# Patient Record
Sex: Female | Born: 1940 | Race: White | Hispanic: No | Marital: Married | Smoking: Never smoker
Health system: Southern US, Community
[De-identification: ages and names within clinical notes are randomized; demographics above are authoritative.]

## PROBLEM LIST (undated history)

## (undated) DIAGNOSIS — K648 Other hemorrhoids: Secondary | ICD-10-CM

## (undated) DIAGNOSIS — K219 Gastro-esophageal reflux disease without esophagitis: Secondary | ICD-10-CM

## (undated) DIAGNOSIS — G43909 Migraine, unspecified, not intractable, without status migrainosus: Secondary | ICD-10-CM

## (undated) DIAGNOSIS — I4819 Other persistent atrial fibrillation: Secondary | ICD-10-CM

## (undated) DIAGNOSIS — G4700 Insomnia, unspecified: Secondary | ICD-10-CM

## (undated) DIAGNOSIS — M5090 Cervical disc disorder, unspecified, unspecified cervical region: Secondary | ICD-10-CM

## (undated) DIAGNOSIS — K5790 Diverticulosis of intestine, part unspecified, without perforation or abscess without bleeding: Secondary | ICD-10-CM

## (undated) DIAGNOSIS — I639 Cerebral infarction, unspecified: Secondary | ICD-10-CM

## (undated) DIAGNOSIS — F329 Major depressive disorder, single episode, unspecified: Secondary | ICD-10-CM

## (undated) DIAGNOSIS — G4730 Sleep apnea, unspecified: Secondary | ICD-10-CM

## (undated) DIAGNOSIS — M353 Polymyalgia rheumatica: Secondary | ICD-10-CM

## (undated) DIAGNOSIS — E785 Hyperlipidemia, unspecified: Secondary | ICD-10-CM

## (undated) DIAGNOSIS — E2740 Unspecified adrenocortical insufficiency: Secondary | ICD-10-CM

## (undated) DIAGNOSIS — F32A Depression, unspecified: Secondary | ICD-10-CM

## (undated) DIAGNOSIS — D126 Benign neoplasm of colon, unspecified: Secondary | ICD-10-CM

## (undated) DIAGNOSIS — I10 Essential (primary) hypertension: Secondary | ICD-10-CM

## (undated) DIAGNOSIS — M1990 Unspecified osteoarthritis, unspecified site: Secondary | ICD-10-CM

## (undated) HISTORY — DX: Essential (primary) hypertension: I10

## (undated) HISTORY — PX: GLAUCOMA SURGERY: SHX656

## (undated) HISTORY — DX: Other hemorrhoids: K64.8

## (undated) HISTORY — DX: Migraine, unspecified, not intractable, without status migrainosus: G43.909

## (undated) HISTORY — PX: TONSILLECTOMY: SUR1361

## (undated) HISTORY — DX: Other persistent atrial fibrillation: I48.19

## (undated) HISTORY — DX: Unspecified adrenocortical insufficiency: E27.40

## (undated) HISTORY — DX: Cervical disc disorder, unspecified, unspecified cervical region: M50.90

## (undated) HISTORY — DX: Polymyalgia rheumatica: M35.3

## (undated) HISTORY — DX: Sleep apnea, unspecified: G47.30

## (undated) HISTORY — DX: Insomnia, unspecified: G47.00

## (undated) HISTORY — DX: Depression, unspecified: F32.A

## (undated) HISTORY — PX: BREAST LUMPECTOMY: SHX2

## (undated) HISTORY — DX: Unspecified osteoarthritis, unspecified site: M19.90

## (undated) HISTORY — DX: Gastro-esophageal reflux disease without esophagitis: K21.9

## (undated) HISTORY — DX: Diverticulosis of intestine, part unspecified, without perforation or abscess without bleeding: K57.90

## (undated) HISTORY — DX: Major depressive disorder, single episode, unspecified: F32.9

## (undated) HISTORY — DX: Benign neoplasm of colon, unspecified: D12.6

---

## 1998-06-25 ENCOUNTER — Ambulatory Visit (HOSPITAL_COMMUNITY): Admission: RE | Admit: 1998-06-25 | Discharge: 1998-06-25 | Payer: Self-pay | Admitting: Obstetrics & Gynecology

## 1999-09-13 ENCOUNTER — Other Ambulatory Visit: Admission: RE | Admit: 1999-09-13 | Discharge: 1999-09-13 | Payer: Self-pay | Admitting: Cardiology

## 2000-12-05 ENCOUNTER — Encounter: Payer: Self-pay | Admitting: Internal Medicine

## 2000-12-05 ENCOUNTER — Encounter: Admission: RE | Admit: 2000-12-05 | Discharge: 2000-12-05 | Payer: Self-pay | Admitting: Internal Medicine

## 2001-01-02 ENCOUNTER — Other Ambulatory Visit: Admission: RE | Admit: 2001-01-02 | Discharge: 2001-01-02 | Payer: Self-pay | Admitting: Internal Medicine

## 2002-08-15 ENCOUNTER — Ambulatory Visit (HOSPITAL_COMMUNITY): Admission: RE | Admit: 2002-08-15 | Discharge: 2002-08-15 | Payer: Self-pay | Admitting: Vascular Surgery

## 2002-08-15 ENCOUNTER — Encounter: Payer: Self-pay | Admitting: Vascular Surgery

## 2002-08-15 ENCOUNTER — Encounter (INDEPENDENT_AMBULATORY_CARE_PROVIDER_SITE_OTHER): Payer: Self-pay | Admitting: *Deleted

## 2003-02-13 ENCOUNTER — Encounter: Admission: RE | Admit: 2003-02-13 | Discharge: 2003-02-13 | Payer: Self-pay | Admitting: Internal Medicine

## 2003-02-13 ENCOUNTER — Encounter: Payer: Self-pay | Admitting: Internal Medicine

## 2005-09-19 ENCOUNTER — Encounter: Admission: RE | Admit: 2005-09-19 | Discharge: 2005-09-19 | Payer: Self-pay | Admitting: Rheumatology

## 2005-10-03 ENCOUNTER — Encounter: Admission: RE | Admit: 2005-10-03 | Discharge: 2005-10-03 | Payer: Self-pay | Admitting: Rheumatology

## 2007-03-27 ENCOUNTER — Ambulatory Visit: Payer: Self-pay | Admitting: Gastroenterology

## 2007-04-01 DIAGNOSIS — D126 Benign neoplasm of colon, unspecified: Secondary | ICD-10-CM

## 2007-04-01 HISTORY — DX: Benign neoplasm of colon, unspecified: D12.6

## 2007-04-10 ENCOUNTER — Ambulatory Visit: Payer: Self-pay | Admitting: Gastroenterology

## 2007-04-10 ENCOUNTER — Encounter: Payer: Self-pay | Admitting: Gastroenterology

## 2009-04-02 ENCOUNTER — Encounter: Admission: RE | Admit: 2009-04-02 | Discharge: 2009-04-02 | Payer: Self-pay | Admitting: Internal Medicine

## 2010-09-01 ENCOUNTER — Encounter: Admission: RE | Admit: 2010-09-01 | Discharge: 2010-09-01 | Payer: Self-pay | Admitting: Internal Medicine

## 2011-01-06 ENCOUNTER — Encounter: Payer: Self-pay | Admitting: Internal Medicine

## 2011-02-18 ENCOUNTER — Encounter: Payer: Self-pay | Admitting: Cardiology

## 2011-02-18 DIAGNOSIS — I10 Essential (primary) hypertension: Secondary | ICD-10-CM

## 2011-02-18 DIAGNOSIS — M353 Polymyalgia rheumatica: Secondary | ICD-10-CM

## 2011-02-23 ENCOUNTER — Encounter: Payer: Self-pay | Admitting: Cardiology

## 2011-02-23 ENCOUNTER — Ambulatory Visit (INDEPENDENT_AMBULATORY_CARE_PROVIDER_SITE_OTHER): Payer: Medicare Other | Admitting: Cardiology

## 2011-02-23 VITALS — BP 140/82 | HR 76 | Ht 63.0 in | Wt 126.8 lb

## 2011-02-23 DIAGNOSIS — I10 Essential (primary) hypertension: Secondary | ICD-10-CM

## 2011-02-23 DIAGNOSIS — R002 Palpitations: Secondary | ICD-10-CM

## 2011-02-23 NOTE — Assessment & Plan Note (Signed)
Symptoms of intermittent tachycardia. Need to rule out SVT or atrial fibrillation. She does history of PACs noted on prior stress testing the symptoms sound different. I've encouraged her to avoid stimulants. We will have her wear an event monitor and followup again in one month.

## 2011-02-23 NOTE — Assessment & Plan Note (Signed)
Blood pressure is well controlled currently. We'll continue with her current therapy. She is already on atenolol which should help her palpitations.

## 2011-02-23 NOTE — Progress Notes (Signed)
Lorraine Little Date of Birth: 07/28/1941   History of Present Illness: Lorraine Little is seen at the request of Dr. Waynard Edwards for evaluation of palpitations. She states the first time she noticed the symptoms she had been having a bad headache and was taking a lot of Excedrin. She stopped taking the Excedrin but continued to have symptoms of palpitations which she describes as her heart racing. Her blood pressures also been higher at this time. Since December these episodes have been sporadic occurring only about 4 times but lasting as long as 3 hours. She was started on HCTZ for her blood pressure and for swelling in her leg. Lorraine Little was evaluated here previously in 2009. She had a normal stress echo at that time.  Current Outpatient Prescriptions on File Prior to Visit  Medication Sig Dispense Refill  . alendronate (FOSAMAX) 70 MG tablet Take 70 mg by mouth every 7 (seven) days. Take with a full glass of water on an empty stomach.       Marland Kitchen aspirin 81 MG tablet Take 81 mg by mouth daily.        Marland Kitchen atenolol (TENORMIN) 50 MG tablet Take 50 mg by mouth daily.        . Calcium Carbonate-Vitamin D (CALCIUM + D) 600-200 MG-UNIT TABS Take by mouth.        . fish oil-omega-3 fatty acids 1000 MG capsule Take 1,200 mg by mouth 2 (two) times daily.       Marland Kitchen gabapentin (NEURONTIN) 100 MG capsule Take 100 mg by mouth 2 (two) times daily.        . predniSONE (DELTASONE) 2.5 MG tablet Take 4 mg by mouth daily.       . Sennosides (SENNA LAX PO) Take by mouth daily.        . temazepam (RESTORIL) 15 MG capsule Take 15 mg by mouth daily.        Marland Kitchen triamterene-hydrochlorothiazide (DYAZIDE) 37.5-25 MG per capsule Take 1 capsule by mouth every morning. Takes 1/2 tablet daily       . escitalopram (LEXAPRO) 5 MG tablet Take 5 mg by mouth daily.        . pantoprazole (PROTONIX) 40 MG tablet Take 40 mg by mouth daily.        . risedronate (ACTONEL) 150 MG tablet Take 150 mg by mouth every 30 (thirty) days. with water on  empty stomach, nothing by mouth or lie down for next 30 minutes.         No Known Allergies  Past Medical History  Diagnosis Date  . Polymyalgia rheumatica   . Hypertension   . GERD (gastroesophageal reflux disease)   . Premature atrial contraction   . Migraine headache   . Cervical disc disease   . Osteoporosis   . Glaucoma   . Osteoarthritis     Past Surgical History  Procedure Date  . Lympectomy 1980's    right  x2  . Tonsillectomy age 19    History  Smoking status  . Never Smoker   Smokeless tobacco  . Not on file    History  Alcohol Use  . Yes    SOCIAL    Family History  Problem Relation Age of Onset  . Stroke Mother   . Cancer Father     Review of Systems: The review of systems is positive for palpitations.  She has had mild left lower extremity edema. She denies any dizziness, syncope, shortness of breath, or chest  pain.All other systems were reviewed and are negative.  Physical Exam: BP 140/82  Pulse 76  Ht 5\' 3"  (1.6 m)  Wt 126 lb 12.8 oz (57.516 kg)  BMI 22.46 kg/m2 The patient is alert and oriented x 3.  The mood and affect are normal.  The skin is warm and dry.  Color is normal.  The HEENT exam reveals that the sclera are nonicteric.  The mucous membranes are moist.  The carotids are 2+ without bruits.  There is no thyromegaly.  There is no JVD.  The lungs are clear.  The chest wall is non tender.  The heart exam reveals a regular rate with a normal S1 and S2.  There are no murmurs, gallops, or rubs.  The PMI is not displaced.   Abdominal exam reveals good bowel sounds.  There is no guarding or rebound.  There is no hepatosplenomegaly or tenderness.  There are no masses.  Exam of the legs reveal no clubbing, cyanosis, or edema.  The legs are without rashes.  The distal pulses are intact.  Cranial nerves II - XII are intact.  Motor and sensory functions are intact.  The gait is normal.  LABORATORY DATA: Data January 06, 2011 her magnesium level,  chemistries, TSH, sedimentation rate, and CBC were normal. ECG demonstrated normal sinus rhythm with nonspecific T-wave abnormality.  Assessment / Plan:

## 2011-02-23 NOTE — Patient Instructions (Signed)
Continue your current medications.  We will have you wear an event monitor to see if we can identify any significant arrhytmia.  We will see you back again in 1 month.

## 2011-03-18 NOTE — Op Note (Signed)
   NAME:  Lorraine Little, Lorraine Little                        ACCOUNT NO.:  [id number]   MEDICAL RECORD NO.:  [id number]                   PATIENT TYPE:  OIB   LOCATION:  2867                                 FACILITY:  [facility]   PHYSICIAN:  Di Kindle. Edilia Bo, M.D.        DATE OF BIRTH:  Sep 21, 1941   DATE OF PROCEDURE:  08/15/2002  DATE OF DISCHARGE:                                 OPERATIVE REPORT   PREOPERATIVE DIAGNOSIS:  Headaches, rule out temporal arteritis.   POSTOPERATIVE DIAGNOSIS:  Headaches, rule out temporal arteritis.   PROCEDURE:  Bilateral temporal artery biopsy.   SURGEON:  Di Kindle. Edilia Bo, M.D.   ANESTHESIA:  Local with sedation.   DESCRIPTION OF PROCEDURE:  The patient was taken to the operating room and  sedated by anesthesia.  The temporal arteries were marked with the Doppler.  The forehead was then prepped and draped in the usual sterile fashion.  Attention was first turned to the biopsy on the right.  The skin was  anesthetized with 1% lidocaine and a small 2 cm transverse incision was  made.  The temporal artery was dissected free at both ends and an  approximately 4 cm segment was excised and the specimen sent to pathology.  Hemostasis was obtained in the wound.  The wound was closed with interrupted  3-0 Vicryl and then the skin closed with a 4-0 subcuticular stitch.  Next  attention was turned to the left-sided biopsy.  The skin was anesthetized  with 1% lidocaine and a transverse incision was made.  The temporal artery  was dissected out and an approximately 4 cm segment was excised and then  ligated at both ends.  Next this specimen was sent to pathology.  Hemostasis  was obtained in the wound.  The wound was closed with interrupted 3-0 Vicryl  and the skin closed with 4-0 Vicryl.  Dermabond was applied.  The patient  tolerated the procedure well and was transferred to the recovery room in  satisfactory condition.  All needle and sponge counts were  correct.                                               Di Kindle. Edilia Bo, M.D.    CSD/MEDQ  D:  08/15/2002  T:  08/17/2002  Job:  [id number]

## 2011-03-24 DIAGNOSIS — R002 Palpitations: Secondary | ICD-10-CM

## 2011-03-30 ENCOUNTER — Telehealth: Payer: Self-pay | Admitting: *Deleted

## 2011-03-30 NOTE — Telephone Encounter (Signed)
Notified of event monitor results. Will fax copy to Dr. Waynard Edwards

## 2011-04-19 ENCOUNTER — Encounter: Payer: Self-pay | Admitting: Cardiology

## 2011-07-15 ENCOUNTER — Inpatient Hospital Stay (HOSPITAL_COMMUNITY)
Admission: EM | Admit: 2011-07-15 | Discharge: 2011-07-17 | DRG: 310 | Disposition: A | Discharge status: Home / Self Care | Payer: Medicare Other | Source: Other Acute Inpatient Hospital | Attending: Cardiology | Admitting: Cardiology

## 2011-07-15 ENCOUNTER — Emergency Department (HOSPITAL_BASED_OUTPATIENT_CLINIC_OR_DEPARTMENT_OTHER)
Admission: EM | Admit: 2011-07-15 | Discharge: 2011-07-15 | Disposition: A | Discharge status: Other Acute Inpatient Hospital | Payer: Medicare Other | Source: Home / Self Care | Attending: Emergency Medicine | Admitting: Emergency Medicine

## 2011-07-15 ENCOUNTER — Emergency Department (INDEPENDENT_AMBULATORY_CARE_PROVIDER_SITE_OTHER): Payer: Medicare Other

## 2011-07-15 ENCOUNTER — Encounter (HOSPITAL_BASED_OUTPATIENT_CLINIC_OR_DEPARTMENT_OTHER): Payer: Self-pay | Admitting: *Deleted

## 2011-07-15 ENCOUNTER — Other Ambulatory Visit: Payer: Self-pay

## 2011-07-15 DIAGNOSIS — M810 Age-related osteoporosis without current pathological fracture: Secondary | ICD-10-CM | POA: Diagnosis present

## 2011-07-15 DIAGNOSIS — R0602 Shortness of breath: Secondary | ICD-10-CM

## 2011-07-15 DIAGNOSIS — K219 Gastro-esophageal reflux disease without esophagitis: Secondary | ICD-10-CM | POA: Diagnosis present

## 2011-07-15 DIAGNOSIS — I10 Essential (primary) hypertension: Secondary | ICD-10-CM | POA: Diagnosis present

## 2011-07-15 DIAGNOSIS — I959 Hypotension, unspecified: Secondary | ICD-10-CM

## 2011-07-15 DIAGNOSIS — R002 Palpitations: Secondary | ICD-10-CM

## 2011-07-15 DIAGNOSIS — I4891 Unspecified atrial fibrillation: Principal | ICD-10-CM | POA: Diagnosis present

## 2011-07-15 DIAGNOSIS — M1990 Unspecified osteoarthritis, unspecified site: Secondary | ICD-10-CM | POA: Diagnosis present

## 2011-07-15 DIAGNOSIS — Z7982 Long term (current) use of aspirin: Secondary | ICD-10-CM

## 2011-07-15 DIAGNOSIS — H409 Unspecified glaucoma: Secondary | ICD-10-CM | POA: Diagnosis present

## 2011-07-15 DIAGNOSIS — M353 Polymyalgia rheumatica: Secondary | ICD-10-CM | POA: Diagnosis present

## 2011-07-15 LAB — DIFFERENTIAL
Eosinophils Absolute: 0.1 10*3/uL (ref 0.0–0.7)
Lymphocytes Relative: 25 % (ref 12–46)
Lymphs Abs: 2.2 10*3/uL (ref 0.7–4.0)
Monocytes Relative: 10 % (ref 3–12)
Neutrophils Relative %: 64 % (ref 43–77)

## 2011-07-15 LAB — CBC
Hemoglobin: 14.8 g/dL (ref 12.0–15.0)
MCH: 30.6 pg (ref 26.0–34.0)
RBC: 4.83 MIL/uL (ref 3.87–5.11)
WBC: 8.7 10*3/uL (ref 4.0–10.5)

## 2011-07-15 LAB — BASIC METABOLIC PANEL
BUN: 16 mg/dL (ref 6–23)
CO2: 28 mEq/L (ref 19–32)
Chloride: 98 mEq/L (ref 96–112)
GFR calc non Af Amer: 55 mL/min — ABNORMAL LOW (ref >60)
Glucose, Bld: 105 mg/dL — ABNORMAL HIGH (ref 70–99)
Potassium: 4.1 mEq/L (ref 3.5–5.1)

## 2011-07-15 LAB — PROTIME-INR
INR: 0.97 (ref 0.00–1.49)
Prothrombin Time: 13.1 seconds (ref 11.6–15.2)

## 2011-07-15 LAB — T4, FREE: Free T4: 1.38 ng/dL (ref 0.80–1.80)

## 2011-07-15 LAB — TROPONIN I: Troponin I: <0.3 ng/mL (ref <0.30)

## 2011-07-15 MED ORDER — SODIUM CHLORIDE 0.9 % IV BOLUS (SEPSIS): 1000.0000 mL | Freq: Once | INTRAVENOUS | Status: DC

## 2011-07-15 MED ORDER — DILTIAZEM HCL 100 MG IV SOLR
5.0000 mg/h | INTRAVENOUS | Status: DC
  Administered 2011-07-15: 5 mg/h via INTRAVENOUS

## 2011-07-15 MED ORDER — SODIUM CHLORIDE 0.9 % IV SOLN: Freq: Once | INTRAVENOUS | Status: DC

## 2011-07-15 MED ORDER — DILTIAZEM HCL 100 MG IV SOLR
INTRAVENOUS | Status: AC
  Filled 2011-07-15: qty 100

## 2011-07-15 NOTE — ED Notes (Signed)
Called placed to [facility] cardiology.  Waiting return call

## 2011-07-15 NOTE — ED Notes (Signed)
Family at bedside. 

## 2011-07-15 NOTE — ED Notes (Signed)
Patient denies pain and is resting comfortably.  

## 2011-07-15 NOTE — ED Notes (Signed)
Feels like her heart is beating fast since this am. Denies pain. Hx of same. Wore a holter monitor in May but never had an episode. The next week she had an episode.

## 2011-07-15 NOTE — ED Notes (Signed)
Informed Dr. Brooke Dare of Pt. HR 79 with A Fib noted.  Dr. Brooke Dare reports that the Cardizem going at 3mg /hr.  Pt. B/P is 100/63

## 2011-07-15 NOTE — ED Notes (Signed)
Cardiac monitor showing A Fib and A Flutter with noted pauses in some rhythm strips.

## 2011-07-15 NOTE — ED Notes (Signed)
Pt. Reports no chest pain and is alert and oriented with  No distress noted.  Pt. Has cardizem gtt at 5mg /hr per EDP order.  Pt. Made aware she will be admitted to hosp.

## 2011-07-15 NOTE — ED Provider Notes (Signed)
History     CSN: [id number] Arrival date & time: 07/15/2011  6:39 PM   Chief Complaint  Patient presents with  . Palpitations     (Include location/radiation/quality/duration/timing/severity/associated sxs/prior treatment) HPI Comments: Patient awoke today with palpitations. Has a history of the same the past. Was evaluated by cardiology in April and wore a Holter monitor for the month of May but had no episodes. She states typically these episodes resolve on their own however this one has been constant for the entire day without resolution. She has associated shortness of breath but no chest pain.  Patient is a 70 y.o. female presenting with palpitations. The history is provided by the patient. No language interpreter was used.  Palpitations  This is a recurrent problem. The current episode started 12 to 24 hours ago. The problem occurs constantly. The problem has not changed since onset.The problem is associated with an unknown factor. Episode Length: has been constant since awakening this am. Associated symptoms include irregular heartbeat and shortness of breath. Pertinent negatives include no diaphoresis, no fever, no numbness, no chest pain, no chest pressure, no near-syncope, no orthopnea, no PND, no syncope, no abdominal pain, no nausea, no vomiting, no headaches, no back pain, no lower extremity edema, no dizziness, no weakness, no cough and no hemoptysis. She has tried nothing for the symptoms.     Past Medical History  Diagnosis Date  . Polymyalgia rheumatica   . Hypertension   . GERD (gastroesophageal reflux disease)   . Premature atrial contraction   . Migraine headache   . Cervical disc disease   . Osteoporosis   . Glaucoma   . Osteoarthritis      Past Surgical History  Procedure Date  . Lympectomy 1980's    right  x2  . Tonsillectomy age 16    Family History  Problem Relation Age of Onset  . Stroke Mother   . Cancer Father     History  Substance Use  Topics  . Smoking status: Never Smoker   . Smokeless tobacco: Not on file  . Alcohol Use: Yes     SOCIAL    OB History    Grav Para Term Preterm Abortions TAB SAB Ect Mult Living                  Review of Systems  Constitutional: Negative for fever, diaphoresis, activity change and appetite change.  HENT: Negative for congestion, sore throat, rhinorrhea, neck pain and neck stiffness.   Respiratory: Positive for shortness of breath. Negative for cough, hemoptysis and chest tightness.   Cardiovascular: Positive for palpitations. Negative for chest pain, orthopnea, syncope, PND and near-syncope.  Gastrointestinal: Negative for nausea, vomiting and abdominal pain.  Genitourinary: Negative for dysuria, urgency, frequency and flank pain.  Musculoskeletal: Negative for back pain.  Neurological: Negative for dizziness, syncope, weakness, light-headedness, numbness and headaches.  All other systems reviewed and are negative.    Allergies  Review of patient's allergies indicates no known allergies.  Home Medications   Current Outpatient Rx  Name Route Sig Dispense Refill  . ALENDRONATE SODIUM 70 MG PO TABS Oral Take 70 mg by mouth every 7 (seven) days. Take with a full glass of water on an empty stomach. Take on Wednesday    . ASPIRIN 81 MG PO TABS Oral Take 81 mg by mouth daily.      . ATENOLOL 50 MG PO TABS Oral Take 50 mg by mouth daily.      Marland Kitchen CALCIUM  CARBONATE-VITAMIN D 600-200 MG-UNIT PO TABS Oral Take 1 tablet by mouth 2 (two) times daily.     . OMEGA-3 FATTY ACIDS 1000 MG PO CAPS Oral Take 1 g by mouth 2 (two) times daily.     Marland Kitchen GABAPENTIN 100 MG PO CAPS Oral Take 200 mg by mouth 2 (two) times daily as needed.     Marland Kitchen HYDROCHLOROTHIAZIDE 50 MG PO TABS Oral Take 25-50 mg by mouth daily. Take 1/2 tab daily. When heat races take a whole tab     . PREDNISONE 5 MG PO TABS Oral Take 5 mg by mouth daily.      . SENNA LAX PO Oral Take 1 tablet by mouth at bedtime.     Marland Kitchen TEMAZEPAM 15 MG  PO CAPS Oral Take 15 mg by mouth daily.      Marland Kitchen ESCITALOPRAM OXALATE 5 MG PO TABS Oral Take 5 mg by mouth daily.      Marland Kitchen PANTOPRAZOLE SODIUM 40 MG PO TBEC Oral Take 40 mg by mouth daily.      Marland Kitchen PREDNISONE 2.5 MG PO TABS Oral Take 4 mg by mouth daily.     Marland Kitchen RISEDRONATE SODIUM 150 MG PO TABS Oral Take 150 mg by mouth every 30 (thirty) days. with water on empty stomach, nothing by mouth or lie down for next 30 minutes.     . TRIAMTERENE-HCTZ 37.5-25 MG PO CAPS Oral Take 1 capsule by mouth every morning. Takes 1/2 tablet daily       Physical Exam    BP 100/63  Pulse 79  Temp(Src) 99.4 F (37.4 C) (Oral)  Resp 18  SpO2 100%  Physical Exam  Nursing note and vitals reviewed. Constitutional: She is oriented to person, place, and time. She appears well-developed and well-nourished. No distress.  HENT:  Head: Normocephalic and atraumatic.  Mouth/Throat: Oropharynx is clear and moist. No oropharyngeal exudate.  Eyes: Conjunctivae and EOM are normal. Pupils are equal, round, and reactive to light.  Neck: Normal range of motion. Neck supple.  Cardiovascular: S1 normal, S2 normal and intact distal pulses.  An irregularly irregular rhythm present. Tachycardia present.  Exam reveals no gallop.   No murmur heard. Pulmonary/Chest: Effort normal and breath sounds normal. No respiratory distress.  Abdominal: Soft. Bowel sounds are normal. There is no tenderness.  Musculoskeletal: Normal range of motion. She exhibits no tenderness.  Neurological: She is alert and oriented to person, place, and time. She has normal reflexes.  Skin: Skin is warm and dry. No rash noted.    ED Course  Procedures  Results for orders placed during the hospital encounter of 07/15/11  CBC      Component Value Range   WBC 8.7  4.0 - 10.5 (K/uL)   RBC 4.83  3.87 - 5.11 (MIL/uL)   Hemoglobin 14.8  12.0 - 15.0 (g/dL)   HCT 16.1  09.6 - 04.5 (%)   MCV 88.8  78.0 - 100.0 (fL)   MCH 30.6  26.0 - 34.0 (pg)   MCHC 34.5  30.0 -  36.0 (g/dL)   RDW 40.9  81.1 - 91.4 (%)   Platelets 264  150 - 400 (K/uL)  DIFFERENTIAL      Component Value Range   Neutrophils Relative 64  43 - 77 (%)   Neutro Abs 5.6  1.7 - 7.7 (K/uL)   Lymphocytes Relative 25  12 - 46 (%)   Lymphs Abs 2.2  0.7 - 4.0 (K/uL)   Monocytes Relative 10  3 -  12 (%)   Monocytes Absolute 0.9  0.1 - 1.0 (K/uL)   Eosinophils Relative 1  0 - 5 (%)   Eosinophils Absolute 0.1  0.0 - 0.7 (K/uL)   Basophils Relative 0  0 - 1 (%)   Basophils Absolute 0.0  0.0 - 0.1 (K/uL)  BASIC METABOLIC PANEL      Component Value Range   Sodium 137  135 - 145 (mEq/L)   Potassium 4.1  3.5 - 5.1 (mEq/L)   Chloride 98  96 - 112 (mEq/L)   CO2 28  19 - 32 (mEq/L)   Glucose, Bld 105 (*) 70 - 99 (mg/dL)   BUN 16  6 - 23 (mg/dL)   Creatinine, Ser 1.61  0.50 - 1.10 (mg/dL)   Calcium 09.6  8.4 - 10.5 (mg/dL)   GFR calc non Af Amer 55 (*) >60 (mL/min)   GFR calc Af Amer >60  >60 (mL/min)  TROPONIN I      Component Value Range   Troponin I <0.30  <0.30 (ng/mL)  PROTIME-INR      Component Value Range   Prothrombin Time 13.1  11.6 - 15.2 (seconds)   INR 0.97  0.00 - 1.49    No results found.   Date: 07/15/2011  Rate: 125  Rhythm: atrial flutter  QRS Axis: right  Intervals: normal  ST/T Wave abnormalities: nonspecific ST changes  Conduction Disutrbances:none  Narrative Interpretation: aflutter v afib  Old EKG Reviewed: changes noted  1. Atrial fibrillation      MDM New-onset atrial for ablation with rate controlled by diltiazem. Laboratory studies and chest x-ray performed in the emergency Department unremarkable. EKG consistent with atrial flutter versus atrial fibrillation. She remained on the monitor one emergency department. I discussed the patient with both of our cardiology and offered medical or involved in the patient's overall care. It was determined that the patient will be admitted to the cardiology service as this is primarily a cardiology problem. She'll be  transferred to the step down unit at [facility].       Dayton Bailiff, MD 07/15/11 2105

## 2011-07-15 NOTE — ED Notes (Signed)
Report given to [facility] via phone  Pt. Going to Room 2605

## 2011-07-15 NOTE — ED Notes (Signed)
Pt. Started on Cardizem at 5mg /hr on pump.  Pt. In no distress at this time.  Dr. Brooke Dare aware of Pt. Rate going from 102-92.  Cont. cardizem and inform EDP of rate and B/P

## 2011-07-15 NOTE — ED Notes (Signed)
Report called to Gwynneth Albright RN

## 2011-07-16 DIAGNOSIS — I4891 Unspecified atrial fibrillation: Secondary | ICD-10-CM

## 2011-07-16 DIAGNOSIS — I059 Rheumatic mitral valve disease, unspecified: Secondary | ICD-10-CM

## 2011-07-16 LAB — BASIC METABOLIC PANEL
Calcium: 8.4 mg/dL (ref 8.4–10.5)
GFR calc Af Amer: >60 mL/min (ref >60)
GFR calc non Af Amer: >60 mL/min (ref >60)
Potassium: 3.4 mEq/L — ABNORMAL LOW (ref 3.5–5.1)
Sodium: 139 mEq/L (ref 135–145)

## 2011-07-16 LAB — PRO B NATRIURETIC PEPTIDE: Pro B Natriuretic peptide (BNP): 2392 pg/mL — ABNORMAL HIGH (ref 0–125)

## 2011-07-16 LAB — CBC
MCHC: 34.6 g/dL (ref 30.0–36.0)
Platelets: 218 10*3/uL (ref 150–400)
RDW: 13.3 % (ref 11.5–15.5)
WBC: 8.1 10*3/uL (ref 4.0–10.5)

## 2011-07-16 LAB — MRSA PCR SCREENING: MRSA by PCR: NEGATIVE

## 2011-07-17 LAB — BASIC METABOLIC PANEL
BUN: 11 mg/dL (ref 6–23)
Chloride: 107 mEq/L (ref 96–112)
GFR calc Af Amer: >60 mL/min (ref >60)
GFR calc non Af Amer: >60 mL/min (ref >60)
Potassium: 4.3 mEq/L (ref 3.5–5.1)

## 2011-07-17 LAB — PRO B NATRIURETIC PEPTIDE: Pro B Natriuretic peptide (BNP): 914.1 pg/mL — ABNORMAL HIGH (ref 0–125)

## 2011-07-17 NOTE — H&P (Signed)
NAMEJULICIA, Lorraine Little NO.:  [id number]  MEDICAL RECORD NO.:  [id number]  LOCATION:  2605                         FACILITY:  [facility]  PHYSICIAN:  Marca Ancona, MD      DATE OF BIRTH:  12/04/1940  DATE OF ADMISSION:  07/15/2011 DATE OF DISCHARGE:                             HISTORY & PHYSICAL   PRIMARY CARDIOLOGIST:  Peter M. Swaziland, MD  HISTORY OF PRESENT ILLNESS:  This is a 70 year old with history of hypertension and palpitations came to the emergency room tonight with atrial fibrillation.  The patient has had periodic episodes of tachy palpitations over the last few months.  For 3 hours in March, she felt her heart race.  She therefore was sent to see Dr. Swaziland, who did a 3- week event monitor on her that was unremarkable.  However, during the period she was monitored, she did not have any symptoms.  At the end of May after she had already finished the monitor, she had one full day where she felt her heart racing with a rate up to 130s.  She also had a day in August where she felt her heart racing with a rate up to 130s. She then woke up this morning with her heart racing.  When she went to bed Thursday night, she felt completely normal.  She had some shortness of breath with exertion during the day today.  She had no chest pain. She noted that her heart rate remained high during the day today. Therefore, she went for evaluation in the emergency room.  She was found to be in atrial fibrillation with a heart rate in the 130s.  She was started on diltiazem drip at 5 mg per hour and her heart rate decreased to the 70s.  She is no longer feeling palpitations.  MEDICATIONS: 1. Fosamax. 2. Aspirin 81 mg daily. 3. Atenolol 50 mg daily. 4. Gabapentin 100 mg b.i.d. 5. Prednisone 5 mg daily. 6. Triamterene/HCTZ 37.5/25 mg daily. 7. Protonix 40 mg daily.  PAST MEDICAL HISTORY: 1. Hypertension. 2. Palpitations.  The patient had a 3-week event monitor in May  that     showed no events. 3. Stress echo in 2009 was normal. 4. Polymyalgia rheumatica. 5. Gastroesophageal reflux disease. 6. Cervical disk disease. 7. Osteoporosis. 8. Glaucoma. 9. Osteoarthritis.  SOCIAL HISTORY:  The patient lives in [place] with her husband.  She occasionally drinks alcohol.  She has never smoked.  FAMILY HISTORY:  Mother had stroke.  Father had cancer.  REVIEW OF SYSTEMS:  All systems were reviewed and negative except as noted in history of present illness.  PHYSICAL EXAMINATION:  VITAL SIGNS:  The patient is afebrile, heart rate in the 70s and atrial fibrillation, blood pressure 122/66. GENERAL:  This is a well-developed female in no apparent distress. NEUROLOGIC:  Alert and oriented x3.  Normal affect. HEENT:  Normal exam. ABDOMEN:  Soft and nontender.  No hepatosplenomegaly.  Normal bowel sounds. EXTREMITIES:  No clubbing or cyanosis. NECK:  There is no thyromegaly or thyroid nodule.  There is no JVD. CARDIOVASCULAR:  Heart rate is irregular, S1 and S2.  No S3, no S4. There is no murmur.  There is  no carotid bruit.  There is no peripheral edema. EXTREMITIES:  No clubbing or cyanosis. LUNGS:  Clear to auscultation bilaterally with normal respiratory effort. MUSCULOSKELETAL:  Normal exam. SKIN:  Normal exam.  RADIOLOGY:  Chest x-ray shows bronchitic changes.  EKG shows atrial fibrillation at a rate of 125.  There are less than 1-mm inferior and anterolateral ST depression.  LABORATORY DATA:  White count 8.7, hematocrit 42.9, and platelets 246. Potassium 4.1 and creatinine 1.0.  TSH is normal.  Glucose 105. Troponin is less than 0.3.  IMPRESSION:  This is a 70 year old with history of hypertension who presents with an episode of atrial fibrillation with rapid ventricular response.  I do suspect the patient has had several episodes of atrial fibrillation dating back to March of this year based on her symptoms. Currently on diltiazem drip, her  heart rate is down into the 70s.  At this time, I am going to change her diltiazem.  I will take her off the diltiazem drip and give her dose of long-acting diltiazem at 120 mg daily.  She will continue on her home atenolol dose.  I will also give her magnesium sulfate 1 mg IV bolus to see if this can help convert her to sinus rhythm.  She will start Pradaxa 150 mg b.i.d.  First dose will be given now.  She does have a CHADS VASc score of 3 and we did talk about anticoagulation which I think she should probably continue long- term.  We will see how she does overnight.  If she remains in atrial fibrillation in the morning, I think that it would be reasonable try to get her out of atrial fibrillation as she will still be within the 48- hour window after onset of atrial fibrillation.  She went to bed Thursday asymptomatic and woke up Friday morning with palpitations.  She will get an echo hopefully early and if this shows that her heart is generally structurally normal, then I would give her a flecainide p.o. dose based on her weight 300 mg if her weight is greater than 70 kg, 200 mg if her weight is less than 70 kg.  This would be in the efforts to try to chemically cardiovert her.  If she converts with the flecainide bolus dose, I would continue her on flecainide at 50 mg b.i.d. and in that situation she would need an outpatient stress test to rule out ischemia, though she has no history of heart disease and does not have any history of ischemic symptoms.     Marca Ancona, MD     DM/MEDQ  D:  07/16/2011  T:  07/16/2011  Job:  [id number]  cc:   Peter M. Swaziland, M.D. Redge Gainer. Perini, M.D.  Electronically Signed by Marca Ancona MD on 07/17/2011 12:23:02 AM

## 2011-07-18 ENCOUNTER — Telehealth: Payer: Self-pay | Admitting: Cardiology

## 2011-07-18 DIAGNOSIS — I4891 Unspecified atrial fibrillation: Secondary | ICD-10-CM

## 2011-07-18 DIAGNOSIS — I119 Hypertensive heart disease without heart failure: Secondary | ICD-10-CM

## 2011-07-18 NOTE — Telephone Encounter (Signed)
Scheduled stress test and advised patient

## 2011-07-18 NOTE — Telephone Encounter (Signed)
Pt seen in hospital and was told by dr Patty Sermons to set up a stress test, need order

## 2011-07-20 ENCOUNTER — Encounter: Payer: Self-pay | Admitting: *Deleted

## 2011-07-22 NOTE — Discharge Summary (Signed)
Lorraine Little, Lorraine Little              ACCOUNT NO.:  [id number]  MEDICAL RECORD NO.:  [id number]  LOCATION:  2605                         FACILITY:  [facility]  PHYSICIAN:  Cassell Clement, M.D. DATE OF BIRTH:  Apr 12, 1941  DATE OF ADMISSION:  07/15/2011 DATE OF DISCHARGE:  07/17/2011                              DISCHARGE SUMMARY   PRIMARY CARDIOLOGIST:  Peter M. Swaziland, MD  PRIMARY CARE PROVIDER:  Redge Gainer. Perini, MD  DISCHARGE DIAGNOSIS:  Paroxysmal atrial fibrillation with rapid trigger response.  SECONDARY DIAGNOSES: 1. Hypertension. 2. Polymyalgia rheumatica. 3. Gastroesophageal reflux disease. 4. Cervical disk disease. 5. Osteoporosis. 6. Glaucoma. 7. Osteoarthritis.  ALLERGIES:  No known drug allergies.  PROCEDURES:  Two-D endocardiogram done on July 16, 2011.  EF 65- 70%.  Normal wall motion.  Mild MR.  HISTORY OF PRESENT ILLNESS:  This is a 70 year old female with prior history of hypertension, palpitations, previously evaluated by the event monitoring which in the past was unrevealing.  On the evening prior to admission, the patient noted tachy palpitations which persisted on the morning of admission associated with dyspnea on exertion.  She presented to [facility] ED where she was found to be in AFib with RVR with a rate of 130s.  She was placed on diltiazem infusion with reduction in heart rate and was subsequently admitted for further evaluation.  HOSPITAL COURSE:  On diltiazem infusion, the patient converted to sinus rhythm.  This was subsequently changed to oral diltiazem.  She was initially placed on heparin anticoagulation and subsequently transitioned to Pradaxa therapy.  Two-D echocardiogram was performed showing normal LV function.  We will plan to discharge the patient home today on continued beta-blocker and calcium channel blocker as well as Pradaxa.  We will arrange for an outpatient Myoview and subsequently follow up with Dr. Swaziland.  DISCHARGE  LABORATORY DATA:  Hemoglobin 13.3, hematocrit 38.4, WBC 8.1, and platelets 218.  Sodium 141, potassium 4.3, chloride 107, CO2 of 28, BUN 11, creatinine 0.83, glucose 90, and calcium 8.6.  BNP 914.1.  TSH 0.649, free T4 of 1.38.  MRSA screening is negative.  DISPOSITION:  The patient will be discharged home today in good condition.  FOLLOWUP PLANS AND APPOINTMENTS:  We will arrange for outpatient Myoview as well as follow up with Dr. Swaziland.  The patient will follow up with Dr. Waynard Edwards as previously scheduled.  DISCHARGE MEDICATIONS: 1. Dabigatran 150 mg q.12 h. 2. Diltiazem CD 120 mg daily. 3. Protonix 40 mg daily. 4. Potassium chloride 10 mEq daily. 5. Atenolol 50 mg daily. 6. Calcium plus D 1 tablet over the counter b.i.d. 7. Fish oil over the counter 1 capsule b.i.d. 8. Fosamax 70 mg q. week. 9. Gabapentin 100 mg 2 tablets at night and 1 tablet during the day if     needed for headache. 10.Prednisone 5 mg daily. 11.Restoril 15 mg q.p.m. 12.Senna 1 tablet q.p.m. 13.Triamterene and hydrochlorothiazide 37.5/25 half a tablet daily.  OUTSTANDING LABORATORY DATA AND STUDIES:  Followup Myoview in the outpatient setting.  DURATION OF DISCHARGE ENCOUNTER:  40 minutes including physician time.     Lorraine Little, ANP   ______________________________ Cassell Clement, M.D.    CB/MEDQ  D:  07/17/2011  T:  07/17/2011  Job:  [id number]  cc:   Loraine Leriche A. Perini, M.D.  Electronically Signed by Lorraine Little ANP on 07/21/2011 03:51:40 PM Electronically Signed by Cassell Clement M.D. on 07/22/2011 12:53:42 PM

## 2011-07-25 ENCOUNTER — Ambulatory Visit (HOSPITAL_COMMUNITY): Payer: Medicare Other | Attending: Cardiology | Admitting: Radiology

## 2011-07-25 ENCOUNTER — Telehealth: Payer: Self-pay | Admitting: Cardiology

## 2011-07-25 DIAGNOSIS — R0989 Other specified symptoms and signs involving the circulatory and respiratory systems: Secondary | ICD-10-CM

## 2011-07-25 DIAGNOSIS — I4949 Other premature depolarization: Secondary | ICD-10-CM

## 2011-07-25 DIAGNOSIS — I491 Atrial premature depolarization: Secondary | ICD-10-CM

## 2011-07-25 DIAGNOSIS — I119 Hypertensive heart disease without heart failure: Secondary | ICD-10-CM

## 2011-07-25 DIAGNOSIS — I4891 Unspecified atrial fibrillation: Secondary | ICD-10-CM

## 2011-07-25 MED ORDER — TECHNETIUM TC 99M TETROFOSMIN IV KIT
11.0000 | PACK | Freq: Once | INTRAVENOUS | Status: AC | PRN
  Administered 2011-07-25: 11 via INTRAVENOUS

## 2011-07-25 MED ORDER — TECHNETIUM TC 99M TETROFOSMIN IV KIT
33.0000 | PACK | Freq: Once | INTRAVENOUS | Status: AC | PRN
  Administered 2011-07-25: 33 via INTRAVENOUS

## 2011-07-25 NOTE — Progress Notes (Signed)
[facility] SITE 3 NUCLEAR MED [address] [place] [address] [phone]  Cardiology Nuclear Med Study  Lorraine Little is a 70 y.o. female [id number] 16-Jan-1941   Nuclear Med Background Indication for Stress Test:  Evaluation for Ischemia and 07/17/11 [facility] with afib, which converted with Diltiazem. History:  '06 GMW:NUUVOZ, EF=89%; '09 Stress Echo:Normal; 9/12 Echo:EF=65-70%, mild MR; H/O PAR Cardiac Risk Factors: Hypertension and Lipids  Symptoms:  DOE, Fatigue and Rapid HR   Nuclear Pre-Procedure Caffeine/Decaff Intake:  None NPO After: 8:30 am with small bowl of cereal   Lungs:  Clear.   IV 0.9% NS with Angio Cath:  22g  IV Site: R Hand  IV Started by:  Lorraine Parsons, RN  Chest Size (in):  34 Cup Size: C  Height: 5\' 3"  (1.6 m)  Weight:  126 lb (57.153 kg)  BMI:  Body mass index is 22.32 kg/(m^2). Tech Comments:  Atenolol held x 48 hours    Nuclear Med Study 1 or 2 day study: 1 day  Stress Test Type:  Stress  Reading MD: Lorraine Haws, MD  Order Authorizing Provider:  [name], MD; Lorraine Fredrickson, NP  Resting Radionuclide: Technetium 79m Tetrofosmin  Resting Radionuclide Dose: 11.0 mCi   Stress Radionuclide:  Technetium 59m Tetrofosmin  Stress Radionuclide Dose: 33.0 mCi           Stress Protocol Rest HR: 81 Stress HR: 169  Rest BP: 131/74 Stress BP: 189/78  Exercise Time (min): 6:30 METS: 7.7   Predicted Max HR: 148 bpm % Max HR: 114.19 bpm Rate Pressure Product: 36644   Dose of Adenosine (mg):  n/a Dose of Lexiscan: n/a mg  Dose of Atropine (mg): n/a Dose of Dobutamine: n/a mcg/kg/min (at max HR)  Stress Test Technologist: Lorraine Little, CMA-N  Nuclear Technologist:  Lorraine Little, CNMT     Rest Procedure:  Myocardial perfusion imaging was performed at rest 45 minutes following the intravenous administration of Technetium 29m Tetrofosmin.  Rest ECG: Nonspecific ST-T wave changes with occasional PAC's.  Stress  Procedure:  The patient exercised for 6:30 on the treadmill utilizing the Bruce protocol.  The patient stopped due to fatigue and dyspnea, with O2 SATs between 97 and 99% during exercise.  He denied any chest pain.  There were ST-T wave changes with exercise, occasional PAC's with a short burst of PAT and rare PVC.  Technetium 13m Tetrofosmin was injected at peak exercise and myocardial perfusion imaging was performed after a brief delay.  Stress ECG: Short burst SVT and 1mm lateral ST segment depression at peak stress and recovery  QPS Raw Data Images:  Normal; no motion artifact; normal heart/lung ratio. Stress Images:  Normal homogeneous uptake in all areas of the myocardium. Rest Images:  Normal homogeneous uptake in all areas of the myocardium. Subtraction (SDS):  Normal Transient Ischemic Dilatation (Normal <1.22):  0.70 Lung/Heart Ratio (Normal <0.45):  0.29  Quantitative Gated Spect Images QGS EDV:  42 ml QGS ESV:  9 ml QGS cine images:  NL LV Function; NL Wall Motion QGS EF: 79%  Impression Exercise Capacity:  Fair exercise capacity. BP Response:  Normal blood pressure response. Clinical Symptoms:  There is dyspnea. ECG Impression:  SVT and lateral ST segment depression in recovery Comparison with Prior Nuclear Study: No images to compare  Overall Impression:  Low risk study with normal EF and normal nuclear images.  Short burst of SVT during exercise and positive ECG response to exercise  Jenkins Rouge

## 2011-07-25 NOTE — Telephone Encounter (Signed)
Faxed 06' Nuc Stress w/pic today.

## 2011-07-25 NOTE — Telephone Encounter (Signed)
Correction:  Arline Asp called back to state it should be a Nuclear Stress Test around 06'.

## 2011-07-25 NOTE — Telephone Encounter (Signed)
Please fax over last treadmill stress test.

## 2011-07-27 ENCOUNTER — Telehealth: Payer: Self-pay | Admitting: *Deleted

## 2011-07-27 NOTE — Telephone Encounter (Signed)
Notified of stress test results. Wanted to know what to do about her Afib if reoccurs. She states someone told her to take an extra cardizem  If needed when has episode. Advised that would be OK but to call if reoccurs.

## 2011-07-27 NOTE — Telephone Encounter (Signed)
Message copied by Lorayne Bender on Wed Jul 27, 2011  9:36 AM ------      Message from: Sw[place], PETER M      Created: Tue Jul 26, 2011 11:52 AM       Normal nuclear stress test. EF is normal.      Peter Swaziland

## 2011-08-10 ENCOUNTER — Encounter: Payer: Self-pay | Admitting: Nurse Practitioner

## 2011-08-10 ENCOUNTER — Ambulatory Visit (INDEPENDENT_AMBULATORY_CARE_PROVIDER_SITE_OTHER): Payer: Medicare Other | Admitting: Nurse Practitioner

## 2011-08-10 VITALS — BP 110/68 | HR 67 | Ht 63.0 in | Wt 123.0 lb

## 2011-08-10 DIAGNOSIS — I480 Paroxysmal atrial fibrillation: Secondary | ICD-10-CM

## 2011-08-10 DIAGNOSIS — I4891 Unspecified atrial fibrillation: Secondary | ICD-10-CM

## 2011-08-10 DIAGNOSIS — I10 Essential (primary) hypertension: Secondary | ICD-10-CM

## 2011-08-10 MED ORDER — ASPIRIN EC 81 MG PO TBEC: 81.0000 mg | DELAYED_RELEASE_TABLET | Freq: Every day | ORAL | Status: DC

## 2011-08-10 NOTE — Assessment & Plan Note (Signed)
Blood pressure looks good today and she has good control at home. She will continue to monitor.

## 2011-08-10 NOTE — Assessment & Plan Note (Signed)
Her rhythm has been stable with her current regimen. She is tolerating her current regimen. I would like to see her back in 6 weeks to just check on her. Pradaxa is stopped today after discussion with Dr. Swaziland. She will finish her current supply and then restart a baby aspirin. If she has recurrence she will need long term anticoagulation. She does have awareness and symptoms with her arrhythmia. Patient is agreeable to this plan and will call if any problems develop in the interim.

## 2011-08-10 NOTE — Progress Notes (Signed)
Delfin Edis Date of Birth: 1940-11-15   History of Present Illness: Lorraine Little is seen today for a post hospital visit. She is seen for Dr. Swaziland. She had atrial fib with RVR about one month ago. She converted with Diltiazem. She's had a negative outpatient Myoview as well. Her echo in the hospital was basically normal. She is doing well and has had no recurrence of her atrial fib. She is tolerating her current regimen. She was symptomatic and has an awareness of when she goes out of rhythm. She is on Pradaxa. CHADs score is a 1 (for HTN). No history of stroke, diabetes and she is less than 65 years of age. She will have some transient lightheadedness but nothing that bothers her. She is mostly limited by her polymyalgia.    Current Outpatient Prescriptions on File Prior to Visit  Medication Sig Dispense Refill  . alendronate (FOSAMAX) 70 MG tablet Take 70 mg by mouth every 7 (seven) days. Take with a full glass of water on an empty stomach. Take on Wednesday      . atenolol (TENORMIN) 50 MG tablet Take 50 mg by mouth daily.        . Calcium Carbonate-Vitamin D (CALCIUM + D) 600-200 MG-UNIT TABS Take 1 tablet by mouth 2 (two) times daily.       Marland Kitchen diltiazem (CARDIZEM CD) 120 MG 24 hr capsule Take 120 mg by mouth daily.        . fish oil-omega-3 fatty acids 1000 MG capsule Take 1 g by mouth 2 (two) times daily.       Marland Kitchen gabapentin (NEURONTIN) 100 MG capsule Take 200 mg by mouth 2 (two) times daily as needed.       . pantoprazole (PROTONIX) 40 MG tablet Take 40 mg by mouth daily.        . predniSONE (DELTASONE) 5 MG tablet Take 5 mg by mouth daily.        . Sennosides (SENNA LAX PO) Take 1 tablet by mouth at bedtime.       . temazepam (RESTORIL) 15 MG capsule Take 15 mg by mouth daily.        Marland Kitchen triamterene-hydrochlorothiazide (DYAZIDE) 37.5-25 MG per capsule Take 1 capsule by mouth every morning. Takes 1/2 tablet daily       . DISCONTD: predniSONE (DELTASONE) 2.5 MG tablet Take 4 mg by  mouth daily.         No Known Allergies  Past Medical History  Diagnosis Date  . Polymyalgia rheumatica   . Hypertension   . GERD (gastroesophageal reflux disease)   . Premature atrial contraction   . Migraine headache   . Cervical disc disease   . Osteoporosis   . Glaucoma   . Osteoarthritis   . PAF (paroxysmal atrial fibrillation)     hospitalized with atrial fib with RVR in September of 2012  . Normal nuclear stress test Sept 2012  . Normal echocardiogram Sept 2012    mild MR with normal EF    Past Surgical History  Procedure Date  . Breast lumpectomy 1980's    right  x2  . Tonsillectomy age 1    History  Smoking status  . Never Smoker   Smokeless tobacco  . Not on file    History  Alcohol Use  . Yes    SOCIAL    Family History  Problem Relation Age of Onset  . Stroke Mother   . Cancer Father  Review of Systems: The review of systems is positive for generalized soreness. She does try to do water aerobics. No recurrent arrhythmia. No chest pain.  All other systems were reviewed and are negative.  Physical Exam: BP 110/68  Pulse 67  Ht 5\' 3"  (1.6 m)  Wt 123 lb (55.792 kg)  BMI 21.79 kg/m2 Patient is very pleasant and in no acute distress. Skin is warm and dry. Color is normal.  HEENT is unremarkable. Normocephalic/atraumatic. PERRL. Sclera are nonicteric. Neck is supple. No masses. No JVD. Lungs are clear. Cardiac exam shows a regular rate and rhythm. Abdomen is soft. Extremities are without edema. Gait and ROM are intact. No gross neurologic deficits noted.   LABORATORY DATA: EKG shows sinus rhythm today. Tracing was reviewed with Dr. Swaziland.    Assessment / Plan:

## 2011-08-10 NOTE — Patient Instructions (Addendum)
Stay on your current medicines except finish your Pradaxa and then restart your baby aspirin.   I will see you back in 6 weeks.   Call for any problems.

## 2011-08-23 ENCOUNTER — Telehealth: Payer: Self-pay | Admitting: Nurse Practitioner

## 2011-08-23 NOTE — Telephone Encounter (Signed)
Pt called she said she thinks she is in afib.  She wants to talk to you about what other med she can take.

## 2011-08-23 NOTE — Telephone Encounter (Signed)
Called stating she felt like she was in AFib this AM. Heart was pounding; felt light headed. States her HR was 98. She had just taken her medication. Wanted to know which one of her meds the Atenolol 50 mg or Diltiazem 120 mg she could take extra. States she feels like she is back in rhythm now- hr 78. Per Dr. Swaziland may take either medication as an extra dose. Advised that if not resolved she will need to call office. States she still feels a little dizzy now but HR is in 70's. Advised if this afternoon she still felt like HR irreg or fast could take an extra dose. Will call if has questions.

## 2011-08-24 ENCOUNTER — Telehealth: Payer: Self-pay | Admitting: Cardiology

## 2011-08-24 NOTE — Telephone Encounter (Signed)
Pt is a-fib and needs to know what to do

## 2011-08-24 NOTE — Telephone Encounter (Signed)
Called stating she has been in and out of Afib today. Took extra Diltiazem about 12:15 today when HR was 130. After "awhile" HR was down in 80's-90's. States heart just beats hard and fast. Per Dr. Swaziland can try taking Atenolol tomorrow if feels like in Afib. If doesn't resolve advised her to call office and the PA could see her. Dr. Swaziland states she may need an anti arrhyhtmic med. She will call if reoccurs.

## 2011-09-20 ENCOUNTER — Ambulatory Visit (INDEPENDENT_AMBULATORY_CARE_PROVIDER_SITE_OTHER): Payer: Medicare Other | Admitting: Nurse Practitioner

## 2011-09-20 ENCOUNTER — Encounter: Payer: Self-pay | Admitting: Nurse Practitioner

## 2011-09-20 VITALS — BP 116/70 | HR 71 | Ht 63.0 in | Wt 127.0 lb

## 2011-09-20 DIAGNOSIS — I4891 Unspecified atrial fibrillation: Secondary | ICD-10-CM

## 2011-09-20 DIAGNOSIS — I10 Essential (primary) hypertension: Secondary | ICD-10-CM

## 2011-09-20 DIAGNOSIS — I480 Paroxysmal atrial fibrillation: Secondary | ICD-10-CM

## 2011-09-20 MED ORDER — FLECAINIDE ACETATE 100 MG PO TABS: ORAL_TABLET | ORAL | Status: DC

## 2011-09-20 NOTE — Assessment & Plan Note (Signed)
Blood pressure looks good. No change in her medicines.

## 2011-09-20 NOTE — Patient Instructions (Addendum)
Stay on your current medicines.   I will talk to Dr. Swaziland regarding your medicines.  We will see you back in 6 months.   Call the [facility] office at [phone] if you have any questions, problems or concerns.

## 2011-09-20 NOTE — Assessment & Plan Note (Addendum)
She is in sinus today. No recurrent spells over the last 3 weeks. I will talk with Dr. Swaziland. Maybe she is a candidate for Flecainide. Further disposition to follow. We will tentatively see her back in 6 months. Patient is agreeable to this plan and will call if any problems develop in the interim.   I have discussed her case with Dr. Swaziland. We will give her 200mg  of Tambocor as a "pill in the pocket" to use only prn. She will call and report her spells to [place]. I have spoken to her by phone this afternoon.

## 2011-09-20 NOTE — Progress Notes (Signed)
Lorraine Little Date of Birth: 10/30/1941 Medical Record #[id number]  History of Present Illness: Lorraine Little is seen today for her 6 week follow up visit. She is seen for Dr. Swaziland. She has had PAF. She had negative outpatient myoview. Echo was normal. She was put back on aspirin therapy at her last visit and Pradaxa was stopped. CHADs score is 1 (for HTN).   She called in on the 24th of October with recurrent atrial fib. She took an extra Diltiazem. Dr. Swaziland expressed concern that she may need antiarrhythmic therapy. She reports that she was in and out of rhythm on the 23rd, 24th, 26th and 27th. No problems since then. She took extra Diltiazem for 3 of the days and Atenolol once. She got a better response with the Diltiazem. No chest pain. She feels good. She remains active.   Current Outpatient Prescriptions on File Prior to Visit  Medication Sig Dispense Refill  . alendronate (FOSAMAX) 70 MG tablet Take 70 mg by mouth every 7 (seven) days. Take with a full glass of water on an empty stomach. Take on Wednesday      . aspirin EC 81 MG tablet Take 1 tablet (81 mg total) by mouth daily.  150 tablet  2  . atenolol (TENORMIN) 50 MG tablet Take 50 mg by mouth daily.        . Calcium Carbonate-Vitamin D (CALCIUM + D) 600-200 MG-UNIT TABS Take 1 tablet by mouth 2 (two) times daily.       Marland Kitchen diltiazem (CARDIZEM CD) 120 MG 24 hr capsule Take 120 mg by mouth daily.        . fish oil-omega-3 fatty acids 1000 MG capsule Take 1 g by mouth 2 (two) times daily.       Marland Kitchen gabapentin (NEURONTIN) 100 MG capsule Take 200 mg by mouth 2 (two) times daily as needed.       . pantoprazole (PROTONIX) 40 MG tablet Take 40 mg by mouth daily.        . predniSONE (DELTASONE) 5 MG tablet Take 5 mg by mouth daily.        . Sennosides (SENNA LAX PO) Take 1 tablet by mouth at bedtime.       . temazepam (RESTORIL) 15 MG capsule Take 15 mg by mouth daily.        Marland Kitchen triamterene-hydrochlorothiazide (DYAZIDE) 37.5-25 MG per  capsule Take 1 capsule by mouth every morning. Takes 1/2 tablet daily         No Known Allergies  Past Medical History  Diagnosis Date  . Polymyalgia rheumatica   . Hypertension   . GERD (gastroesophageal reflux disease)   . Premature atrial contraction   . Migraine headache   . Cervical disc disease   . Osteoporosis   . Glaucoma   . Osteoarthritis   . PAF (paroxysmal atrial fibrillation)     hospitalized with atrial fib with RVR in September of 2012  . Normal nuclear stress test Sept 2012  . Normal echocardiogram Sept 2012    mild MR with normal EF    Past Surgical History  Procedure Date  . Breast lumpectomy 1980's    right  x2  . Tonsillectomy age 47    History  Smoking status  . Never Smoker   Smokeless tobacco  . Not on file    History  Alcohol Use  . Yes    SOCIAL    Family History  Problem Relation Age of Onset  .  Stroke Mother   . Cancer Father     Review of Systems: The review of systems is per the HPI.  All other systems were reviewed and are negative.  Physical Exam: Ht 5\' 3"  (1.6 m)  Wt 127 lb (57.607 kg)  BMI 22.50 kg/m2 Patient is very pleasant and in no acute distress. Skin is warm and dry. Color is normal.  HEENT is unremarkable. Normocephalic/atraumatic. PERRL. Sclera are nonicteric. Neck is supple. No masses. No JVD. Lungs are clear. Cardiac exam shows a regular rate and rhythm. Abdomen is soft. Extremities are without edema. Gait and ROM are intact. No gross neurologic deficits noted.   LABORATORY DATA: EKG today shows sinus rhythm.  Assessment / Plan:

## 2011-09-20 NOTE — Progress Notes (Signed)
Addended by: Rosalio Macadamia on: 09/20/2011 02:33 PM   Modules accepted: Orders

## 2011-11-10 ENCOUNTER — Emergency Department (HOSPITAL_COMMUNITY)
Admission: EM | Admit: 2011-11-10 | Discharge: 2011-11-10 | Disposition: A | Discharge status: Home / Self Care | Payer: Medicare Other | Attending: Emergency Medicine | Admitting: Emergency Medicine

## 2011-11-10 ENCOUNTER — Emergency Department (HOSPITAL_COMMUNITY): Payer: Medicare Other

## 2011-11-10 ENCOUNTER — Encounter (HOSPITAL_COMMUNITY): Payer: Self-pay | Admitting: *Deleted

## 2011-11-10 ENCOUNTER — Other Ambulatory Visit: Payer: Self-pay

## 2011-11-10 DIAGNOSIS — Z79899 Other long term (current) drug therapy: Secondary | ICD-10-CM | POA: Insufficient documentation

## 2011-11-10 DIAGNOSIS — R55 Syncope and collapse: Secondary | ICD-10-CM | POA: Insufficient documentation

## 2011-11-10 DIAGNOSIS — S43016A Anterior dislocation of unspecified humerus, initial encounter: Secondary | ICD-10-CM | POA: Insufficient documentation

## 2011-11-10 DIAGNOSIS — S43004A Unspecified dislocation of right shoulder joint, initial encounter: Secondary | ICD-10-CM

## 2011-11-10 DIAGNOSIS — H409 Unspecified glaucoma: Secondary | ICD-10-CM | POA: Insufficient documentation

## 2011-11-10 DIAGNOSIS — R296 Repeated falls: Secondary | ICD-10-CM | POA: Insufficient documentation

## 2011-11-10 DIAGNOSIS — M1990 Unspecified osteoarthritis, unspecified site: Secondary | ICD-10-CM | POA: Insufficient documentation

## 2011-11-10 DIAGNOSIS — I4891 Unspecified atrial fibrillation: Secondary | ICD-10-CM | POA: Insufficient documentation

## 2011-11-10 DIAGNOSIS — I10 Essential (primary) hypertension: Secondary | ICD-10-CM | POA: Insufficient documentation

## 2011-11-10 DIAGNOSIS — Z7982 Long term (current) use of aspirin: Secondary | ICD-10-CM | POA: Insufficient documentation

## 2011-11-10 DIAGNOSIS — M810 Age-related osteoporosis without current pathological fracture: Secondary | ICD-10-CM | POA: Insufficient documentation

## 2011-11-10 DIAGNOSIS — K219 Gastro-esophageal reflux disease without esophagitis: Secondary | ICD-10-CM | POA: Insufficient documentation

## 2011-11-10 MED ORDER — FENTANYL CITRATE 0.05 MG/ML IJ SOLN
100.0000 ug | Freq: Once | INTRAMUSCULAR | Status: AC
  Administered 2011-11-10: 25 ug via INTRAVENOUS
  Filled 2011-11-10: qty 2

## 2011-11-10 MED ORDER — ETOMIDATE 2 MG/ML IV SOLN
0.2000 mg/kg | Freq: Once | INTRAVENOUS | Status: DC
  Administered 2011-11-10: 8 mg via INTRAVENOUS

## 2011-11-10 MED ORDER — MORPHINE SULFATE 4 MG/ML IJ SOLN: 4.0000 mg | Freq: Once | INTRAMUSCULAR | Status: DC

## 2011-11-10 MED ORDER — FENTANYL CITRATE 0.05 MG/ML IJ SOLN
50.0000 ug | Freq: Once | INTRAMUSCULAR | Status: AC
  Administered 2011-11-10: 50 ug via INTRAVENOUS
  Filled 2011-11-10: qty 2

## 2011-11-10 MED ORDER — ETOMIDATE 2 MG/ML IV SOLN
INTRAVENOUS | Status: AC
  Administered 2011-11-10: 8 mg via INTRAVENOUS
  Filled 2011-11-10: qty 10

## 2011-11-10 NOTE — ED Notes (Signed)
Patient transported to X-ray 

## 2011-11-10 NOTE — ED Provider Notes (Signed)
History     CSN: [id number]  Arrival date & time 11/10/11  1349   First MD Initiated Contact with Patient 11/10/11 1419      Chief Complaint  Patient presents with  . Loss of Consciousness    (Consider location/radiation/quality/duration/timing/severity/associated sxs/prior treatment) Patient is a 71 y.o. female presenting with syncope. The history is provided by the patient. No language interpreter was used.  Loss of Consciousness This is a new problem. The current episode started today. Episode frequency: Once. The problem has been resolved. Pertinent negatives include no abdominal pain, chest pain, chills, coughing, diaphoresis, fever, nausea, numbness or vomiting. The symptoms are aggravated by nothing. She has tried nothing for the symptoms. The treatment provided no relief.    Past Medical History  Diagnosis Date  . Polymyalgia rheumatica   . Hypertension   . GERD (gastroesophageal reflux disease)   . Premature atrial contraction   . Migraine headache   . Cervical disc disease   . Osteoporosis   . Glaucoma   . Osteoarthritis   . PAF (paroxysmal atrial fibrillation)     hospitalized with atrial fib with RVR in September of 2012  . Normal nuclear stress test Sept 2012  . Normal echocardiogram Sept 2012    mild MR with normal EF    Past Surgical History  Procedure Date  . Breast lumpectomy 1980's    right  x2  . Tonsillectomy age 67    Family History  Problem Relation Age of Onset  . Stroke Mother   . Cancer Father     History  Substance Use Topics  . Smoking status: Never Smoker   . Smokeless tobacco: Not on file  . Alcohol Use: Yes     SOCIAL    OB History    Grav Para Term Preterm Abortions TAB SAB Ect Mult Living                  Review of Systems  Constitutional: Negative for fever, chills and diaphoresis.  Respiratory: Negative for cough, chest tightness, shortness of breath and wheezing.   Cardiovascular: Positive for syncope. Negative for  chest pain.  Gastrointestinal: Negative for nausea, vomiting and abdominal pain.  Neurological: Negative for numbness.  All other systems reviewed and are negative.    Allergies  Review of patient's allergies indicates no known allergies.  Home Medications   Current Outpatient Rx  Name Route Sig Dispense Refill  . ALENDRONATE SODIUM 70 MG PO TABS Oral Take 70 mg by mouth every 7 (seven) days. Take with a full glass of water on an empty stomach. Take on Wednesday    . ASPIRIN EC 81 MG PO TBEC Oral Take 81 mg by mouth daily.    . ATENOLOL 50 MG PO TABS Oral Take 50 mg by mouth daily.     Marland Kitchen CALCIUM CARBONATE-VITAMIN D 600-200 MG-UNIT PO TABS Oral Take 1 tablet by mouth 2 (two) times daily.     Marland Kitchen DILTIAZEM HCL ER COATED BEADS 120 MG PO CP24 Oral Take 120 mg by mouth daily.     . OMEGA-3 FATTY ACIDS 1000 MG PO CAPS Oral Take 1 g by mouth 2 (two) times daily.     Marland Kitchen FLECAINIDE ACETATE 100 MG PO TABS Oral Take 200 mg by mouth daily as needed. At onset of atrial fib    . GABAPENTIN 100 MG PO CAPS Oral Take 100-200 mg by mouth 2 (two) times daily. Take 100mg  at noon daily and take 200mg  every  night at bedtime    . PANTOPRAZOLE SODIUM 40 MG PO TBEC Oral Take 40 mg by mouth daily.     Marland Kitchen POTASSIUM CHLORIDE CRYS ER 10 MEQ PO TBCR Oral Take 10 mEq by mouth daily.    Marland Kitchen PREDNISONE 5 MG PO TABS Oral Take 5 mg by mouth daily.     . SENNA LAX PO Oral Take 1 tablet by mouth at bedtime.     Marland Kitchen TEMAZEPAM 15 MG PO CAPS Oral Take 15 mg by mouth at bedtime.     . TRIAMTERENE-HCTZ 37.5-25 MG PO CAPS  0.5 capsules every morning. Takes 1/2 tablet daily    . VITAMIN D (ERGOCALCIFEROL) 50000 UNITS PO CAPS Oral Take 50,000 Units by mouth every 14 (fourteen) days.      BP 101/40  Pulse 68  Temp(Src) 97.9 F (36.6 C) (Oral)  Resp 16  SpO2 100%  Physical Exam  Nursing note and vitals reviewed. Constitutional: She is oriented to person, place, and time. She appears well-developed and well-nourished. No distress.    HENT:  Head: Normocephalic and atraumatic.  Eyes: EOM are normal. Pupils are equal, round, and reactive to light.  Neck: Normal range of motion. Neck supple.  Cardiovascular: Normal rate and regular rhythm.  Exam reveals no friction rub.   No murmur heard. Pulmonary/Chest: Effort normal and breath sounds normal. No respiratory distress. She has no wheezes. She has no rales.  Abdominal: Soft. She exhibits no distension. There is no tenderness. There is no rebound.  Musculoskeletal: She exhibits no edema.       Right shoulder: She exhibits decreased range of motion (2/2 pain), tenderness and bony tenderness. She exhibits normal pulse and normal strength.  Neurological: She is alert and oriented to person, place, and time.  Skin: She is not diaphoretic.    ED Course  Procedural sedation Date/Time: 11/10/2011 4:43 PM Performed by: Elwin Mocha Authorized by: Preston Fleeting, DAVID Consent: Verbal consent obtained. Written consent obtained. Risks and benefits: risks, benefits and alternatives were discussed Consent given by: patient Patient understanding: patient states understanding of the procedure being performed Patient consent: the patient's understanding of the procedure matches consent given Patient identity confirmed: verbally with patient and arm band Time out: Immediately prior to procedure a "time out" was called to verify the correct patient, procedure, equipment, support staff and site/side marked as required. Local anesthesia used: no Patient sedated: yes Sedation type: moderate (conscious) sedation Sedatives: etomidate Analgesia: fentanyl Vitals: Vital signs were monitored during sedation. Patient tolerance: Patient tolerated the procedure well with no immediate complications. Comments: Sedation time - 15 minutes  ORTHOPEDIC INJURY TREATMENT Date/Time: 11/10/2011 4:44 PM Performed by: Elwin Mocha Authorized by: Preston Fleeting, DAVID Consent: Verbal consent obtained. Written consent  obtained. Risks and benefits: risks, benefits and alternatives were discussed Consent given by: patient Patient understanding: patient states understanding of the procedure being performed Patient consent: the patient's understanding of the procedure matches consent given Patient identity confirmed: verbally with patient and arm band Time out: Immediately prior to procedure a "time out" was called to verify the correct patient, procedure, equipment, support staff and site/side marked as required. Injury location: shoulder Location details: right shoulder Injury type: dislocation Dislocation type: inferior Hill-Sachs deformity: noChronicity: new Pre-procedure neurovascular assessment: neurovascularly intact Pre-procedure distal perfusion: normal Pre-procedure neurological function: normal Pre-procedure range of motion: reduced Local anesthesia used: no Patient sedated: yes Sedation type: moderate (conscious) sedation Sedatives: etomidate Analgesia: fentanyl Vitals: Vital signs were monitored during sedation. Manipulation performed: yes Reduction method:  external rotation and traction and counter traction Reduction successful: yes X-ray confirmed reduction: yes Immobilization: sling Splint type: Shoulder immobilzer. Post-procedure neurovascular assessment: post-procedure neurovascularly intact Post-procedure distal perfusion: normal Post-procedure neurological function: normal Post-procedure range of motion: improved Patient tolerance: Patient tolerated the procedure well with no immediate complications.   (including critical care time)  Labs Reviewed - No data to display No results found.   1. Dislocation of right shoulder joint   2. Syncope       MDM  Patient is a 71 year old female who presents with a syncopal episode and right shoulder pain. She has a history of A. fib attacks. This morning she went into A. fib and took her flecainide. She stood up to check her pulse  the bathroom. While she was standing after walking several feet she felt herself become dizzy like she was going to black out. She tried to go down to her knees however was unable to do so. She her right shoulder while falling. She states she feels like her right shoulder is dislocated. On arrival, patient is in normal sinus rhythm with a rate in the 60s. Patient's blood pressure is in the low 100s. Patient's right shoulder is abducted to 90 as a position of comfort. Her right arm is neurovascularly intact. On EKG, patient appears to be back in normal sinus rhythm. She has other signs of injury. No neck deformities or tenderness. No head injuries at all. Patient is neurovascularly intact in all Shoney's. Patient's chest pain or, dyspnea, dizziness now. She reported no chest pain dyspnea, fever, cough prior to her syncopal episode. Pain meds and an x-ray for her right shoulder. Patient's syncope appears to be orthostatic since it was related to her recent standing up. With a sinus rhythm EKG and no other complaints at this time, no further testing is warranted. Patient is neuro intact and does not need head CT.  R shoulder dislocation reduced under procedural sedation. X-rays show successful reduction. Discharged home in stable condition.   Elwin Mocha, MD 11/10/11 [phone]

## 2011-11-10 NOTE — ED Notes (Signed)
Patient states that she was sitting in her kitchen eating and felt herself go into A-Fib.  States that she took all of her AM medications as well as her medication for A-Fib. Patient states that she went into the bathroom and became dizzy. States that she went down on her knees and passed out.  She C/O pain in her right shoulder and states that it feels like it is dislocated or broken.  NSR at a rate of 68 noted on the monitor. Patient denies pain or injury anywhere else.

## 2011-11-10 NOTE — Progress Notes (Signed)
Orthopedic Tech Progress Note Patient Details:  Lorraine Little 1941/10/01 [id number]  Other Ortho Devices Type of Ortho Device: Other (comment) (sling immobilizer) Ortho Device Location: right arm Ortho Device Interventions: Application  VIEWED ORDER FROM DR. Tyson Alias ORDER LIST AS IT  DID NOT TRANSITION TO ORTHO LIST   [name] 11/10/2011, 5:07 PM

## 2011-11-10 NOTE — ED Notes (Signed)
Patient states that at 0900 she felt like she was in A-Fib so she took her meds.  States that she got up to go to  The bathroom. She became dizzy, went down on her knees and blacked out.

## 2011-11-10 NOTE — ED Notes (Signed)
Patient returned from Radiology. 

## 2011-11-10 NOTE — ED Provider Notes (Signed)
I was present for, and supervised, the procedural sedation and reduction of shoulder dislocation procedure.  Dione Booze, MD 11/10/11 [phone]

## 2011-11-10 NOTE — ED Provider Notes (Signed)
I saw and evaluated the patient, reviewed the resident's note and I agree with the findings and plan. 71 year old, female, with a history of atrial fibrillation was walking across the floor.  She became lightheaded, and slumped down to the ground, extending her arms to prevent a hard fall.  She complains of right shoulder pain and inability to move it.  She did not hit her head.  She denied nausea, vomiting, or chest pain, per prior to this incident.  She has not had recent illnesses.  Her last meal was at 8:30 this morning.  She is right-hand dominant.  She has a right shoulder dislocation, which we will reduce with procedural sedation.  The  Nicholes Stairs, MD 11/10/11 [phone]

## 2011-11-14 ENCOUNTER — Telehealth: Payer: Self-pay | Admitting: Cardiology

## 2011-11-14 NOTE — Telephone Encounter (Signed)
New Msg; pt calling wanting to discuss with nurse/MD about pt recent hospital admission. Please return pt call to discuss further.

## 2011-11-15 MED ORDER — FLECAINIDE ACETATE 100 MG PO TABS: ORAL_TABLET | ORAL | Status: DC

## 2011-11-15 NOTE — Telephone Encounter (Signed)
Patient called, stated she had a episode of AFib last thurs.11/10/11.States she took flecainide 100mg  2 tablets,atenolol 50 mg,cardizem 120 mg.States she blacked out,was taken to [place] [facility].States she hurt her rt shoulder and she is seeing Dr. Teressa Senter.Spoke with Dr. [place] he advised not to take 200 mg of flecainide next time,take 100mg .Also don't take all medication at same time.May take flecainide 100mg  1 tablet prn onset of AFib.

## 2011-11-15 NOTE — ED Provider Notes (Signed)
I saw and evaluated the patient, reviewed the resident's note and I agree with the findings and plan.  Nicholes Stairs, MD 11/15/11 [phone]

## 2011-12-19 ENCOUNTER — Telehealth: Payer: Self-pay | Admitting: Cardiology

## 2011-12-19 NOTE — Telephone Encounter (Signed)
Patient states has taken the Flacainide 100 mg at 1100 AM (she is to take at onset of A-Fib) and still is out of rhythm rate ranges from the 90's to low 100's beats/ minute. Pt would like to Know what other medication she needs to take. She has taken the Cardizem CD 120 mg and Atenolol 50 mg this AM.  Pt states her heart  has been out or rhythm for 7 hours. Patient is SOB  With activity only.

## 2011-12-19 NOTE — Telephone Encounter (Signed)
Patient states is in A -fib  Rate jumps from 60,64 to 100 beats/ minute, sometimes 120 beats she has a finger prove which it counts by seconds. Now HR is 75 beats per minute. Patient denies any other symptoms. She took atenolol and Cardizem about 0900 AM she would like to know what other medication she needs to take. Patient is aware to take only one 100 mg flecainide tablet on  the onset of A-Fibrillation. She is to wait about an hour from now to take the Flecainide  make sure she  only take one 100 mg tablet. Patient verbalized understanding. Pt will call back if needed.

## 2011-12-19 NOTE — Telephone Encounter (Signed)
Dr. Riley Kill DOD recommends for pt not to repeat the medication , she is to go to the ER to see if can be asses her Rate and rhythm to see if  She  can be cardioverted. Patient understand these recommendations, but she would like to think about this before going to the ER. She is aware to call it any questions.

## 2011-12-19 NOTE — Telephone Encounter (Signed)
Pt is in a-fib, has question re what med to take, pls call

## 2011-12-19 NOTE — Telephone Encounter (Signed)
F/U  Patient returning nurse call, she can be reached at hm# [phone]

## 2011-12-20 ENCOUNTER — Emergency Department (HOSPITAL_COMMUNITY): Payer: Medicare Other

## 2011-12-20 ENCOUNTER — Inpatient Hospital Stay (HOSPITAL_COMMUNITY)
Admission: EM | Admit: 2011-12-20 | Discharge: 2011-12-22 | DRG: 066 | Disposition: A | Discharge status: Home / Self Care | Payer: Medicare Other | Source: Ambulatory Visit | Attending: Internal Medicine | Admitting: Internal Medicine

## 2011-12-20 ENCOUNTER — Encounter (HOSPITAL_COMMUNITY): Payer: Self-pay | Admitting: Emergency Medicine

## 2011-12-20 DIAGNOSIS — I4891 Unspecified atrial fibrillation: Secondary | ICD-10-CM | POA: Diagnosis present

## 2011-12-20 DIAGNOSIS — IMO0002 Reserved for concepts with insufficient information to code with codable children: Secondary | ICD-10-CM

## 2011-12-20 DIAGNOSIS — I6350 Cerebral infarction due to unspecified occlusion or stenosis of unspecified cerebral artery: Principal | ICD-10-CM | POA: Diagnosis present

## 2011-12-20 DIAGNOSIS — G459 Transient cerebral ischemic attack, unspecified: Secondary | ICD-10-CM

## 2011-12-20 DIAGNOSIS — I10 Essential (primary) hypertension: Secondary | ICD-10-CM | POA: Diagnosis present

## 2011-12-20 DIAGNOSIS — Z823 Family history of stroke: Secondary | ICD-10-CM

## 2011-12-20 DIAGNOSIS — M353 Polymyalgia rheumatica: Secondary | ICD-10-CM | POA: Diagnosis present

## 2011-12-20 DIAGNOSIS — R29810 Facial weakness: Secondary | ICD-10-CM | POA: Diagnosis present

## 2011-12-20 DIAGNOSIS — I639 Cerebral infarction, unspecified: Secondary | ICD-10-CM | POA: Diagnosis present

## 2011-12-20 DIAGNOSIS — E785 Hyperlipidemia, unspecified: Secondary | ICD-10-CM | POA: Diagnosis present

## 2011-12-20 DIAGNOSIS — R209 Unspecified disturbances of skin sensation: Secondary | ICD-10-CM | POA: Diagnosis present

## 2011-12-20 DIAGNOSIS — I480 Paroxysmal atrial fibrillation: Secondary | ICD-10-CM

## 2011-12-20 HISTORY — DX: Hyperlipidemia, unspecified: E78.5

## 2011-12-20 LAB — COMPREHENSIVE METABOLIC PANEL
Albumin: 3.5 g/dL (ref 3.5–5.2)
BUN: 17 mg/dL (ref 6–23)
Calcium: 9.4 mg/dL (ref 8.4–10.5)
GFR calc Af Amer: 50 mL/min — ABNORMAL LOW (ref >90)
Glucose, Bld: 90 mg/dL (ref 70–99)
Sodium: 138 mEq/L (ref 135–145)
Total Protein: 6.5 g/dL (ref 6.0–8.3)

## 2011-12-20 LAB — POCT PREGNANCY, URINE: Preg Test, Ur: NEGATIVE

## 2011-12-20 LAB — POCT I-STAT, CHEM 8
BUN: 19 mg/dL (ref 6–23)
Creatinine, Ser: 1.2 mg/dL — ABNORMAL HIGH (ref 0.50–1.10)
Glucose, Bld: 92 mg/dL (ref 70–99)
Hemoglobin: 13.6 g/dL (ref 12.0–15.0)
Potassium: 4.1 mEq/L (ref 3.5–5.1)
TCO2: 30 mmol/L (ref 0–100)

## 2011-12-20 LAB — CBC
MCH: 29.7 pg (ref 26.0–34.0)
MCHC: 32.6 g/dL (ref 30.0–36.0)
MCV: 91.2 fL (ref 78.0–100.0)
Platelets: 214 10*3/uL (ref 150–400)
RBC: 4.21 MIL/uL (ref 3.87–5.11)

## 2011-12-20 LAB — DIFFERENTIAL
Basophils Relative: 0 % (ref 0–1)
Eosinophils Absolute: 0 10*3/uL (ref 0.0–0.7)
Eosinophils Relative: 0 % (ref 0–5)
Lymphs Abs: 1.7 10*3/uL (ref 0.7–4.0)
Monocytes Relative: 10 % (ref 3–12)
Neutrophils Relative %: 67 % (ref 43–77)

## 2011-12-20 LAB — CK TOTAL AND CKMB (NOT AT ARMC)
Relative Index: INVALID (ref 0.0–2.5)
Total CK: 45 U/L (ref 7–177)

## 2011-12-20 LAB — TROPONIN I: Troponin I: <0.3 ng/mL (ref <0.30)

## 2011-12-20 LAB — PROTIME-INR
INR: 0.91 (ref 0.00–1.49)
Prothrombin Time: 12.5 seconds (ref 11.6–15.2)

## 2011-12-20 MED ORDER — SENNOSIDES-DOCUSATE SODIUM 8.6-50 MG PO TABS: 1.0000 | ORAL_TABLET | Freq: Every evening | ORAL | Status: DC | PRN

## 2011-12-20 MED ORDER — ASPIRIN 325 MG PO TABS: 325.0000 mg | ORAL_TABLET | Freq: Every day | ORAL | Status: DC

## 2011-12-20 MED ORDER — ASPIRIN 300 MG RE SUPP: 300.0000 mg | Freq: Every day | RECTAL | Status: DC

## 2011-12-20 MED ORDER — ENOXAPARIN SODIUM 30 MG/0.3ML ~~LOC~~ SOLN
30.0000 mg | SUBCUTANEOUS | Status: DC
  Filled 2011-12-20: qty 0.3

## 2011-12-20 MED ORDER — SODIUM CHLORIDE 0.9 % IV SOLN: INTRAVENOUS | Status: DC

## 2011-12-20 NOTE — ED Provider Notes (Signed)
History     CSN: [id number]  Arrival date & time 12/20/11  1932   First MD Initiated Contact with Patient 12/20/11 2005      Chief Complaint  Patient presents with  . Code Stroke    (Consider location/radiation/quality/duration/timing/severity/associated sxs/prior treatment) HPI Lorraine Little is a 71 y.o. female who was sitting at home today when she began to feel a tight feeling in her left shoulder at 6:40 PM. Shortly after that. She felt a "drawing" sensation in the left side of her face. She also felt that she could not talk for a period of time. She was initially worried about a heart attack, then became concerned about a stroke so came to the emergency department. Her symptoms resolved by the time she got here. She has not had problems like this previously. She feels like she was in atrial fibrillation yesterday. She has not previously had a stroke. She is not on anticoagulants. She has had no nausea or vomiting, weakness, chills, urinary problems, or difficulty walking.    Past Medical History  Diagnosis Date  . Polymyalgia rheumatica     On chronic steroids  . Hypertension   . GERD (gastroesophageal reflux disease)   . Migraine headache   . Cervical disc disease   . Osteoporosis   . Glaucoma   . Osteoarthritis   . PAF (paroxysmal atrial fibrillation)     Diagnosed 07/2011. Normal nuclear myoview 07/2011 and normal EF at that time. Most recent echo 2/20 /13 - EF 55-60% wit mild-mod MR.  Marland Kitchen Hyperlipidemia     Diagnosed 12/2011    Past Surgical History  Procedure Date  . Breast lumpectomy 1980's    right  x2  . Tonsillectomy age 37    Family History  Problem Relation Age of Onset  . Stroke Mother   . Cancer Father     History  Substance Use Topics  . Smoking status: Never Smoker   . Smokeless tobacco: Not on file  . Alcohol Use: Yes     SOCIAL    OB History    Grav Para Term Preterm Abortions TAB SAB Ect Mult Living                  Review of Systems   All other systems reviewed and are negative.    Allergies  Review of patient's allergies indicates no known allergies.  Home Medications   No current outpatient prescriptions on file.  BP 125/75  Pulse 71  Temp(Src) 98.1 F (36.7 C) (Oral)  Resp 18  Ht 5\' 3"  (1.6 m)  Wt 127 lb 13.9 oz (58 kg)  BMI 22.65 kg/m2  SpO2 97%  Physical Exam  Nursing note and vitals reviewed. Constitutional: She is oriented to person, place, and time. She appears well-developed and well-nourished.  HENT:  Head: Normocephalic and atraumatic.  Eyes: Conjunctivae and EOM are normal. Pupils are equal, round, and reactive to light.  Neck: Normal range of motion and phonation normal. Neck supple.  Cardiovascular: Normal rate, regular rhythm and intact distal pulses.   Pulmonary/Chest: Effort normal and breath sounds normal. She exhibits no tenderness.  Abdominal: Soft. She exhibits no distension. There is no tenderness. There is no guarding.  Musculoskeletal: Normal range of motion.  Neurological: She is alert and oriented to person, place, and time. She has normal strength. She exhibits normal muscle tone.  Skin: Skin is warm and dry.  Psychiatric: She has a normal mood and affect. Her behavior is normal.  Judgment and thought content normal.    ED Course  Procedures (including critical care time) The patient presented as a code stroke. She was seen by neurology and cleared. The code stroke was canceled. The neurologist will see her as a Research scientist (medical).   Date: 12/20/2011  Rate: 65  Rhythm: normal sinus rhythm  QRS Axis: normal  Intervals: normal  ST/T Wave abnormalities: normal  Conduction Disutrbances:none  Narrative Interpretation:   Old EKG Reviewed: none available   Labs Reviewed  COMPREHENSIVE METABOLIC PANEL - Abnormal; Notable for the following:    Creatinine, Ser 1.22 (*)    GFR calc non Af Amer 43 (*)    GFR calc Af Amer 50 (*)    All other components within normal limits  POCT  I-STAT, CHEM 8 - Abnormal; Notable for the following:    Creatinine, Ser 1.20 (*)    All other components within normal limits  HEMOGLOBIN A1C - Abnormal; Notable for the following:    Hemoglobin A1C 5.8 (*)    Mean Plasma Glucose 120 (*)    All other components within normal limits  CREATININE, SERUM - Abnormal; Notable for the following:    Creatinine, Ser 1.17 (*)    GFR calc non Af Amer 45 (*)    GFR calc Af Amer 53 (*)    All other components within normal limits  COMPREHENSIVE METABOLIC PANEL - Abnormal; Notable for the following:    Albumin 3.1 (*)    Alkaline Phosphatase 38 (*)    GFR calc non Af Amer 56 (*)    GFR calc Af Amer 64 (*)    All other components within normal limits  LIPID PANEL - Abnormal; Notable for the following:    Cholesterol 232 (*)    LDL Cholesterol 133 (*)    All other components within normal limits  PROTIME-INR  APTT  CBC  DIFFERENTIAL  CK TOTAL AND CKMB  TROPONIN I  GLUCOSE, CAPILLARY  POCT PREGNANCY, URINE  CBC  CBC  TSH  URINE RAPID DRUG SCREEN (HOSP PERFORMED)  GLUCOSE, POCT (MANUAL RESULT ENTRY)  GLUCOSE, POCT (MANUAL RESULT ENTRY)  GLUCOSE, POCT (MANUAL RESULT ENTRY)  GLUCOSE, POCT (MANUAL RESULT ENTRY)   Dg Chest 2 View  12/21/2011  *RADIOLOGY REPORT*  Clinical Data: Left-sided weakness; history of atrial fibrillation and hypertension.  CHEST - 2 VIEW  Comparison: Chest radiograph performed 12/20/2011  Findings: The lungs are well-aerated.  Blunting of the left costophrenic angle may reflect pleural thickening or a small left pleural effusion.  There is no evidence of focal opacification or pneumothorax.  The heart is normal in size; the mediastinal contour is within normal limits.  No acute osseous abnormalities are seen.  There is mild focal lateral vertebral body displacement at the upper lumbar spine, with associated degenerative change.  IMPRESSION:  1.  Blunting of the left costophrenic angle may reflect pleural thickening or small  left pleural effusion; lungs otherwise clear. 2.  Mild degenerative change at the upper lumbar spine.  Original Report Authenticated By: Tonia Ghent, M.D.   Ct Head Wo Contrast  12/20/2011  *RADIOLOGY REPORT*  Clinical Data: Transient left facial droop today.  Persistent left- sided numbness.  CT HEAD WITHOUT CONTRAST  Technique:  Contiguous axial images were obtained from the base of the skull through the vertex without contrast.  Comparison: Brain MR dated 09/01/2010.  Findings: Stable mildly enlarged ventricles and subarachnoid spaces.  No significant change in patchy and small focal areas of low density in  the basal ganglia and white matter bilaterally. There is some subtle low density in the central pons and left upper midbrain region, laterally.  No intracranial hemorrhage, mass lesion or CT evidence of acute infarction.  Unremarkable bones and included paranasal sinuses.  IMPRESSION:  1. Subtle low density in the central pons and left upper midbrain region, laterally, possibly representing subtle areas of ischemic change.  Otherwise, no acute abnormality. 2.  Stable atrophy and chronic small vessel ischemic changes.  These results were called by telephone on 12/20/2011  at  1947 hours to  Dr. Effie Shy, who verbally acknowledged these results.  Original Report Authenticated By: Darrol Angel, M.D.   Mr Brain Wo Contrast  12/21/2011  *RADIOLOGY REPORT*  Clinical Data:  Acute left-sided weakness and numbness left face has resolved.  High blood pressure.  History migraines.  MRI HEAD WITHOUT CONTRAST MRA HEAD WITHOUT CONTRAST  Technique: Multiplanar, multiecho pulse sequences of the brain and surrounding structures were obtained according to standard protocol without intravenous contrast.  Angiographic images of the head were obtained using MRA technique without contrast.  Comparison: 12/20/2011 head CT.  09/01/2010 brain MR.  MRI HEAD  Findings:  Acute non hemorrhagic infarct posterior right frontal lobe  (motor strip).  Remote basal ganglia and right corona radiata infarcts.  Prominent small vessel disease type changes.  No intracranial hemorrhage.  Global atrophy without hydrocephalus.  No intracranial mass lesion detected on this unenhanced exam.  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  Major intracranial vascular structures are patent.  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  Please see above.  MRA HEAD  Findings: Irregularity of the mid to distal vertical cervical segment of the internal carotid arteries bilaterally may be related to artifact limiting evaluation for detection of stenosis or fibromuscular dysplasia.  Anterior circulation without large size vessel significant stenosis or occlusion.  Decreased number of visualized right middle cerebral artery branch vessels.  Middle cerebral artery branch vessel irregularity bilaterally.  Ectatic vertebral arteries and basilar artery.  Mild irregularity of the vertebral arteries and basilar artery without high-grade stenosis.  Nonvisualization AICAs.  Tiny bulge of the distal basilar artery directed laterally. Tiny aneurysm not excluded.  This may represent result of limitation of the present exam.  Superior cerebellar artery and posterior cerebral artery mild to moderate distal branch vessel irregularity.  IMPRESSION: Intracranial atherosclerotic type changes as detailed above.  Original Report Authenticated By: Fuller Canada, M.D.   Dg Chest Portable 1 View  12/20/2011  *RADIOLOGY REPORT*  Clinical Data: 71 year old female with stroke symptoms.  Right side weakness and slurred speech.  PORTABLE CHEST - 1 VIEW  Comparison: 07/15/2011.  Findings: Portable upright AP view at 2014 hours.  No pneumothorax, pulmonary edema, pleural effusion or consolidation.  Chronically large lung volumes.  Stable cardiac size and mediastinal contours.  IMPRESSION: No acute  cardiopulmonary abnormality.  Original Report Authenticated By: Harley Hallmark, M.D.   Mr Maxine Glenn Head/brain Wo Cm  12/21/2011  *RADIOLOGY REPORT*  Clinical Data:  Acute left-sided weakness and numbness left face has resolved.  High blood pressure.  History migraines.  MRI HEAD WITHOUT CONTRAST MRA HEAD WITHOUT CONTRAST  Technique: Multiplanar, multiecho pulse sequences of the brain and surrounding structures were obtained according to standard protocol without intravenous contrast.  Angiographic images of the head were obtained using MRA technique without contrast.  Comparison: 12/20/2011 head CT.  09/01/2010 brain MR.  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  Remote basal ganglia and right corona radiata infarcts.  Prominent small vessel disease type changes.  No intracranial hemorrhage.  Global atrophy without hydrocephalus.  No intracranial mass lesion detected on this unenhanced exam.  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  Major intracranial vascular structures are patent.  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  Please see above.  MRA HEAD  Findings: Irregularity of the mid to distal vertical cervical segment of the internal carotid arteries bilaterally may be related to artifact limiting evaluation for detection of stenosis or fibromuscular dysplasia.  Anterior circulation without large size vessel significant stenosis or occlusion.  Decreased number of visualized right middle cerebral artery branch vessels.  Middle cerebral artery branch vessel irregularity bilaterally.  Ectatic vertebral arteries and basilar artery.  Mild irregularity of the vertebral arteries and basilar artery without high-grade stenosis.  Nonvisualization AICAs.  Tiny bulge of the distal basilar artery directed laterally. Tiny aneurysm not excluded.  This may represent result of limitation  of the present exam.  Superior cerebellar artery and posterior cerebral artery mild to moderate distal branch vessel irregularity.  IMPRESSION: Intracranial atherosclerotic type changes as detailed above.  Original Report Authenticated By: Fuller Canada, M.D.     1. TIA (transient ischemic attack)   2. Hypertension   3. PAF (paroxysmal atrial fibrillation)       MDM  Symptoms are consistent with TIA. No evidence for stroke. Patient currently in normal sinus rhythm. The clinical picture is not consistent with embolic stroke. No metabolic abnormality or suspected infection.  Plan: Admit for further monitoring.      Flint Melter, MD 12/21/11 1538

## 2011-12-20 NOTE — ED Notes (Signed)
Pt to ED via EMS as Code Stroke.  Last seen normal at 1830.  Pt was watching TV with sudden onset of left arm, face, tongue heaviness with slurred speech.  At arrival to ED, pt without symptoms.

## 2011-12-20 NOTE — Consult Note (Addendum)
Referring Physician:     Chief Complaint: Stroke code/left-sided paresthesias  HPI: Lorraine Little is an 71 y.o. female white presenting in the ER with left-sided paresthesias. The patient tells me that around 6:30 this evening she was sitting with her husband in the living room when she suddenly  felt the top part of her arm pulling. That " pulling" continued down her arm to her fingers. Then the patient's left face started "pulling" also. She says she can't quite describe how it felt and that it was a sort of "wrinkling" and "tingling". The patient also felt her tongue become very heavy and had difficulty speaking for this reason. The patient denies having any headache. She denies having any weakness. The patient did not have any symptoms on her right side. She's not had any visual changes or problems with swallowing. The patient did not have nausea or vomiting. She did not have any dizziness/lightheadedness/vertigo. The patient has never had this happen before. Yesterday, she had atrial fibrillation all day. She was expected to go to the ER for possible cardioversion. The patient did not have any symptoms with the episodes. The patient is on daily baby aspirin with good adherence. She's not take any NSAIDs today or yesterday or this week at all. The patient currently feels back to her normal self. Her symptoms started getting better as she was arriving in the ER.  LSN: 6:30 PM 12/20/2011  tPA Given: No: Symptoms resolved rapidly. mRankin:  Past Medical History  Diagnosis Date  . Polymyalgia rheumatica   . Hypertension   . GERD (gastroesophageal reflux disease)   . Premature atrial contraction   . Migraine headache   . Cervical disc disease   . Osteoporosis   . Glaucoma   . Osteoarthritis   . PAF (paroxysmal atrial fibrillation)     hospitalized with atrial fib with RVR in September of 2012  . Normal nuclear stress test Sept 2012  . Normal echocardiogram Sept 2012    mild MR with  normal EF    Past Surgical History  Procedure Date  . Breast lumpectomy 1980's    right  x2  . Tonsillectomy age 89    Family History  Problem Relation Age of Onset  . Stroke Mother   . Cancer Father    Social History:  reports that she has never smoked. She does not have any smokeless tobacco history on file. She reports that she drinks alcohol. She reports that she does not use illicit drugs.  Allergies: No Known Allergies  Medications: I have reviewed the patient's current medications.  ROS: The patient denies chest pain, abdominal pain, shortness of breath, diaphoresis, palpitations. She denies cough, diarrhea, pain with urination. She denies any muscle aches fever or chills.  Physical Examination: Blood pressure 139/69, pulse 68, temperature 99 F (37.2 C), temperature source Oral, resp. rate 16, SpO2 100.00%. General: The patient is alert and oriented x3. She does not appear in any distress.  Cardiovascular: Heart tones S1 and S2 are heard no S3/S4/murmurs/carotid bruits appreciated. Lungs: Clear to auscultation bilaterally in the front fields Abdomen: Soft Neurologic Examination: Naming/repetition/comprehension intact and speech is fluent. Funduscopic exam is normal bilaterally with sharp optic disc edges and venous pulsations. No visual field deficits. Pupils equal round and reactive to light. Extraocular movements intact. V1 through V3 intact to soft touch and pinprick. No facial droop. Hearing grossly intact. Pharynx arch symmetric in stand and elevation. SCM/trapezius 5/5. Tongue midline with normal movements. Motor: 5/5, no pronator  drift, normal tone Sensory: Intact to soft touch and pinprick all 4 limbs Coordination: Finger-nose-finger and heel-knee-shin within normal limits. Reflexes: Upper extremities +3 bilaterally, lower extremities +1, no Babinski's  Results for orders placed during the hospital encounter of 12/20/11 (from the past 48 hour(s))  PROTIME-INR      Status: Normal   Collection Time   12/20/11  7:32 PM      Component Value Range Comment   Prothrombin Time 12.5  11.6 - 15.2 (seconds)    INR 0.91  0.00 - 1.49    APTT     Status: Normal   Collection Time   12/20/11  7:32 PM      Component Value Range Comment   aPTT 25  24 - 37 (seconds)   CBC     Status: Normal   Collection Time   12/20/11  7:32 PM      Component Value Range Comment   WBC 7.7  4.0 - 10.5 (K/uL)    RBC 4.21  3.87 - 5.11 (MIL/uL)    Hemoglobin 12.5  12.0 - 15.0 (g/dL)    HCT 86.5  78.4 - 69.6 (%)    MCV 91.2  78.0 - 100.0 (fL)    MCH 29.7  26.0 - 34.0 (pg)    MCHC 32.6  30.0 - 36.0 (g/dL)    RDW 29.5  28.4 - 13.2 (%)    Platelets 214  150 - 400 (K/uL)   DIFFERENTIAL     Status: Normal   Collection Time   12/20/11  7:32 PM      Component Value Range Comment   Neutrophils Relative 67  43 - 77 (%)    Neutro Abs 5.1  1.7 - 7.7 (K/uL)    Lymphocytes Relative 23  12 - 46 (%)    Lymphs Abs 1.7  0.7 - 4.0 (K/uL)    Monocytes Relative 10  3 - 12 (%)    Monocytes Absolute 0.7  0.1 - 1.0 (K/uL)    Eosinophils Relative 0  0 - 5 (%)    Eosinophils Absolute 0.0  0.0 - 0.7 (K/uL)    Basophils Relative 0  0 - 1 (%)    Basophils Absolute 0.0  0.0 - 0.1 (K/uL)   CK TOTAL AND CKMB     Status: Normal   Collection Time   12/20/11  7:33 PM      Component Value Range Comment   Total CK 45  7 - 177 (U/L)    CK, MB 1.7  0.3 - 4.0 (ng/mL)    Relative Index RELATIVE INDEX IS INVALID  0.0 - 2.5    TROPONIN I     Status: Normal   Collection Time   12/20/11  7:33 PM      Component Value Range Comment   Troponin I <0.30  <0.30 (ng/mL)   POCT I-STAT, CHEM 8     Status: Abnormal   Collection Time   12/20/11  7:47 PM      Component Value Range Comment   Sodium 139  135 - 145 (mEq/L)    Potassium 4.1  3.5 - 5.1 (mEq/L)    Chloride 103  96 - 112 (mEq/L)    BUN 19  6 - 23 (mg/dL)    Creatinine, Ser 4.40 (*) 0.50 - 1.10 (mg/dL)    Glucose, Bld 92  70 - 99 (mg/dL)    Calcium, Ion 1.02   1.12 - 1.32 (mmol/L)    TCO2 30  0 -  100 (mmol/L)    Hemoglobin 13.6  12.0 - 15.0 (g/dL)    HCT 81.1  91.4 - 78.2 (%)   GLUCOSE, CAPILLARY     Status: Normal   Collection Time   12/20/11  8:20 PM      Component Value Range Comment   Glucose-Capillary 81  70 - 99 (mg/dL)    Ct Head Wo Contrast  12/20/2011  *RADIOLOGY REPORT*  Clinical Data: Transient left facial droop today.  Persistent left- sided numbness.  CT HEAD WITHOUT CONTRAST  Technique:  Contiguous axial images were obtained from the base of the skull through the vertex without contrast.  Comparison: Brain MR dated 09/01/2010.  Findings: Stable mildly enlarged ventricles and subarachnoid spaces.  No significant change in patchy and small focal areas of low density in the basal ganglia and white matter bilaterally. There is some subtle low density in the central pons and left upper midbrain region, laterally.  No intracranial hemorrhage, mass lesion or CT evidence of acute infarction.  Unremarkable bones and included paranasal sinuses.  IMPRESSION:  1. Subtle low density in the central pons and left upper midbrain region, laterally, possibly representing subtle areas of ischemic change.  Otherwise, no acute abnormality. 2.  Stable atrophy and chronic small vessel ischemic changes.  These results were called by telephone on 12/20/2011  at  1947 hours to  Dr. Effie Shy, who verbally acknowledged these results.  Original Report Authenticated By: Darrol Angel, M.D.    Assessment: 71 y.o. female Y. with a past medical history of atrial fibrillation, hypertension, migraines and cervical disc disease presenting in the ER with transient left face and arm paresthesias after a day of nonsymptomatic atrial fibrillation. She is taking a baby aspirin daily with good adherence. Physical exam is nonlocalizing. Labs including cardiac enzymes are normal except for slightly elevated creatinine.  EKG shows a sinus rhythm with normal rate. 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   Stroke Risk Factors - atrial fibrillation and hypertension, FMH  Plan: 1. HgbA1c, fasting lipid panel 2. MRI, MRA  of the brain without contrast 3. PT consult, OT consult, Speech consult 4. Echocardiogram 5. Carotid dopplers 6. Prophylactic therapy-Antiplatelet med: Aspirin - dose 325mg  7. Risk factor modification 8. Telemetry monitoring 9. Cardiology consult  Arita Miss, MD [facility]  12/20/2011, 8:27 PM

## 2011-12-20 NOTE — ED Notes (Signed)
Called [facility] to cancel Code Stroke at 1953 per RN, Zina.

## 2011-12-20 NOTE — ED Notes (Signed)
Visitors in [employer]

## 2011-12-21 ENCOUNTER — Encounter (HOSPITAL_COMMUNITY): Payer: Self-pay | Admitting: Internal Medicine

## 2011-12-21 ENCOUNTER — Inpatient Hospital Stay (HOSPITAL_COMMUNITY): Payer: Medicare Other

## 2011-12-21 ENCOUNTER — Observation Stay (HOSPITAL_COMMUNITY): Payer: Medicare Other

## 2011-12-21 DIAGNOSIS — I059 Rheumatic mitral valve disease, unspecified: Secondary | ICD-10-CM

## 2011-12-21 DIAGNOSIS — I4891 Unspecified atrial fibrillation: Secondary | ICD-10-CM

## 2011-12-21 DIAGNOSIS — I639 Cerebral infarction, unspecified: Secondary | ICD-10-CM | POA: Diagnosis present

## 2011-12-21 DIAGNOSIS — I10 Essential (primary) hypertension: Secondary | ICD-10-CM

## 2011-12-21 DIAGNOSIS — G459 Transient cerebral ischemic attack, unspecified: Secondary | ICD-10-CM

## 2011-12-21 LAB — COMPREHENSIVE METABOLIC PANEL
Alkaline Phosphatase: 38 U/L — ABNORMAL LOW (ref 39–117)
BUN: 15 mg/dL (ref 6–23)
Calcium: 9.1 mg/dL (ref 8.4–10.5)
GFR calc Af Amer: 64 mL/min — ABNORMAL LOW (ref >90)
Glucose, Bld: 87 mg/dL (ref 70–99)
Total Protein: 6 g/dL (ref 6.0–8.3)

## 2011-12-21 LAB — RAPID URINE DRUG SCREEN, HOSP PERFORMED
Cocaine: NOT DETECTED
Opiates: NOT DETECTED
Tetrahydrocannabinol: NOT DETECTED

## 2011-12-21 LAB — HEMOGLOBIN A1C
Hgb A1c MFr Bld: 5.8 % — ABNORMAL HIGH (ref <5.7)
Mean Plasma Glucose: 120 mg/dL — ABNORMAL HIGH (ref <117)

## 2011-12-21 LAB — CBC
HCT: 38.5 % (ref 36.0–46.0)
Hemoglobin: 12.9 g/dL (ref 12.0–15.0)
Platelets: 228 10*3/uL (ref 150–400)
RDW: 13.5 % (ref 11.5–15.5)
RDW: 13.7 % (ref 11.5–15.5)
WBC: 7.2 10*3/uL (ref 4.0–10.5)
WBC: 7.5 10*3/uL (ref 4.0–10.5)

## 2011-12-21 LAB — GLUCOSE, CAPILLARY: Glucose-Capillary: 84 mg/dL (ref 70–99)

## 2011-12-21 LAB — CREATININE, SERUM
Creatinine, Ser: 1.17 mg/dL — ABNORMAL HIGH (ref 0.50–1.10)
GFR calc Af Amer: 53 mL/min — ABNORMAL LOW (ref >90)
GFR calc non Af Amer: 45 mL/min — ABNORMAL LOW (ref >90)

## 2011-12-21 LAB — TSH: TSH: 1.563 u[IU]/mL (ref 0.350–4.500)

## 2011-12-21 LAB — LIPID PANEL
HDL: 87 mg/dL (ref >39)
LDL Cholesterol: 133 mg/dL — ABNORMAL HIGH (ref 0–99)
VLDL: 12 mg/dL (ref 0–40)

## 2011-12-21 MED ORDER — POTASSIUM CHLORIDE CRYS ER 10 MEQ PO TBCR
10.0000 meq | EXTENDED_RELEASE_TABLET | Freq: Every day | ORAL | Status: DC
  Administered 2011-12-21 – 2011-12-22 (×2): 10 meq via ORAL
  Filled 2011-12-21 (×2): qty 1

## 2011-12-21 MED ORDER — SIMVASTATIN 20 MG PO TABS
20.0000 mg | ORAL_TABLET | Freq: Every day | ORAL | Status: DC
  Filled 2011-12-21: qty 1

## 2011-12-21 MED ORDER — DILTIAZEM HCL ER COATED BEADS 120 MG PO CP24
120.0000 mg | ORAL_CAPSULE | Freq: Every day | ORAL | Status: DC
  Administered 2011-12-21 – 2011-12-22 (×2): 120 mg via ORAL
  Filled 2011-12-21 (×2): qty 1

## 2011-12-21 MED ORDER — GABAPENTIN 100 MG PO CAPS: 100.0000 mg | ORAL_CAPSULE | Freq: Two times a day (BID) | ORAL | Status: DC

## 2011-12-21 MED ORDER — OMEGA-3-ACID ETHYL ESTERS 1 G PO CAPS
1.0000 g | ORAL_CAPSULE | Freq: Two times a day (BID) | ORAL | Status: DC
  Administered 2011-12-21 – 2011-12-22 (×3): 1 g via ORAL
  Filled 2011-12-21 (×5): qty 1

## 2011-12-21 MED ORDER — GABAPENTIN 100 MG PO CAPS
200.0000 mg | ORAL_CAPSULE | Freq: Every day | ORAL | Status: DC
  Administered 2011-12-21 (×2): 200 mg via ORAL
  Filled 2011-12-21 (×3): qty 2

## 2011-12-21 MED ORDER — PREDNISONE 5 MG PO TABS
5.0000 mg | ORAL_TABLET | Freq: Every day | ORAL | Status: DC
  Administered 2011-12-21 – 2011-12-22 (×2): 5 mg via ORAL
  Filled 2011-12-21 (×3): qty 1

## 2011-12-21 MED ORDER — GABAPENTIN 100 MG PO CAPS
100.0000 mg | ORAL_CAPSULE | Freq: Every day | ORAL | Status: DC
  Administered 2011-12-21 – 2011-12-22 (×2): 100 mg via ORAL
  Filled 2011-12-21 (×2): qty 1

## 2011-12-21 MED ORDER — DABIGATRAN ETEXILATE MESYLATE 150 MG PO CAPS
150.0000 mg | ORAL_CAPSULE | Freq: Two times a day (BID) | ORAL | Status: DC
  Administered 2011-12-21 – 2011-12-22 (×3): 150 mg via ORAL
  Filled 2011-12-21 (×4): qty 1

## 2011-12-21 MED ORDER — SIMVASTATIN 10 MG PO TABS
10.0000 mg | ORAL_TABLET | Freq: Every day | ORAL | Status: DC
  Administered 2011-12-21: 10 mg via ORAL
  Filled 2011-12-21 (×2): qty 1

## 2011-12-21 MED ORDER — ZOLPIDEM TARTRATE 5 MG PO TABS
5.0000 mg | ORAL_TABLET | Freq: Every day | ORAL | Status: DC
  Administered 2011-12-21: 5 mg via ORAL
  Filled 2011-12-21: qty 1

## 2011-12-21 MED ORDER — PANTOPRAZOLE SODIUM 40 MG PO TBEC
40.0000 mg | DELAYED_RELEASE_TABLET | Freq: Every day | ORAL | Status: DC
  Administered 2011-12-21 – 2011-12-22 (×2): 40 mg via ORAL
  Filled 2011-12-21 (×2): qty 1

## 2011-12-21 MED ORDER — ATENOLOL 50 MG PO TABS
50.0000 mg | ORAL_TABLET | Freq: Every day | ORAL | Status: DC
  Administered 2011-12-21 – 2011-12-22 (×2): 50 mg via ORAL
  Filled 2011-12-21 (×2): qty 1

## 2011-12-21 MED ORDER — ASPIRIN 325 MG PO TABS
325.0000 mg | ORAL_TABLET | Freq: Every day | ORAL | Status: AC
  Administered 2011-12-21: 325 mg via ORAL
  Filled 2011-12-21: qty 1

## 2011-12-21 MED ORDER — TEMAZEPAM 15 MG PO CAPS: 15.0000 mg | ORAL_CAPSULE | Freq: Every day | ORAL | Status: DC

## 2011-12-21 MED ORDER — ENOXAPARIN SODIUM 40 MG/0.4ML ~~LOC~~ SOLN
40.0000 mg | SUBCUTANEOUS | Status: DC
  Filled 2011-12-21: qty 0.4

## 2011-12-21 MED ORDER — WHITE PETROLATUM GEL
Status: AC
  Administered 2011-12-21: 1
  Filled 2011-12-21: qty 5

## 2011-12-21 MED ORDER — SODIUM CHLORIDE 0.9 % IV SOLN
INTRAVENOUS | Status: DC
  Administered 2011-12-21: 75 mL/h via INTRAVENOUS

## 2011-12-21 NOTE — Discharge Instructions (Signed)
STROKE/TIA DISCHARGE INSTRUCTIONS SMOKING Cigarette smoking nearly doubles your risk of having a stroke & is the single most alterable risk factor  If you smoke or have smoked in the last 12 months, you are advised to quit smoking for your health.  Most of the excess cardiovascular risk related to smoking disappears within a year of stopping.  Ask you doctor about anti-smoking medications  [facility] Quit Line: 1-800-QUIT NOW  Free Smoking Cessation Classes ([phone]  CHOLESTEROL Know your levels; limit fat & cholesterol in your diet  Lipid Panel  No results found for this basename: chol, trig, hdl, cholhdl, vldl, ldlcalc      Many patients benefit from treatment even if their cholesterol is at goal.  Goal: Total Cholesterol (CHOL) less than 160  Goal:  Triglycerides (TRIG) less than 150  Goal:  HDL greater than 40  Goal:  LDL (LDLCALC) less than 100   BLOOD PRESSURE American Stroke Association blood pressure target is less that 120/80 mm/Hg  Your discharge blood pressure is:  BP: 111/71 mmHg  Monitor your blood pressure  Limit your salt and alcohol intake  Many individuals will require more than one medication for high blood pressure  DIABETES (A1c is a blood sugar average for last 3 months) Goal HGBA1c is under 7% (HBGA1c is blood sugar average for last 3 months)  Diabetes: {STROKE DC DIABETES:22357}    No results found for this basename: HGBA1C     Your HGBA1c can be lowered with medications, healthy diet, and exercise.  Check your blood sugar as directed by your physician  Call your physician if you experience unexplained or low blood sugars.  PHYSICAL ACTIVITY/REHABILITATION Goal is 30 minutes at least 4 days per week    {STROKE DC ACTIVITY/REHAB:22359}  Activity decreases your risk of heart attack and stroke and makes your heart stronger.  It helps control your weight and blood pressure; helps you relax and can improve your mood.  Participate in a regular exercise  program.  Talk with your doctor about the best form of exercise for you (dancing, walking, swimming, cycling).  DIET/WEIGHT Goal is to maintain a healthy weight  Your discharge diet is: Cardiac *** liquids Your height is:  Height: 5\' 3"  (160 cm) Your current weight is: Weight: 58 kg (127 lb 13.9 oz) Your Body Mass Index (BMI) is:  BMI (Calculated): 22.7   Following the type of diet specifically designed for you will help prevent another stroke.  Your goal weight range is:  ***  Your goal Body Mass Index (BMI) is 19-24.  Healthy food habits can help reduce 3 risk factors for stroke:  High cholesterol, hypertension, and excess weight.  RESOURCES Stroke/Support Group:  Call [phone]  they meet the 3rd Sunday of the month on the Rehab Unit at [facility], [place] ( no meetings June, July & Aug).  STROKE EDUCATION PROVIDED/REVIEWED AND GIVEN TO PATIENT Stroke warning signs and symptoms How to activate emergency medical system (call 911). Medications prescribed at discharge. Need for follow-up after discharge. Personal risk factors for stroke. Pneumonia vaccine given:   {STROKE DC YES/NO/DATE:22363} Flu vaccine given:   {STROKE DC YES/NO/DATE:22363} My questions have been answered, the writing is legible, and I understand these instructions.  I will adhere to these goals & educational materials that have been provided to me after my discharge from the hospital.

## 2011-12-21 NOTE — Consult Note (Signed)
CARDIOLOGY CONSULT NOTE  Patient ID: Lorraine Little, MRN: [id number], DOB/AGE: 73-Mar-1942 73 y.o. Admit date: 12/20/2011 Date of Consult: 12/21/2011  Primary Physician: Ezequiel Kayser, MD, MD Primary Cardiologist: Dr. [place]  Chief Complaint: left arm sensation, weakness  HPI: Lorraine Little is a 71 y/o F with hx of PAF, negative OP myoview, normal EF who presented to [facility] with complaints of L sided tingling. She was sitting in her living room when she felt the top part of her arm "pulling." Her tongue became heavy and she had difficulty speaking. She denied any visual changes, nausea or vomiting. This had never happened before. She called into the office 12/19/11 complaining of atrial fibrillation all day with no relief after taking flecainide at home. It was recommended that she proceed to the ER for assessment but she ended up converting to NSR on her own so she decided not to go. She presented the following day with these parasthesias/weakness lasting 1/2 hour, ultimately felt by neurology team initially to represent a right brain TIA likely cardioembolic from atrial fibrillation. MRI of the brain has in fact revealed an acute non hemorrhagic infarct posterior right frontal lobe (motor strip). Her symptoms have almost fully resolved. She denies any CP, SOB. The neurology team wishes to start her back on her Pradaxa. She was previously on Pradaxa at discharge 07/2011, but this was changed to baby aspirin in follow-up October 2012 after her CHADS2 was assessed at 1 with stable rhythm. Her other vascular risk factors include hyperlipidemia. We have been asked to see her to weigh in regarding anticoagulation. She denies any history of melena, BRBPR, or ulcers. She does endorse a hx of intermittent hematuria that she states has been fully evaluated as benign. She had no problems with frank hematuria or any other problems previously while on Pradaxa or ASA.  Past Medical History  Diagnosis Date    . Polymyalgia rheumatica   . Hypertension   . GERD (gastroesophageal reflux disease)   . Migraine headache   . Cervical disc disease   . Osteoporosis   . Glaucoma   . Osteoarthritis   . PAF (paroxysmal atrial fibrillation)     Diagnosed 07/2011. Normal nuclear myoview 07/2011 and normal EF at that time. Most recent echo 2/20 /13 - EF 55-60% wit mild-mod MR.  Marland Kitchen Hyperlipidemia      Nuclear Stress Test 9/12 - Overall Impression: Low risk study with normal EF and normal nuclear images. Short burst of SVT during exercise and positive ECG response to exercise  2D Echo 12/21/11 Study Conclusions - Left ventricle: Systolic function was normal. The estimated ejection fraction was in the range of 55% to 60%. - Mitral valve: Mild to moderate regurgitation. - Atrial septum: No defect or patent foramen ovale was identified.  Surgical History:  Past Surgical History  Procedure Date  . Breast lumpectomy 1980's    right  x2  . Tonsillectomy age 64     Home Meds: Medication Sig  alendronate (FOSAMAX) 70 MG tablet Take 70 mg by mouth every 7 (seven) days. Take with a full glass of water on an empty stomach. Take on Wednesday  aspirin EC 81 MG tablet Take 81 mg by mouth daily.  atenolol (TENORMIN) 50 MG tablet Take 50 mg by mouth daily.   Calcium Carbonate-Vitamin D (CALCIUM + D) 600-200 MG-UNIT TABS Take 1 tablet by mouth 2 (two) times daily.   diltiazem (CARDIZEM CD) 120 MG 24 hr capsule Take 120 mg by mouth  daily.   fish oil-omega-3 fatty acids 1000 MG capsule Take 1 g by mouth 2 (two) times daily.   flecainide (TAMBOCOR) 100 MG tablet Take 100mg  1 tablet prn onset of atrial fib.  gabapentin (NEURONTIN) 100 MG capsule Take 100-200 mg by mouth 2 (two) times daily. Take 100mg  at noon daily and take 200mg  every night at bedtime  pantoprazole (PROTONIX) 40 MG tablet Take 40 mg by mouth daily.   potassium chloride (K-DUR,KLOR-CON) 10 MEQ tablet Take 10 mEq by mouth daily.  predniSONE (DELTASONE) 5 MG  tablet Take 5 mg by mouth daily.   Sennosides (SENNA LAX PO) Take 1 tablet by mouth at bedtime.   temazepam (RESTORIL) 15 MG capsule Take 15 mg by mouth at bedtime.   triamterene-hydrochlorothiazide (DYAZIDE) 37.5-25 MG per capsule 0.5 capsules every morning. Takes 1/2 tablet daily  Vitamin D, Ergocalciferol, (DRISDOL) 50000 UNITS CAPS Take 50,000 Units by mouth every 14 (fourteen) days.    Inpatient Medications:     . aspirin  325 mg Oral Daily  . atenolol  50 mg Oral Daily  . dabigatran  150 mg Oral Q12H  . diltiazem  120 mg Oral Daily  . gabapentin  100 mg Oral Q1200  . gabapentin  200 mg Oral QHS  . omega-3 acid ethyl esters  1 g Oral BID WC  . pantoprazole  40 mg Oral Q1200  . potassium chloride  10 mEq Oral Daily  . predniSONE  5 mg Oral Q breakfast  . simvastatin  20 mg Oral q1800  . white petrolatum      . zolpidem  5 mg Oral QHS  . DISCONTD: aspirin  300 mg Rectal Daily  . DISCONTD: aspirin  325 mg Oral Daily  . DISCONTD: enoxaparin  30 mg Subcutaneous Q24H  . DISCONTD: enoxaparin  40 mg Subcutaneous Q24H  . DISCONTD: gabapentin  100-200 mg Oral BID  . DISCONTD: temazepam  15 mg Oral QHS    Allergies: No Known Allergies  History   Social History  . Marital Status: Single    Spouse Name: N/A    Number of Children: 3  . Years of Education: N/A   Occupational History  . homemaker    Social History Main Topics  . Smoking status: Never Smoker   . Smokeless tobacco: Not on file  . Alcohol Use: Yes     SOCIAL  . Drug Use: No  . Sexually Active: Not on file   Other Topics Concern  . Not on file   Social History Narrative  . No narrative on file     Family History  Problem Relation Age of Onset  . Stroke Mother   . Cancer Father      Review of Systems: General: negative for chills, fever, night sweats or weight changes.  Cardiovascular: negative for chest pain, edema, orthopnea, palpitations, paroxysmal nocturnal dyspnea, shortness of breath or  dyspnea on exertion Dermatological: negative for rash Respiratory: negative for cough or wheezing Urologic: positive hx of hematuria Abdominal: negative for nausea, vomiting, diarrhea, bright red blood per rectum, melena, or hematemesis Neurologic: see above All other systems reviewed and are otherwise negative except as noted above.  Labs:  [facility] 12/20/11 1933  CKTOTAL 45  CKMB 1.7  TROPONINI <0.30   Lab Results  Component Value Date   WBC 7.2 12/21/2011   HGB 12.9 12/21/2011   HCT 38.5 12/21/2011   MCV 90.2 12/21/2011   PLT 226 12/21/2011     Lab 12/21/11 0550  NA 143  K 3.5  CL 107  CO2 27  BUN 15  CREATININE 0.99  CALCIUM 9.1  PROT 6.0  BILITOT 0.4  ALKPHOS 38*  ALT 10  AST 17  GLUCOSE 87   Lab Results  Component Value Date   CHOL 232* 12/21/2011   HDL 87 12/21/2011   LDLCALC 133* 12/21/2011   TRIG 60 12/21/2011   Radiology/Studies:  1. Chest 2 View 12/21/2011  *RADIOLOGY REPORT*   IMPRESSION:  1.  Blunting of the left costophrenic angle may reflect pleural thickening or small left pleural effusion; lungs otherwise clear. 2.  Mild degenerative change at the upper lumbar spine.  Original Report Authenticated By: Tonia Ghent, M.D.   2. Ct Head Wo Contrast 12/20/2011  *RADIOLOGY REPORT*  .  IMPRESSION:  1. Subtle low density in the central pons and left upper midbrain region, laterally, possibly representing subtle areas of ischemic change.  Otherwise, no acute abnormality. 2.  Stable atrophy and chronic small vessel ischemic changes.  These results were called by telephone on 12/20/2011  at  1947 hours to  Dr. Effie Shy, who verbally acknowledged these results.  Original Report Authenticated By: Darrol Angel, M.D.   3. Mr Brain Wo Contrast 12/21/2011  *RADIOLOGY REPORT*  Clinical Data:  Acute left-sided weakness and numbness left face has resolved.  High blood pressure.  History migraines.  MRI HEAD WITHOUT CONTRAST MRA HEAD WITHOUT CONTRAST  Technique: Multiplanar,  multiecho pulse sequences of the brain and surrounding structures were obtained according to standard protocol without intravenous contrast.  Angiographic images of the head were obtained using MRA technique without contrast.  Comparison: 12/20/2011 head CT.  09/01/2010 brain MR.  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  Remote basal ganglia and right corona radiata infarcts.  Prominent small vessel disease type changes.  No intracranial hemorrhage.  Global atrophy without hydrocephalus.  No intracranial mass lesion detected on this unenhanced exam.  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  Major intracranial vascular structures are patent.  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  Please see above.  MRA HEAD  Findings: Irregularity of the mid to distal vertical cervical segment of the internal carotid arteries bilaterally may be related to artifact limiting evaluation for detection of stenosis or fibromuscular dysplasia.  Anterior circulation without large size vessel significant stenosis or occlusion.  Decreased number of visualized right middle cerebral artery branch vessels.  Middle cerebral artery branch vessel irregularity bilaterally.  Ectatic vertebral arteries and basilar artery.  Mild irregularity of the vertebral arteries and basilar artery without high-grade stenosis.  Nonvisualization AICAs.  Tiny bulge of the distal basilar artery directed laterally. Tiny aneurysm not excluded.  This may represent result of limitation of the present exam.  Superior cerebellar artery and posterior cerebral artery mild to moderate distal branch vessel irregularity.  IMPRESSION: Intracranial atherosclerotic type changes as detailed above.  Original Report Authenticated By: Fuller Canada  EKG: NSR rate 65bpm first degree AV block no acute changes  Physical Exam: Blood  pressure 125/75, pulse 71, temperature 98.1 F (36.7 C), temperature source Oral, resp. rate 18, height 5\' 3"  (1.6 m), weight 127 lb 13.9 oz (58 kg), SpO2 97.00%. General: Well developed, well nourished, in no acute distress. Head: Normocephalic, atraumatic, sclera non-icteric, no xanthomas, nares are without discharge.  Neck: Negative for carotid bruits. JVD not elevated. Lungs: Clear bilaterally to auscultation without  wheezes, rales, or rhonchi. Breathing is unlabored. Heart: RRR with S1 S2. No murmurs, rubs, or gallops appreciated. Abdomen: Soft, non-tender, non-distended with normoactive bowel sounds. No hepatomegaly. No rebound/guarding. No obvious abdominal masses. Msk:  Strength and tone appear normal for age. Extremities: No clubbing or cyanosis. No edema.  Distal pedal pulses are 2+ and equal bilaterally. Neuro: Alert and oriented X 3. Moves all extremities spontaneously. Psych:  Responds to questions appropriately with a normal affect.   Assessment and Plan:   1. 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 Paroxysmal atrial fibrillation 3. Controlled hypertension  Agree with need for anticoagulation now given increased CHADS2 score of 2. CrCl is 54 so agree with dose of Pradaxa 150mg  bid. Is currently maintaining NSR on present meds so would continue for now. Normal LV function by echo. Please note max dose of Zocor while on concurrent Diltiazem is 10mg  daily so will decrease Zocor. Consider change to Lipitor or Crestor at discharge. I am not sure she needs concurrent aspirin as well, so will discuss with MD.  Signed, Ronie Spies PA-C 12/21/2011, 2:15 PM  Patient seen and examined and history reviewed. Agree with above findings and plan. Patient had 8 hours of symptomatic afib on Monday. Admitted yesterday with a new CVA. Symptoms have largely resolved. Echo is normal. Clearly now she needs long term anticoagulation. She has tolerated Pradaxa well in the past.  Estimated GFR is 54 cc/hr so no dose adjustment needed. Start 150 mg bid. Would DC ASA. Would check renal function every 3 months for 1 year then biannually if stable.   Theron Arista [facility] 12/21/2011 3:05 PM

## 2011-12-21 NOTE — Progress Notes (Signed)
DAILY PROGRESS NOTE                              [facility]  SUBJECTIVE: Feels she is back to baseline.  OBJECTIVE: BP 125/75  Pulse 71  Temp(Src) 98.1 F (36.7 C) (Oral)  Resp 18  Ht 5\' 3"  (1.6 m)  Wt 58 kg (127 lb 13.9 oz)  BMI 22.65 kg/m2  SpO2 97%  Intake/Output Summary (Last 24 hours) at 12/21/11 1541 Last data filed at 12/21/11 0700  Gross per 24 hour  Intake    120 ml  Output      0 ml  Net    120 ml                      Weight change:  Physical Exam: General: Alert and awake oriented x3 not in any acute distress. HEENT: anicteric sclera, pupils equal reactive to light and accommodation CVS: S1-S2 heard, no murmur rubs or gallops Chest: clear to auscultation bilaterally, no wheezing rales or rhonchi Abdomen:  normal bowel sounds, soft, nontender, nondistended, no organomegaly Neuro: Cranial nerves II-XII intact, no focal neurological deficits Extremities: no cyanosis, no clubbing or edema noted bilaterally   Lab Results:  Basename 12/21/11 0550 12/20/11 2346 12/20/11 1947 12/20/11 1932  NA 143 -- 139 --  K 3.5 -- 4.1 --  CL 107 -- 103 --  CO2 27 -- -- 28  GLUCOSE 87 -- 92 --  BUN 15 -- 19 --  CREATININE 0.99 1.17* -- --  CALCIUM 9.1 -- -- 9.4  MG -- -- -- --  PHOS -- -- -- --    Basename 12/21/11 0550 12/20/11 1932  AST 17 19  ALT 10 12  ALKPHOS 38* 54  BILITOT 0.4 0.3  PROT 6.0 6.5  ALBUMIN 3.1* 3.5   No results found for this basename: LIPASE:2,AMYLASE:2 in the last 72 hours  Basename 12/21/11 0550 12/20/11 2346 12/20/11 1932  WBC 7.2 7.5 --  NEUTROABS -- -- 5.1  HGB 12.9 12.7 --  HCT 38.5 37.7 --  MCV 90.2 90.0 --  PLT 226 228 --    Basename 12/20/11 1933  CKTOTAL 45  CKMB 1.7  CKMBINDEX --  TROPONINI <0.30   No components found with this basename: POCBNP:3 No results found for this basename: DDIMER:2 in the last 72 hours  Basename 12/21/11 0550  HGBA1C 5.8*    Basename 12/21/11 0550  CHOL  232*  HDL 87  LDLCALC 133*  TRIG 60  CHOLHDL 2.7  LDLDIRECT --    Basename 12/21/11 0550  TSH 1.563  T4TOTAL --  T3FREE --  THYROIDAB --   No results found for this basename: VITAMINB12:2,FOLATE:2,FERRITIN:2,TIBC:2,IRON:2,RETICCTPCT:2 in the last 72 hours  Micro Results: No results found for this or any previous visit (from the past 240 hour(s)).  Studies/Results: Dg Chest 2 View  12/21/2011  *RADIOLOGY REPORT*  Clinical Data: Left-sided weakness; history of atrial fibrillation and hypertension.  CHEST - 2 VIEW  Comparison: Chest radiograph performed 12/20/2011  Findings: The lungs are well-aerated.  Blunting of the left costophrenic angle may reflect pleural thickening or a small left pleural effusion.  There is no evidence of focal opacification or pneumothorax.  The heart is normal in size; the mediastinal contour is within normal limits.  No acute osseous abnormalities are seen.  There is mild focal lateral vertebral body displacement at the upper lumbar spine, with associated  degenerative change.  IMPRESSION:  1.  Blunting of the left costophrenic angle may reflect pleural thickening or small left pleural effusion; lungs otherwise clear. 2.  Mild degenerative change at the upper lumbar spine.  Original Report Authenticated By: Tonia Ghent, M.D.   Ct Head Wo Contrast  12/20/2011  *RADIOLOGY REPORT*  Clinical Data: Transient left facial droop today.  Persistent left- sided numbness.  CT HEAD WITHOUT CONTRAST  Technique:  Contiguous axial images were obtained from the base of the skull through the vertex without contrast.  Comparison: Brain MR dated 09/01/2010.  Findings: Stable mildly enlarged ventricles and subarachnoid spaces.  No significant change in patchy and small focal areas of low density in the basal ganglia and white matter bilaterally. There is some subtle low density in the central pons and left upper midbrain region, laterally.  No intracranial hemorrhage, mass lesion or CT  evidence of acute infarction.  Unremarkable bones and included paranasal sinuses.  IMPRESSION:  1. Subtle low density in the central pons and left upper midbrain region, laterally, possibly representing subtle areas of ischemic change.  Otherwise, no acute abnormality. 2.  Stable atrophy and chronic small vessel ischemic changes.  These results were called by telephone on 12/20/2011  at  1947 hours to  Dr. Effie Shy, who verbally acknowledged these results.  Original Report Authenticated By: Darrol Angel, M.D.   Mr Brain Wo Contrast  12/21/2011  *RADIOLOGY REPORT*  Clinical Data:  Acute left-sided weakness and numbness left face has resolved.  High blood pressure.  History migraines.  MRI HEAD WITHOUT CONTRAST MRA HEAD WITHOUT CONTRAST  Technique: Multiplanar, multiecho pulse sequences of the brain and surrounding structures were obtained according to standard protocol without intravenous contrast.  Angiographic images of the head were obtained using MRA technique without contrast.  Comparison: 12/20/2011 head CT.  09/01/2010 brain MR.  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  Remote basal ganglia and right corona radiata infarcts.  Prominent small vessel disease type changes.  No intracranial hemorrhage.  Global atrophy without hydrocephalus.  No intracranial mass lesion detected on this unenhanced exam.  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  Major intracranial vascular structures are patent.  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  Please see above.  MRA HEAD  Findings: Irregularity of the mid to distal vertical cervical segment of the internal carotid arteries bilaterally may be related to artifact limiting evaluation for detection of stenosis or fibromuscular dysplasia.  Anterior circulation without large size vessel significant stenosis or occlusion.  Decreased  number of visualized right middle cerebral artery branch vessels.  Middle cerebral artery branch vessel irregularity bilaterally.  Ectatic vertebral arteries and basilar artery.  Mild irregularity of the vertebral arteries and basilar artery without high-grade stenosis.  Nonvisualization AICAs.  Tiny bulge of the distal basilar artery directed laterally. Tiny aneurysm not excluded.  This may represent result of limitation of the present exam.  Superior cerebellar artery and posterior cerebral artery mild to moderate distal branch vessel irregularity.  IMPRESSION: Intracranial atherosclerotic type changes as detailed above.  Original Report Authenticated By: Fuller Canada, M.D.   Dg Chest Portable 1 View  12/20/2011  *RADIOLOGY REPORT*  Clinical Data: 71 year old female with stroke symptoms.  Right side weakness and slurred speech.  PORTABLE CHEST - 1 VIEW  Comparison: 07/15/2011.  Findings: Portable upright AP view at 2014 hours.  No pneumothorax, pulmonary edema,  pleural effusion or consolidation.  Chronically large lung volumes.  Stable cardiac size and mediastinal contours.  IMPRESSION: No acute cardiopulmonary abnormality.  Original Report Authenticated By: Harley Hallmark, M.D.   Mr Maxine Glenn Head/brain Wo Cm  12/21/2011  *RADIOLOGY REPORT*  Clinical Data:  Acute left-sided weakness and numbness left face has resolved.  High blood pressure.  History migraines.  MRI HEAD WITHOUT CONTRAST MRA HEAD WITHOUT CONTRAST  Technique: Multiplanar, multiecho pulse sequences of the brain and surrounding structures were obtained according to standard protocol without intravenous contrast.  Angiographic images of the head were obtained using MRA technique without contrast.  Comparison: 12/20/2011 head CT.  09/01/2010 brain MR.  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  Remote basal ganglia and right corona radiata infarcts.  Prominent small vessel disease type changes.   No intracranial hemorrhage.  Global atrophy without hydrocephalus.  No intracranial mass lesion detected on this unenhanced exam.  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  Major intracranial vascular structures are patent.  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  Please see above.  MRA HEAD  Findings: Irregularity of the mid to distal vertical cervical segment of the internal carotid arteries bilaterally may be related to artifact limiting evaluation for detection of stenosis or fibromuscular dysplasia.  Anterior circulation without large size vessel significant stenosis or occlusion.  Decreased number of visualized right middle cerebral artery branch vessels.  Middle cerebral artery branch vessel irregularity bilaterally.  Ectatic vertebral arteries and basilar artery.  Mild irregularity of the vertebral arteries and basilar artery without high-grade stenosis.  Nonvisualization AICAs.  Tiny bulge of the distal basilar artery directed laterally. Tiny aneurysm not excluded.  This may represent result of limitation of the present exam.  Superior cerebellar artery and posterior cerebral artery mild to moderate distal branch vessel irregularity.  IMPRESSION: Intracranial atherosclerotic type changes as detailed above.  Original Report Authenticated By: Fuller Canada, M.D.   Medications: Scheduled Meds:   . aspirin  325 mg Oral Daily  . atenolol  50 mg Oral Daily  . dabigatran  150 mg Oral Q12H  . diltiazem  120 mg Oral Daily  . gabapentin  100 mg Oral Q1200  . gabapentin  200 mg Oral QHS  . omega-3 acid ethyl esters  1 g Oral BID WC  . pantoprazole  40 mg Oral Q1200  . potassium chloride  10 mEq Oral Daily  . predniSONE  5 mg Oral Q breakfast  . simvastatin  10 mg Oral q1800  . white petrolatum      . zolpidem  5 mg Oral QHS  . DISCONTD: aspirin  300 mg Rectal Daily  . DISCONTD: aspirin  325 mg Oral Daily    . DISCONTD: enoxaparin  30 mg Subcutaneous Q24H  . DISCONTD: enoxaparin  40 mg Subcutaneous Q24H  . DISCONTD: gabapentin  100-200 mg Oral BID  . DISCONTD: simvastatin  20 mg Oral q1800  . DISCONTD: temazepam  15 mg Oral QHS   Continuous Infusions:   . sodium chloride 75 mL/hr (12/21/11 0108)  . DISCONTD: sodium chloride     PRN Meds:.DISCONTD: senna-docusate  ASSESSMENT & PLAN: Principal Problem:  *TIA (transient ischemic attack) Active Problems:  Hypertension  Polymyalgia rheumatica  PAF (paroxysmal atrial fibrillation)  CVA Came in with facial numbness, MRI showed stroke in the right frontal lobe. Neurology is consulted, they recommended to start Pradaxa.  Modify her other risks, continue statins and BP control. Continue PT/OT/SLP  HTN Controlled, continue current home medications.  PAF Rate is controlled with Atenolol and Cardiazem, she started on Pradaxa.  PMR Continue steroids.   LOS: 1 day   Irby Fails A 12/21/2011, 3:41 PM

## 2011-12-21 NOTE — Progress Notes (Signed)
Speech Language Pathology Screen  Received evaluation and treatment order.  Patient passed the RN stroke swallow screen and all her symptoms have completely resolved per MD/RN notes. No evaluation completed.  Please re-order if status changes.  D/C SLP services.  Myra Rude, M.S.,CCC-SLP Pager [phone]

## 2011-12-21 NOTE — Evaluation (Signed)
Physical Therapy Evaluation Patient Details Name: Lorraine Little MRN: [id number] DOB: 01-05-1941 Today's Date: 12/21/2011  Problem List:  Patient Active Problem List  Diagnoses  . Polymyalgia  . Hypertension  . GERD (gastroesophageal reflux disease)  . Polymyalgia rheumatica  . Palpitations  . PAF (paroxysmal atrial fibrillation)  . TIA (transient ischemic attack)    Past Medical History:  Past Medical History  Diagnosis Date  . Polymyalgia rheumatica   . Hypertension   . GERD (gastroesophageal reflux disease)   . Premature atrial contraction   . Migraine headache   . Cervical disc disease   . Osteoporosis   . Glaucoma   . Osteoarthritis   . PAF (paroxysmal atrial fibrillation)     hospitalized with atrial fib with RVR in September of 2012  . Normal nuclear stress test Sept 2012  . Normal echocardiogram Sept 2012    mild MR with normal EF   Past Surgical History:  Past Surgical History  Procedure Date  . Breast lumpectomy 1980's    right  x2  . Tonsillectomy age 62    PT Assessment/Plan/Recommendation PT Assessment Clinical Impression Statement: Pt is a 71 y/o female admitted for left facial droop and left UE numbness.  Pt admitted for stroke workup with possible TIA.  Pt symptoms resolved and pt now completely independent.  Pt has no further PT needs or OT needs.  PT will sign off. PT Recommendation/Assessment: Patent does not need any further PT services No Skilled PT: All education completed;Patient at baseline level of functioning;Patient is independent with all acitivity/mobility PT Recommendation Follow Up Recommendations: No PT follow up Equipment Recommended: None recommended by PT PT Goals     PT Evaluation Precautions/Restrictions    Prior Functioning  Home Living Lives With: Spouse Receives Help From: Family Type of Home: House Home Layout: Two level;Laundry or work area in basement;Able to live on main level with bedroom/bathroom Alternate  Level Stairs-Rails: Right Alternate Level Stairs-Number of Steps: 12 Home Access: Stairs to enter Entrance Stairs-Rails: None Entrance Stairs-Number of Steps: 3 Bathroom Shower/Tub: Engineer, manufacturing systems: Standard Prior Function Level of Independence: Independent with basic ADLs;Independent with gait;Independent with transfers Able to Take Stairs?: Yes Driving: Yes Vocation: Retired Producer, television/film/video: Awake/alert Overall Cognitive Status: Appears within functional limits for tasks assessed Orientation Level: Oriented X4 Sensation/Coordination Sensation Light Touch: Appears Intact Extremity Assessment RUE Assessment RUE Assessment: Within Functional Limits LUE Assessment LUE Assessment: Within Functional Limits RLE Assessment RLE Assessment: Within Functional Limits LLE Assessment LLE Assessment: Within Functional Limits Mobility (including Balance) Bed Mobility Bed Mobility: Yes Supine to Sit: 7: Independent Transfers Transfers: Yes Sit to Stand: 7: Independent;From bed Stand to Sit: 7: Independent;To chair/3-in-1;To toilet Ambulation/Gait Ambulation/Gait: Yes Ambulation/Gait Assistance: 5: Supervision Ambulation/Gait Assistance Details (indicate cue type and reason): Pt needed initial supervision due to slow cadence and guarded gait.  Pt able to improve overall ambulation once ambulating  ~50 feet. Ambulation Distance (Feet): 300 Feet Assistive device: None Gait Pattern: Within Functional Limits Stairs: Yes Stairs Assistance: 7: Independent Stair Management Technique: Forwards Number of Stairs: 3   Balance Balance Assessed: Yes Static Sitting Balance Static Sitting - Balance Support: Feet supported Static Sitting - Level of Assistance: 7: Independent Static Sitting - Comment/# of Minutes: ~ 5 minutes prior to ambulation. Dynamic Gait Index Level Surface: Normal Change in Gait Speed: Normal Gait with Horizontal Head Turns:  Normal Gait with Vertical Head Turns: Normal Gait and Pivot Turn: Normal Step Over Obstacle: Normal Step Around  Obstacles: Normal Steps: Normal Total Score: 24  Exercise    End of Session PT - End of Session Equipment Utilized During Treatment: Gait belt Activity Tolerance: Patient tolerated treatment well Patient left: in chair;with call bell in reach Nurse Communication: Mobility status for transfers;Mobility status for ambulation General Behavior During Session: [facility] for tasks performed Cognition: [facility] for tasks performed  Antjuan Rothe 12/21/2011, 1:54 PM [phone]

## 2011-12-21 NOTE — Progress Notes (Signed)
Paged MRI results to Dr. Arthor Captain. Will continue to monitor patient. Rema Fendt, RN

## 2011-12-21 NOTE — Progress Notes (Signed)
Utilization review complete 

## 2011-12-21 NOTE — Progress Notes (Signed)
Subjective:  71 year old Caucasian lady admitted for evaluation for her D. The 45 minute episode yesterday of sudden onset of Left upper extremity paresthesias followed by left lower face numbness as well as slurred speech and facial droop. She has a history of paroxysmal atrial fibrillation and had been on Plavix for but was taken off it a month ago by her cardiologist. She was tolerating products are without significant side effects. She has no other history of strokes or TIAs..Vascular risk factors include hypertension, AFIB, age and sex  CT head shows ill-defined hypodensity which likely from changes of chronic microvascular ischemia.MRI is pending  Objective: Vital signs in last 24 hours: Temp:  [97.6 F (36.4 C)-99 F (37.2 C)] 98.7 F (37.1 C) (02/20 0800) Pulse Rate:  [60-76] 76  (02/20 0800) Resp:  [16-19] 18  (02/20 0800) BP: (107-139)/(56-74) 107/56 mmHg (02/20 0800) SpO2:  [97 %-100 %] 97 % (02/20 0800) Weight:  [58 kg (127 lb 13.9 oz)] 58 kg (127 lb 13.9 oz) (02/20 0000) Weight change:  Last BM Date:  (Pt not sure of LBM, this has been an issue for the patient)  Intake/Output from previous day: 02/19 0701 - 02/20 0700 In: 120 [P.O.:120] Out: -  Intake/Output this shift:    Physical exam reveals a pleasant elderly Caucasian lady currently not in distress. Afebrile. Head is nontraumatic. Neck is supple without bruit. Hearing appears normal. Cardiac exam sinus tachycardia no murmur or gallop. Lungs are clear to auscultation. Abdomen soft nontender. Neurological exam Awake  Alert oriented x 3. Normal speech and language.eye movements full without nystagmus. Face symmetric. Tongue midline. Normal strength, tone, reflexes and coordination. Normal sensation. Gait deferred.   Lab Results:  Basename 12/21/11 0550 12/20/11 2346  WBC 7.2 7.5  HGB 12.9 12.7  HCT 38.5 37.7  PLT 226 228   BMET  Basename 12/21/11 0550 12/20/11 2346 12/20/11 1947 12/20/11 1932  NA 143 -- 139 --   K 3.5 -- 4.1 --  CL 107 -- 103 --  CO2 27 -- -- 28  GLUCOSE 87 -- 92 --  BUN 15 -- 19 --  CREATININE 0.99 1.17* -- --  CALCIUM 9.1 -- -- 9.4    Studies/Results: Dg Chest 2 View  12/21/2011  *RADIOLOGY REPORT*  Clinical Data: Left-sided weakness; history of atrial fibrillation and hypertension.  CHEST - 2 VIEW  Comparison: Chest radiograph performed 12/20/2011  Findings: The lungs are well-aerated.  Blunting of the left costophrenic angle may reflect pleural thickening or a small left pleural effusion.  There is no evidence of focal opacification or pneumothorax.  The heart is normal in size; the mediastinal contour is within normal limits.  No acute osseous abnormalities are seen.  There is mild focal lateral vertebral body displacement at the upper lumbar spine, with associated degenerative change.  IMPRESSION:  1.  Blunting of the left costophrenic angle may reflect pleural thickening or small left pleural effusion; lungs otherwise clear. 2.  Mild degenerative change at the upper lumbar spine.  Original Report Authenticated By: Tonia Ghent, M.D.   Ct Head Wo Contrast  12/20/2011  *RADIOLOGY REPORT*  Clinical Data: Transient left facial droop today.  Persistent left- sided numbness.  CT HEAD WITHOUT CONTRAST  Technique:  Contiguous axial images were obtained from the base of the skull through the vertex without contrast.  Comparison: Brain MR dated 09/01/2010.  Findings: Stable mildly enlarged ventricles and subarachnoid spaces.  No significant change in patchy and small focal areas of low density in the  basal ganglia and white matter bilaterally. There is some subtle low density in the central pons and left upper midbrain region, laterally.  No intracranial hemorrhage, mass lesion or CT evidence of acute infarction.  Unremarkable bones and included paranasal sinuses.  IMPRESSION:  1. Subtle low density in the central pons and left upper midbrain region, laterally, possibly representing subtle  areas of ischemic change.  Otherwise, no acute abnormality. 2.  Stable atrophy and chronic small vessel ischemic changes.  These results were called by telephone on 12/20/2011  at  1947 hours to  Dr. Effie Shy, who verbally acknowledged these results.  Original Report Authenticated By: Darrol Angel, M.D.   Dg Chest Portable 1 View  12/20/2011  *RADIOLOGY REPORT*  Clinical Data: 72 year old female with stroke symptoms.  Right side weakness and slurred speech.  PORTABLE CHEST - 1 VIEW  Comparison: 07/15/2011.  Findings: Portable upright AP view at 2014 hours.  No pneumothorax, pulmonary edema, pleural effusion or consolidation.  Chronically large lung volumes.  Stable cardiac size and mediastinal contours.  IMPRESSION: No acute cardiopulmonary abnormality.  Original Report Authenticated By: Harley Hallmark, M.D.   CT-scan of the brain pontine low density likely chronic microvascular ischemia MRI examination of the brain pending     Medications: I have reviewed the patient's current medications.  Assessment/Plan: 22 lady with a right brain TIA likely cardioembolic from atrial fibrillation. Vascular risk factors of hypertension, atrial fibrillation,hyperlipidemia, age and sex.  Plan:  Recommend restarting Pradaxa 150 mg twice daily and add simvastatin for elevated lipids LOS: 1 day  Cardiology consult to agree with anticoagulation.I had a long discussion the patient and daughter regarding her presentation, plan ,anticoagulation treatment and discussed risk benefit of anticoagulation and answered questions Rasheen Bells,PRAMODKUMAR P 12/21/2011, 10:33 AM

## 2011-12-21 NOTE — Progress Notes (Signed)
  Echocardiogram 2D Echocardiogram has been performed.  Lorraine Little 12/21/2011, 11:59 AM

## 2011-12-21 NOTE — H&P (Signed)
Lorraine Little is an 71 y.o. female.   PCP - Dr.Perinni Cardiologist - Dr.Jordan. Chief Complaint: Left upper extremity numbness and left facial numbness. HPI: 71 year-old female with history of hypertension polymyalgia rheumatica, history of migraine headaches and paroxysmal atrial fibrillation was brought in the ER after patient had a sudden onset of left upper extremity numbness and a feeling of getting pulled the same thing happened to her left side of the face and neck. The incident happened around 6:30 PM last evening. In addition patient also has some slurring of speech and she felt her tongue was heavy. Patient's husband called EMS immediately and on the way to the hospital patient's symptoms improved and is completely resolved at this time. The whole incident lasted for half an hour. Neurologist on call was consulted. CT of the head is negative and patient has been admitted for further observation and management Patient did have some palpitations yesterday which got better after she took a dose of flecainide. Of recent patient has been experiencing some headache in the posterior auricular area and it lasts only few seconds and at this time patient has no headache.  Past Medical History  Diagnosis Date  . Polymyalgia rheumatica   . Hypertension   . GERD (gastroesophageal reflux disease)   . Premature atrial contraction   . Migraine headache   . Cervical disc disease   . Osteoporosis   . Glaucoma   . Osteoarthritis   . PAF (paroxysmal atrial fibrillation)     hospitalized with atrial fib with RVR in September of 2012  . Normal nuclear stress test Sept 2012  . Normal echocardiogram Sept 2012    mild MR with normal EF    Past Surgical History  Procedure Date  . Breast lumpectomy 1980's    right  x2  . Tonsillectomy age 63    Family History  Problem Relation Age of Onset  . Stroke Mother   . Cancer Father    Social History:  reports that she has never smoked. She does not  have any smokeless tobacco history on file. She reports that she drinks alcohol. She reports that she does not use illicit drugs.  Allergies: No Known Allergies  Medications Prior to Admission  Medication Dose Route Frequency Provider Last Rate Last Dose  . 0.9 %  sodium chloride infusion   Intravenous Continuous Eduard Clos, MD      . aspirin tablet 325 mg  325 mg Oral Daily Eduard Clos, MD      . atenolol (TENORMIN) tablet 50 mg  50 mg Oral Daily Eduard Clos, MD      . diltiazem (CARDIZEM CD) 24 hr capsule 120 mg  120 mg Oral Daily Eduard Clos, MD      . enoxaparin (LOVENOX) injection 40 mg  40 mg Subcutaneous Q24H Eduard Clos, MD      . fish oil-omega-3 fatty acids capsule 1 g  1 g Oral BID Eduard Clos, MD      . gabapentin (NEURONTIN) capsule 100-200 mg  100-200 mg Oral BID Eduard Clos, MD      . pantoprazole (PROTONIX) EC tablet 40 mg  40 mg Oral Daily Eduard Clos, MD      . potassium chloride (K-DUR,KLOR-CON) CR tablet 10 mEq  10 mEq Oral Daily Eduard Clos, MD      . predniSONE (DELTASONE) tablet 5 mg  5 mg Oral Daily Eduard Clos, MD      .  temazepam (RESTORIL) capsule 15 mg  15 mg Oral QHS Eduard Clos, MD      . DISCONTD: 0.9 %  sodium chloride infusion   Intravenous Continuous Rajani Caesar, MD      . DISCONTD: aspirin suppository 300 mg  300 mg Rectal Daily Rajani Caesar, MD      . DISCONTD: aspirin tablet 325 mg  325 mg Oral Daily Rajani Caesar, MD      . DISCONTD: enoxaparin (LOVENOX) injection 30 mg  30 mg Subcutaneous Q24H Rajani Caesar, MD      . DISCONTD: senna-docusate (Senokot-S) tablet 1 tablet  1 tablet Oral QHS PRN Carmelina Peal, MD       Medications Prior to Admission  Medication Sig Dispense Refill  . alendronate (FOSAMAX) 70 MG tablet Take 70 mg by mouth every 7 (seven) days. Take with a full glass of water on an empty stomach. Take on Wednesday      . aspirin EC 81 MG  tablet Take 81 mg by mouth daily.      Marland Kitchen atenolol (TENORMIN) 50 MG tablet Take 50 mg by mouth daily.       . Calcium Carbonate-Vitamin D (CALCIUM + D) 600-200 MG-UNIT TABS Take 1 tablet by mouth 2 (two) times daily.       Marland Kitchen diltiazem (CARDIZEM CD) 120 MG 24 hr capsule Take 120 mg by mouth daily.       . fish oil-omega-3 fatty acids 1000 MG capsule Take 1 g by mouth 2 (two) times daily.       . flecainide (TAMBOCOR) 100 MG tablet Take 100mg  1 tablet prn onset of atrial fib.  30 tablet  6  . gabapentin (NEURONTIN) 100 MG capsule Take 100-200 mg by mouth 2 (two) times daily. Take 100mg  at noon daily and take 200mg  every night at bedtime      . pantoprazole (PROTONIX) 40 MG tablet Take 40 mg by mouth daily.       . potassium chloride (K-DUR,KLOR-CON) 10 MEQ tablet Take 10 mEq by mouth daily.      . predniSONE (DELTASONE) 5 MG tablet Take 5 mg by mouth daily.       . Sennosides (SENNA LAX PO) Take 1 tablet by mouth at bedtime.       . temazepam (RESTORIL) 15 MG capsule Take 15 mg by mouth at bedtime.       . triamterene-hydrochlorothiazide (DYAZIDE) 37.5-25 MG per capsule 0.5 capsules every morning. Takes 1/2 tablet daily      . Vitamin D, Ergocalciferol, (DRISDOL) 50000 UNITS CAPS Take 50,000 Units by mouth every 14 (fourteen) days.        Results for orders placed during the hospital encounter of 12/20/11 (from the past 48 hour(s))  PROTIME-INR     Status: Normal   Collection Time   12/20/11  7:32 PM      Component Value Range Comment   Prothrombin Time 12.5  11.6 - 15.2 (seconds)    INR 0.91  0.00 - 1.49    APTT     Status: Normal   Collection Time   12/20/11  7:32 PM      Component Value Range Comment   aPTT 25  24 - 37 (seconds)   CBC     Status: Normal   Collection Time   12/20/11  7:32 PM      Component Value Range Comment   WBC 7.7  4.0 - 10.5 (K/uL)    RBC 4.21  3.87 -  5.11 (MIL/uL)    Hemoglobin 12.5  12.0 - 15.0 (g/dL)    HCT 16.1  09.6 - 04.5 (%)    MCV 91.2  78.0 - 100.0 (fL)     MCH 29.7  26.0 - 34.0 (pg)    MCHC 32.6  30.0 - 36.0 (g/dL)    RDW 40.9  81.1 - 91.4 (%)    Platelets 214  150 - 400 (K/uL)   DIFFERENTIAL     Status: Normal   Collection Time   12/20/11  7:32 PM      Component Value Range Comment   Neutrophils Relative 67  43 - 77 (%)    Neutro Abs 5.1  1.7 - 7.7 (K/uL)    Lymphocytes Relative 23  12 - 46 (%)    Lymphs Abs 1.7  0.7 - 4.0 (K/uL)    Monocytes Relative 10  3 - 12 (%)    Monocytes Absolute 0.7  0.1 - 1.0 (K/uL)    Eosinophils Relative 0  0 - 5 (%)    Eosinophils Absolute 0.0  0.0 - 0.7 (K/uL)    Basophils Relative 0  0 - 1 (%)    Basophils Absolute 0.0  0.0 - 0.1 (K/uL)   COMPREHENSIVE METABOLIC PANEL     Status: Abnormal   Collection Time   12/20/11  7:32 PM      Component Value Range Comment   Sodium 138  135 - 145 (mEq/L)    Potassium 4.1  3.5 - 5.1 (mEq/L)    Chloride 100  96 - 112 (mEq/L)    CO2 28  19 - 32 (mEq/L)    Glucose, Bld 90  70 - 99 (mg/dL)    BUN 17  6 - 23 (mg/dL)    Creatinine, Ser 7.82 (*) 0.50 - 1.10 (mg/dL)    Calcium 9.4  8.4 - 10.5 (mg/dL)    Total Protein 6.5  6.0 - 8.3 (g/dL)    Albumin 3.5  3.5 - 5.2 (g/dL)    AST 19  0 - 37 (U/L)    ALT 12  0 - 35 (U/L)    Alkaline Phosphatase 54  39 - 117 (U/L)    Total Bilirubin 0.3  0.3 - 1.2 (mg/dL)    GFR calc non Af Amer 43 (*) >90 (mL/min)    GFR calc Af Amer 50 (*) >90 (mL/min)   CK TOTAL AND CKMB     Status: Normal   Collection Time   12/20/11  7:33 PM      Component Value Range Comment   Total CK 45  7 - 177 (U/L)    CK, MB 1.7  0.3 - 4.0 (ng/mL)    Relative Index RELATIVE INDEX IS INVALID  0.0 - 2.5    TROPONIN I     Status: Normal   Collection Time   12/20/11  7:33 PM      Component Value Range Comment   Troponin I <0.30  <0.30 (ng/mL)   POCT I-STAT, CHEM 8     Status: Abnormal   Collection Time   12/20/11  7:47 PM      Component Value Range Comment   Sodium 139  135 - 145 (mEq/L)    Potassium 4.1  3.5 - 5.1 (mEq/L)    Chloride 103  96 - 112  (mEq/L)    BUN 19  6 - 23 (mg/dL)    Creatinine, Ser 9.56 (*) 0.50 - 1.10 (mg/dL)    Glucose, Bld 92  70 -  99 (mg/dL)    Calcium, Ion 1.47  1.12 - 1.32 (mmol/L)    TCO2 30  0 - 100 (mmol/L)    Hemoglobin 13.6  12.0 - 15.0 (g/dL)    HCT 82.9  56.2 - 13.0 (%)   GLUCOSE, CAPILLARY     Status: Normal   Collection Time   12/20/11  8:20 PM      Component Value Range Comment   Glucose-Capillary 81  70 - 99 (mg/dL)   POCT PREGNANCY, URINE     Status: Normal   Collection Time   12/20/11  9:20 PM      Component Value Range Comment   Preg Test, Ur NEGATIVE  NEGATIVE    CBC     Status: Normal   Collection Time   12/20/11 11:46 PM      Component Value Range Comment   WBC 7.5  4.0 - 10.5 (K/uL)    RBC 4.19  3.87 - 5.11 (MIL/uL)    Hemoglobin 12.7  12.0 - 15.0 (g/dL)    HCT 86.5  78.4 - 69.6 (%)    MCV 90.0  78.0 - 100.0 (fL)    MCH 30.3  26.0 - 34.0 (pg)    MCHC 33.7  30.0 - 36.0 (g/dL)    RDW 29.5  28.4 - 13.2 (%)    Platelets 228  150 - 400 (K/uL)    Ct Head Wo Contrast  12/20/2011  *RADIOLOGY REPORT*  Clinical Data: Transient left facial droop today.  Persistent left- sided numbness.  CT HEAD WITHOUT CONTRAST  Technique:  Contiguous axial images were obtained from the base of the skull through the vertex without contrast.  Comparison: Brain MR dated 09/01/2010.  Findings: Stable mildly enlarged ventricles and subarachnoid spaces.  No significant change in patchy and small focal areas of low density in the basal ganglia and white matter bilaterally. There is some subtle low density in the central pons and left upper midbrain region, laterally.  No intracranial hemorrhage, mass lesion or CT evidence of acute infarction.  Unremarkable bones and included paranasal sinuses.  IMPRESSION:  1. Subtle low density in the central pons and left upper midbrain region, laterally, possibly representing subtle areas of ischemic change.  Otherwise, no acute abnormality. 2.  Stable atrophy and chronic small vessel  ischemic changes.  These results were called by telephone on 12/20/2011  at  1947 hours to  Dr. Effie Shy, who verbally acknowledged these results.  Original Report Authenticated By: Darrol Angel, M.D.   Dg Chest Portable 1 View  12/20/2011  *RADIOLOGY REPORT*  Clinical Data: 71 year old female with stroke symptoms.  Right side weakness and slurred speech.  PORTABLE CHEST - 1 VIEW  Comparison: 07/15/2011.  Findings: Portable upright AP view at 2014 hours.  No pneumothorax, pulmonary edema, pleural effusion or consolidation.  Chronically large lung volumes.  Stable cardiac size and mediastinal contours.  IMPRESSION: No acute cardiopulmonary abnormality.  Original Report Authenticated By: Harley Hallmark, M.D.    Review of Systems  Constitutional: Negative.   HENT: Negative.   Eyes: Negative.   Respiratory: Negative.   Cardiovascular: Negative.   Gastrointestinal: Negative.   Genitourinary: Negative.   Musculoskeletal: Negative.   Skin: Negative.   Neurological: Positive for tingling.  Endo/Heme/Allergies: Negative.   Psychiatric/Behavioral: Negative.     Blood pressure 125/74, pulse 67, temperature 98.3 F (36.8 C), temperature source Oral, resp. rate 19, height 5\' 3"  (1.6 m), weight 58 kg (127 lb 13.9 oz), SpO2 100.00%. Physical Exam  Constitutional: She is oriented to person, place, and time. She appears well-developed and well-nourished. No distress.  HENT:  Head: Normocephalic and atraumatic.  Right Ear: External ear normal.  Left Ear: External ear normal.  Nose: Nose normal.  Mouth/Throat: Oropharynx is clear and moist. No oropharyngeal exudate.  Eyes: Conjunctivae are normal. Pupils are equal, round, and reactive to light. Right eye exhibits no discharge. Left eye exhibits no discharge. No scleral icterus.  Neck: Normal range of motion. Neck supple.  Cardiovascular: Normal rate and regular rhythm.   Respiratory: Effort normal and breath sounds normal. No respiratory distress. She  has no wheezes. She has no rales.  GI: Soft. Bowel sounds are normal. She exhibits no distension. There is no tenderness. There is no rebound.  Musculoskeletal: Normal range of motion. She exhibits no edema and no tenderness.  Neurological: She is alert and oriented to person, place, and time.       Moves upper and lower extremities 5/5.  Skin: Skin is warm and dry. No rash noted. She is not diaphoretic. No erythema.  Psychiatric: Her behavior is normal.     Assessment/Plan #1. TIA - neurologist has already evaluated the patient. The patient is not a candidate for TPA as her symptoms had completely resolved. Patient will place on neuro checks for evaluation MRI/MRA of the brain carotid Doppler and 2-D echo. Presently patient is on aspirin and patient may be a candidate for Coumadin/Xaralta/Pradaxa given her paroxysmal atrial fibrillation. Patient is in sinus rhythm now. #2. Paroxysmal atrial fibrillation presently in sinus rhythm and rate controlled - continue present medications and as suggested in #1 May consider Coumadin/Xaralta/Pradaxa after discussing the neurologist and her cardiologist. #3. History of hypertension - we'll continue the Cardizem and Toprol but hold of her diuretics for now. #4. History of polymyalgia rheumatica - continue present dose of steroids.  CODE STATUS - full code.  Kannan Proia N. 12/21/2011, 1:02 AM

## 2011-12-21 NOTE — Progress Notes (Signed)
*  PRELIMINARY RESULTS* Vascular Ultrasound Carotid Duplex (Doppler) has been completed.  Preliminary findings: Bilateral:  No evidence of hemodynamically significant internal carotid artery stenosis.   Vertebral artery flow is antegrade.      Farrel Demark RDMS 12/21/2011, 3:40 PM

## 2011-12-22 DIAGNOSIS — E785 Hyperlipidemia, unspecified: Secondary | ICD-10-CM | POA: Diagnosis present

## 2011-12-22 LAB — BASIC METABOLIC PANEL
CO2: 32 mEq/L (ref 19–32)
Calcium: 9.2 mg/dL (ref 8.4–10.5)
GFR calc Af Amer: 63 mL/min — ABNORMAL LOW (ref >90)
GFR calc non Af Amer: 54 mL/min — ABNORMAL LOW (ref >90)
Sodium: 141 mEq/L (ref 135–145)

## 2011-12-22 LAB — GLUCOSE, CAPILLARY: Glucose-Capillary: 86 mg/dL (ref 70–99)

## 2011-12-22 MED ORDER — DABIGATRAN ETEXILATE MESYLATE 150 MG PO CAPS: 150.0000 mg | ORAL_CAPSULE | Freq: Two times a day (BID) | ORAL | Status: DC

## 2011-12-22 NOTE — Discharge Summary (Signed)
HOSPITAL DISCHARGE SUMMARY  Lorraine Little  MRN: [id number]  DOB:[birth year]  Date of Admission: 12/20/2011 Date of Discharge: 12/22/2011         LOS: 2 days   Attending Physician:Rigel Filsinger A  Patient's HYQ:MVHQIO,NGEX A, MD, MD  Consults: Peter Swaziland with [facility] cardiology Dr. Lina Sayre with the stroke service  Discharge Diagnoses: Present on Admission:  .CVA (cerebral infarction) .Hypertension .PAF (paroxysmal atrial fibrillation) .Polymyalgia rheumatica .Dyslipidemia   Medication List  As of 12/22/2011 12:36 PM   STOP taking these medications         aspirin EC 81 MG tablet         TAKE these medications         alendronate 70 MG tablet   Commonly known as: FOSAMAX   Take 70 mg by mouth every 7 (seven) days. Take with a full glass of water on an empty stomach. Take on Wednesday      atenolol 50 MG tablet   Commonly known as: TENORMIN   Take 50 mg by mouth daily.      Calcium + D 600-200 MG-UNIT Tabs   Generic drug: Calcium Carbonate-Vitamin D   Take 1 tablet by mouth 2 (two) times daily.      dabigatran 150 MG Caps   Commonly known as: PRADAXA   Take 1 capsule (150 mg total) by mouth every 12 (twelve) hours.      diltiazem 120 MG 24 hr capsule   Commonly known as: CARDIZEM CD   Take 120 mg by mouth daily.      fish oil-omega-3 fatty acids 1000 MG capsule   Take 1 g by mouth 2 (two) times daily.      flecainide 100 MG tablet   Commonly known as: TAMBOCOR   Take 100mg  1 tablet prn onset of atrial fib.      gabapentin 100 MG capsule   Commonly known as: NEURONTIN   Take 100-200 mg by mouth 2 (two) times daily. Take 100mg  at noon daily and take 200mg  every night at bedtime      pantoprazole 40 MG tablet   Commonly known as: PROTONIX   Take 40 mg by mouth daily.      potassium chloride 10 MEQ tablet   Commonly known as: K-DUR,KLOR-CON   Take 10 mEq by mouth daily.      predniSONE 5 MG tablet   Commonly known as: DELTASONE   Take 5 mg by mouth  daily.      SENNA LAX PO   Take 1 tablet by mouth at bedtime.      temazepam 15 MG capsule   Commonly known as: RESTORIL   Take 15 mg by mouth at bedtime.      triamterene-hydrochlorothiazide 37.5-25 MG per capsule   Commonly known as: DYAZIDE   0.5 capsules every morning. Takes 1/2 tablet daily      Vitamin D (Ergocalciferol) 50000 UNITS Caps   Commonly known as: DRISDOL   Take 50,000 Units by mouth every 14 (fourteen) days.             Brief Admission History: 71 year-old female with history of hypertension polymyalgia rheumatica, history of migraine headaches and paroxysmal atrial fibrillation was brought in the ER after patient had a sudden onset of left upper extremity numbness and a feeling of getting pulled the same thing happened to her left side of the face and neck. The incident happened around 6:30 PM last evening. In addition patient also has some slurring  of speech and she felt her tongue was heavy. Patient's husband called EMS immediately and on the way to the hospital patient's symptoms improved and is completely resolved at this time. The whole incident lasted for half an hour. Neurologist on call was consulted. CT of the head is negative and patient has been admitted for further observation and management. Patient did have some palpitations yesterday which got better after she took a dose of flecainide. Of recent patient has been experiencing some headache in the posterior auricular area and it lasts only few seconds and at this time patient has no headache.  Hospital Course: Present on Admission:  .CVA (cerebral infarction) .Hypertension .PAF (paroxysmal atrial fibrillation) .Polymyalgia rheumatica .Dyslipidemia  1. CVA: None hemorrhagic infarct involving the posterior part of the right frontal lobe, as mentioned above the patient was presented to the hospital with a left upper extremity numbness and left facial numbness. These symptoms resolved completely on  patient evaluated in the emergency department it was thought initially it TIA. Stroke service was consulted. They recommended workup for stroke. The MRI showed no hemorrhagic infarct. Patient did have 2-D echocardiogram which showed no intra-ventricular thrombus, telemetry showed atrial fibrillation which back to NSR. Carotid duplex no evidence of significant carotid stenosis.  2. Paroxysmal atrial fibrillation: Patient was taking Pradaxa before she was switched to low-dose aspirin by her cardiologist, likely because of her low risk. After patient came in with CVA her CHADS2 score increased to 3 because of HTN and CVA. Patient started back on PRADAXA and aspirin was discontinued.  3. Dyslipidemia: Patient is taking fish oil at home, I spoke with patient to start statin medication, to keep the LDL less than 100. Patient reluctant to start anything can affect her muscle because of her history of polymyalgia rheumatica. I'll let her primary care physician to start a statin of his choice.  4. HTN: Controlled, no changes in medication was done.  5. PMR: Patient is on chronic steroids. At continued without any changes to her regimen.   Day of Discharge BP 101/55  Pulse 70  Temp(Src) 97.8 F (36.6 C) (Oral)  Resp 20  Ht 5\' 3"  (1.6 m)  Wt 58 kg (127 lb 13.9 oz)  BMI 22.65 kg/m2  SpO2 99% Physical Exam: GEN: No acute distress, cooperative with exam PSYCH: He is alert and oriented x4; does not appear anxious does not appear depressed; affect is normal  HEENT: Mucous membranes pink and anicteric;  Mouth: without oral thrush or lesions Eyes: PERRLA; EOM intact;  Neck: no cervical lymphadenopathy nor thyromegaly or carotid bruit; no JVD;  CHEST WALL: No tenderness, symmetrical to breathing bilaterally CHEST: Normal respiration, clear to auscultation bilaterally  HEART: Regular rate and rhythm; no murmurs, rubs or gallops, S1 and S2 heard  BACK: No kyphosis or scoliosis; no CVA tenderness  ABDOMEN:   soft non-tender; no masses, no organomegaly, normal abdominal bowel sounds; no pannus; no intertriginous candida.  EXTREMITIES: No bone or joint deformity; no edema; no ulcerations.  PULSES: 2+ and symmetric, neurovascularity is intact SKIN: Normal hydration no rash or ulceration, no flushing or suspicious lesions  CNS: Cranial nerves 2-12 grossly intact no focal neurologic deficit, coordination is intact gait not tested  Fasting lipid profile Total Cholesterol 232 (H) 0 - 200 mg/dL  Triglycerides 60  <161 mg/dL  HDL 87  >09 mg/dL  Total CHOL/HDL Ratio 2.7 RATIO VLDL 12  0 - 40 mg/dL LDL Cholesterol 604 (H)  0 - 99 mg/dL   Results  for orders placed during the hospital encounter of 12/20/11 (from the past 24 hour(s))  GLUCOSE, CAPILLARY     Status: Normal   Collection Time   12/21/11 10:32 PM      Component Value Range   Glucose-Capillary 84  70 - 99 (mg/dL)  BASIC METABOLIC PANEL     Status: Abnormal   Collection Time   12/22/11  6:09 AM      Component Value Range   Sodium 141  135 - 145 (mEq/L)   Potassium 3.7  3.5 - 5.1 (mEq/L)   Chloride 105  96 - 112 (mEq/L)   CO2 32  19 - 32 (mEq/L)   Glucose, Bld 90  70 - 99 (mg/dL)   BUN 15  6 - 23 (mg/dL)   Creatinine, Ser 4.09  0.50 - 1.10 (mg/dL)   Calcium 9.2  8.4 - 81.1 (mg/dL)   GFR calc non Af Amer 54 (*) >90 (mL/min)   GFR calc Af Amer 63 (*) >90 (mL/min)  GLUCOSE, CAPILLARY     Status: Normal   Collection Time   12/22/11  7:19 AM      Component Value Range   Glucose-Capillary 86  70 - 99 (mg/dL)  GLUCOSE, CAPILLARY     Status: Normal   Collection Time   12/22/11 11:31 AM      Component Value Range   Glucose-Capillary 82  70 - 99 (mg/dL)    Disposition: Home.   Follow-up Appts: Discharge Orders    Future Orders Please Complete By Expires   Diet - low sodium heart healthy      Increase activity slowly         Follow-up Information    Follow up with Peter Swaziland, MD. (Dr. Elvis Coil office will be calling you for a  follow-up appointment)    Contact information:   [address], [address] [place] [address] [phone]       Follow up with Rodrigo Ran A, MD. Schedule an appointment as soon as possible for a visit in 1 week.   Contact information:   [address] [facility] [place] [address] [phone]          I spent 40 minutes completing paperwork and coordinating discharge efforts.  SignedClydia Llano A 12/22/2011, 12:36 PM

## 2011-12-22 NOTE — Progress Notes (Signed)
Occupational Therapy Note  Pt admitted for stroke workup with possible TIA.  Per PT report, pt's symptoms have resolved, and pt is independent. No acute OT needed. Please re-order if needed.  Thank you!  12/22/2011 Cipriano Mile OTR/L Pager [phone] Office [phone]

## 2011-12-22 NOTE — Progress Notes (Signed)
History: 71 year old Caucasian lady admitted for evaluation for her D. The 45 minute episode yesterday of sudden onset of Left upper extremity paresthesias followed by left lower face numbness as well as slurred speech and facial droop. She has a history of paroxysmal atrial fibrillation and had been on Plavix for but was taken off it a month ago by her cardiologist. She was tolerating products are without significant side effects. She has no other history of strokes or TIAs..Vascular risk factors include hypertension, AFIB, age and sex  CT head shows ill-defined hypodensity which likely from changes of chronic microvascular ischemia.  Subjective: Doing well this am.  Speech improved, facial droop resolved.  Patient was seen by cardiologist,  Dr. [place], and he  agreed with need for anticoagulation now given increased CHADS2 score of 2. CrCl is 54 so agree with dose of Pradaxa 150mg  bid.   They also noted max dose of Zocor while on concurrent Diltiazem is 10mg  daily so will decrease Zocor. Consider change to Lipitor or Crestor at discharge.  Objective: Vital signs in last 24 hours: Temp:  [97.5 F (36.4 C)-99.7 F (37.6 C)] 97.8 F (36.6 C) (02/21 0944) Pulse Rate:  [67-71] 70  (02/21 0944) Resp:  [18-20] 20  (02/21 0944) BP: (101-125)/(55-75) 101/55 mmHg (02/21 0944) SpO2:  [97 %-99 %] 99 % (02/21 0944) Weight change:  Last BM Date: 12/21/11  Intake/Output from previous day: 02/20 0701 - 02/21 0700 In: 540 [P.O.:240; I.V.:300] Out: -  Intake/Output this shift: Total I/O In: 240 [P.O.:240] Out: -   Physical exam reveals a pleasant elderly Caucasian lady currently not in distress. Fluent speech pattern, no asphasia.   Afebrile.   Neck is supple without bruit.   Neurological exam Awake  Alert oriented x 3. Normal speech and language.eye movements full without nystagmus. Face symmetric. Tongue midline. Normal strength, tone, reflexes and coordination. Normal sensation. Gait deferred.    Lab Results:  Basename 12/21/11 0550 12/20/11 2346  WBC 7.2 7.5  HGB 12.9 12.7  HCT 38.5 37.7  PLT 226 228   BMET  Basename 12/22/11 0609 12/21/11 0550  NA 141 143  K 3.7 3.5  CL 105 107  CO2 32 27  GLUCOSE 90 87  BUN 15 15  CREATININE 1.01 0.99  CALCIUM 9.2 9.1    Studies/Results:  12/20/2011  CT HEAD WITHOUT CONTRAST   Findings: Stable mildly enlarged ventricles and subarachnoid spaces.  No significant change in patchy and small focal areas of low density in the basal ganglia and white matter bilaterally. There is some subtle low density in the central pons and left upper midbrain region, laterally.  No intracranial hemorrhage, mass lesion or CT evidence of acute infarction.  Unremarkable bones and included paranasal sinuses.  IMPRESSION:  1. Subtle low density in the central pons and left upper midbrain region, laterally, possibly representing subtle areas of ischemic change.  Otherwise, no acute abnormality. 2.  Stable atrophy and chronic small vessel ischemic changes.   Darrol Angel, M.D.   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  Remote basal ganglia and right corona radiata infarcts.  Prominent small vessel disease type changes.  No intracranial hemorrhage.  Global atrophy without hydrocephalus.  No intracranial mass lesion detected on this unenhanced exam.  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  Major intracranial vascular structures are patent.  IMPRESSION: Acute non hemorrhagic infarct posterior right  frontal lobe (motor strip).    Fuller Canada, M.D.  12/21/2011 MRA HEAD  Findings: Irregularity of the mid to distal vertical cervical segment of the internal carotid arteries bilaterally may be related to artifact limiting evaluation for detection of stenosis or fibromuscular dysplasia.  Anterior circulation without large size vessel significant  stenosis or occlusion.  Decreased number of visualized right middle cerebral artery branch vessels.  Middle cerebral artery branch vessel irregularity bilaterally.  Ectatic vertebral arteries and basilar artery.  Mild irregularity of the vertebral arteries and basilar artery without high-grade stenosis.  Nonvisualization AICAs.  Tiny bulge of the distal basilar artery directed laterally. Tiny aneurysm not excluded.  This may represent result of limitation of the present exam.  Superior cerebellar artery and posterior cerebral artery mild to moderate distal branch vessel irregularity.  IMPRESSION: Intracranial atherosclerotic type changes as detailed above.   Fuller Canada, M.D.   Assessment/Plan: 71 lady with: 1. 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  2. Paroxysmal Atrial Fibrillation- rate controlled. 3. HTN- controlled.  Vascular risk factors of hypertension, atrial Fibrillation, hyperlipidemia, age and sex.  Plan:  1. Pradaxa 150 mg BID 2. D/C ASA and Lovenox 3. Follow up as OP with Dr. Pearlean Brownie in 2 months.  No further neurologic intervention is recommended at this time.  If further questions arise, please call or page at that time.  Thank you for allowing neurology to participate in the care of this patient.  LOS: 2 days   JERNEJCIC,TARA C 12/22/2011, 9:50 AM [facility] [phone]   Patient has been seen and examined by Dr. Pearlean Brownie who agrees with the above recommendation.

## 2011-12-22 NOTE — Progress Notes (Signed)
 Cardiology  We will be available as needed for questions. Our office will contact Ms. Skillin for an appointment in the next 2 weeks for follow-up.  Dayna Dunn PA-C

## 2011-12-22 NOTE — Progress Notes (Signed)
Pt's assessment unchanged from AM. Pt given D/C instructions with stroke education. Pt D/C'd home via wheelchair @ 1445 per MD order. Rema Fendt, RN

## 2012-01-05 ENCOUNTER — Encounter: Payer: Self-pay | Admitting: Internal Medicine

## 2012-01-05 ENCOUNTER — Ambulatory Visit (INDEPENDENT_AMBULATORY_CARE_PROVIDER_SITE_OTHER): Payer: Medicare Other | Admitting: Internal Medicine

## 2012-01-05 VITALS — BP 116/52 | HR 60 | Wt 127.0 lb

## 2012-01-05 DIAGNOSIS — I6350 Cerebral infarction due to unspecified occlusion or stenosis of unspecified cerebral artery: Secondary | ICD-10-CM

## 2012-01-05 DIAGNOSIS — I639 Cerebral infarction, unspecified: Secondary | ICD-10-CM

## 2012-01-05 DIAGNOSIS — I4891 Unspecified atrial fibrillation: Secondary | ICD-10-CM

## 2012-01-05 DIAGNOSIS — E785 Hyperlipidemia, unspecified: Secondary | ICD-10-CM

## 2012-01-05 DIAGNOSIS — I480 Paroxysmal atrial fibrillation: Secondary | ICD-10-CM

## 2012-01-05 MED ORDER — PROPAFENONE HCL ER 225 MG PO CP12: 225.0000 mg | ORAL_CAPSULE | Freq: Two times a day (BID) | ORAL | Status: DC

## 2012-01-05 NOTE — Assessment & Plan Note (Signed)
Needs long term anticoagulation

## 2012-01-05 NOTE — Assessment & Plan Note (Addendum)
Documented afib with now CVA  Has signif assoc symptoms and so rhythm control was discussed incl risk of proarrhythmia  She is agreeable to that strategy;  We also discussed role of RFCA as following drug therapy  Will choose rhyhtmol as concerned a little about the syncopal episode while on flecanide  Have reviewed drug side effects.  Will need post initiation GXT

## 2012-01-05 NOTE — Assessment & Plan Note (Signed)
Started on statin now w some myalgias  prob not PMR  Have suggested she try and reduce frequency of statin to 3xweek

## 2012-01-05 NOTE — Progress Notes (Signed)
History and Physical  Patient ID: Lorraine Little MRN: [id number], SOB: [birth year] 72 y.o. Date of Encounter: 01/05/2012, 3:57 PM  Primary Physician: Ezequiel Kayser, MD, MD Primary Cardiologist: PJ Primary Electrophysiologist:  SK  Chief Complaint: Bonner Puna  History of Present Illness: Lorraine Little is a 71 y.o. female seen for afib  Originally symptomatic in 3/12 and documented 9/12 when presented with RVR  Rx with rate control and pradaxa which was then stopped a month or so later.  She had a syncopal episode a couple of months later having taken a single dose of flecanide(200 mg)  In Feb she had a CVA from whch she has recovered fully. Pradaxa was reiinitiated     Cardiac eval has incl myoview and echo normal with LA dim 33mm  CHADSVASC was 3 and now 5 (TIA-2, HTN-1,Gender-1, age-13)   Past Medical History  Diagnosis Date  . Polymyalgia rheumatica     On chronic steroids  . Hypertension   . GERD (gastroesophageal reflux disease)   . Migraine headache   . Cervical disc disease   . Osteoporosis   . Glaucoma   . Osteoarthritis   . PAF (paroxysmal atrial fibrillation)     Diagnosed 07/2011. Normal nuclear myoview 07/2011 and normal EF at that time. Most recent echo 2/20 /13 - EF 55-60% wit mild-mod MR.  Marland Kitchen Hyperlipidemia     Diagnosed 12/2011     Past Surgical History  Procedure Date  . Breast lumpectomy 1980's    right  x2  . Tonsillectomy age 33      Current Outpatient Prescriptions  Medication Sig Dispense Refill  . alendronate (FOSAMAX) 70 MG tablet Take 70 mg by mouth every 7 (seven) days. Take with a full glass of water on an empty stomach. Take on Wednesday      . atenolol (TENORMIN) 50 MG tablet Take 50 mg by mouth daily.       Marland Kitchen atorvastatin (LIPITOR) 20 MG tablet Take 20 mg by mouth daily.      . Calcium Carbonate-Vitamin D (CALCIUM + D) 600-200 MG-UNIT TABS Take 1 tablet by mouth daily.       . dabigatran (PRADAXA) 150 MG CAPS Take 1 capsule (150 mg total)  by mouth every 12 (twelve) hours.  60 capsule  0  . diltiazem (CARDIZEM CD) 120 MG 24 hr capsule Take 120 mg by mouth daily.       . fish oil-omega-3 fatty acids 1000 MG capsule Take 1 g by mouth 2 (two) times daily.       . flecainide (TAMBOCOR) 100 MG tablet Take 100mg  1 tablet prn onset of atrial fib.  30 tablet  6  . gabapentin (NEURONTIN) 100 MG capsule Take 100-200 mg by mouth 2 (two) times daily. Take 100mg  at noon daily and take 200mg  every night at bedtime      . pantoprazole (PROTONIX) 40 MG tablet Take 40 mg by mouth daily.       . potassium chloride (K-DUR,KLOR-CON) 10 MEQ tablet Take 10 mEq by mouth daily.      . predniSONE (DELTASONE) 5 MG tablet Take 5 mg by mouth daily.       . Sennosides (SENNA LAX PO) Take 1 tablet by mouth at bedtime.       . temazepam (RESTORIL) 15 MG capsule Take 15 mg by mouth at bedtime.       . triamterene-hydrochlorothiazide (DYAZIDE) 37.5-25 MG per capsule 0.5 capsules every morning. Takes 1/2 tablet daily      .  Vitamin D, Ergocalciferol, (DRISDOL) 50000 UNITS CAPS Take 50,000 Units by mouth every 14 (fourteen) days.         Allergies: No Known Allergies   History  Substance Use Topics  . Smoking status: Never Smoker   . Smokeless tobacco: Not on file  . Alcohol Use: Yes     SOCIAL      Family History  Problem Relation Age of Onset  . Stroke Mother   . Cancer Father       ROS:  Please see the history of present illness.    All other systems reviewed and negative.   Vital Signs: Blood pressure 116/52, pulse 60, weight 127 lb (57.607 kg).  PHYSICAL EXAM: General:  Well nourished, well developed female in no acute distress  HEENT: normal Lymph: no adenopathy Neck: no JVD Endocrine:  No thryomegaly Vascular: No carotid bruits; FA pulses 2+ bilaterally without bruits Cardiac:  normal S1, S2; RRR; no murmur Back: without kyphosis/scoliosis, no CVA tenderness Lungs:  clear to auscultation bilaterally, no wheezing, rhonchi or  rales Abd: soft, nontender, no hepatomegaly Ext: no edema Musculoskeletal:  No deformities, BUE and BLE strength normal and equal Skin: warm and dry Neuro:  CNs 2-12 intact, no focal abnormalities noted Psych:  Normal affect   EKG:  2/13 NSR with PR 204 o/w normal patterns  Labs:   Lab Results  Component Value Date   WBC 7.2 12/21/2011   HGB 12.9 12/21/2011   HCT 38.5 12/21/2011   MCV 90.2 12/21/2011   PLT 226 12/21/2011   No results found for this basename: NA,K,CL,CO2,BUN,CREATININE,CALCIUM,LABALBU,PROT,BILITOT,ALKPHOS,ALT,AST,GLUCOSE in the last 168 hours No results found for this basename: CKTOTAL:4,CKMB:4,TROPONINI:4 in the last 72 hours Lab Results  Component Value Date   CHOL 232* 12/21/2011   HDL 87 12/21/2011   LDLCALC 133* 12/21/2011   TRIG 60 12/21/2011   No results found for this basename: DDIMER   BNP Pro B Natriuretic peptide (BNP)  Date/Time Value Range Status  07/17/2011  6:29 AM 914.1* 0-125 (pg/mL) Final  07/16/2011  3:37 AM 2392.0* 0-125 (pg/mL) Final       ASSESSMENT AND PLAN:

## 2012-01-05 NOTE — Patient Instructions (Signed)
Your physician has recommended you make the following change in your medication:  1) Stop Flecainide 2) Start Rhythmol (propafenone) 225 mg twice daily.  Your physician has requested that you have an exercise tolerance test in 7-10 days. For further information please visit [email or web address] Please also follow instruction sheet, as given.  Your physician wants you to follow-up in: 4 months. You will receive a reminder letter in the mail two months in advance. If you don't receive a letter, please call our office to schedule the follow-up appointment.

## 2012-01-06 ENCOUNTER — Encounter: Payer: Medicare Other | Admitting: Nurse Practitioner

## 2012-01-10 ENCOUNTER — Telehealth: Payer: Self-pay | Admitting: Internal Medicine

## 2012-01-10 NOTE — Telephone Encounter (Signed)
I spoke with the patient about 4 pm. She was reporting HR's ranging from 45-124 bpm since I spoke with her this morning. She reports BP's from 88/57-94/58. She is not symptomatic with her rhythm. I reviewed the patient's symptoms with Dr. Graciela Husbands. He recommends she increase her rhythmol to 225 mg TID. She is to call back Thursday morning. If she is still out of rhythm, she will be cardioverted. If she is back in rhythm she will come in for her GXT with Tereso Newcomer. The patient has been given instructions to call me on Monday morning. I will forward a copy of this message to triage to set up a cardioversion for her for Thursday (with the reader), since it is after 5 pm. I will cancel this on Thursday should she be in NSR then. She has been instructed to be NPO after midnight in anticipation of possible DCCV for Thursday. I will leave her procedure checklist on top of the triage cart for Wednesday. I will ask the triage nurse to forward this message back to me and let me know when this has been scheduled for. I will notify the patient of the details on Thursday morning.

## 2012-01-10 NOTE — Telephone Encounter (Signed)
Pt has been out of rhythm since 5am and she needs to take her rhythmal and pradaxa at 9am and she needs to know if she should take it

## 2012-01-10 NOTE — Telephone Encounter (Signed)
Patient called,stating her heart has been out of rhythm since 5 am this morning.States she sees Dr.Klein and wants to know what to do.Message sent to Dr.Kleins's nurse.

## 2012-01-10 NOTE — Telephone Encounter (Signed)
FU Call: Pt calling back to speak with [facility] regarding pt still being in a-fib. Please return pt call to discuss further/advise.

## 2012-01-10 NOTE — Telephone Encounter (Signed)
Pt calling back again re her a-fib, denies chest pain, SOB, dizziness, lightheaedness, syncope, but says she needs a call before having a stroke [phone], pt was offered triage call earlier but wanted the call from dr [facility] nurse

## 2012-01-10 NOTE — Telephone Encounter (Signed)
I spoke with the patient. She states that at 5 am she went out of rhythm. She has not taken any of her medications this morning. She states that her HR are ranging mainly from the 60's- low 100's. I have instructed her to take her medications and check her BP and pulse in 1 hour. I will call her back in about 2 hours and see if she is still out of rhythm and what her HR's are. I will review with Dr. Graciela Husbands at that time. She is scheduled for a GXT on 3/14 with Tereso Newcomer. This may need to be cancelled if her a-fib persists.

## 2012-01-10 NOTE — Telephone Encounter (Signed)
Fu call Patient calling back again 

## 2012-01-11 ENCOUNTER — Encounter (HOSPITAL_COMMUNITY): Payer: Self-pay | Admitting: Pharmacy Technician

## 2012-01-11 NOTE — Telephone Encounter (Signed)
Pt's cardioversion is scheduled for 01/12/12 at 12pm with Dr. Jens Som.  The checklist is placed back on [employer].

## 2012-01-12 ENCOUNTER — Encounter (HOSPITAL_COMMUNITY): Payer: Self-pay

## 2012-01-12 ENCOUNTER — Encounter: Payer: Medicare Other | Admitting: Physician Assistant

## 2012-01-12 ENCOUNTER — Encounter (HOSPITAL_COMMUNITY)
Admission: RE | Disposition: A | Discharge status: Home / Self Care | Payer: Self-pay | Source: Ambulatory Visit | Attending: Cardiology

## 2012-01-12 ENCOUNTER — Other Ambulatory Visit: Payer: Self-pay | Admitting: Internal Medicine

## 2012-01-12 ENCOUNTER — Other Ambulatory Visit: Payer: Self-pay

## 2012-01-12 ENCOUNTER — Ambulatory Visit (HOSPITAL_COMMUNITY)
Admission: RE | Admit: 2012-01-12 | Discharge: 2012-01-12 | Disposition: A | Discharge status: Home / Self Care | Payer: Medicare Other | Source: Ambulatory Visit | Attending: Cardiology | Admitting: Cardiology

## 2012-01-12 ENCOUNTER — Ambulatory Visit (HOSPITAL_COMMUNITY): Payer: Medicare Other

## 2012-01-12 DIAGNOSIS — I4891 Unspecified atrial fibrillation: Secondary | ICD-10-CM

## 2012-01-12 HISTORY — PX: CARDIOVERSION: SHX1299

## 2012-01-12 LAB — DIFFERENTIAL
Lymphocytes Relative: 19 % (ref 12–46)
Lymphs Abs: 1.8 10*3/uL (ref 0.7–4.0)
Neutrophils Relative %: 69 % (ref 43–77)

## 2012-01-12 LAB — CBC
MCV: 90.1 fL (ref 78.0–100.0)
Platelets: 253 10*3/uL (ref 150–400)
RBC: 4.64 MIL/uL (ref 3.87–5.11)
WBC: 9.1 10*3/uL (ref 4.0–10.5)

## 2012-01-12 LAB — BASIC METABOLIC PANEL
CO2: 29 mEq/L (ref 19–32)
Calcium: 9.6 mg/dL (ref 8.4–10.5)
Sodium: 141 mEq/L (ref 135–145)

## 2012-01-12 SURGERY — CARDIOVERSION
Anesthesia: Monitor Anesthesia Care | Wound class: Clean

## 2012-01-12 MED ORDER — SODIUM CHLORIDE 0.9 % IV SOLN
INTRAVENOUS | Status: DC | PRN
  Administered 2012-01-12: 12:00:00 via INTRAVENOUS

## 2012-01-12 MED ORDER — SODIUM CHLORIDE 0.9 % IV SOLN: INTRAVENOUS | Status: DC

## 2012-01-12 MED ORDER — HYDROCORTISONE 1 % EX CREA: 1.0000 "application " | TOPICAL_CREAM | Freq: Three times a day (TID) | CUTANEOUS | Status: DC | PRN

## 2012-01-12 MED ORDER — PROPOFOL 10 MG/ML IV EMUL
INTRAVENOUS | Status: DC | PRN
  Administered 2012-01-12: 100 mg via INTRAVENOUS

## 2012-01-12 NOTE — Anesthesia Postprocedure Evaluation (Signed)
  Anesthesia Post-op Note  Patient: Lorraine Little  Procedure(s) Performed: Procedure(s) (LRB): CARDIOVERSION (N/A)  Patient Location: PACU  Anesthesia Type: MAC  Level of Consciousness: awake, alert  and oriented  Airway and Oxygen Therapy: Patient Spontanous Breathing and Patient connected to nasal cannula oxygen  Post-op Pain: none  Post-op Assessment: Post-op Vital signs reviewed  Post-op Vital Signs: Reviewed and stable  Complications: No apparent anesthesia complications

## 2012-01-12 NOTE — Procedures (Signed)
Electrical Cardioversion Procedure Note Lorraine Little [id number] Mar 28, 1941  Procedure: Electrical Cardioversion Indications:  Atrial Fibrillation  Procedure Details Consent: Risks of procedure as well as the alternatives and risks of each were explained to the (patient/caregiver).  Consent for procedure obtained. Time Out: Verified patient identification, verified procedure, site/side was marked, verified correct patient position, special equipment/implants available, medications/allergies/relevent history reviewed, required imaging and test results available.  Performed  Patient placed on cardiac monitor, pulse oximetry, supplemental oxygen as necessary.  Sedation given: Diprovan 100 mg IV administered by anesthesia. Pacer pads placed anterior and posterior chest.  Cardioverted 1 time(s).  Cardioverted at 120J.  Evaluation Findings: Post procedure EKG shows: NSR Complications: None Patient did tolerate procedure well.   Lorraine Little 01/12/2012, 12:01 PM

## 2012-01-12 NOTE — Telephone Encounter (Signed)
In reviewing the patient's chart, she was cardioverted. Will cancel her GXT for today with Tereso Newcomer, PA.

## 2012-01-12 NOTE — Discharge Instructions (Signed)
PATIENT INSTRUCTIONS  POST-ANESTHESIA    IMMEDIATELY FOLLOWING SURGERY:  Do not drive or operate machinery for the first twenty four hours after surgery.  Do not make any important decisions for twenty four hours after surgery or while taking narcotic pain medications or sedatives.  If you develop intractable nausea and vomiting or a severe headache please notify your doctor immediately.    FOLLOW-UP:  Please make an appointment with your surgeon as instructed. You do not need to follow up with anesthesia unless specifically instructed to do so    QUESTIONS?:  Please feel free to call your physician or the hospital operator if you have any questions, and they will be happy to assist you.       [facility] Anesthesia Department  [address]  [place]  [phone]

## 2012-01-12 NOTE — Transfer of Care (Signed)
Immediate Anesthesia Transfer of Care Note  Patient: Lorraine Little  Procedure(s) Performed: Procedure(s) (LRB): CARDIOVERSION (N/A)  Patient Location: PACU  Anesthesia Type: General  Level of Consciousness: awake, alert  and oriented  Airway & Oxygen Therapy: Patient Spontanous Breathing and Patient connected to nasal cannula oxygen  Post-op Assessment: Report given to PACU RN, Post -op Vital signs reviewed and stable and Patient moving all extremities X 4  Post vital signs: Reviewed and stable  Complications: No apparent anesthesia complications

## 2012-01-12 NOTE — Preoperative (Signed)
Beta Blockers   Reason not to administer Beta Blockers:Not Applicable 

## 2012-01-12 NOTE — Anesthesia Preprocedure Evaluation (Addendum)
Anesthesia Evaluation    Airway Mallampati: II TM Distance: <3 FB Neck ROM: full    Dental  (+) Dental Advidsory Given, Caps and Teeth Intact   Pulmonary          Cardiovascular hypertension, On Home Beta Blockers + dysrhythmias Atrial Fibrillation     Neuro/Psych  Headaches,  Neuromuscular disease    GI/Hepatic GERD-  Medicated,  Endo/Other    Renal/GU      Musculoskeletal   Abdominal   Peds  Hematology   Anesthesia Other Findings Polymyalgia rheumatica Adrenal insufficiency  Reproductive/Obstetrics                          Anesthesia Physical Anesthesia Plan  ASA: III  Anesthesia Plan: MAC   Post-op Pain Management:    Induction:   Airway Management Planned:   Additional Equipment:   Intra-op Plan:   Post-operative Plan:   Informed Consent:   Dental Advisory Given  Plan Discussed with: CRNA and Anesthesiologist  Anesthesia Plan Comments:        Anesthesia Quick Evaluation

## 2012-01-12 NOTE — Telephone Encounter (Signed)
Late entry: I spoke with the patient this morning about 8:15 am. She reports that her HR's are still between 31' to 120's. She has tried to palpate her pulse and she states she feels like it will beat and skip. I have instructed her to remain NPO. She will take her meds. She will report to Short Stay at 10:00 am for a cardioversion at 12:00 pm with Dr. Jens Som.

## 2012-01-17 ENCOUNTER — Encounter (HOSPITAL_COMMUNITY): Payer: Self-pay | Admitting: Cardiology

## 2012-01-17 ENCOUNTER — Other Ambulatory Visit: Payer: Self-pay | Admitting: *Deleted

## 2012-01-17 DIAGNOSIS — I4891 Unspecified atrial fibrillation: Secondary | ICD-10-CM

## 2012-01-19 ENCOUNTER — Other Ambulatory Visit: Payer: Self-pay | Admitting: Internal Medicine

## 2012-01-25 ENCOUNTER — Encounter: Payer: Medicare Other | Admitting: Physician Assistant

## 2012-01-25 ENCOUNTER — Ambulatory Visit (INDEPENDENT_AMBULATORY_CARE_PROVIDER_SITE_OTHER): Payer: Medicare Other | Admitting: Physician Assistant

## 2012-01-25 ENCOUNTER — Ambulatory Visit (HOSPITAL_COMMUNITY): Payer: Medicare Other | Attending: Cardiology

## 2012-01-25 DIAGNOSIS — I4891 Unspecified atrial fibrillation: Secondary | ICD-10-CM

## 2012-01-25 NOTE — Procedures (Signed)
Exercise Treadmill Test  Pre-Exercise Testing Evaluation Rhythm: normal sinus  Rate: 63   PR:  .23 QRS:  .08  QT:  .40 QTc: .41           Test  Exercise Tolerance Test Ordering MD: Sherryl Manges, MD  Interpreting MD:  Tereso Newcomer, [facility]  Unique Test No: 1  Treadmill:  1  Indication for ETT: Atrial Fibrillation with new RX (Rythmol)  Contraindication to ETT: No   Stress Modality: exercise - treadmill  Cardiac Imaging Performed: non   Protocol: standard Bruce - maximal  Max BP:  159/72  Max MPHR (bpm):  148 85% MPR (bpm):  126  MPHR obtained (bpm):  104 % MPHR obtained:  70%  Reached 85% MPHR (min:sec):  n/a Total Exercise Time (min-sec):  6:35  Workload in METS:  7.9 Borg Scale: 15  Reason ETT Terminated:  patient's desire to stop    ST Segment Analysis At Rest: normal ST segments - no evidence of significant ST depression With Exercise: non-specific ST changes  Other Information Arrhythmia:  No Angina during ETT:  absent (0) Quality of ETT:  non-diagnostic  ETT Interpretation:  borderline (indeterminate) with non-specific ST changes  Comments: Poor exercise tolerance. No chest pain. She did have significant dyspnea and needed to stop b/c of this. Normal BP response to exercise. 0.5-1 mm of ST depression in inf-lat leads at peak exercise and early recovery. Reviewed prior ETT-myoview in 9/12 and changes were similar. No exercise induced ventricular arrhythmias.   Recommendations: Follow up with Dr. Sherryl Manges as directed. Tereso Newcomer, PA-C  9:10 AM 01/25/2012

## 2012-01-26 ENCOUNTER — Telehealth: Payer: Self-pay | Admitting: *Deleted

## 2012-01-26 DIAGNOSIS — I4891 Unspecified atrial fibrillation: Secondary | ICD-10-CM

## 2012-01-26 MED ORDER — PROPAFENONE HCL ER 225 MG PO CP12: 225.0000 mg | ORAL_CAPSULE | Freq: Three times a day (TID) | ORAL | Status: DC

## 2012-01-26 NOTE — Telephone Encounter (Signed)
The patient called needing her rythmol prescription refilled for one tablet TID to [facility] on [address].

## 2012-02-04 ENCOUNTER — Other Ambulatory Visit: Payer: Self-pay | Admitting: Nurse Practitioner

## 2012-02-10 ENCOUNTER — Encounter: Payer: Self-pay | Admitting: Gastroenterology

## 2012-02-10 ENCOUNTER — Other Ambulatory Visit: Payer: Self-pay

## 2012-02-10 ENCOUNTER — Other Ambulatory Visit: Payer: Self-pay | Admitting: Nurse Practitioner

## 2012-02-10 MED ORDER — POTASSIUM CHLORIDE CRYS ER 10 MEQ PO TBCR: 10.0000 meq | EXTENDED_RELEASE_TABLET | Freq: Every day | ORAL | Status: DC

## 2012-02-17 ENCOUNTER — Other Ambulatory Visit: Payer: Self-pay | Admitting: Internal Medicine

## 2012-03-05 ENCOUNTER — Telehealth: Payer: Self-pay | Admitting: *Deleted

## 2012-03-05 NOTE — Telephone Encounter (Signed)
Received call from pt. She states she received note from Dr. Ardell Isaacs office indicating she is due for a colonoscopy and to call to schedule appt.  Pt wants to check with Dr.Klein to see if OK with him to have colonoscopy.  She is aware Dr. Graciela Husbands is out of office this week

## 2012-03-12 NOTE — Telephone Encounter (Signed)
If she requires a colonoscopy, then she can have it  Given her history of stroke we will have to be very careful about her anticoagulation at the time of the procedure.  We can help her coordinate that

## 2012-03-13 NOTE — Telephone Encounter (Signed)
The patient is aware of Dr. Odessa Fleming recommendations. She will discuss this with Dr. Russella Dar.

## 2012-03-20 ENCOUNTER — Other Ambulatory Visit: Payer: Self-pay | Admitting: Internal Medicine

## 2012-03-22 ENCOUNTER — Encounter: Payer: Self-pay | Admitting: Gastroenterology

## 2012-03-27 ENCOUNTER — Telehealth: Payer: Self-pay | Admitting: *Deleted

## 2012-03-27 NOTE — Telephone Encounter (Signed)
The patient had called and left a message on my voice mail that she was in a-fib yesterday and today. I called and spoke with her this afternoon. She states she was in a-fib yesterday when she woke up. She did notice that her heart was pounding. Her BP and pulse last night was 96/53 and HR- 78. She still feels like she is out of rhythm today. I have discussed this with Dr. Graciela Husbands. He would like the patient to come in tomorrow for an EKG. He would like her to be NPO. If she is still in a-fib she will need to be set up for a same day DCCV. If anesthesia is not available, this can potentially be arranged with Dr. Graciela Husbands in the EP lab. I will forward to triage.

## 2012-03-28 ENCOUNTER — Ambulatory Visit (INDEPENDENT_AMBULATORY_CARE_PROVIDER_SITE_OTHER): Payer: Medicare Other | Admitting: *Deleted

## 2012-03-28 ENCOUNTER — Encounter: Payer: Self-pay | Admitting: *Deleted

## 2012-03-28 ENCOUNTER — Encounter (HOSPITAL_COMMUNITY): Payer: Self-pay | Admitting: Nurse Practitioner

## 2012-03-28 ENCOUNTER — Encounter (HOSPITAL_COMMUNITY)
Admission: RE | Disposition: A | Discharge status: Home / Self Care | Payer: Self-pay | Source: Ambulatory Visit | Attending: Cardiology

## 2012-03-28 ENCOUNTER — Ambulatory Visit (HOSPITAL_COMMUNITY): Payer: Medicare Other | Admitting: Anesthesiology

## 2012-03-28 ENCOUNTER — Encounter (HOSPITAL_COMMUNITY): Payer: Self-pay | Admitting: Anesthesiology

## 2012-03-28 ENCOUNTER — Ambulatory Visit (HOSPITAL_COMMUNITY)
Admission: RE | Admit: 2012-03-28 | Discharge: 2012-03-28 | Disposition: A | Discharge status: Home / Self Care | Payer: Medicare Other | Source: Ambulatory Visit | Attending: Cardiology | Admitting: Cardiology

## 2012-03-28 VITALS — BP 124/62 | HR 87 | Resp 16 | Ht 63.0 in | Wt 124.0 lb

## 2012-03-28 DIAGNOSIS — M353 Polymyalgia rheumatica: Secondary | ICD-10-CM | POA: Insufficient documentation

## 2012-03-28 DIAGNOSIS — I4891 Unspecified atrial fibrillation: Secondary | ICD-10-CM

## 2012-03-28 DIAGNOSIS — K219 Gastro-esophageal reflux disease without esophagitis: Secondary | ICD-10-CM | POA: Insufficient documentation

## 2012-03-28 DIAGNOSIS — IMO0002 Reserved for concepts with insufficient information to code with codable children: Secondary | ICD-10-CM | POA: Insufficient documentation

## 2012-03-28 DIAGNOSIS — I10 Essential (primary) hypertension: Secondary | ICD-10-CM | POA: Insufficient documentation

## 2012-03-28 DIAGNOSIS — Z8673 Personal history of transient ischemic attack (TIA), and cerebral infarction without residual deficits: Secondary | ICD-10-CM | POA: Insufficient documentation

## 2012-03-28 DIAGNOSIS — Z7901 Long term (current) use of anticoagulants: Secondary | ICD-10-CM | POA: Insufficient documentation

## 2012-03-28 HISTORY — PX: CARDIOVERSION: SHX1299

## 2012-03-28 HISTORY — DX: Cerebral infarction, unspecified: I63.9

## 2012-03-28 LAB — CBC
HCT: 41.7 % (ref 36.0–46.0)
Hemoglobin: 14.1 g/dL (ref 12.0–15.0)
MCV: 90.5 fL (ref 78.0–100.0)
WBC: 8.1 10*3/uL (ref 4.0–10.5)

## 2012-03-28 LAB — BASIC METABOLIC PANEL
BUN: 12 mg/dL (ref 6–23)
Chloride: 99 mEq/L (ref 96–112)
Creatinine, Ser: 1.07 mg/dL (ref 0.50–1.10)
Glucose, Bld: 105 mg/dL — ABNORMAL HIGH (ref 70–99)
Potassium: 3.5 mEq/L (ref 3.5–5.1)

## 2012-03-28 SURGERY — CARDIOVERSION
Anesthesia: General | Wound class: Clean

## 2012-03-28 MED ORDER — LIDOCAINE HCL (CARDIAC) 20 MG/ML IV SOLN
INTRAVENOUS | Status: DC | PRN
  Administered 2012-03-28: 50 mg via INTRAVENOUS

## 2012-03-28 MED ORDER — PROPOFOL 10 MG/ML IV BOLUS
INTRAVENOUS | Status: DC | PRN
  Administered 2012-03-28: 70 mg via INTRAVENOUS

## 2012-03-28 MED ORDER — HYDROCORTISONE 1 % EX CREA
1.0000 "application " | TOPICAL_CREAM | Freq: Three times a day (TID) | CUTANEOUS | Status: DC | PRN
  Filled 2012-03-28: qty 28

## 2012-03-28 MED ORDER — SODIUM CHLORIDE 0.9 % IV SOLN: INTRAVENOUS | Status: DC

## 2012-03-28 NOTE — H&P (Signed)
Agree with PA note.  Has been on Pradaxa > 1 month.  Plan DCCV today.

## 2012-03-28 NOTE — Telephone Encounter (Signed)
Today's EKG confirmed A-fib.  Pt scheduled for DCCV today at [facility] with Dr. Shirlee Latch.  Pt notified.

## 2012-03-28 NOTE — Anesthesia Postprocedure Evaluation (Signed)
  Anesthesia Post-op Note  Patient: Lorraine Little  Procedure(s) Performed: Procedure(s) (LRB): CARDIOVERSION (N/A)  Patient Location: Short Stay  Anesthesia Type: General  Level of Consciousness: awake, alert  and oriented  Airway and Oxygen Therapy: Patient Spontanous Breathing  Post-op Pain: none  Post-op Assessment: Post-op Vital signs reviewed and Patient's Cardiovascular Status Stable  Post-op Vital Signs: Reviewed and stable  Complications: No apparent anesthesia complications

## 2012-03-28 NOTE — Progress Notes (Signed)
Pt is npo/ ekg done/ afib per Dr Allred/ pt to be set for cardioversion today. Pt on pradaxa. Cardioversion set for today with Dr Shirlee Latch, see letter. Pt agreed to plan.

## 2012-03-28 NOTE — Preoperative (Signed)
Beta Blockers   Reason not to administer Beta Blockers:Not Applicable 

## 2012-03-28 NOTE — Transfer of Care (Signed)
Immediate Anesthesia Transfer of Care Note  Patient: Lorraine Little  Procedure(s) Performed: Procedure(s) (LRB): CARDIOVERSION (N/A)  Patient Location: Short Stay  Anesthesia Type: General  Level of Consciousness: awake, alert  and oriented  Airway & Oxygen Therapy: Patient Spontanous Breathing and Patient connected to nasal cannula oxygen  Post-op Assessment: Report given to PACU RN and Post -op Vital signs reviewed and stable  Post vital signs: Reviewed and stable  Complications: No apparent anesthesia complications

## 2012-03-28 NOTE — Anesthesia Postprocedure Evaluation (Signed)
  Anesthesia Post-op Note  Patient: Lorraine Little  Procedure(s) Performed: Procedure(s) (LRB): CARDIOVERSION (N/A)  Patient Location: PACU and Short Stay  Anesthesia Type: General  Level of Consciousness: awake and alert   Airway and Oxygen Therapy: Patient Spontanous Breathing  Post-op Pain: none  Post-op Assessment: Post-op Vital signs reviewed  Post-op Vital Signs: Reviewed  Complications: No apparent anesthesia complications

## 2012-03-28 NOTE — H&P (Signed)
Patient ID: Lorraine Little MRN: [id number], DOB/AGE: 73-Aug-1942   Admit date: 03/28/2012   Primary Physician: Ezequiel Kayser, MD, MD Primary Cardiologist: Odessa Fleming, MD  Pt. Profile:  71 y/o female w/ h/o PAF who presents to [facility] for elective cardioversion.  Problem List  Past Medical History  Diagnosis Date  . Polymyalgia rheumatica     a. On chronic steroids  . Hypertension   . GERD (gastroesophageal reflux disease)   . Migraine headache   . Cervical disc disease   . Osteoporosis   . Glaucoma   . Osteoarthritis   . PAF (paroxysmal atrial fibrillation)     a.  Diagnosed 07/2011;  b. Normal nuclear myoview 07/2011 and normal EF at that time;  c. Most recent echo 2/20 /13 - EF 55-60% wit mild-mod MR.;  d. s/p DCCV 01/12/2012  . Hyperlipidemia     a. Diagnosed 12/2011  . CVA (cerebral infarction)     a. 11/2011    Past Surgical History  Procedure Date  . Breast lumpectomy 1980's    right  x2  . Tonsillectomy age 89  . Cardioversion 01/12/2012    Procedure: CARDIOVERSION;  Surgeon: Lewayne Bunting, MD;  Location: [facility] OR;  Service: Cardiovascular;  Laterality: N/A;     Allergies  No Known Allergies  HPI  71 y/o female with the above problem list.  She was Dx with afib in 07/2011.  Initially this was treated with Diltiazem and pt converted to sinus.  She was on pradaxa for a short period but this was d/c'd in the fall of 2012.  Unfortunately, she suffered a CVA in Feb of this year and was placed back on Pradaxa & also initiated on Flecainide.  In March of this year, she had recurrent afib and flecainide was d/c'd in favor of rhythmol and she underwent successful DCCV.  She had been doing well until this past Monday morning when she awoke with palpitations consistent with afib.  She called the office and presented yesterday for an ECG, which confirmed afib.  She presents today for DCCV.  Home Medications  Prior to Admission medications   Medication Sig Start Date End Date  Taking? Authorizing Provider  alendronate (FOSAMAX) 70 MG tablet Take 70 mg by mouth every 7 (seven) days. Take with a full glass of water on an empty stomach. Take on Wednesday   Yes Historical Provider, MD  atenolol (TENORMIN) 50 MG tablet Take 50 mg by mouth daily.    Yes Historical Provider, MD  atorvastatin (LIPITOR) 20 MG tablet Take 20 mg by mouth every Monday, Wednesday, and Friday.    Yes Historical Provider, MD  Calcium Carbonate-Vitamin D (CALCIUM + D) 600-200 MG-UNIT TABS Take 1 tablet by mouth daily.    Yes Historical Provider, MD  Coenzyme Q10 (CO Q 10 PO) Take 1 tablet by mouth daily.   Yes Historical Provider, MD  dabigatran (PRADAXA) 150 MG CAPS Take 150 mg by mouth every 12 (twelve) hours.   Yes Historical Provider, MD  diltiazem (DILACOR XR) 120 MG 24 hr capsule Take 120 mg by mouth daily.   Yes Historical Provider, MD  fish oil-omega-3 fatty acids 1000 MG capsule Take 1 g by mouth 2 (two) times daily.    Yes Historical Provider, MD  gabapentin (NEURONTIN) 100 MG capsule Take 100-200 mg by mouth 2 (two) times daily. Take 100mg  at noon daily and take 200mg  every night at bedtime   Yes Historical Provider, MD  pantoprazole (PROTONIX) 40  MG tablet Take 40 mg by mouth daily.    Yes Historical Provider, MD  potassium chloride (K-DUR,KLOR-CON) 10 MEQ tablet Take 1 tablet (10 mEq total) by mouth daily. 02/10/12  Yes Ok Anis, NP  predniSONE (DELTASONE) 5 MG tablet Take 5 mg by mouth daily.    Yes Historical Provider, MD  propafenone (RYTHMOL SR) 225 MG 12 hr capsule Take 1 capsule (225 mg total) by mouth 3 (three) times daily. 01/26/12 01/25/13 Yes Duke Salvia, MD  Sennosides (SENNA LAX PO) Take 1 tablet by mouth at bedtime as needed. For constipation   Yes Historical Provider, MD  temazepam (RESTORIL) 15 MG capsule Take 15 mg by mouth at bedtime.    Yes Historical Provider, MD  triamterene-hydrochlorothiazide (MAXZIDE-25) 37.5-25 MG per tablet Take 0.5 tablets by mouth daily.    Yes Historical Provider, MD  Vitamin D, Ergocalciferol, (DRISDOL) 50000 UNITS CAPS Take 50,000 Units by mouth every 14 (fourteen) days.   Yes Historical Provider, MD    Family History  Family History  Problem Relation Age of Onset  . Stroke Mother     Deceased @ 52  . Cancer Father     Deceased @ 73    Social History  History   Social History  . Marital Status: Single    Spouse Name: N/A    Number of Children: 3  . Years of Education: N/A   Occupational History  . homemaker    Social History Main Topics  . Smoking status: Never Smoker   . Smokeless tobacco: Not on file  . Alcohol Use: Yes     SOCIAL  . Drug Use: No  . Sexually Active: Not on file   Other Topics Concern  . Not on file   Social History Narrative  . No narrative on file     Review of Systems General:  No chills, fever, night sweats or weight changes.  Cardiovascular:  +++tachypalpitations.  No chest pain, dyspnea on exertion, edema, orthopnea, paroxysmal nocturnal dyspnea. Dermatological: No rash, lesions/masses Respiratory: No cough, dyspnea Urologic: No hematuria, dysuria Abdominal:   No nausea, vomiting, diarrhea, bright red blood per rectum, melena, or hematemesis Neurologic:  No visual changes, wkns, changes in mental status. All other systems reviewed and are otherwise negative except as noted above.  Physical Exam  Blood pressure 118/75, pulse 99, temperature 97.2 F (36.2 C), temperature source Oral, resp. rate 22, height 5\' 3"  (1.6 m), weight 124 lb (56.246 kg), SpO2 99.00%.  General: Pleasant, NAD Psych: Normal affect. Neuro: Alert and oriented X 3. Moves all extremities spontaneously. HEENT: Normal  Neck: Supple without bruits or JVD. Lungs:  Resp regular and unlabored, CTA. Heart: Ir, Ir no s3, s4, or murmurs. Abdomen: Soft, non-tender, non-distended, BS + x 4.  Extremities: No clubbing, cyanosis or edema. DP/PT/Radials 2+ and equal bilaterally.  Labs  Lab Results    Component Value Date   WBC 8.1 03/28/2012   HGB 14.1 03/28/2012   HCT 41.7 03/28/2012   MCV 90.5 03/28/2012   PLT 273 03/28/2012    Lab 03/28/12 1203  NA 140  K 3.5  CL 99  CO2 32  BUN 12  CREATININE 1.07  CALCIUM 9.8  PROT --  BILITOT --  ALKPHOS --  ALT --  AST --  GLUCOSE 105*    Radiology/Studies  No results found.  ECG  Af, 98, LAD, inflat st slurring/depression - similar to ecg when in afib 3/14.  ASSESSMENT AND PLAN  1. PAF:  Pt with recurrent afib over past 48 hrs.  She has not missed any doses of either rhythmol or pradaxa.  She presents today for elective DCCV.  Cont home meds.  Signed, Nicolasa Ducking, NP 03/28/2012, 1:46 PM

## 2012-03-28 NOTE — Anesthesia Preprocedure Evaluation (Addendum)
Anesthesia Evaluation  Patient identified by MRN, date of birth, ID band Patient awake    Reviewed: Allergy & Precautions, H&P , NPO status , Patient's Chart, lab work & pertinent test results, reviewed documented beta blocker date and time   History of Anesthesia Complications Negative for: history of anesthetic complications  Airway Mallampati: II TM Distance: >3 FB Neck ROM: Limited    Dental  (+) Teeth Intact and Dental Advisory Given   Pulmonary neg pulmonary ROS,  breath sounds clear to auscultation        Cardiovascular Rhythm:Irregular Rate:Normal     Neuro/Psych  Headaches,  Neuromuscular disease CVA, No Residual Symptoms negative psych ROS   GI/Hepatic Neg liver ROS, GERD-  Medicated and Controlled,  Endo/Other  negative endocrine ROS  Renal/GU negative Renal ROS  negative genitourinary   Musculoskeletal   Abdominal   Peds negative pediatric ROS (+)  Hematology negative hematology ROS (+)   Anesthesia Other Findings   Reproductive/Obstetrics negative OB ROS                         Anesthesia Physical Anesthesia Plan  ASA: III  Anesthesia Plan: General   Post-op Pain Management:    Induction: Intravenous  Airway Management Planned: Mask  Additional Equipment:   Intra-op Plan:   Post-operative Plan:   Informed Consent: I have reviewed the patients History and Physical, chart, labs and discussed the procedure including the risks, benefits and alternatives for the proposed anesthesia with the patient or authorized representative who has indicated his/her understanding and acceptance.   Dental advisory given  Plan Discussed with: CRNA, Surgeon and Anesthesiologist  Anesthesia Plan Comments:         Anesthesia Quick Evaluation

## 2012-03-28 NOTE — Procedures (Signed)
Electrical Cardioversion Procedure Note Lorraine Little [id number] [birth year]  Procedure: Electrical Cardioversion Indications:  Atrial Fibrillation.  She has been on Pradaxa without missing a dose for > 1 month.   Procedure Details Consent: Risks of procedure as well as the alternatives and risks of each were explained to the (patient/caregiver).  Consent for procedure obtained. Time Out: Verified patient identification, verified procedure, site/side was marked, verified correct patient position, special equipment/implants available, medications/allergies/relevent history reviewed, required imaging and test results available.  Performed  Patient placed on cardiac monitor, pulse oximetry, supplemental oxygen as necessary.  Sedation given: IV Propofol 70 mg.  Pacer pads placed anterior and posterior chest.  Cardioverted 1 time(s).  Cardioverted at 200J.  Evaluation Findings: Post procedure EKG shows: NSR Complications: None Patient did tolerate procedure well.   Marca Ancona 03/28/2012, 2:24 PM

## 2012-03-28 NOTE — Discharge Instructions (Signed)
General Anesthetic, Adult A doctor specialized in giving anesthesia (anesthesiologist) or a nurse specialized in giving anesthesia (nurse anesthetist) gives medicine that makes you sleep while a procedure is performed (general anesthetic). Once the general anesthetic has been administered, you will be in a sleeplike state in which you feel no pain. After having a general anestheticyou may feel:   Dizzy.   Weak.   Drowsy.   Confused.  These feelings are normal and can be expected to last for up to 24 hours after the procedure is completed.  LET YOUR CAREGIVER KNOW ABOUT:  Allergies you have.   Medications you are taking, including herbs, eye drops, over the counter medications, dietary supplements, and creams.   Previous problems you have had with anesthetics or numbing medicines.   Use of cigarettes, alcohol, or illicit drugs.   Possibility of pregnancy, if this applies.   History of bleeding or blood disorders, including blood clots and clotting disorders.   Previous surgeries you have had and types of anesthetics you have received.   Family medical history, especially anesthetic problems.   Other health problems.  BEFORE THE PROCEDURE  You may brush your teeth on the morning of surgery but you should have no solid food or non-clear liquids for a minimum of 8 hours prior to your procedure. Clear liquids (water, black coffee, and tea) are acceptable in small amounts until 2 hours prior to your procedure.   You may take your regular medications the morning of your procedure unless your caregiver indicates otherwise.  AFTER THE PROCEDURE  After surgery, you will be taken to the recovery area where a nurse will monitor your progress. You will be allowed to go home when you are awake, stable, taking fluids well, and without serious pain or complications.   For the first 24 hours following an anesthetic:   Have a responsible person with you.   Do not drive a car. If you are  alone, do not take public transportation.   Do not engage in strenuous activity. You may usually resume normal activities the next day, or as advised by your caregiver.   Do not drink alcohol.   Do not take medicine that has not been prescribed by your caregiver.   Do not sign important papers or make important decisions as your judgement may be impaired.   You may resume a normal diet as directed.   Change bandages (dressings) as directed.   Only take over-the-counter or prescription medicines for pain, discomfort, or fever as directed by your caregiver.  If you have questions or problems that seem related to the anesthetic, call the hospital and ask for the anesthetist, anesthesiologist, or anesthesia department. SEEK IMMEDIATE MEDICAL CARE IF:   You develop a rash.   You have difficulty breathing.   You have chest pain.   You have allergic problems.   You have uncontrolled nausea.   You have uncontrolled vomiting.   You develop any serious bleeding, especially from the incision site.  Document Released: 01/24/2008 Document Revised: 10/06/2011 Document Reviewed: 02/17/2011 [employer] Patient Information 2012 [employer]. 

## 2012-03-29 ENCOUNTER — Encounter (HOSPITAL_COMMUNITY): Payer: Self-pay | Admitting: Cardiology

## 2012-04-16 ENCOUNTER — Encounter: Payer: Self-pay | Admitting: Internal Medicine

## 2012-04-16 ENCOUNTER — Ambulatory Visit (INDEPENDENT_AMBULATORY_CARE_PROVIDER_SITE_OTHER): Payer: Medicare Other | Admitting: Internal Medicine

## 2012-04-16 VITALS — BP 110/60 | HR 72 | Ht 63.0 in | Wt 129.0 lb

## 2012-04-16 DIAGNOSIS — R609 Edema, unspecified: Secondary | ICD-10-CM | POA: Insufficient documentation

## 2012-04-16 DIAGNOSIS — I4891 Unspecified atrial fibrillation: Secondary | ICD-10-CM

## 2012-04-16 DIAGNOSIS — I480 Paroxysmal atrial fibrillation: Secondary | ICD-10-CM

## 2012-04-16 NOTE — Progress Notes (Signed)
HPI  Lorraine Little is a 71 y.o. female Seen in followup for atrial fibrillation. She underwent cardioversion may 2013 while on Rythmol  She has a history of CVA. She is on Pradaxa  Cardiac evaluation has included a normal Myoview echo with left atrial dimension of 33  She has had one intercurrent episode of atrial fibrillation;  it lasted just a few hours.  Also, she has noted quite embarrassing flatulence. Review suggested this may be related to her pradaxa  She notes unilateral swelling of her left leg new in the last few months.  Past Medical History  Diagnosis Date  . Polymyalgia rheumatica     a. On chronic steroids  . Hypertension   . GERD (gastroesophageal reflux disease)   . Migraine headache   . Cervical disc disease   . Osteoporosis   . Glaucoma   . Osteoarthritis   . PAF (paroxysmal atrial fibrillation)     a.  Diagnosed 07/2011;  b. Normal nuclear myoview 07/2011 and normal EF at that time;  c. Most recent echo 2/20 /13 - EF 55-60% wit mild-mod MR.;  d. s/p DCCV 01/12/2012  . Hyperlipidemia     a. Diagnosed 12/2011  . CVA (cerebral infarction)     a. 11/2011    Past Surgical History  Procedure Date  . Breast lumpectomy 1980's    right  x2  . Tonsillectomy age 30  . Cardioversion 01/12/2012    Procedure: CARDIOVERSION;  Surgeon: Lewayne Bunting, MD;  Location: [facility] OR;  Service: Cardiovascular;  Laterality: N/A;  . Cardioversion 03/28/2012    Procedure: CARDIOVERSION;  Surgeon: Laurey Morale, MD;  Location: [facility] OR;  Service: Cardiovascular;  Laterality: N/A;    Current Outpatient Prescriptions  Medication Sig Dispense Refill  . alendronate (FOSAMAX) 70 MG tablet Take 70 mg by mouth every 7 (seven) days. Take with a full glass of water on an empty stomach. Take on Wednesday      . atenolol (TENORMIN) 50 MG tablet Take 50 mg by mouth daily.       Marland Kitchen atorvastatin (LIPITOR) 20 MG tablet Take 20 mg by mouth every Monday, Wednesday, and Friday.       . Calcium  Carbonate-Vitamin D (CALCIUM + D) 600-200 MG-UNIT TABS Take 1 tablet by mouth daily.       . Coenzyme Q10 (CO Q 10 PO) Take 1 tablet by mouth daily.      . dabigatran (PRADAXA) 150 MG CAPS Take 150 mg by mouth every 12 (twelve) hours.      Marland Kitchen diltiazem (DILACOR XR) 120 MG 24 hr capsule Take 120 mg by mouth daily.      . fish oil-omega-3 fatty acids 1000 MG capsule Take 1 g by mouth 2 (two) times daily.       Marland Kitchen gabapentin (NEURONTIN) 100 MG capsule Take 100-200 mg by mouth 2 (two) times daily. Take 100mg  at noon daily and take 200mg  every night at bedtime      . pantoprazole (PROTONIX) 40 MG tablet Take 40 mg by mouth daily.       . potassium chloride (K-DUR,KLOR-CON) 10 MEQ tablet Take 1 tablet (10 mEq total) by mouth daily.  30 tablet  2  . predniSONE (DELTASONE) 5 MG tablet Take 5 mg by mouth daily.       . propafenone (RYTHMOL SR) 225 MG 12 hr capsule Take 1 capsule (225 mg total) by mouth 3 (three) times daily.  90 capsule  6  .  Sennosides (SENNA LAX PO) Take 1 tablet by mouth at bedtime as needed. For constipation      . temazepam (RESTORIL) 15 MG capsule Take 15 mg by mouth at bedtime.       . triamterene-hydrochlorothiazide (MAXZIDE-25) 37.5-25 MG per tablet Take 0.5 tablets by mouth daily.      . Vitamin D, Ergocalciferol, (DRISDOL) 50000 UNITS CAPS Take 50,000 Units by mouth every 14 (fourteen) days.      Marland Kitchen DISCONTD: flecainide (TAMBOCOR) 100 MG tablet Take 100mg  1 tablet prn onset of atrial fib.  30 tablet  6    No Known Allergies  Review of Systems negative except from HPI and PMH  Physical Exam BP 110/60  Pulse 72  Ht 5\' 3"  (1.6 m)  Wt 129 lb (58.514 kg)  BMI 22.85 kg/m2 Well developed and well nourished in no acute distress HENT normal E scleral and icterus clear Neck Supple JVP flat; carotids brisk and full Clear to ausculation *Regular rate and rhythm, no murmurs gallops or rub Soft with active bowel sounds No clubbing cyanosis mild left versus minimal right  Edema Alert and oriented, grossly normal motor and sensory function Skin Warm and Dry  Sinus rhythm at 72 Intervals 23/09/38 Axis LXXVII  Assessment and  Plan

## 2012-04-16 NOTE — Patient Instructions (Addendum)
Your physician has recommended you make the following change in your medication:  1) Stop pradaxa 2) Start xarelto 20 mg once daily x 2 weeks to see how you tolerate this.  Your physician has requested that you have a lower extremity venous duplex. This test is an ultrasound of the veins in the legs. It looks at venous blood flow that carries blood from the heart to the legs. Allow one hour for a Lower Venous exam.  There are no restrictions or special instructions.  Your physician wants you to follow-up in: 6 months with Dr. Graciela Husbands. You will receive a reminder letter in the mail two months in advance. If you don't receive a letter, please call our office to schedule the follow-up appointment.

## 2012-04-16 NOTE — Assessment & Plan Note (Signed)
Patient continues to albeit infrequent episodes of atrial fibrillation. We will continue her on her current medications.  As relates to the flatulence, 2% in patients taking diabigitarn  can have this complaint. Give her a 2 week trial of Rivaroxaban and see if the symptoms don't resolve.  In addition, she is to see Dr. Russella Dar in GI in the next couple of weeks concerning repeat colonoscopy. This can probably be done safely with the new oral anticoagulants which can be stopped very briefly for procedure.

## 2012-04-16 NOTE — Assessment & Plan Note (Signed)
Unilateral. Will obtain left sided venous Dopplers

## 2012-04-24 ENCOUNTER — Encounter: Payer: Self-pay | Admitting: Gastroenterology

## 2012-04-24 ENCOUNTER — Ambulatory Visit (INDEPENDENT_AMBULATORY_CARE_PROVIDER_SITE_OTHER): Payer: Medicare Other | Admitting: Gastroenterology

## 2012-04-24 VITALS — BP 112/62 | HR 66 | Ht 63.0 in | Wt 128.8 lb

## 2012-04-24 DIAGNOSIS — Z86010 Personal history of colonic polyps: Secondary | ICD-10-CM

## 2012-04-24 DIAGNOSIS — Z7901 Long term (current) use of anticoagulants: Secondary | ICD-10-CM

## 2012-04-24 NOTE — Patient Instructions (Addendum)
You will be due for a recall colonoscopy in 12/2012. We will send you a reminder in the mail when it gets closer to that time. cc: Rodrigo Ran, MD

## 2012-04-24 NOTE — Progress Notes (Signed)
History of Present Illness: This is a 71 year old female who has a history of one adenomatous polyp on colonoscopy in June 2008. She has no gastrointestinal complaints and returns for followup for routine surveillance colonoscopy. She developed atrial fibrillation last fall and unfortunately suffered a CVA in February. She has been seeing Dr. Graciela Husbands frequently to manage paroxysmal atrial fibrillation. She is currently maintained on Xarelto. Denies weight loss, abdominal pain, constipation, diarrhea, change in stool caliber, melena, hematochezia, nausea, vomiting, dysphagia, reflux symptoms, chest pain.  No Known Allergies Outpatient Prescriptions Prior to Visit  Medication Sig Dispense Refill  . alendronate (FOSAMAX) 70 MG tablet Take 70 mg by mouth every 7 (seven) days. Take with a full glass of water on an empty stomach. Take on Wednesday      . atenolol (TENORMIN) 50 MG tablet Take 50 mg by mouth daily.       Marland Kitchen atorvastatin (LIPITOR) 20 MG tablet Take 20 mg by mouth every Monday, Wednesday, and Friday.       . Calcium Carbonate-Vitamin D (CALCIUM + D) 600-200 MG-UNIT TABS Take 1 tablet by mouth daily.       . Coenzyme Q10 (CO Q 10 PO) Take 1 tablet by mouth daily.      Marland Kitchen diltiazem (DILACOR XR) 120 MG 24 hr capsule Take 120 mg by mouth daily.      . fish oil-omega-3 fatty acids 1000 MG capsule Take 1 g by mouth 2 (two) times daily.       Marland Kitchen gabapentin (NEURONTIN) 100 MG capsule Take 100-200 mg by mouth 2 (two) times daily. Take 100mg  at noon daily and take 200mg  every night at bedtime      . pantoprazole (PROTONIX) 40 MG tablet Take 40 mg by mouth daily.       . potassium chloride (K-DUR,KLOR-CON) 10 MEQ tablet Take 1 tablet (10 mEq total) by mouth daily.  30 tablet  2  . predniSONE (DELTASONE) 5 MG tablet Take 5 mg by mouth daily.       . propafenone (RYTHMOL SR) 225 MG 12 hr capsule Take 1 capsule (225 mg total) by mouth 3 (three) times daily.  90 capsule  6  . Rivaroxaban (XARELTO) 20 MG TABS  Take 20 mg by mouth at bedtime.      . Sennosides (SENNA LAX PO) Take 1 tablet by mouth at bedtime as needed. For constipation      . temazepam (RESTORIL) 15 MG capsule Take 15 mg by mouth at bedtime.       . triamterene-hydrochlorothiazide (MAXZIDE-25) 37.5-25 MG per tablet Take 0.5 tablets by mouth daily.      . Vitamin D, Ergocalciferol, (DRISDOL) 50000 UNITS CAPS Take 50,000 Units by mouth every 14 (fourteen) days.       Past Medical History  Diagnosis Date  . Polymyalgia rheumatica     a. On chronic steroids  . Hypertension   . GERD (gastroesophageal reflux disease)   . Migraine headache   . Cervical disc disease   . Osteoporosis   . Glaucoma   . Osteoarthritis   . PAF (paroxysmal atrial fibrillation)     a.  Diagnosed 07/2011;  b. Normal nuclear myoview 07/2011 and normal EF at that time;  c. Most recent echo 2/20 /13 - EF 55-60% wit mild-mod MR.;  d. s/p DCCV 01/12/2012  . Hyperlipidemia     a. Diagnosed 12/2011  . CVA (cerebral infarction)     a. 11/2011  . Diverticulosis   . Internal hemorrhoid   .  Tubular adenoma of colon 04/2007   Past Surgical History  Procedure Date  . Breast lumpectomy 1980's    right  x2  . Tonsillectomy age 89  . Cardioversion 01/12/2012    Procedure: CARDIOVERSION;  Surgeon: Lewayne Bunting, MD;  Location: [facility] OR;  Service: Cardiovascular;  Laterality: N/A;  . Cardioversion 03/28/2012    Procedure: CARDIOVERSION;  Surgeon: Laurey Morale, MD;  Location: [facility] OR;  Service: Cardiovascular;  Laterality: N/A;   History   Social History  . Marital Status: Single    Spouse Name: N/A    Number of Children: 3  . Years of Education: N/A   Occupational History  . homemaker    Social History Main Topics  . Smoking status: Never Smoker   . Smokeless tobacco: Never Used  . Alcohol Use: Yes     SOCIAL  . Drug Use: No  . Sexually Active: None   Other Topics Concern  . None   Social History Narrative  . None   Family History  Problem Relation Age  of Onset  . Stroke Mother     Deceased @ 63  . Liver cancer Father     Deceased @ 8  . Colon cancer Paternal Grandmother   . Heart failure Mother     died from   Review of Systems: Pertinent positive and negative review of systems were noted in the above HPI section. All other review of systems were otherwise negative.  Physical Exam: General: Well developed , well nourished, no acute distress Head: Normocephalic and atraumatic Eyes:  sclerae anicteric, EOMI Ears: Normal auditory acuity Mouth: No deformity or lesions Neck: Supple, no masses or thyromegaly Lungs: Clear throughout to auscultation Heart: Regular rate and rhythm; no murmurs, rubs or bruits Abdomen: Soft, non tender and non distended. No masses, hepatosplenomegaly or hernias noted. Normal Bowel sounds Musculoskeletal: Symmetrical with no gross deformities  Skin: No lesions on visible extremities Pulses:  Normal pulses noted Extremities: No clubbing, cyanosis, edema or deformities noted Neurological: Alert oriented x 4, grossly nonfocal Cervical Nodes:  No significant cervical adenopathy Inguinal Nodes: No significant inguinal adenopathy Psychological:  Alert and cooperative. Normal mood and affect  Assessment and Recommendations:  1. Personal history of a small adenomatous colon polyp. Although she is now due for a 5 your surveillance colonoscopy her atrial fibrillation has not been under complete control and she suffered a cerebrovascular accident 6 months ago. It would be safest to wait until one year following her CVA and for her atrial fibrillation to be further stabilized prior to proceeding with an elective surveillance colonoscopy,  2. Paroxysmal atrial fibrillation.  3. Prior cerebrovascular accident.

## 2012-04-25 ENCOUNTER — Encounter (INDEPENDENT_AMBULATORY_CARE_PROVIDER_SITE_OTHER): Payer: Medicare Other

## 2012-04-25 DIAGNOSIS — M79609 Pain in unspecified limb: Secondary | ICD-10-CM

## 2012-04-25 DIAGNOSIS — R609 Edema, unspecified: Secondary | ICD-10-CM

## 2012-04-25 DIAGNOSIS — M7989 Other specified soft tissue disorders: Secondary | ICD-10-CM

## 2012-05-01 ENCOUNTER — Other Ambulatory Visit: Payer: Self-pay | Admitting: *Deleted

## 2012-05-01 DIAGNOSIS — I480 Paroxysmal atrial fibrillation: Secondary | ICD-10-CM

## 2012-05-01 MED ORDER — RIVAROXABAN 20 MG PO TABS: 20.0000 mg | ORAL_TABLET | Freq: Every day | ORAL | Status: DC

## 2012-05-07 ENCOUNTER — Encounter: Payer: Self-pay | Admitting: *Deleted

## 2012-05-11 ENCOUNTER — Other Ambulatory Visit: Payer: Self-pay | Admitting: Nurse Practitioner

## 2012-05-18 ENCOUNTER — Telehealth: Payer: Self-pay | Admitting: *Deleted

## 2012-05-18 NOTE — Telephone Encounter (Signed)
Call received from the patient stating she woke up about 2 am with uncontrollable shaking and chills that lasted about 30-45 minutes. She wanted to know if this was heart or medication related. I advised her this is not a typical symptom of either of those things. I explained those symptoms are typically related to an infectious process. I have advised she monitor this and if it reoccurs, she should take her temperature and touch base with her PCP. She voices understanding.

## 2012-05-19 ENCOUNTER — Ambulatory Visit (INDEPENDENT_AMBULATORY_CARE_PROVIDER_SITE_OTHER): Payer: Medicare Other | Admitting: Emergency Medicine

## 2012-05-19 VITALS — BP 128/72 | HR 70 | Temp 99.2°F | Resp 17 | Ht 62.0 in | Wt 126.0 lb

## 2012-05-19 DIAGNOSIS — N12 Tubulo-interstitial nephritis, not specified as acute or chronic: Secondary | ICD-10-CM

## 2012-05-19 DIAGNOSIS — R6883 Chills (without fever): Secondary | ICD-10-CM

## 2012-05-19 DIAGNOSIS — N10 Acute tubulo-interstitial nephritis: Secondary | ICD-10-CM

## 2012-05-19 LAB — POCT UA - MICROSCOPIC ONLY
Casts, Ur, LPF, POC: NEGATIVE
Mucus, UA: NEGATIVE
Yeast, UA: NEGATIVE

## 2012-05-19 LAB — POCT URINALYSIS DIPSTICK
Ketones, UA: NEGATIVE
Nitrite, UA: POSITIVE
Protein, UA: 30
Urobilinogen, UA: 0.2
pH, UA: 7

## 2012-05-19 LAB — POCT CBC
Granulocyte percent: 84.6 %G — AB (ref 37–80)
MID (cbc): 0.9 (ref 0–0.9)
MPV: 7.9 fL (ref 0–99.8)
POC Granulocyte: 11 — AB (ref 2–6.9)
POC LYMPH PERCENT: 8.3 %L — AB (ref 10–50)
POC MID %: 7.1 %M (ref 0–12)
Platelet Count, POC: 265 10*3/uL (ref 142–424)
RDW, POC: 14 %

## 2012-05-19 LAB — COMPREHENSIVE METABOLIC PANEL
ALT: 11 U/L (ref 0–35)
AST: 16 U/L (ref 0–37)
Calcium: 9.9 mg/dL (ref 8.4–10.5)
Chloride: 99 mEq/L (ref 96–112)
Creat: 1.08 mg/dL (ref 0.50–1.10)
Total Bilirubin: 0.8 mg/dL (ref 0.3–1.2)

## 2012-05-19 LAB — POCT SEDIMENTATION RATE: POCT SED RATE: 48 mm/hr — AB (ref 0–22)

## 2012-05-19 MED ORDER — SULFAMETHOXAZOLE-TRIMETHOPRIM 800-160 MG PO TABS: 1.0000 | ORAL_TABLET | Freq: Two times a day (BID) | ORAL | Status: AC

## 2012-05-19 NOTE — Progress Notes (Signed)
Date:  05/19/2012   Name:  Lorraine Little   DOB:  [birth year]   MRN:  [id number]  PCP:  Ezequiel Kayser, MD    Chief Complaint: Shaking   History of Present Illness:  Lorraine Little is a 71 y.o. very pleasant female patient who presents with the following:  Awakened Tuesday night from sound sleep with a shaking chill.  Lasted approx 45 minutes.  Had recurrent episode today.  No fever.  Myalgias and fatigue however.  No Pulmonary, ENT, GI, GU, or GYN symptoms.  Patient Active Problem List  Diagnosis  . Hypertension  . GERD (gastroesophageal reflux disease)  . Polymyalgia rheumatica  . PAF (paroxysmal atrial fibrillation)  . CVA (cerebral infarction)  . Dyslipidemia  . Edema    Past Medical History  Diagnosis Date  . Polymyalgia rheumatica     a. On chronic steroids  . Hypertension   . GERD (gastroesophageal reflux disease)   . Migraine headache   . Cervical disc disease   . Osteoporosis   . Glaucoma   . Osteoarthritis   . PAF (paroxysmal atrial fibrillation)     a.  Diagnosed 07/2011;  b. Normal nuclear myoview 07/2011 and normal EF at that time;  c. Most recent echo 2/20 /13 - EF 55-60% wit mild-mod MR.;  d. s/p DCCV 01/12/2012  . Hyperlipidemia     a. Diagnosed 12/2011  . CVA (cerebral infarction)     a. 11/2011  . Diverticulosis   . Internal hemorrhoid   . Tubular adenoma of colon 04/2007    Past Surgical History  Procedure Date  . Breast lumpectomy 1980's    right  x2  . Tonsillectomy age 61  . Cardioversion 01/12/2012    Procedure: CARDIOVERSION;  Surgeon: Lewayne Bunting, MD;  Location: [facility] OR;  Service: Cardiovascular;  Laterality: N/A;  . Cardioversion 03/28/2012    Procedure: CARDIOVERSION;  Surgeon: Laurey Morale, MD;  Location: [facility] OR;  Service: Cardiovascular;  Laterality: N/A;    History  Substance Use Topics  . Smoking status: Never Smoker   . Smokeless tobacco: Never Used  . Alcohol Use: Yes     SOCIAL    Family History  Problem Relation  Age of Onset  . Stroke Mother     Deceased @ 1  . Liver cancer Father     Deceased @ 78  . Colon cancer Paternal Grandmother   . Heart failure Mother     died from    No Known Allergies  Medication list has been reviewed and updated.  Current Outpatient Prescriptions on File Prior to Visit  Medication Sig Dispense Refill  . alendronate (FOSAMAX) 70 MG tablet Take 70 mg by mouth every 7 (seven) days. Take with a full glass of water on an empty stomach. Take on Wednesday      . atenolol (TENORMIN) 50 MG tablet Take 50 mg by mouth daily.       Marland Kitchen atorvastatin (LIPITOR) 20 MG tablet Take 20 mg by mouth every Monday, Wednesday, and Friday.       . Calcium Carbonate-Vitamin D (CALCIUM + D) 600-200 MG-UNIT TABS Take 1 tablet by mouth daily.       . Coenzyme Q10 (CO Q 10 PO) Take 1 tablet by mouth daily.      Marland Kitchen diltiazem (DILACOR XR) 120 MG 24 hr capsule Take 120 mg by mouth daily.      . fish oil-omega-3 fatty acids 1000 MG capsule Take 1 g  by mouth 2 (two) times daily.       Marland Kitchen gabapentin (NEURONTIN) 100 MG capsule Take 100-200 mg by mouth 2 (two) times daily. Take 100mg  at noon daily and take 200mg  every night at bedtime      . KLOR-CON M10 10 MEQ tablet TAKE 1 TABLET (10 MEQ TOTAL) BY MOUTH DAILY.  30 tablet  3  . pantoprazole (PROTONIX) 40 MG tablet Take 40 mg by mouth daily.       . predniSONE (DELTASONE) 5 MG tablet Take 5 mg by mouth daily.       . propafenone (RYTHMOL SR) 225 MG 12 hr capsule Take 1 capsule (225 mg total) by mouth 3 (three) times daily.  90 capsule  6  . Rivaroxaban (XARELTO) 20 MG TABS Take 1 tablet (20 mg total) by mouth at bedtime.  30 tablet  6  . Sennosides (SENNA LAX PO) Take 1 tablet by mouth at bedtime as needed. For constipation      . temazepam (RESTORIL) 15 MG capsule Take 15 mg by mouth at bedtime.       . triamterene-hydrochlorothiazide (MAXZIDE-25) 37.5-25 MG per tablet Take 0.5 tablets by mouth daily.      . Vitamin D, Ergocalciferol, (DRISDOL) 50000  UNITS CAPS Take 50,000 Units by mouth every 14 (fourteen) days.      Marland Kitchen DISCONTD: flecainide (TAMBOCOR) 100 MG tablet Take 100mg  1 tablet prn onset of atrial fib.  30 tablet  6    Review of Systems:  As per HPI, otherwise negative.    Physical Examination: Filed Vitals:   05/19/12 1337  BP: 128/72  Pulse: 70  Temp: 99.2 F (37.3 C)  Resp: 17   Filed Vitals:   05/19/12 1337  Height: 5\' 2"  (1.575 m)  Weight: 126 lb (57.153 kg)   Body mass index is 23.05 kg/(m^2). Ideal Body Weight: Weight in (lb) to have BMI = 25: 136.4   GEN: WDWN, NAD, Non-toxic, A & O x 3 HEENT: Atraumatic, Normocephalic. Neck supple. No masses, No LAD. Ears and Nose: No external deformity. CV: RRR, No M/G/R. No JVD. No thrill. No extra heart sounds. PULM: CTA B, no wheezes, crackles, rhonchi. No retractions. No resp. distress. No accessory muscle use. ABD: S, NT, ND, +BS. No rebound. No HSM. EXTR: No c/c/e NEURO Normal gait.  PSYCH: Normally interactive. Conversant. Not depressed or anxious appearing.  Calm demeanor.    Assessment and Plan: Pyelonephritis Septra Follow up as needed  Carmelina Dane, MD   Results for orders placed in visit on 05/19/12  POCT CBC      Component Value Range   WBC 13.0 (*) 4.6 - 10.2 K/uL   Lymph, poc 1.1  0.6 - 3.4   POC LYMPH PERCENT 8.3 (*) 10 - 50 %L   MID (cbc) 0.9  0 - 0.9   POC MID % 7.1  0 - 12 %M   POC Granulocyte 11.0 (*) 2 - 6.9   Granulocyte percent 84.6 (*) 37 - 80 %G   RBC 4.56  4.04 - 5.48 M/uL   Hemoglobin 13.1  12.2 - 16.2 g/dL   HCT, POC 45.4  09.8 - 47.9 %   MCV 95.6  80 - 97 fL   MCH, POC 28.7  27 - 31.2 pg   MCHC 30.0 (*) 31.8 - 35.4 g/dL   RDW, POC 11.9     Platelet Count, POC 265  142 - 424 K/uL   MPV 7.9  0 - 99.8  fL  POCT URINALYSIS DIPSTICK      Component Value Range   Color, UA yellow     Clarity, UA cloudy     Glucose, UA neg     Bilirubin, UA neg     Ketones, UA neg     Spec Grav, UA 1.015     Blood, UA mod     pH,  UA 7.0     Protein, UA 30     Urobilinogen, UA 0.2     Nitrite, UA positive     Leukocytes, UA large (3+)    POCT UA - MICROSCOPIC ONLY      Component Value Range   WBC, Ur, HPF, POC tntc     RBC, urine, microscopic tntc     Bacteria, U Microscopic neg     Mucus, UA neg     Epithelial cells, urine per micros neg     Crystals, Ur, HPF, POC neg     Casts, Ur, LPF, POC neg     Yeast, UA neg

## 2012-05-19 NOTE — Progress Notes (Deleted)
  Subjective:    Patient ID: Lorraine Little, female    DOB: 04/02/1941, 71 y.o.   MRN: [id number]  HPI    Review of Systems     Objective:   Physical Exam        Assessment & Plan:

## 2012-05-19 NOTE — Patient Instructions (Addendum)
Pyelonephritis, Adult Pyelonephritis is a kidney infection. In general, there are 2 main types of pyelonephritis:  Infections that come on quickly without any warning (acute pyelonephritis).   Infections that persist for a long period of time (chronic pyelonephritis).  CAUSES  Two main causes of pyelonephritis are:  Bacteria traveling from the bladder to the kidney. This is a problem especially in pregnant women. The urine in the bladder can become filled with bacteria from multiple causes, including:   Inflammation of the prostate gland (prostatitis).   Sexual intercourse in females.   Bladder infection (cystitis).   Bacteria traveling from the bloodstream to the tissue part of the kidney.  Problems that may increase your risk of getting a kidney infection include:  Diabetes.   Kidney stones or bladder stones.   Cancer.   Catheters placed in the bladder.   Other abnormalities of the kidney or ureter.  SYMPTOMS   Abdominal pain.   Pain in the side or flank area.   Fever.   Chills.   Upset stomach.   Blood in the urine (dark urine).   Frequent urination.   Strong or persistent urge to urinate.   Burning or stinging when urinating.  DIAGNOSIS  Your caregiver may diagnose your kidney infection based on your symptoms. A urine sample may also be taken. TREATMENT  In general, treatment depends on how severe the infection is.   If the infection is mild and caught early, your caregiver may treat you with oral antibiotics and send you home.   If the infection is more severe, the bacteria may have gotten into the bloodstream. This will require intravenous (IV) antibiotics and a hospital stay. Symptoms may include:   High fever.   Severe flank pain.   Shaking chills.   Even after a hospital stay, your caregiver may require you to be on oral antibiotics for a period of time.   Other treatments may be required depending upon the cause of the infection.  HOME CARE  INSTRUCTIONS   Take your antibiotics as directed. Finish them even if you start to feel better.   Make an appointment to have your urine checked to make sure the infection is gone.   Drink enough fluids to keep your urine clear or pale yellow.   Take medicines for the bladder if you have urgency and frequency of urination as directed by your caregiver.  SEEK IMMEDIATE MEDICAL CARE IF:   You have a fever.   You are unable to take your antibiotics or fluids.   You develop shaking chills.   You experience extreme weakness or fainting.   There is no improvement after 2 days of treatment.  MAKE SURE YOU:  Understand these instructions.   Will watch your condition.   Will get help right away if you are not doing well or get worse.  Document Released: 10/17/2005 Document Revised: 10/06/2011 Document Reviewed: 03/23/2011 [employer] Patient Information 2012 [employer]. 

## 2012-05-23 ENCOUNTER — Encounter: Payer: Self-pay | Admitting: Emergency Medicine

## 2012-05-25 ENCOUNTER — Telehealth: Payer: Self-pay

## 2012-05-25 NOTE — Telephone Encounter (Signed)
PT STATES SHE HAD BLOOD WORK DONE AND DIDN'T KNOW IF WE SENT A COPY TO HER REGULAR DR OR MAILED HER A COPY IF NOT, SHE WOULD LIKE TO COME BY AND P/U PLEASE CALL [phone]

## 2012-05-28 NOTE — Telephone Encounter (Signed)
Called pt to advise have mailed her a copy.

## 2012-06-18 ENCOUNTER — Other Ambulatory Visit: Payer: Self-pay | Admitting: Dermatology

## 2012-06-25 ENCOUNTER — Telehealth: Payer: Self-pay | Admitting: Internal Medicine

## 2012-06-25 ENCOUNTER — Telehealth: Payer: Self-pay | Admitting: *Deleted

## 2012-06-25 ENCOUNTER — Ambulatory Visit (INDEPENDENT_AMBULATORY_CARE_PROVIDER_SITE_OTHER): Payer: Medicare Other | Admitting: *Deleted

## 2012-06-25 DIAGNOSIS — I4891 Unspecified atrial fibrillation: Secondary | ICD-10-CM

## 2012-06-25 NOTE — Telephone Encounter (Signed)
Patient states, has been out of rhythm since Saturday, no other symptoms. Pt has an appointment for an EKG this AM.

## 2012-06-25 NOTE — Telephone Encounter (Signed)
Patient is aware to come tomorrow to the office to see Dr. Graciela Husbands at 8:30 AM. Lorraine Little will help MD with clinic. Dr. Graciela Husbands would like to be call when pt arrives.

## 2012-06-25 NOTE — Patient Instructions (Addendum)
Patient refused to see the PA. She has an appointment with Dr. Graciela Husbands on Friday 06/29/12 at 11:15 AM she would like to be seen on Wednesday by Dr Graciela Husbands. Pt is to go to the ER if has syncope episode,SOB or dizziness.

## 2012-06-25 NOTE — Progress Notes (Signed)
Patient states her heart has been out of rhythm since this Past Saturday. Pt C/O of having cold sweats, and Dizziness on saturday. Today pt came in this AM for an EKG. Pt C/O of slight dizziness when walking. EKG done , read by Dr Ladona Ridgel DOD A-fib rate 93 beats/minute. Dr. Ladona Ridgel recommended for pt to see Dr. Graciela Husbands this week. Pt is to go to the ER if has syncope episode and SOB. Patient has an appointment with Dr. Graciela Husbands on Friday 06/29/12 at 11:15 AM. Patient would like to be seen on Wednesday by Dr. Graciela Husbands. Pt refused to see  Tereso Newcomer PA on Wednesday when Dr. Graciela Husbands is in the office. She will call Heather on Wednesday AM to see if she can overbook MD's schedule.

## 2012-06-25 NOTE — Telephone Encounter (Signed)
New Problem:    Patient has been out of rhythm since Saturday and would like to know how to proceed.  Please call back.

## 2012-06-26 ENCOUNTER — Encounter: Payer: Self-pay | Admitting: Internal Medicine

## 2012-06-26 ENCOUNTER — Ambulatory Visit (INDEPENDENT_AMBULATORY_CARE_PROVIDER_SITE_OTHER): Payer: Medicare Other | Admitting: Internal Medicine

## 2012-06-26 ENCOUNTER — Encounter: Payer: Self-pay | Admitting: *Deleted

## 2012-06-26 VITALS — BP 122/78 | HR 100 | Ht 62.0 in | Wt 124.0 lb

## 2012-06-26 DIAGNOSIS — I480 Paroxysmal atrial fibrillation: Secondary | ICD-10-CM

## 2012-06-26 DIAGNOSIS — I10 Essential (primary) hypertension: Secondary | ICD-10-CM

## 2012-06-26 DIAGNOSIS — I4891 Unspecified atrial fibrillation: Secondary | ICD-10-CM

## 2012-06-26 LAB — CBC WITH DIFFERENTIAL/PLATELET
Basophils Absolute: 0 10*3/uL (ref 0.0–0.1)
Eosinophils Absolute: 0.1 10*3/uL (ref 0.0–0.7)
Lymphocytes Relative: 22 % (ref 12.0–46.0)
MCHC: 32.6 g/dL (ref 30.0–36.0)
Neutrophils Relative %: 64.6 % (ref 43.0–77.0)
Platelets: 259 10*3/uL (ref 150.0–400.0)
RDW: 15.1 % — ABNORMAL HIGH (ref 11.5–14.6)

## 2012-06-26 LAB — BASIC METABOLIC PANEL
CO2: 31 mEq/L (ref 19–32)
Calcium: 9.2 mg/dL (ref 8.4–10.5)
Creatinine, Ser: 1.2 mg/dL (ref 0.4–1.2)
Glucose, Bld: 89 mg/dL (ref 70–99)

## 2012-06-26 MED ORDER — DILTIAZEM HCL ER 240 MG PO CP24: 240.0000 mg | ORAL_CAPSULE | Freq: Every day | ORAL | Status: DC

## 2012-06-26 MED ORDER — PROPAFENONE HCL 150 MG PO TABS: 300.0000 mg | ORAL_TABLET | Freq: Two times a day (BID) | ORAL | Status: DC

## 2012-06-26 NOTE — Assessment & Plan Note (Signed)
Quite symptomatic both 2/2 rate and rhyhtm  Had syncope on flecanide and tolerating propafenone  Will increase rate control dilt 120 daily?>>bid and increase propafenone 150 q8>>300 q12 and plan  DCCV later this week  Will discuss at next visit the role of RFCA  Currently she is reluctant

## 2012-06-26 NOTE — Assessment & Plan Note (Signed)
stable °

## 2012-06-26 NOTE — Progress Notes (Signed)
HPI  Lorraine Little is a 71 y.o. female Seen in followup for atrial fibrillation. She underwent cardioversion may 2013 while on Rythmol  She has a history of CVA. She is on  Rivaroxaban   Cardiac evaluation has included a normal Myoview echo with left atrial dimension of 33  She has had one intercurrent episode of atrial fibrillation;  it lasted just a few hours.  Also, she has noted quite embarrassing flatulence. Review suggested this may be related to her pradaxa  She notes unilateral swelling of her left leg new in the last few months.  Past Medical History  Diagnosis Date  . Polymyalgia rheumatica     a. On chronic steroids  . Hypertension   . GERD (gastroesophageal reflux disease)   . Migraine headache   . Cervical disc disease   . Osteoporosis   . Glaucoma   . Osteoarthritis   . PAF (paroxysmal atrial fibrillation)     a.  Diagnosed 07/2011;  b. Normal nuclear myoview 07/2011 and normal EF at that time;  c. Most recent echo 2/20 /13 - EF 55-60% wit mild-mod MR.;  d. s/p DCCV 01/12/2012  . Hyperlipidemia     a. Diagnosed 12/2011  . CVA (cerebral infarction)     a. 11/2011  . Diverticulosis   . Internal hemorrhoid   . Tubular adenoma of colon 04/2007    Past Surgical History  Procedure Date  . Breast lumpectomy 1980's    right  x2  . Tonsillectomy age 89  . Cardioversion 01/12/2012    Procedure: CARDIOVERSION;  Surgeon: Lewayne Bunting, MD;  Location: [facility] OR;  Service: Cardiovascular;  Laterality: N/A;  . Cardioversion 03/28/2012    Procedure: CARDIOVERSION;  Surgeon: Laurey Morale, MD;  Location: [facility] OR;  Service: Cardiovascular;  Laterality: N/A;    Current Outpatient Prescriptions  Medication Sig Dispense Refill  . alendronate (FOSAMAX) 70 MG tablet Take 70 mg by mouth every 7 (seven) days. Take with a full glass of water on an empty stomach. Take on Wednesday      . atenolol (TENORMIN) 50 MG tablet Take 50 mg by mouth daily.       . Calcium Carbonate-Vitamin D  (CALCIUM + D) 600-200 MG-UNIT TABS Take 1 tablet by mouth daily.       . Coenzyme Q10 (CO Q 10 PO) Take 1 tablet by mouth daily.      Marland Kitchen diltiazem (DILACOR XR) 120 MG 24 hr capsule Take 120 mg by mouth daily.      . fish oil-omega-3 fatty acids 1000 MG capsule Take 1 g by mouth 2 (two) times daily.       Marland Kitchen gabapentin (NEURONTIN) 100 MG capsule Take 100-200 mg by mouth 2 (two) times daily. Take 100mg  at noon daily and take 200mg  every night at bedtime      . KLOR-CON M10 10 MEQ tablet TAKE 1 TABLET (10 MEQ TOTAL) BY MOUTH DAILY.  30 tablet  3  . pantoprazole (PROTONIX) 40 MG tablet Take 40 mg by mouth daily.       . predniSONE (DELTASONE) 5 MG tablet Take 5 mg by mouth daily.       . propafenone (RYTHMOL SR) 225 MG 12 hr capsule Take 1 capsule (225 mg total) by mouth 3 (three) times daily.  90 capsule  6  . Rivaroxaban (XARELTO) 20 MG TABS Take 1 tablet (20 mg total) by mouth at bedtime.  30 tablet  6  . Sennosides (SENNA LAX  PO) Take 1 tablet by mouth at bedtime as needed. For constipation      . temazepam (RESTORIL) 15 MG capsule Take 15 mg by mouth at bedtime.       . triamterene-hydrochlorothiazide (MAXZIDE-25) 37.5-25 MG per tablet Take 0.5 tablets by mouth daily.      . Vitamin D, Ergocalciferol, (DRISDOL) 50000 UNITS CAPS Take 50,000 Units by mouth every 14 (fourteen) days.      Marland Kitchen DISCONTD: flecainide (TAMBOCOR) 100 MG tablet Take 100mg  1 tablet prn onset of atrial fib.  30 tablet  6    No Known Allergies  Review of Systems negative except from HPI and PMH  Physical Exam BP 122/78  Pulse 100  Ht 5\' 2"  (1.575 m)  Wt 124 lb (56.246 kg)  BMI 22.68 kg/m2  SpO2 98% Well developed and well nourished in no acute distress HENT normal E scleral and icterus clear Neck Supple JVP flat; carotids brisk and full Clear to ausculation Irregular rate and rhythm, no murmurs gallops or rub Soft with active bowel sounds No clubbing cyanosis mild left versus minimal right Edema Alert and  oriented, grossly normal motor and sensory function Skin Warm and Dry  Ecg af @ 100  Assessment and  Plan

## 2012-06-26 NOTE — Patient Instructions (Addendum)
Your physician recommends that you schedule a follow-up appointment in: 4-6 weeks with Dr. Graciela Husbands.  LABS TODAY:  CBC & BMET.  Increase Rythmol to 300mg  twice daily, 12 hours apart.  Increase Diltiazem to 240mg  daily.

## 2012-06-27 ENCOUNTER — Other Ambulatory Visit: Payer: Self-pay | Admitting: *Deleted

## 2012-06-27 DIAGNOSIS — E876 Hypokalemia: Secondary | ICD-10-CM

## 2012-06-29 ENCOUNTER — Encounter (HOSPITAL_COMMUNITY): Payer: Self-pay | Admitting: Certified Registered"

## 2012-06-29 ENCOUNTER — Ambulatory Visit (HOSPITAL_COMMUNITY)
Admission: RE | Admit: 2012-06-29 | Discharge: 2012-06-29 | Disposition: A | Discharge status: Home / Self Care | Payer: Medicare Other | Source: Ambulatory Visit | Attending: Internal Medicine | Admitting: Internal Medicine

## 2012-06-29 ENCOUNTER — Encounter (HOSPITAL_COMMUNITY): Payer: Self-pay | Admitting: Anesthesiology

## 2012-06-29 ENCOUNTER — Ambulatory Visit: Payer: Medicare Other | Admitting: Internal Medicine

## 2012-06-29 ENCOUNTER — Ambulatory Visit (HOSPITAL_COMMUNITY): Payer: Medicare Other | Admitting: Anesthesiology

## 2012-06-29 ENCOUNTER — Encounter (HOSPITAL_COMMUNITY)
Admission: RE | Disposition: A | Discharge status: Home / Self Care | Payer: Self-pay | Source: Ambulatory Visit | Attending: Internal Medicine

## 2012-06-29 DIAGNOSIS — I10 Essential (primary) hypertension: Secondary | ICD-10-CM | POA: Insufficient documentation

## 2012-06-29 DIAGNOSIS — Z8673 Personal history of transient ischemic attack (TIA), and cerebral infarction without residual deficits: Secondary | ICD-10-CM | POA: Insufficient documentation

## 2012-06-29 DIAGNOSIS — K219 Gastro-esophageal reflux disease without esophagitis: Secondary | ICD-10-CM | POA: Insufficient documentation

## 2012-06-29 DIAGNOSIS — E876 Hypokalemia: Secondary | ICD-10-CM

## 2012-06-29 DIAGNOSIS — I4891 Unspecified atrial fibrillation: Secondary | ICD-10-CM

## 2012-06-29 DIAGNOSIS — I480 Paroxysmal atrial fibrillation: Secondary | ICD-10-CM

## 2012-06-29 HISTORY — PX: CARDIOVERSION: SHX1299

## 2012-06-29 SURGERY — CARDIOVERSION
Anesthesia: Monitor Anesthesia Care | Wound class: Clean

## 2012-06-29 MED ORDER — CO Q 10 100 MG PO CAPS: 200.0000 mg | ORAL_CAPSULE | Freq: Every day | ORAL | Status: DC

## 2012-06-29 MED ORDER — SODIUM CHLORIDE 0.9 % IV SOLN
INTRAVENOUS | Status: DC
  Administered 2012-06-29: 500 mL via INTRAVENOUS

## 2012-06-29 MED ORDER — POTASSIUM CHLORIDE CRYS ER 20 MEQ PO TBCR: 20.0000 meq | EXTENDED_RELEASE_TABLET | Freq: Every day | ORAL | Status: DC

## 2012-06-29 MED ORDER — DEXAMETHASONE SODIUM PHOSPHATE 4 MG/ML IJ SOLN: INTRAMUSCULAR | Status: DC | PRN

## 2012-06-29 MED ORDER — BUPIVACAINE-EPINEPHRINE PF 0.5-1:200000 % IJ SOLN: INTRAMUSCULAR | Status: DC | PRN

## 2012-06-29 MED ORDER — TRIAMTERENE-HCTZ 37.5-25 MG PO TABS: 0.5000 | ORAL_TABLET | Freq: Every day | ORAL | Status: DC

## 2012-06-29 MED ORDER — PROPOFOL 10 MG/ML IV EMUL
INTRAVENOUS | Status: DC | PRN
  Administered 2012-06-29: 80 mg via INTRAVENOUS

## 2012-06-29 MED ORDER — PREDNISONE 5 MG PO TABS: 5.0000 mg | ORAL_TABLET | Freq: Every day | ORAL | Status: DC

## 2012-06-29 MED ORDER — LIDOCAINE HCL (CARDIAC) 20 MG/ML IV SOLN
INTRAVENOUS | Status: DC | PRN
  Administered 2012-06-29: 60 mg via INTRAVENOUS

## 2012-06-29 MED ORDER — PROPAFENONE HCL 150 MG PO TABS
300.0000 mg | ORAL_TABLET | Freq: Two times a day (BID) | ORAL | Status: DC
Start: 2012-06-29 — End: 2012-06-29

## 2012-06-29 MED ORDER — VITAMIN D (ERGOCALCIFEROL) 1.25 MG (50000 UNIT) PO CAPS: 50000.0000 [IU] | ORAL_CAPSULE | ORAL | Status: DC

## 2012-06-29 MED ORDER — DILTIAZEM HCL ER 240 MG PO CP24: 240.0000 mg | ORAL_CAPSULE | Freq: Every day | ORAL | Status: DC

## 2012-06-29 MED ORDER — SODIUM CHLORIDE 0.9 % IV SOLN
INTRAVENOUS | Status: DC | PRN
  Administered 2012-06-29: 10:00:00 via INTRAVENOUS

## 2012-06-29 MED ORDER — RIVAROXABAN 20 MG PO TABS: 20.0000 mg | ORAL_TABLET | Freq: Every day | ORAL | Status: DC

## 2012-06-29 NOTE — Transfer of Care (Signed)
Immediate Anesthesia Transfer of Care Note  Patient: Lorraine Little  Procedure(s) Performed: Procedure(s) (LRB): CARDIOVERSION (N/A)  Patient Location: PACU and Endoscopy Unit  Anesthesia Type: General  Level of Consciousness: awake, alert  and oriented  Airway & Oxygen Therapy: Patient Spontanous Breathing  Post-op Assessment: Post -op Vital signs reviewed and stable  Post vital signs: stable  Complications: No apparent anesthesia complications

## 2012-06-29 NOTE — Anesthesia Preprocedure Evaluation (Addendum)
Anesthesia Evaluation  Patient identified by MRN, date of birth, ID band Patient awake    Reviewed: Allergy & Precautions, H&P , NPO status , Patient's Chart, lab work & pertinent test results, reviewed documented beta blocker date and time   History of Anesthesia Complications Negative for: history of anesthetic complications  Airway Mallampati: I TM Distance: <3 FB Neck ROM: Limited    Dental  (+) Teeth Intact   Pulmonary neg pulmonary ROS,  breath sounds clear to auscultation        Cardiovascular hypertension, Pt. on medications + dysrhythmias Atrial Fibrillation Rhythm:Irregular Rate:Normal     Neuro/Psych  Headaches,  Neuromuscular disease CVA, No Residual Symptoms negative psych ROS   GI/Hepatic Neg liver ROS, GERD-  Medicated and Controlled,  Endo/Other  negative endocrine ROS  Renal/GU negative Renal ROS  negative genitourinary   Musculoskeletal   Abdominal (+)  Abdomen: soft. Bowel sounds: normal.  Peds negative pediatric ROS (+)  Hematology negative hematology ROS (+)   Anesthesia Other Findings   Reproductive/Obstetrics negative OB ROS                         Anesthesia Physical Anesthesia Plan  ASA: III  Anesthesia Plan: General   Post-op Pain Management:    Induction: Intravenous  Airway Management Planned: Mask  Additional Equipment:   Intra-op Plan:   Post-operative Plan: Extubation in OR  Informed Consent: I have reviewed the patients History and Physical, chart, labs and discussed the procedure including the risks, benefits and alternatives for the proposed anesthesia with the patient or authorized representative who has indicated his/her understanding and acceptance.     Plan Discussed with: CRNA and Surgeon  Anesthesia Plan Comments:         Anesthesia Quick Evaluation

## 2012-06-29 NOTE — Anesthesia Postprocedure Evaluation (Signed)
  Anesthesia Post-op Note  Patient: Lorraine Little  Procedure(s) Performed: Procedure(s) (LRB): CARDIOVERSION (N/A)  Patient Location: PACU and Endoscopy Unit  Anesthesia Type: General  Level of Consciousness: awake  Airway and Oxygen Therapy: Patient Spontanous Breathing  Post-op Pain: none  Post-op Assessment: Post-op Vital signs reviewed, Patient's Cardiovascular Status Stable, Respiratory Function Stable, Patent Airway, No signs of Nausea or vomiting and Pain level controlled  Post-op Vital Signs: stable  Complications: No apparent anesthesia complications

## 2012-06-29 NOTE — Anesthesia Postprocedure Evaluation (Signed)
  Anesthesia Post-op Note  Patient: Lorraine Little  Procedure(s) Performed: Procedure(s) (LRB): CARDIOVERSION (N/A)  Patient Location: PACU and Endoscopy Unit  Anesthesia Type: General  Level of Consciousness: awake, alert  and oriented  Airway and Oxygen Therapy: Patient Spontanous Breathing  Post-op Pain: none  Post-op Assessment: Post-op Vital signs reviewed  Post-op Vital Signs: stable  Complications: No apparent anesthesia complications

## 2012-06-29 NOTE — Anesthesia Procedure Notes (Signed)
Anesthesia Regional Block:  Femoral nerve block  Pre-Anesthetic Checklist: ,, timeout performed, Correct Patient, Correct Site, Correct Laterality, Correct Procedure, Correct Position, site marked, Risks and benefits discussed,  Surgical consent,  Pre-op evaluation,  At surgeon's request and post-op pain management  Laterality: Left  Prep: chloraprep       Needles:  Injection technique: Single-shot  Needle Type: Stimulator Needle - 80     Needle Length:cm 4 cm Needle Gauge: 22 and 22 G    Additional Needles:  Procedures: nerve stimulator Femoral nerve block  Nerve Stimulator or Paresthesia:  Response: 0.48 mA,   Additional Responses:   Narrative:  Start time: 06/29/2012 9:58 AM End time: 06/29/2012 10:14 AM Injection made incrementally with aspirations every 5 mL. Anesthesiologist: Dr Gypsy Balsam  Additional Notes: 1610-9604 L FNB POP CHG prep, sterile tech #[id number] stim needle w/stim down to .48ma Multiple neg asp Marc .5% w/epi 25cc+decadron 4mg  infiltrated No compl Dr Gypsy Balsam

## 2012-06-29 NOTE — CV Procedure (Signed)
Electrical Cardioversion Procedure Note Lorraine Little [id number] 1941-02-20  Procedure: Electrical Cardioversion Indications:  Atrial Fibrillation  Procedure Details Consent: Risks of procedure as well as the alternatives and risks of each were explained to the (patient/caregiver).  Consent for procedure obtained. Time Out: Verified patient identification, verified procedure, site/side was marked, verified correct patient position, special equipment/implants available, medications/allergies/relevent history reviewed, required imaging and test results available.  Performed  Patient placed on cardiac monitor, pulse oximetry, supplemental oxygen as necessary.  Sedation given: Etomidate Pacer pads placed anterior and posterior chest.  Cardioverted 1 time(s).  Cardioverted at 120J.  Evaluation Findings: Post procedure EKG shows: NSR Complications: None Patient did tolerate procedure well.   Keva Darty 06/29/2012, 10:46 AM

## 2012-06-29 NOTE — Preoperative (Signed)
Beta Blockers   Reason not to administer Beta Blockers:Not Applicable 

## 2012-07-03 ENCOUNTER — Encounter (HOSPITAL_COMMUNITY): Payer: Self-pay | Admitting: Cardiology

## 2012-07-11 ENCOUNTER — Other Ambulatory Visit (INDEPENDENT_AMBULATORY_CARE_PROVIDER_SITE_OTHER): Payer: Medicare Other

## 2012-07-11 DIAGNOSIS — E876 Hypokalemia: Secondary | ICD-10-CM

## 2012-07-12 LAB — BASIC METABOLIC PANEL
CO2: 29 mEq/L (ref 19–32)
Calcium: 9.4 mg/dL (ref 8.4–10.5)
Creatinine, Ser: 1.2 mg/dL (ref 0.4–1.2)
GFR: 48.64 mL/min — ABNORMAL LOW (ref >60.00)
Glucose, Bld: 69 mg/dL — ABNORMAL LOW (ref 70–99)
Sodium: 139 mEq/L (ref 135–145)

## 2012-07-13 ENCOUNTER — Other Ambulatory Visit: Payer: Self-pay | Admitting: *Deleted

## 2012-07-13 DIAGNOSIS — E876 Hypokalemia: Secondary | ICD-10-CM

## 2012-07-13 MED ORDER — POTASSIUM CHLORIDE CRYS ER 10 MEQ PO TBCR: EXTENDED_RELEASE_TABLET | ORAL | Status: DC

## 2012-07-13 NOTE — Telephone Encounter (Signed)
Refilled potassium with correct dose

## 2012-07-30 ENCOUNTER — Telehealth: Payer: Self-pay | Admitting: *Deleted

## 2012-07-30 ENCOUNTER — Telehealth: Payer: Self-pay | Admitting: Internal Medicine

## 2012-07-30 NOTE — Telephone Encounter (Signed)
plz return call to patient, who is out of rhythm [phone].  Just started feeling this way this morning.

## 2012-07-30 NOTE — Telephone Encounter (Signed)
Pt rtn your call

## 2012-07-30 NOTE — Telephone Encounter (Signed)
I spoke with the patient. She states she went out of rhythm this morning. She is not due to take her rhythmol dose until 11:00 am. She wanted to know if she is ok to go ahead and take this now. I advised she may go ahead and take her morning dose now to see if this will break her a-fib. She is agreeable. She will let me know if she does not go back in rhythm.

## 2012-07-30 NOTE — Telephone Encounter (Signed)
The patient called back this afternoon stating she is still in a-fib after taking her rhythmol dose this morning. I have sent a message to Dr. Graciela Husbands to see what his recommendations would be. He states the patient may need to consider amiodarone vs tikosyn. I have made the patient aware of this. Dr. Graciela Husbands has had some cancellations on his schedule tomorrow. The patient will come in tomorrow at 11:45 am to discuss.

## 2012-07-30 NOTE — Telephone Encounter (Signed)
I left a message for the patient to call. 

## 2012-07-31 ENCOUNTER — Encounter: Payer: Self-pay | Admitting: *Deleted

## 2012-07-31 ENCOUNTER — Ambulatory Visit (INDEPENDENT_AMBULATORY_CARE_PROVIDER_SITE_OTHER): Payer: Medicare Other | Admitting: Internal Medicine

## 2012-07-31 ENCOUNTER — Encounter: Payer: Self-pay | Admitting: Internal Medicine

## 2012-07-31 VITALS — BP 127/71 | HR 98 | Ht 63.0 in | Wt 127.4 lb

## 2012-07-31 DIAGNOSIS — H2513 Age-related nuclear cataract, bilateral: Secondary | ICD-10-CM | POA: Insufficient documentation

## 2012-07-31 DIAGNOSIS — I4891 Unspecified atrial fibrillation: Secondary | ICD-10-CM

## 2012-07-31 DIAGNOSIS — H402290 Chronic angle-closure glaucoma, unspecified eye, stage unspecified: Secondary | ICD-10-CM | POA: Insufficient documentation

## 2012-07-31 DIAGNOSIS — H40039 Anatomical narrow angle, unspecified eye: Secondary | ICD-10-CM | POA: Insufficient documentation

## 2012-07-31 DIAGNOSIS — I480 Paroxysmal atrial fibrillation: Secondary | ICD-10-CM

## 2012-07-31 DIAGNOSIS — H40009 Preglaucoma, unspecified, unspecified eye: Secondary | ICD-10-CM | POA: Insufficient documentation

## 2012-07-31 DIAGNOSIS — H2510 Age-related nuclear cataract, unspecified eye: Secondary | ICD-10-CM | POA: Insufficient documentation

## 2012-07-31 DIAGNOSIS — H40033 Anatomical narrow angle, bilateral: Secondary | ICD-10-CM | POA: Insufficient documentation

## 2012-07-31 LAB — BASIC METABOLIC PANEL
BUN: 20 mg/dL (ref 6–23)
Chloride: 99 mEq/L (ref 96–112)
Creatinine, Ser: 1.1 mg/dL (ref 0.4–1.2)
GFR: 52.81 mL/min — ABNORMAL LOW (ref >60.00)
Potassium: 4.4 mEq/L (ref 3.5–5.1)

## 2012-07-31 LAB — PROTIME-INR: Prothrombin Time: 13.9 s — ABNORMAL HIGH (ref 10.2–12.4)

## 2012-07-31 LAB — APTT: aPTT: 31.6 s — ABNORMAL HIGH (ref 21.7–28.8)

## 2012-07-31 MED ORDER — AMIODARONE HCL 200 MG PO TABS: 400.0000 mg | ORAL_TABLET | Freq: Two times a day (BID) | ORAL | Status: DC

## 2012-07-31 MED ORDER — AMIODARONE HCL 200 MG PO TABS: 400.0000 mg | ORAL_TABLET | Freq: Every day | ORAL | Status: DC

## 2012-07-31 NOTE — Patient Instructions (Addendum)
Stop Rythmol.  Start Amiodarone 400mg  twice daily.  Please schedule pt as a new pt eval with Dr. Johney Frame to discuss A-Fib Ablation.  Cardioversion.

## 2012-07-31 NOTE — Assessment & Plan Note (Signed)
She is having persistent atrial fibrillation. We have discussed drug strategies including amiodarone versus dofetilide. We discussed the side effects of the former versus the hospitalization of the latter.  This is considered with the intermediate term strategy of referral for catheter ablation of her atrial fibrillation substrate. Given that, she would like to undertake amiodarone. We'll be getting 400 mg twice daily and anticipate cardioversion next week. Her anticoagulant is Rivaroxaban.

## 2012-07-31 NOTE — Progress Notes (Signed)
Patient Care Team: Mark A Perini, MD as PCP - General (Internal Medicine)   HPI  Lorraine Little is a 71 y.o. female een in followup for atrial fibrillation. She underwent cardioversion may 2013 while on Rythmol previously having had syncope while on flecainide. She was cardioverted again  Early September and has reverted again 2 atrial fibrillation She has a history of CVA. She is on Rivaroxaban  Cardiac evaluation has included a normal Myoview echo with left atrial dimension of 33   She continues to have symptoms with her atrial fibrillation.       Past Medical History  Diagnosis Date  . Polymyalgia rheumatica     a. On chronic steroids  . Hypertension   . GERD (gastroesophageal reflux disease)   . Migraine headache   . Cervical disc disease   . Osteoporosis   . Glaucoma   . Osteoarthritis   . PAF (paroxysmal atrial fibrillation)     a.  Diagnosed 07/2011;  b. Normal nuclear myoview 07/2011 and normal EF at that time;  c. Most recent echo 2/20 /13 - EF 55-60% wit mild-mod MR.;  d. s/p DCCV 01/12/2012  . Hyperlipidemia     a. Diagnosed 12/2011  . CVA (cerebral infarction)     a. 11/2011  . Diverticulosis   . Internal hemorrhoid   . Tubular adenoma of colon 04/2007    Past Surgical History  Procedure Date  . Breast lumpectomy 1980's    right  x2  . Tonsillectomy age 4  . Cardioversion 01/12/2012    Procedure: CARDIOVERSION;  Surgeon: Brian S Crenshaw, MD;  Location: [facility] OR;  Service: Cardiovascular;  Laterality: N/A;  . Cardioversion 03/28/2012    Procedure: CARDIOVERSION;  Surgeon: Dalton S McLean, MD;  Location: [facility] OR;  Service: Cardiovascular;  Laterality: N/A;  . Cardioversion 06/29/2012    Procedure: CARDIOVERSION;  Surgeon: Shuck Brackbill, MD;  Location: [facility] ENDOSCOPY;  Service: Cardiovascular;  Laterality: N/A;    Current Outpatient Prescriptions  Medication Sig Dispense Refill  . alendronate (FOSAMAX) 70 MG tablet Take 70 mg by mouth every 7 (seven) days. Take  with a full glass of water on an empty stomach. Take on Wednesday      . atenolol (TENORMIN) 50 MG tablet Take 50 mg by mouth daily.       . Calcium Carbonate-Vitamin D (CALCIUM + D) 600-200 MG-UNIT TABS Take 1 tablet by mouth daily.       . Coenzyme Q10 (CO Q 10 PO) Take 1 tablet by mouth daily.      . diltiazem (DILACOR XR) 240 MG 24 hr capsule Take 1 capsule (240 mg total) by mouth daily.  30 capsule  11  . fish oil-omega-3 fatty acids 1000 MG capsule Take 1 g by mouth 2 (two) times daily.       . gabapentin (NEURONTIN) 100 MG capsule Take 100-200 mg by mouth 2 (two) times daily. Take 100mg at noon daily and take 200mg every night at bedtime      . pantoprazole (PROTONIX) 40 MG tablet Take 40 mg by mouth daily.       . potassium chloride (KLOR-CON M10) 10 MEQ tablet Take two tablets by mouth once daily  60 tablet  11  . predniSONE (DELTASONE) 5 MG tablet Take 5 mg by mouth daily.       . propafenone (RYTHMOL) 150 MG tablet Take 2 tablets (300 mg total) by mouth every 12 (twelve) hours.  240 tablet  11  .   Rivaroxaban (XARELTO) 20 MG TABS Take 1 tablet (20 mg total) by mouth at bedtime.  30 tablet  6  . Sennosides (SENNA LAX PO) Take 1 tablet by mouth at bedtime as needed. For constipation      . temazepam (RESTORIL) 15 MG capsule Take 15 mg by mouth at bedtime.       . triamterene-hydrochlorothiazide (MAXZIDE-25) 37.5-25 MG per tablet Take 0.5 tablets by mouth daily.      . Vitamin D, Ergocalciferol, (DRISDOL) 50000 UNITS CAPS Take 50,000 Units by mouth every 14 (fourteen) days.      . DISCONTD: flecainide (TAMBOCOR) 100 MG tablet Take 100mg 1 tablet prn onset of atrial fib.  30 tablet  6    No Known Allergies  Review of Systems negative except from HPI and PMH  Physical Exam BP 127/71  Pulse 98  Ht 5' 3" (1.6 m)  Wt 127 lb 6.4 oz (57.788 kg)  BMI 22.57 kg/m2 Well developed and nourished in no acute distress HENT normal Neck supple with JVP-flat Carotids brisk and full without  bruits Clear Irregularly irregular rate and rhythm with controlled ventricular response, no murmurs or gallops Abd-soft with active BS without hepatomegaly No Clubbing cyanosis edema Skin-warm and dry A & Oriented  Grossly normal sensory and motor function  electrocardiogram demonstrates atrial fibrillation at 80 Intervals-/07/34 Axis LXVII Assessment and  Plan  

## 2012-08-07 ENCOUNTER — Ambulatory Visit: Payer: Medicare Other | Admitting: *Deleted

## 2012-08-07 ENCOUNTER — Ambulatory Visit (INDEPENDENT_AMBULATORY_CARE_PROVIDER_SITE_OTHER): Payer: Medicare Other | Admitting: *Deleted

## 2012-08-07 ENCOUNTER — Ambulatory Visit: Payer: Medicare Other | Admitting: Physical Therapy

## 2012-08-07 VITALS — BP 128/66 | HR 63 | Resp 18 | Ht 63.0 in | Wt 128.8 lb

## 2012-08-07 DIAGNOSIS — I471 Supraventricular tachycardia: Secondary | ICD-10-CM

## 2012-08-07 DIAGNOSIS — I498 Other specified cardiac arrhythmias: Secondary | ICD-10-CM

## 2012-08-08 ENCOUNTER — Ambulatory Visit (HOSPITAL_COMMUNITY): Admission: RE | Admit: 2012-08-08 | Payer: Medicare Other | Source: Ambulatory Visit | Admitting: Internal Medicine

## 2012-08-08 ENCOUNTER — Encounter (HOSPITAL_COMMUNITY): Admission: RE | Payer: Self-pay | Source: Ambulatory Visit

## 2012-08-08 SURGERY — Surgical Case
Anesthesia: *Unknown

## 2012-08-08 SURGERY — CARDIOVERSION
Anesthesia: Monitor Anesthesia Care

## 2012-08-15 ENCOUNTER — Ambulatory Visit: Payer: Medicare Other | Admitting: Internal Medicine

## 2012-08-20 ENCOUNTER — Ambulatory Visit (INDEPENDENT_AMBULATORY_CARE_PROVIDER_SITE_OTHER): Payer: Medicare Other | Admitting: Internal Medicine

## 2012-08-20 ENCOUNTER — Encounter: Payer: Self-pay | Admitting: Internal Medicine

## 2012-08-20 VITALS — BP 128/64 | HR 68 | Ht 63.0 in | Wt 127.0 lb

## 2012-08-20 DIAGNOSIS — I4819 Other persistent atrial fibrillation: Secondary | ICD-10-CM

## 2012-08-20 DIAGNOSIS — I4891 Unspecified atrial fibrillation: Secondary | ICD-10-CM

## 2012-08-20 MED ORDER — AMIODARONE HCL 200 MG PO TABS: 400.0000 mg | ORAL_TABLET | Freq: Every day | ORAL | Status: DC

## 2012-08-20 MED ORDER — AMIODARONE HCL 200 MG PO TABS: 200.0000 mg | ORAL_TABLET | Freq: Every day | ORAL | Status: DC

## 2012-08-20 NOTE — Assessment & Plan Note (Signed)
The patient has symptomatic atrial fibrillation and has failed medical therapy with flecainide and rhythmol.  Her afib has progressed and is likely near persistent at this time.  She has been placed on amiodarone and is pleased with her experience with this medicine presently.  Therapeutic strategies for afib including medicine and ablation were discussed in detail with the patient today. Risk, benefits, and alternatives to EP study and radiofrequency ablation for afib were also discussed in detail today.  At this time, she would like to continue amiodarone and hold off on ablation.  She may reconsider if she has further afib down the road.   I will decrease amiodarone to 200mg  daily today.  She will follow-up with Dr Graciela Husbands in 6 weeks.  She will likely need LFTs and TFTs at that time. I will see her as needed.  She will contact my office should she decide to proceed with ablation.

## 2012-08-20 NOTE — Patient Instructions (Addendum)
Your physician recommends that you schedule a follow-up appointment in: 6 weeks with Dr Graciela Husbands  Call [phone] if you go back into afib   Your physician has recommended you make the following change in your medication:  1) Decrease Amiodarone to 200mg  daily

## 2012-08-20 NOTE — Progress Notes (Signed)
Primary Care Physician: Ezequiel Kayser, MD Referring Physician:  Dr Graciela Husbands Primary Cardiologist:  Dr [place]  Katheleen T Little is a 71 y.o. female with a h/o persistent atrial fibrillation who presents today for EP consideration for ablation.  She reports that she was initially diagnosed with atrial fibrillation September 2012 after presenting to [facility] with symptoms of palpitations and fatigue.    She was admitted to [facility] and converted overnight with IV diltiazem.  She reports having short episodes of atrial fibrillation thereafter.  She reports symptoms of palpitations, fatigue, and decreased exercise tolerance.  She was placed on flecainide.  In January 2013, she has syncope on flecainide and this medicine was discontinued.  She had recurrent afib with CVA in February 2013.  She was therefore placed on pradaxa but did not tolerate this medicine due to GI side effects.  She was therefore placed on Xarelto.  Her atrial fibrillation has increased in frequency and duration since that time.  She required cardioversion 3/13, 5/13, and most recently 8/13.  She was treated with rhythmol but continued to have afib.  Most recently, she was placed on amiodarone upon being evaluated by Dr Graciela Husbands 07/31/12.  She has maintained sinus rhythm since that time.  She reports having an episode of syncope in early October.  She had just gone from bed to bathroom in the early morning when she felt weak and presyncopal.   Her husband was able to catch her and drug her to a stool in the closet.  She regained consciousness quickly.  She has had no further episodes since that time. Today, she denies symptoms of palpitations, chest pain, shortness of breath, orthopnea, PND, lower extremity edema,  or neurologic sequela. The patient is tolerating medications without difficulties and is otherwise without complaint today.   Past Medical History  Diagnosis Date  . Polymyalgia rheumatica     a. On  chronic steroids  . Hypertension   . GERD (gastroesophageal reflux disease)     pt denies symptoms with this  . Migraine headache   . Cervical disc disease   . Osteoporosis   . Glaucoma(365)   . Osteoarthritis   . Persistent atrial fibrillation     a.  Diagnosed 07/2011;  b. Normal nuclear myoview 07/2011 and normal EF at that time;  c. Most recent echo 2/20 /13 - EF 55-60% wit mild-mod MR.;  d. s/p DCCV 01/12/2012  . Hyperlipidemia     a. Diagnosed 12/2011  . CVA (cerebral infarction)     a. 11/2011  . Diverticulosis   . Internal hemorrhoid   . Tubular adenoma of colon 04/2007  . Adrenal insufficiency     takes prednisone 5mg  daily   Past Surgical History  Procedure Date  . Breast lumpectomy 1980's    right  x2 (benign)  . Tonsillectomy age 43  . Cardioversion 01/12/2012    Procedure: CARDIOVERSION;  Surgeon: Lewayne Bunting, MD;  Location: [facility] OR;  Service: Cardiovascular;  Laterality: N/A;  . Cardioversion 03/28/2012    Procedure: CARDIOVERSION;  Surgeon: Laurey Morale, MD;  Location: [facility] OR;  Service: Cardiovascular;  Laterality: N/A;  . Cardioversion 06/29/2012    Procedure: CARDIOVERSION;  Surgeon: Cassell Clement, MD;  Location: [facility] ENDOSCOPY;  Service: Cardiovascular;  Laterality: N/A;    Current Outpatient Prescriptions  Medication Sig Dispense Refill  . alendronate (FOSAMAX) 70 MG tablet Take 70 mg by mouth every 7 (seven) days. Take with a full glass of  water on an empty stomach. Take on Wednesday      . amiodarone (PACERONE) 200 MG tablet Take 2 tablets (400 mg total) by mouth 2 (two) times daily.  180 tablet  3  . atenolol (TENORMIN) 50 MG tablet Take 50 mg by mouth daily.       . Calcium Carbonate-Vitamin D (CALCIUM + D) 600-200 MG-UNIT TABS Take 1 tablet by mouth daily.       . Coenzyme Q10 (CO Q 10 PO) Take 1 tablet by mouth daily.      Marland Kitchen diltiazem (DILACOR XR) 240 MG 24 hr capsule Take 1 capsule (240 mg total) by mouth daily.  30 capsule  11  . fish oil-omega-3 fatty  acids 1000 MG capsule Take 1 g by mouth 2 (two) times daily.       Marland Kitchen gabapentin (NEURONTIN) 100 MG capsule Take 100-200 mg by mouth 2 (two) times daily. Take 100mg  at noon daily and take 200mg  every night at bedtime      . pantoprazole (PROTONIX) 40 MG tablet Take 40 mg by mouth daily.       . potassium chloride (KLOR-CON M10) 10 MEQ tablet Take two tablets by mouth once daily  60 tablet  11  . predniSONE (DELTASONE) 5 MG tablet Take 5 mg by mouth daily.       . Rivaroxaban (XARELTO) 20 MG TABS Take 1 tablet (20 mg total) by mouth at bedtime.  30 tablet  6  . Sennosides (SENNA LAX PO) Take 1 tablet by mouth at bedtime as needed. For constipation      . temazepam (RESTORIL) 15 MG capsule Take 15 mg by mouth at bedtime.       . triamterene-hydrochlorothiazide (MAXZIDE-25) 37.5-25 MG per tablet Take 0.5 tablets by mouth daily.      . Vitamin D, Ergocalciferol, (DRISDOL) 50000 UNITS CAPS Take 50,000 Units by mouth every 14 (fourteen) days.      Marland Kitchen DISCONTD: flecainide (TAMBOCOR) 100 MG tablet Take 100mg  1 tablet prn onset of atrial fib.  30 tablet  6    No Known Allergies  History   Social History  . Marital Status: Married    Spouse Name: N/A    Number of Children: 3  . Years of Education: N/A   Occupational History  . homemaker    Social History Main Topics  . Smoking status: Never Smoker   . Smokeless tobacco: Never Used  . Alcohol Use: Yes     rare  . Drug Use: No  . Sexually Active: Not on file   Other Topics Concern  . Not on file   Social History Narrative   Pt lives in [place] with spouse.  She was previously a Engineer, site.    Family History  Problem Relation Age of Onset  . Stroke Mother     Deceased @ 62  . Liver cancer Father     Deceased @ 26  . Colon cancer Paternal Grandmother   . Heart failure Mother     died from    ROS- All systems are reviewed and negative except as per the HPI above  Physical Exam: Filed Vitals:   08/20/12 1127  BP: 128/64    Pulse: 68  Height: 5\' 3"  (1.6 m)  Weight: 127 lb (57.607 kg)  SpO2: 99%    GEN- The patient is well appearing, alert and oriented x 3 today.   Head- normocephalic, atraumatic Eyes-  Sclera clear, conjunctiva pink Ears- hearing intact Oropharynx-  clear Neck- supple, no JVP Lymph- no cervical lymphadenopathy Lungs- Clear to ausculation bilaterally, normal work of breathing Heart- Regular rate and rhythm, no murmurs, rubs or gallops, PMI not laterally displaced GI- soft, NT, ND, + BS Extremities- no clubbing, cyanosis, or edema MS- no significant deformity or atrophy Skin- no rash or lesion Psych- euthymic mood, full affect Neuro- strength and sensation are intact  EKG today reveals sinus rhythm 68 bpm, PR 202, QTS 70, Qtc 459, nonspecific ST/ T changes  Assessment and Plan:

## 2012-08-29 ENCOUNTER — Telehealth: Payer: Self-pay | Admitting: *Deleted

## 2012-08-29 NOTE — Telephone Encounter (Signed)
Pt called nurses desk, nurse not available. Pt wanted Dr Graciela Husbands to know that she has a UTI and does this have anything to do with her current meds. Pt told that i will forward to Dr/nurse,

## 2012-09-25 ENCOUNTER — Encounter: Payer: Self-pay | Admitting: Internal Medicine

## 2012-10-03 ENCOUNTER — Ambulatory Visit (INDEPENDENT_AMBULATORY_CARE_PROVIDER_SITE_OTHER): Payer: Medicare Other | Admitting: Internal Medicine

## 2012-10-03 ENCOUNTER — Encounter: Payer: Self-pay | Admitting: Internal Medicine

## 2012-10-03 VITALS — BP 122/62 | HR 59 | Resp 18 | Ht 63.0 in | Wt 127.0 lb

## 2012-10-03 DIAGNOSIS — I10 Essential (primary) hypertension: Secondary | ICD-10-CM

## 2012-10-03 DIAGNOSIS — I4891 Unspecified atrial fibrillation: Secondary | ICD-10-CM

## 2012-10-03 DIAGNOSIS — E785 Hyperlipidemia, unspecified: Secondary | ICD-10-CM

## 2012-10-03 NOTE — Assessment & Plan Note (Signed)
The patient was recently restarted on cholesterol-lowering therapy which he has not tolerated before. I reviewed with her for about 15 minutes the lack of outcome data associated with ezitamide and hold her that it was my bias, although clearly not a national consensus, that in the absence of outcomes data that I would not spend money for drug   As we try to sort out her sluggishness and fatigue, and the fact that she had muscle problems with daily Lipitor in the past, I've asked her to reduce her statin therapy to 3-40s week to make sure she will be able to tolerate it.

## 2012-10-03 NOTE — Progress Notes (Signed)
Patient Care Team: Ezequiel Kayser, MD as PCP - General (Internal Medicine)   HPI  Lorraine Little is a 71 y.o. female een in followup for atrial fibrillation. She underwent cardioversion may 2013 while on Rythmol previously having had syncope while on flecainide. She was cardioverted again  Early September and has reverted again 2 atrial fibrillation She has a history of CVA. She is on Rivaroxaban  Cardiac evaluation has included a normal Myoview echo with left atrial dimension of 33   She continues to have symptoms with her atrial fibrillation. He is in Dr. Johney Frame to consider catheter ablation. She elected at that time (10/13) to continue medical therapy which included amiodarone.  Her atrial fibrillation has been less frequent but occasionally occurs in the morning and seems to terminate after she takes her morning dose.  She also complains of fatigue and sluggishness.  She's recently been started back on statin therapy; it had been discontinued years ago because of muscle aches. For that she also has PMR and is on steroid       Past Medical History  Diagnosis Date  . Polymyalgia rheumatica     a. On chronic steroids  . Hypertension   . GERD (gastroesophageal reflux disease)     pt denies symptoms with this  . Migraine headache   . Cervical disc disease   . Osteoporosis   . Glaucoma(365)   . Osteoarthritis   . Persistent atrial fibrillation     a.  Diagnosed 07/2011;  b. Normal nuclear myoview 07/2011 and normal EF at that time;  c. Most recent echo 2/20 /13 - EF 55-60% wit mild-mod MR.;  d. s/p DCCV 01/12/2012  . Hyperlipidemia     a. Diagnosed 12/2011  . CVA (cerebral infarction)     a. 11/2011  . Diverticulosis   . Internal hemorrhoid   . Tubular adenoma of colon 04/2007  . Adrenal insufficiency     takes prednisone 5mg  daily    Past Surgical History  Procedure Date  . Breast lumpectomy 1980's    right  x2 (benign)  . Tonsillectomy age 74  . Cardioversion 01/12/2012   Procedure: CARDIOVERSION;  Surgeon: Lewayne Bunting, MD;  Location: [facility] OR;  Service: Cardiovascular;  Laterality: N/A;  . Cardioversion 03/28/2012    Procedure: CARDIOVERSION;  Surgeon: Laurey Morale, MD;  Location: [facility] OR;  Service: Cardiovascular;  Laterality: N/A;  . Cardioversion 06/29/2012    Procedure: CARDIOVERSION;  Surgeon: Cassell Clement, MD;  Location: [facility] ENDOSCOPY;  Service: Cardiovascular;  Laterality: N/A;    Current Outpatient Prescriptions  Medication Sig Dispense Refill  . alendronate (FOSAMAX) 70 MG tablet Take 70 mg by mouth every 7 (seven) days. Take with a full glass of water on an empty stomach. Take on Wednesday      . amiodarone (PACERONE) 200 MG tablet Take 1 tablet (200 mg total) by mouth daily.  180 tablet  3  . atenolol (TENORMIN) 50 MG tablet Take 50 mg by mouth daily.       . Calcium Carbonate-Vitamin D (CALCIUM + D) 600-200 MG-UNIT TABS Take 1 tablet by mouth daily.       . Coenzyme Q10 (CO Q 10 PO) Take 1 tablet by mouth daily.      Marland Kitchen diltiazem (DILACOR XR) 240 MG 24 hr capsule Take 1 capsule (240 mg total) by mouth daily.  30 capsule  11  . Ezetimibe-Atorvastatin (LIPTRUZET) 10-10 MG TABS Take 1 tablet by mouth daily.      Marland Kitchen  fish oil-omega-3 fatty acids 1000 MG capsule Take 1 g by mouth 2 (two) times daily.       Marland Kitchen gabapentin (NEURONTIN) 100 MG capsule Take 100-200 mg by mouth 2 (two) times daily. Take 100mg  at noon daily and take 200mg  every night at bedtime      . pantoprazole (PROTONIX) 40 MG tablet Take 40 mg by mouth daily.       . potassium chloride (KLOR-CON M10) 10 MEQ tablet Take two tablets by mouth once daily  60 tablet  11  . predniSONE (DELTASONE) 5 MG tablet Take 5 mg by mouth daily.       . Rivaroxaban (XARELTO) 20 MG TABS Take 1 tablet (20 mg total) by mouth at bedtime.  30 tablet  6  . Sennosides (SENNA LAX PO) Take 1 tablet by mouth at bedtime as needed. For constipation      . temazepam (RESTORIL) 15 MG capsule Take 15 mg by mouth at  bedtime.       . triamterene-hydrochlorothiazide (MAXZIDE-25) 37.5-25 MG per tablet Take 0.5 tablets by mouth daily.      . Vitamin D, Ergocalciferol, (DRISDOL) 50000 UNITS CAPS Take 50,000 Units by mouth every 14 (fourteen) days.      . [DISCONTINUED] flecainide (TAMBOCOR) 100 MG tablet Take 100mg  1 tablet prn onset of atrial fib.  30 tablet  6    No Known Allergies  Review of Systems negative except from HPI and PMH  Physical Exam BP 122/62  Pulse 59  Resp 18  Ht 5\' 3"  (1.6 m)  Wt 127 lb (57.607 kg)  BMI 22.50 kg/m2  SpO2 92% Well developed and nourished in no acute distress HENT normal Neck supple with JVP-flat Carotids brisk and full without bruits Clear Irregularly irregular rate and rhythm with controlled ventricular response, no murmurs or gallops Abd-soft with active BS without hepatomegaly No Clubbing cyanosis edema Skin-warm and dry A & Oriented  Grossly normal sensory and motor function  electrocardiogram demonstrates sinus rhythm at 57 Intervals 22/07/44 Otherwise normal   Assessment and  Plan

## 2012-10-03 NOTE — Assessment & Plan Note (Signed)
Currently on amiodarone. She discussed catheter ablation. She was not quite ready to pull the trigger on that. We'll continue to follow this along. She is on lower dose of amiodarone and  Her heart rates and n her blood pressure a relatively low. Some of her sluggishness may be related to her heart rate or her blood pressure and so we will undertake a serial exclusion trial of her beta blocker and calcium blocker starting with the latter. She'll let us know 2 weeks how she feels.

## 2012-10-03 NOTE — Assessment & Plan Note (Signed)
As above.

## 2012-10-03 NOTE — Patient Instructions (Signed)
Take Amiodarone at night.  Hold diltiazem for 2 weeks and report to [place] your feelings.  Hold Liptruzet till further notice.  Your physician recommends that you schedule a follow-up appointment in: 10-12 weeks with Dr. Graciela Husbands.

## 2012-10-03 NOTE — Progress Notes (Deleted)
Patient Care Team: Ezequiel Kayser, MD as PCP - General (Internal Medicine)   HPI  Lorraine Little is a 71 y.o. female een in followup for atrial fibrillation. She underwent cardioversion may 2013 while on Rythmol previously having had syncope while on flecainide. She was cardioverted again  Early September and has reverted again 2 atrial fibrillation She has a history of CVA. She is on Rivaroxaban  Cardiac evaluation has included a normal Myoview echo with left atrial dimension of 33   She continues to have symptoms with her atrial fibrillation.       Past Medical History  Diagnosis Date  . Polymyalgia rheumatica     a. On chronic steroids  . Hypertension   . GERD (gastroesophageal reflux disease)   . Migraine headache   . Cervical disc disease   . Osteoporosis   . Glaucoma   . Osteoarthritis   . PAF (paroxysmal atrial fibrillation)     a.  Diagnosed 07/2011;  b. Normal nuclear myoview 07/2011 and normal EF at that time;  c. Most recent echo 2/20 /13 - EF 55-60% wit mild-mod MR.;  d. s/p DCCV 01/12/2012  . Hyperlipidemia     a. Diagnosed 12/2011  . CVA (cerebral infarction)     a. 11/2011  . Diverticulosis   . Internal hemorrhoid   . Tubular adenoma of colon 04/2007    Past Surgical History  Procedure Date  . Breast lumpectomy 1980's    right  x2  . Tonsillectomy age 60  . Cardioversion 01/12/2012    Procedure: CARDIOVERSION;  Surgeon: Lewayne Bunting, MD;  Location: [facility] OR;  Service: Cardiovascular;  Laterality: N/A;  . Cardioversion 03/28/2012    Procedure: CARDIOVERSION;  Surgeon: Laurey Morale, MD;  Location: [facility] OR;  Service: Cardiovascular;  Laterality: N/A;  . Cardioversion 06/29/2012    Procedure: CARDIOVERSION;  Surgeon: Cassell Clement, MD;  Location: [facility] ENDOSCOPY;  Service: Cardiovascular;  Laterality: N/A;    Current Outpatient Prescriptions  Medication Sig Dispense Refill  . alendronate (FOSAMAX) 70 MG tablet Take 70 mg by mouth every 7 (seven) days. Take  with a full glass of water on an empty stomach. Take on Wednesday      . atenolol (TENORMIN) 50 MG tablet Take 50 mg by mouth daily.       . Calcium Carbonate-Vitamin D (CALCIUM + D) 600-200 MG-UNIT TABS Take 1 tablet by mouth daily.       . Coenzyme Q10 (CO Q 10 PO) Take 1 tablet by mouth daily.      Marland Kitchen diltiazem (DILACOR XR) 240 MG 24 hr capsule Take 1 capsule (240 mg total) by mouth daily.  30 capsule  11  . fish oil-omega-3 fatty acids 1000 MG capsule Take 1 g by mouth 2 (two) times daily.       Marland Kitchen gabapentin (NEURONTIN) 100 MG capsule Take 100-200 mg by mouth 2 (two) times daily. Take 100mg  at noon daily and take 200mg  every night at bedtime      . pantoprazole (PROTONIX) 40 MG tablet Take 40 mg by mouth daily.       . potassium chloride (KLOR-CON M10) 10 MEQ tablet Take two tablets by mouth once daily  60 tablet  11  . predniSONE (DELTASONE) 5 MG tablet Take 5 mg by mouth daily.       . propafenone (RYTHMOL) 150 MG tablet Take 2 tablets (300 mg total) by mouth every 12 (twelve) hours.  240 tablet  11  .  Rivaroxaban (XARELTO) 20 MG TABS Take 1 tablet (20 mg total) by mouth at bedtime.  30 tablet  6  . Sennosides (SENNA LAX PO) Take 1 tablet by mouth at bedtime as needed. For constipation      . temazepam (RESTORIL) 15 MG capsule Take 15 mg by mouth at bedtime.       . triamterene-hydrochlorothiazide (MAXZIDE-25) 37.5-25 MG per tablet Take 0.5 tablets by mouth daily.      . Vitamin D, Ergocalciferol, (DRISDOL) 50000 UNITS CAPS Take 50,000 Units by mouth every 14 (fourteen) days.      Marland Kitchen DISCONTD: flecainide (TAMBOCOR) 100 MG tablet Take 100mg  1 tablet prn onset of atrial fib.  30 tablet  6    No Known Allergies  Review of Systems negative except from HPI and PMH  Physical Exam BP 127/71  Pulse 98  Ht 5\' 3"  (1.6 m)  Wt 127 lb 6.4 oz (57.788 kg)  BMI 22.57 kg/m2 Well developed and nourished in no acute distress HENT normal Neck supple with JVP-flat Carotids brisk and full without  bruits Clear Irregularly irregular rate and rhythm with controlled ventricular response, no murmurs or gallops Abd-soft with active BS without hepatomegaly No Clubbing cyanosis edema Skin-warm and dry A & Oriented  Grossly normal sensory and motor function  electrocardiogram demonstrates atrial fibrillation at 80 Intervals-/07/34 Axis LXVII Assessment and  Plan

## 2012-10-15 ENCOUNTER — Telehealth: Payer: Self-pay | Admitting: Internal Medicine

## 2012-10-15 NOTE — Telephone Encounter (Signed)
p called to report that recent meds changes are working out well.  Pt feels better, less sob and heart is staying in rhythm.

## 2012-10-15 NOTE — Telephone Encounter (Signed)
plz return call to pt at hm# to discuss medication. Pt had an episode of nausea and throwing up on Saturday. Plz return call to discuss

## 2012-11-24 ENCOUNTER — Other Ambulatory Visit: Payer: Self-pay | Admitting: Internal Medicine

## 2012-12-11 ENCOUNTER — Encounter: Payer: Self-pay | Admitting: Gastroenterology

## 2012-12-19 ENCOUNTER — Ambulatory Visit (INDEPENDENT_AMBULATORY_CARE_PROVIDER_SITE_OTHER): Payer: Medicare Other | Admitting: Internal Medicine

## 2012-12-19 ENCOUNTER — Encounter: Payer: Self-pay | Admitting: Internal Medicine

## 2012-12-19 VITALS — BP 130/70 | HR 64 | Ht 63.0 in | Wt 126.1 lb

## 2012-12-19 DIAGNOSIS — I4891 Unspecified atrial fibrillation: Secondary | ICD-10-CM

## 2012-12-19 DIAGNOSIS — I6350 Cerebral infarction due to unspecified occlusion or stenosis of unspecified cerebral artery: Secondary | ICD-10-CM

## 2012-12-19 DIAGNOSIS — I639 Cerebral infarction, unspecified: Secondary | ICD-10-CM

## 2012-12-19 DIAGNOSIS — R251 Tremor, unspecified: Secondary | ICD-10-CM

## 2012-12-19 DIAGNOSIS — R259 Unspecified abnormal involuntary movements: Secondary | ICD-10-CM

## 2012-12-19 DIAGNOSIS — I10 Essential (primary) hypertension: Secondary | ICD-10-CM

## 2012-12-19 LAB — HEPATIC FUNCTION PANEL
ALT: 19 U/L (ref 0–35)
AST: 23 U/L (ref 0–37)
Albumin: 3.8 g/dL (ref 3.5–5.2)

## 2012-12-19 MED ORDER — AMIODARONE HCL 200 MG PO TABS: 200.0000 mg | ORAL_TABLET | Freq: Every day | ORAL | Status: DC

## 2012-12-19 MED ORDER — AMIODARONE HCL 100 MG PO TABS: 100.0000 mg | ORAL_TABLET | Freq: Every day | ORAL | Status: DC

## 2012-12-19 NOTE — Assessment & Plan Note (Signed)
This may be related to amiodarone neurotoxicity  . We will decrease the dose from 200-100.  In the event that her symptoms do not abate, she would like to reconsider catheter ablation so we'll tentatively set up an appointment for her to see Dr. Fawn Kirk  I am unaware of amiodarone for association with the headaches but my guess is it may well be related here as well

## 2012-12-19 NOTE — Patient Instructions (Addendum)
Your physician has recommended you make the following change in your medication:  1) Decrease amiodarone to 100 mg once daily.  Your physician recommends that you schedule a follow-up appointment in: 8 weeks with Dr. Graciela Husbands & 12 weeks with Dr. Johney Frame.  Your physician recommends that you have lab work today: tsh/liver function

## 2012-12-19 NOTE — Progress Notes (Signed)
Patient Care Team: Ezequiel Kayser, MD as PCP - General (Internal Medicine)   HPI  Lorraine Little is a 72 y.o. female seen in followup for atrial fibrillation. She underwent cardioversion may 2013 while on Rythmol previously having had syncope while on flecainide. She was cardioverted again Early September and has reverted again to atrial fibrillation    She has a history of CVA. She is on Rivaroxaban  Cardiac evaluation has included a normal Myoview echo with left atrial dimension of 33  She continues to have symptoms with her atrial fibrillation. She saw Dr. Johney Frame to consider catheter ablation. She elected at that time (10/13) to continue medical therapy which included amiodarone. Her atrial fibrillation has been less frequent but occasionally occurs in the morning and seems to terminate after she takes her morning dose.  She also complains of fatigue and sluggishness. Thought maybe this was related to her beta blockers and calcium blockers and we undertook an exclusion trial with which she felt better   However, she has now developed worsening headaches which have been chronic in the past and controlled by Neurontin and currently are not. She's also developed a tremor as well as back pains and also lower extremity paresthesias   Past Medical History  Diagnosis Date  . Polymyalgia rheumatica     a. On chronic steroids  . Hypertension   . GERD (gastroesophageal reflux disease)     pt denies symptoms with this  . Migraine headache   . Cervical disc disease   . Osteoporosis   . Glaucoma(365)   . Osteoarthritis   . Persistent atrial fibrillation     a.  Diagnosed 07/2011;  b. Normal nuclear myoview 07/2011 and normal EF at that time;  c. Most recent echo 2/20 /13 - EF 55-60% wit mild-mod MR.;  d. s/p DCCV 01/12/2012  . Hyperlipidemia     a. Diagnosed 12/2011  . CVA (cerebral infarction)     a. 11/2011  . Diverticulosis   . Internal hemorrhoid   . Tubular adenoma of colon 04/2007  .  Adrenal insufficiency     takes prednisone 5mg  daily    Past Surgical History  Procedure Laterality Date  . Breast lumpectomy  1980's    right  x2 (benign)  . Tonsillectomy  age 82  . Cardioversion  01/12/2012    Procedure: CARDIOVERSION;  Surgeon: Lewayne Bunting, MD;  Location: [facility] OR;  Service: Cardiovascular;  Laterality: N/A;  . Cardioversion  03/28/2012    Procedure: CARDIOVERSION;  Surgeon: Laurey Morale, MD;  Location: [facility] OR;  Service: Cardiovascular;  Laterality: N/A;  . Cardioversion  06/29/2012    Procedure: CARDIOVERSION;  Surgeon: Cassell Clement, MD;  Location: [facility] ENDOSCOPY;  Service: Cardiovascular;  Laterality: N/A;    Current Outpatient Prescriptions  Medication Sig Dispense Refill  . alendronate (FOSAMAX) 70 MG tablet Take 70 mg by mouth every 7 (seven) days. Take with a full glass of water on an empty stomach. Take on Wednesday      . amiodarone (PACERONE) 200 MG tablet Take 1 tablet (200 mg total) by mouth daily.  180 tablet  3  . atenolol (TENORMIN) 50 MG tablet Take 50 mg by mouth daily.       Marland Kitchen atorvastatin (LIPITOR) 10 MG tablet On hold      . Calcium Carbonate-Vitamin D (CALCIUM + D) 600-200 MG-UNIT TABS Take 1 tablet by mouth daily.       . Coenzyme Q10 (CO Q 10 PO) Take  1 tablet by mouth daily.      . fish oil-omega-3 fatty acids 1000 MG capsule Take 1 g by mouth 2 (two) times daily.       Marland Kitchen gabapentin (NEURONTIN) 100 MG capsule Take 100-200 mg by mouth 2 (two) times daily. Take 100mg  at noon daily and take 200mg  every night at bedtime      . pantoprazole (PROTONIX) 40 MG tablet Take 40 mg by mouth daily.       . potassium chloride (KLOR-CON M10) 10 MEQ tablet Take two tablets by mouth once daily  60 tablet  11  . predniSONE (DELTASONE) 5 MG tablet Take 5 mg by mouth daily.       . Sennosides (SENNA LAX PO) Take 1 tablet by mouth at bedtime as needed. For constipation      . temazepam (RESTORIL) 15 MG capsule Take 15 mg by mouth at bedtime.       .  triamterene-hydrochlorothiazide (MAXZIDE-25) 37.5-25 MG per tablet Take 0.5 tablets by mouth daily.      . Vitamin D, Ergocalciferol, (DRISDOL) 50000 UNITS CAPS Take 50,000 Units by mouth every 14 (fourteen) days.      [name] 20 MG TABS TAKE 1 TABLET (20 MG TOTAL) BY MOUTH AT BEDTIME.  30 tablet  6  . [DISCONTINUED] flecainide (TAMBOCOR) 100 MG tablet Take 100mg  1 tablet prn onset of atrial fib.  30 tablet  6   No current facility-administered medications for this visit.    No Known Allergies  Review of Systems negative except from HPI and PMH  Physical Exam BP 130/70  Pulse 64  Ht 5\' 3"  (1.6 m)  Wt 126 lb 1.9 oz (57.208 kg)  BMI 22.35 kg/m2 Well developed and well nourished in no acute distress HENT normal E scleral and icterus clear Neck Supple JVP flat; carotids brisk and full Clear to ausculation  *Regular rate and rhythm, diminished S1 and normal S2 no murmurs gallops or rub Soft with active bowel sounds No clubbing cyanosis none Edema Alert and oriented, grossly normal motor and sensory function Skin Warm and Dry  ECG demonstrates sinus before Intervals 21/07/43 axis is 75   Assessment and  Plan

## 2012-12-19 NOTE — Assessment & Plan Note (Signed)
She is on statin for secondary prevention

## 2012-12-19 NOTE — Assessment & Plan Note (Signed)
As above We will check thyroid and liver tests on amiodarone

## 2012-12-19 NOTE — Assessment & Plan Note (Signed)
Reasonably controlled at this time

## 2013-02-22 ENCOUNTER — Ambulatory Visit (INDEPENDENT_AMBULATORY_CARE_PROVIDER_SITE_OTHER): Payer: Medicare Other | Admitting: Internal Medicine

## 2013-02-22 ENCOUNTER — Encounter: Payer: Self-pay | Admitting: Internal Medicine

## 2013-02-22 VITALS — BP 144/76 | HR 58 | Ht 63.0 in | Wt 128.8 lb

## 2013-02-22 DIAGNOSIS — L659 Nonscarring hair loss, unspecified: Secondary | ICD-10-CM | POA: Insufficient documentation

## 2013-02-22 DIAGNOSIS — I498 Other specified cardiac arrhythmias: Secondary | ICD-10-CM

## 2013-02-22 DIAGNOSIS — R001 Bradycardia, unspecified: Secondary | ICD-10-CM | POA: Insufficient documentation

## 2013-02-22 DIAGNOSIS — K219 Gastro-esophageal reflux disease without esophagitis: Secondary | ICD-10-CM

## 2013-02-22 DIAGNOSIS — I4891 Unspecified atrial fibrillation: Secondary | ICD-10-CM

## 2013-02-22 NOTE — Assessment & Plan Note (Signed)
Holding sinus rhythm. The issue of side effects related to her medications is of concern. She has a tentative appointment to see Dr. Fawn Kirk  In May. If her side effect issues persist she will followup with him for consideration of ablation as an alternative to amiodarone.  She will continue on her anticoagulants

## 2013-02-22 NOTE — Assessment & Plan Note (Signed)
Improved. We'll continue her amiodarone for now

## 2013-02-22 NOTE — Patient Instructions (Addendum)
Your physician wants you to follow-up in: 6 months with Dr Graciela Husbands. You will receive a reminder letter in the mail two months in advance. If you don't receive a letter, please call our office to schedule the follow-up appointment.  Your physician has recommended you make the following change in your medication: STOP Atenolol

## 2013-02-22 NOTE — Assessment & Plan Note (Addendum)
She has dyspnea on exertion. She has sinus bradycardia. This may be contributing to his dyspnea. We'll stop her atenolol and she is resting bradycardia continuing her on her amiodarone. Hopefully this will improve sinus response to exercise and improve  dyspnea on exertion  His atypical chest pain with her dyspnea on exertion. I suspect that this is not coronary disease. She does have a rrecent Myoview that was nonischemic-2012

## 2013-02-22 NOTE — Assessment & Plan Note (Signed)
She continues to have episodes of nocturnal chest discomfort. I suspect that these are reflux.

## 2013-02-22 NOTE — Progress Notes (Signed)
Patient Care Team: Ezequiel Kayser, MD as PCP - General (Internal Medicine)   HPI  Lorraine Little is a 72 y.o. female Is seen in followup for atrial fibrillation. She has been treated with a variety of antiarrhythmics including flecainide temporally associated with syncope, propafenone and most recently amiodarone..  She has a history of a prior CVA. She is on Rivaroxaban  She felt her tremor which we thought might be related to amiodarone and decreased her dose was seen in February.  Since that time she continues to do well without clinical recurrences of atrial fibrillation. Her tremor is improved. Alopecia remains a problem  She also complains of dyspnea on exertion.  She has had episodes of chest discomfort described as a pressure. These occur mostly at night lasting 45-60 minutes. They're not associated brackish taste.  She has seen Dr. Johney Frame for consideration of catheter ablation. In October 2013 she elected to continue medical therapy.    Cardiac evaluation has included a normal Myoview; echo demonstrated left atrial dimension of 33  Past Medical History  Diagnosis Date  . Polymyalgia rheumatica     a. On chronic steroids  . Hypertension   . GERD (gastroesophageal reflux disease)     pt denies symptoms with this  . Migraine headache   . Cervical disc disease   . Osteoporosis   . Glaucoma(365)   . Osteoarthritis   . Persistent atrial fibrillation     a.  Diagnosed 07/2011;  b. Normal nuclear myoview 07/2011 and normal EF at that time;  c. Most recent echo 2/20 /13 - EF 55-60% wit mild-mod MR.;  d. s/p DCCV 01/12/2012  . Hyperlipidemia     a. Diagnosed 12/2011  . CVA (cerebral infarction)     a. 11/2011  . Diverticulosis   . Internal hemorrhoid   . Tubular adenoma of colon 04/2007  . Adrenal insufficiency     takes prednisone 5mg  daily    Past Surgical History  Procedure Laterality Date  . Breast lumpectomy  1980's    right  x2 (benign)  . Tonsillectomy  age 47  .  Cardioversion  01/12/2012    Procedure: CARDIOVERSION;  Surgeon: Lewayne Bunting, MD;  Location: [facility] OR;  Service: Cardiovascular;  Laterality: N/A;  . Cardioversion  03/28/2012    Procedure: CARDIOVERSION;  Surgeon: Laurey Morale, MD;  Location: [facility] OR;  Service: Cardiovascular;  Laterality: N/A;  . Cardioversion  06/29/2012    Procedure: CARDIOVERSION;  Surgeon: Cassell Clement, MD;  Location: [facility] ENDOSCOPY;  Service: Cardiovascular;  Laterality: N/A;    Current Outpatient Prescriptions  Medication Sig Dispense Refill  . alendronate (FOSAMAX) 70 MG tablet Take 70 mg by mouth every 7 (seven) days. Take with a full glass of water on an empty stomach. Take on Wednesday      . amiodarone (PACERONE) 100 MG tablet Take 1 tablet (100 mg total) by mouth daily.  30 tablet  6  . atenolol (TENORMIN) 50 MG tablet Take 50 mg by mouth daily.       Marland Kitchen atorvastatin (LIPITOR) 10 MG tablet On hold      . Calcium Carbonate-Vitamin D (CALCIUM + D) 600-200 MG-UNIT TABS Take 1 tablet by mouth daily.       . Coenzyme Q10 (CO Q 10 PO) Take 1 tablet by mouth daily.      . fish oil-omega-3 fatty acids 1000 MG capsule Take 1 g by mouth 2 (two) times daily.       Marland Kitchen  gabapentin (NEURONTIN) 100 MG capsule Take 100-200 mg by mouth 2 (two) times daily. Take 100mg  at noon daily and take 200mg  every night at bedtime      . pantoprazole (PROTONIX) 40 MG tablet Take 40 mg by mouth daily.       . potassium chloride (KLOR-CON M10) 10 MEQ tablet Take two tablets by mouth once daily  60 tablet  11  . predniSONE (DELTASONE) 5 MG tablet Take 5 mg by mouth daily.       . Sennosides (SENNA LAX PO) Take 1 tablet by mouth at bedtime as needed. For constipation      . temazepam (RESTORIL) 15 MG capsule Take 15 mg by mouth at bedtime.       . triamterene-hydrochlorothiazide (MAXZIDE-25) 37.5-25 MG per tablet Take 0.5 tablets by mouth daily.      . Vitamin D, Ergocalciferol, (DRISDOL) 50000 UNITS CAPS Take 50,000 Units by mouth every 14  (fourteen) days.      [name] 20 MG TABS TAKE 1 TABLET (20 MG TOTAL) BY MOUTH AT BEDTIME.  30 tablet  6  . [DISCONTINUED] flecainide (TAMBOCOR) 100 MG tablet Take 100mg  1 tablet prn onset of atrial fib.  30 tablet  6   No current facility-administered medications for this visit.    No Known Allergies  Review of Systems negative except from HPI and PMH  Physical Exam BP 144/76  Pulse 58  Ht 5\' 3"  (1.6 m)  Wt 128 lb 12.8 oz (58.423 kg)  BMI 22.82 kg/m2 Well developed and well nourished in no acute distress HENT normal E scleral and icterus clear Neck Supple JVP flat; carotids brisk and full Clear to ausculation  Regular rate and rhythm, no murmurs gallops or rub Soft with active bowel sounds No clubbing cyanosis none Edema Alert and oriented, grossly normal motor and sensory function Skin Warm and Dry  ECG demonstrates sinus rhythm at 55 Intervals 20/07/43  Assessment and  Plan

## 2013-02-22 NOTE — Assessment & Plan Note (Signed)
This can be attributed to the atenolol. We will stop it. This is a class effect of the drug. I do not know however, whether the beta blocker effects of amiodarone are sufficient to cause alopecia but as the mechanism.

## 2013-03-12 ENCOUNTER — Telehealth: Payer: Self-pay | Admitting: Internal Medicine

## 2013-03-12 NOTE — Telephone Encounter (Signed)
Left message for pt to call.

## 2013-03-12 NOTE — Telephone Encounter (Signed)
New problem    Pt stated she is doing fine and she is going to cancel her appt with Dr Johney Frame.

## 2013-03-12 NOTE — Telephone Encounter (Signed)
Follow-up:    Patient returned your call.  Please call back. 

## 2013-03-13 NOTE — Telephone Encounter (Signed)
Spoke with pt, at her last office visit her amiodarone was decreased to 100 mg. She is feeling much better and is doing what she wants. She is still holding normal rhythm therefore she is going to cancel the appt she had with dr allred on Friday. She will call back with problems.

## 2013-03-14 ENCOUNTER — Telehealth: Payer: Self-pay | Admitting: Physician Assistant

## 2013-03-14 NOTE — Telephone Encounter (Signed)
Pt called because she had gone out of rhythm today about 5:30 pm. She had previously been doing very well. No other problems or symptoms. No presyncope, dizziness, SOB or chest pain. Checked her BP and it was elevated but HR was 85. Pt is compliant with medications, including amiodarone at 200 mg, 1/2 tab daily. Was previously on atenolol 50 mg but that was d/c'd a while back. She still has some.  Advised pt to take a whole amiodarone and take atenolol 50 mg tab as well. Sent msg to office to call her in am. She had canceled appt with JA 5/16, but she will need to be seen, 5/16 if possible.

## 2013-03-15 ENCOUNTER — Ambulatory Visit: Payer: Medicare Other | Admitting: Internal Medicine

## 2013-03-15 ENCOUNTER — Telehealth: Payer: Self-pay | Admitting: Nurse Practitioner

## 2013-03-15 NOTE — Telephone Encounter (Signed)
Message copied by Levi Aland on Fri Mar 15, 2013 10:27 AM ------      Message from: Darrol Jump      Created: Thu Mar 14, 2013  6:49 PM       Ms Glab went back into afib 5/15 and needs to be seen. See phone note.             Thanks,       Oley Balm Barrett, PA-C      03/14/2013      6:58 PM      Beeper [phone]       ------

## 2013-03-15 NOTE — Telephone Encounter (Signed)
Spoke with patient who called Lorraine Demark, PA-C last night due to experiencing afib.  Patient was advised to take Amiodarone 200 mg and Atenolol 50 mg, which she did.  Patient reports that she is currently in sinus rhythm.  Patient states that she had an appointment for today with Dr. Johney Little but she cancelled it because she has been doing so well lately.  I spoke with Dr. Graciela Husbands to determine if patient needs to be seen or if she should make any medication changes.  Dr. Graciela Husbands advised that patient can take Atenolol 50 mg if she goes back into afib but should otherwise stay on regular medications as prescribed and that patient needs to schedule another appointment.  Patient verbalized understanding to take Atenolol when she experiences afib.  She has an appointment scheduled with Dr. Johney Little for 6/11 which is first available and she will call us if she experiences any additional episodes of afib.

## 2013-04-09 ENCOUNTER — Ambulatory Visit: Payer: Medicare Other | Admitting: Nurse Practitioner

## 2013-04-10 ENCOUNTER — Ambulatory Visit (INDEPENDENT_AMBULATORY_CARE_PROVIDER_SITE_OTHER): Payer: Medicare Other | Admitting: Internal Medicine

## 2013-04-10 ENCOUNTER — Encounter: Payer: Self-pay | Admitting: Internal Medicine

## 2013-04-10 VITALS — BP 151/86 | HR 81 | Ht 63.0 in | Wt 128.4 lb

## 2013-04-10 DIAGNOSIS — I4891 Unspecified atrial fibrillation: Secondary | ICD-10-CM

## 2013-04-10 NOTE — Patient Instructions (Signed)
Your physician recommends that you schedule a follow-up appointment in: 3 Months  Your physician recommends that you continue on your current medications as directed. Please refer to the Current Medication list given to you today.

## 2013-04-10 NOTE — Progress Notes (Signed)
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  Since last being seen in our clinic, the patient reports doing reasonably well.  She has had several episodes of afib recently but feels that these have increased with amiodarone and are short lived.  Today, she denies symptoms of chest pain, shortness of breath,  lower extremity edema, dizziness, presyncope, or syncope.  The patient is otherwise without complaint today.   Past Medical History  Diagnosis Date  . Polymyalgia rheumatica     a. On chronic steroids  . Hypertension   . GERD (gastroesophageal reflux disease)     pt denies symptoms with this  . Migraine headache   . Cervical disc disease   . Osteoporosis   . Glaucoma   . Osteoarthritis   . Persistent atrial fibrillation     a.  Diagnosed 07/2011;  b. Normal nuclear myoview 07/2011 and normal EF at that time;  c. Most recent echo 2/20 /13 - EF 55-60% wit mild-mod MR.;  d. s/p DCCV 01/12/2012  . Hyperlipidemia     a. Diagnosed 12/2011  . CVA (cerebral infarction)     a. 11/2011  . Diverticulosis   . Internal hemorrhoid   . Tubular adenoma of colon 04/2007  . Adrenal insufficiency     takes prednisone 5mg  daily   Past Surgical History  Procedure Laterality Date  . Breast lumpectomy  1980's    right  x2 (benign)  . Tonsillectomy  age 82  . Cardioversion  01/12/2012    Procedure: CARDIOVERSION;  Surgeon: Lewayne Bunting, MD;  Location: [facility] OR;  Service: Cardiovascular;  Laterality: N/A;  . Cardioversion  03/28/2012    Procedure: CARDIOVERSION;  Surgeon: Laurey Morale, MD;  Location: [facility] OR;  Service: Cardiovascular;  Laterality: N/A;  . Cardioversion  06/29/2012    Procedure: CARDIOVERSION;  Surgeon: Cassell Clement, MD;  Location: [facility] ENDOSCOPY;  Service: Cardiovascular;  Laterality: N/A;    Current Outpatient Prescriptions  Medication Sig Dispense Refill  . alendronate (FOSAMAX) 70 MG  tablet Take 70 mg by mouth every 7 (seven) days. Take with a full glass of water on an empty stomach. Take on Wednesday      . amiodarone (PACERONE) 100 MG tablet Take 1 tablet (100 mg total) by mouth daily.  30 tablet  6  . atorvastatin (LIPITOR) 10 MG tablet Take 10 mg by mouth daily.       . Calcium Carbonate-Vitamin D (CALCIUM + D) 600-200 MG-UNIT TABS Take 1 tablet by mouth daily.       . Coenzyme Q10 (CO Q 10 PO) Take 1 tablet by mouth daily.      . fish oil-omega-3 fatty acids 1000 MG capsule Take 1 g by mouth 2 (two) times daily.       Marland Kitchen gabapentin (NEURONTIN) 100 MG capsule Take 100-200 mg by mouth 2 (two) times daily. Take 100mg  at noon daily and take 200mg  every night at bedtime      . pantoprazole (PROTONIX) 40 MG tablet Take 40 mg by mouth daily.       . potassium chloride (KLOR-CON M10) 10 MEQ tablet Take two tablets by mouth once daily  60 tablet  11  . predniSONE (DELTASONE) 5 MG tablet Take 5 mg by mouth daily.       . Sennosides (SENNA LAX PO) Take 1 tablet by mouth at bedtime as needed. For constipation      .  temazepam (RESTORIL) 15 MG capsule Take 15 mg by mouth at bedtime.       . triamterene-hydrochlorothiazide (MAXZIDE-25) 37.5-25 MG per tablet Take 0.5 tablets by mouth daily.      . Vitamin D, Ergocalciferol, (DRISDOL) 50000 UNITS CAPS Take 50,000 Units by mouth every 14 (fourteen) days.      [name] 20 MG TABS TAKE 1 TABLET (20 MG TOTAL) BY MOUTH AT BEDTIME.  30 tablet  6  . [DISCONTINUED] flecainide (TAMBOCOR) 100 MG tablet Take 100mg  1 tablet prn onset of atrial fib.  30 tablet  6   No current facility-administered medications for this visit.    Physical Exam: Filed Vitals:   04/10/13 1209  BP: 151/86  Pulse: 81  Height: 5\' 3"  (1.6 m)  Weight: 128 lb 6.4 oz (58.242 kg)    GEN- The patient is well appearing, alert and oriented x 3 today.   Head- normocephalic, atraumatic Eyes-  Sclera clear, conjunctiva pink Ears- hearing intact Oropharynx- clear Lungs-  Clear to ausculation bilaterally, normal work of breathing Heart- Regular rate and rhythm, no murmurs, rubs or gallops, PMI not laterally displaced GI- soft, NT, ND, + BS Extremities- no clubbing, cyanosis, or edema  ekg today reveals sinus rhythm nonspecific St/ T changes  Assessment and Plan:  1. afib The patient has symptomatic afib despite medical therapy with amiodarone.  I think that ablation is certainly a reasonable option. Therapeutic strategies for afib including medicine and ablation were discussed in detail with the patient today. Risk, benefits, and alternatives to EP study and radiofrequency ablation for afib were also discussed in detail today. These risks include but are not limited to stroke, bleeding, vascular damage, tamponade, perforation, damage to the esophagus, lungs, and other structures, pulmonary vein stenosis, worsening renal function, and death. The patient understands these risk and wishes to continue to defer ablation.  She states that her afib isnt "bad enough" at this time.  I will see her again in 3 months for further discussion.  If she continues to defer ablation at that point, then I will return her care back to Dr Graciela Husbands.

## 2013-05-30 ENCOUNTER — Telehealth: Payer: Self-pay | Admitting: Internal Medicine

## 2013-05-30 NOTE — Telephone Encounter (Signed)
Patient in afib now feeling dizzy when she gets up to move around.  Episode started  at 9:30pm last night. She called around 12:15am and the answering service said she would get a call back and no one ever called her.  So she called the office this morning.  This is first episode on Amiodarone HR 122 now BP 115/67.  She has taken Atenolol in the past for episodes of afib.  After talking with her more this was her husbands medication  I have discussed the above with Dr Johney Frame and he recommends for the patient to take 200mg  of Amiodarone daily for 1 week in hopes this will convert her.  I will call her this afternoon to see how she is feeling.  She agrees with plan. I also let her know that I would have someone look into the fact she never got a call back

## 2013-05-30 NOTE — Telephone Encounter (Signed)
Spoke with patient.  She is back in rhythm and she feels much better  At 4pm her BP 99/53 HR is 70's.  She will call me back if she needs me

## 2013-05-30 NOTE — Telephone Encounter (Signed)
Follow up  Pt returning call regarding her afib

## 2013-05-30 NOTE — Telephone Encounter (Signed)
New problem   Per pt in afib and wants to know what to do

## 2013-06-05 ENCOUNTER — Other Ambulatory Visit: Payer: Self-pay

## 2013-06-08 ENCOUNTER — Other Ambulatory Visit: Payer: Self-pay | Admitting: Internal Medicine

## 2013-06-13 ENCOUNTER — Ambulatory Visit (INDEPENDENT_AMBULATORY_CARE_PROVIDER_SITE_OTHER): Payer: Medicare Other | Admitting: Nurse Practitioner

## 2013-06-13 ENCOUNTER — Encounter: Payer: Self-pay | Admitting: Nurse Practitioner

## 2013-06-13 ENCOUNTER — Telehealth: Payer: Self-pay | Admitting: Internal Medicine

## 2013-06-13 VITALS — BP 145/78 | HR 81 | Ht 62.0 in | Wt 124.0 lb

## 2013-06-13 DIAGNOSIS — R209 Unspecified disturbances of skin sensation: Secondary | ICD-10-CM | POA: Insufficient documentation

## 2013-06-13 DIAGNOSIS — I69959 Hemiplegia and hemiparesis following unspecified cerebrovascular disease affecting unspecified side: Secondary | ICD-10-CM | POA: Insufficient documentation

## 2013-06-13 DIAGNOSIS — I6340 Cerebral infarction due to embolism of unspecified cerebral artery: Secondary | ICD-10-CM | POA: Insufficient documentation

## 2013-06-13 MED ORDER — GABAPENTIN 100 MG PO CAPS: 100.0000 mg | ORAL_CAPSULE | Freq: Three times a day (TID) | ORAL | Status: DC

## 2013-06-13 NOTE — Progress Notes (Signed)
Reason for visit CVA follow up HPI: Lorraine Little, 72 year old white female returns today for followup   Dr Lorraine Little is PCP.  She was last seen 10/05/2012   She developed migraines many years ago and followed Dr. Meryl Little. Meanwhile, the medical history has changed and her headaches that are today's chief complaint are different. Her dentist ruled out TMJ , and her rheumatologist felt her complains not to be related to GCA. She underwent a MRI with Dr Lorraine Little, showing only small vessel disease and  no strokes, basal ganglia lacunes.  When last seen had  the  feeling of movement in the temples- or their muscles has been steady- she does not call this a headache , went through with an extensive ophtalomological exam and found to have no Glaucoma exacerbation.  of pressure in the temple area on both sides of the head, radiating to the back and  the occipital area. Retrorbital pressure , too   Her skin feels creepy, crawly and there is a sensation as if her skin is pulled up to a point.  There is no pulsatile character and no  nausea.  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 She carries the diagnosis of myalgia rheumatica for 11 years and she was diagnosed with a DDD of the cervical spine.  She reports some partial relief with  Excedrin Migraine, not from ibuprofen nor Aleve.  Her neck spasms respond best to biofreeze, applied topically. She was placed on Imipramine with great reduction in headaches,but that has been stopped and she is now on Gabapentin.  Pt had a bubble study, emboli monitoring and PVD ultrasound  and Temporal artery doppler that were negative.  She is currently on Gabapentin 400mg  daily,(total dose) tolerating without S/E and relief of symptoms.She occasionally has a headache late in the day that begins in the occlipital area. Sometimes her headaches are in the frontal area. No  real change in the character of headaches.  Established patient here - suffered a stroke due to atrial  fib in Feb 2013 she had 3 cardioversions and is followed by Lorraine Little.  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   06/13/13 Returns for followup. 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 She has cut back on her dose of  Gabapentin.Her at fib is currently controlled but she continues to have med adjustments and she is considering ablation.  She denies further stroke symptoms. She is going to water aerobics 3 times a week.    ROS: Fatigue easy bruising, insomnia, headache    Medications Current Outpatient Prescriptions on File Prior to Visit  Medication Sig Dispense Refill  . alendronate (FOSAMAX) 70 MG tablet Take 70 mg by mouth every 7 (seven) days. Take with a full glass of water on an empty stomach. Take on Wednesday      . amiodarone (PACERONE) 100 MG tablet Take 1 tablet (100 mg total) by mouth daily.  30 tablet  6  . atorvastatin (LIPITOR) 10 MG tablet Take 10 mg by mouth daily.       . Calcium Carbonate-Vitamin D (CALCIUM + D) 600-200 MG-UNIT TABS Take 1 tablet by mouth daily.       . Coenzyme Q10 (CO Q 10 PO) Take 1 tablet by mouth daily.      . fish oil-omega-3  fatty acids 1000 MG capsule Take 1 g by mouth 2 (two) times daily.       Marland Kitchen gabapentin (NEURONTIN) 100 MG capsule Take 100-200 mg by mouth 2 (two) times daily. Take 100mg  at noon daily and take 200mg  every night at bedtime      . pantoprazole (PROTONIX) 40 MG tablet Take 40 mg by mouth daily.       . potassium chloride (KLOR-CON M10) 10 MEQ tablet Take two tablets by mouth once daily  60 tablet  11  . predniSONE (DELTASONE) 5 MG tablet Take 5 mg by mouth daily.       . Sennosides (SENNA LAX PO) Take 1 tablet by mouth at bedtime as needed. For constipation      . temazepam (RESTORIL) 15 MG capsule Take 15 mg by mouth at bedtime.       .  triamterene-hydrochlorothiazide (MAXZIDE-25) 37.5-25 MG per tablet Take 0.5 tablets by mouth daily.      . Vitamin D, Ergocalciferol, (DRISDOL) 50000 UNITS CAPS Take 50,000 Units by mouth every 14 (fourteen) days.      Lorraine Little 20 MG TABS tablet TAKE 1 TABLET (20 MG TOTAL) BY MOUTH AT BEDTIME.  30 tablet  3  . [DISCONTINUED] flecainide (TAMBOCOR) 100 MG tablet Take 100mg  1 tablet prn onset of atrial fib.  30 tablet  6   No current facility-administered medications on file prior to visit.    Allergies No Known Allergies  Physical Exam General: well developed, well nourished, seated, in no evident distress Head: head normocephalic and atraumatic. Oropharynx benign Neck: supple with no carotid  bruits Cardiovascular: regular rate and rhythm, no murmurs  Neurologic Exam Mental Status: Awake and fully alert. Oriented to place and time. Follows all commands. Speech and language normal.   Cranial Nerves: Pupils equal, briskly reactive to light. Extraocular movements full without nystagmus. Visual fields full to confrontation. Hearing intact and symmetric to finger snap. Facial sensation intact. Face, tongue, palate move normally and symmetrically. Neck flexion and extension normal.  Motor: Normal bulk and tone. Normal strength in all tested extremity muscles.No focal weakness Sensory.: intact to touch and pinprick and vibratory.  Coordination: Rapid alternating movements normal in all extremities. Finger-to-nose and heel-to-shin performed accurately bilaterally. No dysmetria  Gait and Station: Arises from chair without difficulty. Stance is normal. Gait demonstrates normal stride length and balance . Able to heel, toe and unsteady with tandem walk.  Reflexes: 2+ and symmetric. Toes downgoing.     ASSESSMENT: History of stroke in February 2013 atrial fibrillation has had unsuccessful cardioversion for the past. Patient is considering ablation     PLAN: Continue  gabapentin at current dose  will renew Continue Xarelto Followup in 6 months to one year  Lorraine Little, [facility] APRN

## 2013-06-13 NOTE — Progress Notes (Signed)
i agree with assessment and plan, Fartun Paradiso, MD

## 2013-06-13 NOTE — Telephone Encounter (Signed)
New Prob  Pt wants to speak with you regarding the new medication that she was put on.

## 2013-06-13 NOTE — Telephone Encounter (Addendum)
She was to increase her Amiodarone for one week, then go back to her original dose of 100mg  daily.  She will keep her follow up appointment in 07/2013

## 2013-06-13 NOTE — Patient Instructions (Addendum)
Continue  gabapentin at current dose will renew Continue Xarelto Followup in 6 months to one year

## 2013-06-26 ENCOUNTER — Other Ambulatory Visit: Payer: Self-pay | Admitting: Internal Medicine

## 2013-06-27 ENCOUNTER — Other Ambulatory Visit: Payer: Self-pay | Admitting: Internal Medicine

## 2013-07-04 ENCOUNTER — Telehealth: Payer: Self-pay | Admitting: Internal Medicine

## 2013-07-04 NOTE — Telephone Encounter (Addendum)
8/14 and 8/31 and Mon 9/1 ins and out all day Tues good Wed in and out all day.  After talking with her more it sounds as though she may be having PAC's or PVC's.  She has increased her Amiodarone to 200mg  daily or several days and it helped some then she would back down to 100mg .  I let her know I would discuss with Dr Johney Frame this afternoon and call her back.  It would be nice to try and catch what she is doing on monitor or EKG if possible.  It does not last long.  This is why I don't think it is afib

## 2013-07-04 NOTE — Telephone Encounter (Signed)
New Prob  Pt states she is out of rhythm and she wants to know what medicine to take.

## 2013-07-05 NOTE — Telephone Encounter (Signed)
She is still having the irregular heartbeats and is going to take the extra Amiodarone 200mg  over the weekend and see if she can get it to quite down.  She will call me back Mon morning and see if she needs to come in for an EKG

## 2013-07-08 NOTE — Telephone Encounter (Signed)
Called patient back and spoke with her this morning.  She is in rhythm now on 200mg  daily since Sat.  She has a follow up appointment on 07/22/13.

## 2013-07-08 NOTE — Telephone Encounter (Signed)
Follow up      Patient calling back this am to speak with nurse.  Feeling okay .

## 2013-07-08 NOTE — Telephone Encounter (Signed)
Discussed with Dr Johney Frame, continue on Amiodarone 200mg  daily until follow up appointment  Patient aware

## 2013-07-12 ENCOUNTER — Ambulatory Visit: Payer: Medicare Other | Admitting: Internal Medicine

## 2013-07-12 ENCOUNTER — Ambulatory Visit: Payer: Medicare Other | Admitting: Cardiology

## 2013-07-22 ENCOUNTER — Ambulatory Visit (INDEPENDENT_AMBULATORY_CARE_PROVIDER_SITE_OTHER): Payer: Medicare Other | Admitting: Internal Medicine

## 2013-07-22 ENCOUNTER — Encounter: Payer: Self-pay | Admitting: Internal Medicine

## 2013-07-22 VITALS — BP 147/82 | HR 74 | Ht 62.0 in | Wt 122.0 lb

## 2013-07-22 DIAGNOSIS — I10 Essential (primary) hypertension: Secondary | ICD-10-CM

## 2013-07-22 DIAGNOSIS — I4891 Unspecified atrial fibrillation: Secondary | ICD-10-CM

## 2013-07-22 LAB — CBC WITH DIFFERENTIAL/PLATELET
Basophils Absolute: 0 10*3/uL (ref 0.0–0.1)
Basophils Relative: 0.5 % (ref 0.0–3.0)
Eosinophils Relative: 0.6 % (ref 0.0–5.0)
HCT: 39.9 % (ref 36.0–46.0)
Hemoglobin: 13.3 g/dL (ref 12.0–15.0)
Lymphocytes Relative: 13.7 % (ref 12.0–46.0)
Monocytes Relative: 10.5 % (ref 3.0–12.0)
Neutro Abs: 5 10*3/uL (ref 1.4–7.7)
RBC: 4.48 Mil/uL (ref 3.87–5.11)
WBC: 6.6 10*3/uL (ref 4.5–10.5)

## 2013-07-22 LAB — BASIC METABOLIC PANEL
Calcium: 9.5 mg/dL (ref 8.4–10.5)
GFR: 45.33 mL/min — ABNORMAL LOW (ref >60.00)
Potassium: 4.4 mEq/L (ref 3.5–5.1)
Sodium: 139 mEq/L (ref 135–145)

## 2013-07-22 LAB — HEPATIC FUNCTION PANEL
ALT: 15 U/L (ref 0–35)
AST: 23 U/L (ref 0–37)
Albumin: 4.1 g/dL (ref 3.5–5.2)
Alkaline Phosphatase: 43 U/L (ref 39–117)
Total Protein: 6.9 g/dL (ref 6.0–8.3)

## 2013-07-22 NOTE — Patient Instructions (Addendum)
Your physician wants you to follow-up in: 6 months with Dr. Klein. You will receive a reminder letter in the mail two months in advance. If you don't receive a letter, please call our office to schedule the follow-up appointment.  

## 2013-07-22 NOTE — Progress Notes (Signed)
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  Since last being seen in our clinic, the patient reports doing reasonably well.  She feels that her afib is well controlled with amiodarone presently.  Today, she denies symptoms of chest pain, shortness of breath,  lower extremity edema, dizziness, presyncope, or syncope.  The patient is otherwise without complaint today.   Past Medical History  Diagnosis Date  . Polymyalgia rheumatica     a. On chronic steroids  . Hypertension   . GERD (gastroesophageal reflux disease)     pt denies symptoms with this  . Migraine headache   . Cervical disc disease   . Osteoporosis   . Glaucoma   . Osteoarthritis   . Persistent atrial fibrillation     a.  Diagnosed 07/2011;  b. Normal nuclear myoview 07/2011 and normal EF at that time;  c. Most recent echo 2/20 /13 - EF 55-60% wit mild-mod MR.;  d. s/p DCCV 01/12/2012  . Hyperlipidemia     a. Diagnosed 12/2011  . CVA (cerebral infarction)     a. 11/2011  . Diverticulosis   . Internal hemorrhoid   . Tubular adenoma of colon 04/2007  . Adrenal insufficiency     takes prednisone 5mg  daily   Past Surgical History  Procedure Laterality Date  . Breast lumpectomy  1980's    right  x2 (benign)  . Tonsillectomy  age 29  . Cardioversion  01/12/2012    Procedure: CARDIOVERSION;  Surgeon: Lewayne Bunting, MD;  Location: [facility] OR;  Service: Cardiovascular;  Laterality: N/A;  . Cardioversion  03/28/2012    Procedure: CARDIOVERSION;  Surgeon: Laurey Morale, MD;  Location: [facility] OR;  Service: Cardiovascular;  Laterality: N/A;  . Cardioversion  06/29/2012    Procedure: CARDIOVERSION;  Surgeon: Cassell Clement, MD;  Location: [facility] ENDOSCOPY;  Service: Cardiovascular;  Laterality: N/A;    Current Outpatient Prescriptions  Medication Sig Dispense Refill  . alendronate (FOSAMAX) 70 MG tablet Take 70 mg by mouth every 7 (seven) days.  Take with a full glass of water on an empty stomach.      Marland Kitchen amiodarone (PACERONE) 200 MG tablet Take 200 mg by mouth daily.      Marland Kitchen atorvastatin (LIPITOR) 10 MG tablet Take 10 mg by mouth daily.       . Calcium Carbonate-Vitamin D (CALCIUM + D) 600-200 MG-UNIT TABS Take 1 tablet by mouth daily.       . Cholecalciferol (VITAMIN D-3) 5000 UNITS TABS Take by mouth. 1 tab daily      . Coenzyme Q10 (CO Q 10 PO) Take 1 tablet by mouth daily.      . fish oil-omega-3 fatty acids 1000 MG capsule Take 1 g by mouth 2 (two) times daily.       Marland Kitchen gabapentin (NEURONTIN) 100 MG capsule Take 1 capsule (100 mg total) by mouth 3 (three) times daily. 1 in the am, 1 mid afternoon and 2 hs  120 capsule  11  . pantoprazole (PROTONIX) 40 MG tablet Take 40 mg by mouth daily.       . potassium chloride (KLOR-CON M10) 10 MEQ tablet Take two tablets by mouth once daily  60 tablet  11  . predniSONE (DELTASONE) 5 MG tablet 1 tab daily      . Sennosides (SENNA LAX PO) Take 1 tablet by mouth at bedtime as needed. For constipation      .  temazepam (RESTORIL) 15 MG capsule Take 15 mg by mouth at bedtime.       . triamterene-hydrochlorothiazide (MAXZIDE-25) 37.5-25 MG per tablet Take 0.5 tablets by mouth daily.      Lorraine Little 20 MG TABS tablet TAKE 1 TABLET (20 MG TOTAL) BY MOUTH AT BEDTIME.  30 tablet  3  . [DISCONTINUED] flecainide (TAMBOCOR) 100 MG tablet Take 100mg  1 tablet prn onset of atrial fib.  30 tablet  6   No current facility-administered medications for this visit.    Physical Exam: Filed Vitals:   07/22/13 1222  BP: 147/82  Pulse: 74  Height: 5\' 2"  (1.575 m)  Weight: 122 lb (55.339 kg)    GEN- The patient is well appearing, alert and oriented x 3 today.   Head- normocephalic, atraumatic Eyes-  Sclera clear, conjunctiva pink Ears- hearing intact Oropharynx- clear Lungs- Clear to ausculation bilaterally, normal work of breathing Heart- Regular rate and rhythm, no murmurs, rubs or gallops, PMI not  laterally displaced GI- soft, NT, ND, + BS Extremities- no clubbing, cyanosis, or edema  ekg today reveals sinus rhythm74 bpm,  nonspecific St/ T changes  Assessment and Plan:  1. afib The patient has symptomatic afib despite medical therapy with amiodarone.  I think that ablation is certainly a reasonable option. Therapeutic strategies for afib including medicine and ablation were discussed in detail with the patient today.  As she is presently doing well with amiodarone, she would prefer to continue this strategy.  She is on xarelto for stroke prevention.  She will continue to follow with Dr Graciela Husbands.  Should she decide to reconsider ablation in the future, then I would be happy to see her again at that time.  2. HTN Stable No change required today   Return to see Dr Graciela Husbands in 6 months

## 2013-08-02 ENCOUNTER — Other Ambulatory Visit: Payer: Self-pay | Admitting: Cardiology

## 2013-08-08 ENCOUNTER — Encounter: Payer: Self-pay | Admitting: Gastroenterology

## 2013-08-12 ENCOUNTER — Other Ambulatory Visit: Payer: Self-pay | Admitting: Internal Medicine

## 2013-08-30 ENCOUNTER — Other Ambulatory Visit: Payer: Self-pay | Admitting: Cardiology

## 2013-09-05 ENCOUNTER — Other Ambulatory Visit: Payer: Self-pay

## 2013-09-27 ENCOUNTER — Encounter: Payer: Self-pay | Admitting: Cardiology

## 2013-10-14 ENCOUNTER — Encounter: Payer: Self-pay | Admitting: Gastroenterology

## 2013-10-14 ENCOUNTER — Telehealth: Payer: Self-pay | Admitting: Internal Medicine

## 2013-10-14 ENCOUNTER — Ambulatory Visit (INDEPENDENT_AMBULATORY_CARE_PROVIDER_SITE_OTHER): Payer: Medicare Other | Admitting: Gastroenterology

## 2013-10-14 VITALS — BP 140/80 | HR 76 | Ht 62.0 in | Wt 126.5 lb

## 2013-10-14 DIAGNOSIS — Z7901 Long term (current) use of anticoagulants: Secondary | ICD-10-CM

## 2013-10-14 DIAGNOSIS — I4891 Unspecified atrial fibrillation: Secondary | ICD-10-CM

## 2013-10-14 DIAGNOSIS — I6340 Cerebral infarction due to embolism of unspecified cerebral artery: Secondary | ICD-10-CM

## 2013-10-14 DIAGNOSIS — Z86010 Personal history of colonic polyps: Secondary | ICD-10-CM

## 2013-10-14 MED ORDER — PEG-KCL-NACL-NASULF-NA ASC-C 100 G PO SOLR: 1.0000 | Freq: Once | ORAL | Status: DC

## 2013-10-14 NOTE — Telephone Encounter (Signed)
Error

## 2013-10-14 NOTE — Patient Instructions (Addendum)
I will contact you after Dr. Odessa Fleming nurse returns my call regarding your Xarelto.  You have been scheduled for a colonoscopy with propofol. Please follow written instructions given to you at your visit today.  Please pick up your prep kit at the pharmacy within the next 1-3 days. If you use inhalers (even only as needed), please bring them with you on the day of your procedure.  Thank you for choosing me and [facility].  Venita Lick. Pleas Koch., MD., Clementeen Graham  cc: Rodrigo Ran, MD

## 2013-10-14 NOTE — Progress Notes (Signed)
    History of Present Illness: This is a 72 year old female with a history of one small adenomatous colon polyp on colonoscopy in June 2008. 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 She is followed closely by Drs. Graciela Husbands and Allred for management of atrial fibrillation. Ablation is being considered. I last evaluated her in June 2013 and we planned to defer colonoscopy for at least one year following her CVA. She has mild chronic constipation but no other gastrointestinal complaints. Denies weight loss, abdominal pain, diarrhea, change in stool caliber, melena, hematochezia, nausea, vomiting, dysphagia, reflux symptoms, chest pain.  Current Medications, Allergies, Past Medical History, Past Surgical History, Family History and Social History were reviewed in [employer] record.  Physical Exam: General: Well developed , well nourished, no acute distress Head: Normocephalic and atraumatic Eyes:  sclerae anicteric, EOMI Ears: Normal auditory acuity Mouth: No deformity or lesions Lungs: Clear throughout to auscultation Heart: Regular rate and rhythm; no murmurs, rubs or bruits Abdomen: Soft, non tender and non distended. No masses, hepatosplenomegaly or hernias noted. Normal Bowel sounds Rectal: deferred to colonoscopy Musculoskeletal: Symmetrical with no gross deformities  Pulses:  Normal pulses noted Extremities: No clubbing, cyanosis, edema or deformities noted Neurological: Alert oriented x 4, grossly nonfocal Psychological:  Alert and cooperative. Normal mood and affect  Assessment and Recommendations:  1. Personal history of a small adenomatous colon polyp. It has been almost 2 years since her CVA. I think it is reasonable to proceed with an elective surveillance colonoscopy in Jan or Feb. We discussed the possibility of Cologuard testing including  its risks and benefits. Cologuard was studied for screening only and not in situations involving a history of polyps or for polyp surveillance. The risks, benefits, and alternatives to colonoscopy with possible biopsy and possible polypectomy were discussed with the patient and she will consider proceeding after further input from her cardiologists.   2. Paroxysmal atrial fibrillation. She is very concerned about the defining the risks of a temporary hold of Xarelto. I will defer to Drs. Graciela Husbands and Allred to provide more information to the patient about the risks and to provide clearance to proceed with colonoscopy. I think her risk is very low with a 2-3 day hold of Xarelto.  3. Prior cerebrovascular accident, 12/2011.

## 2013-10-14 NOTE — Telephone Encounter (Signed)
New problem    Patient is asking the risk - having colonoscopy & come off xarelto prior to upcoming surgery.

## 2013-10-15 ENCOUNTER — Encounter: Payer: Self-pay | Admitting: Gastroenterology

## 2013-10-16 NOTE — Telephone Encounter (Signed)
Spoke with Marchelle Folks at GI office and explained that we will leave decision to them about stopping Xarelto. Per her request I called pt and discussed risks with her. Pt verbalized understanding

## 2013-10-18 ENCOUNTER — Ambulatory Visit (INDEPENDENT_AMBULATORY_CARE_PROVIDER_SITE_OTHER): Payer: Medicare Other | Admitting: Neurology

## 2013-10-18 ENCOUNTER — Encounter: Payer: Self-pay | Admitting: Neurology

## 2013-10-18 VITALS — BP 150/73 | HR 70 | Resp 16 | Ht 62.0 in | Wt 128.0 lb

## 2013-10-18 DIAGNOSIS — R0609 Other forms of dyspnea: Secondary | ICD-10-CM

## 2013-10-18 DIAGNOSIS — I4891 Unspecified atrial fibrillation: Secondary | ICD-10-CM

## 2013-10-18 DIAGNOSIS — I639 Cerebral infarction, unspecified: Secondary | ICD-10-CM | POA: Insufficient documentation

## 2013-10-18 DIAGNOSIS — R0683 Snoring: Secondary | ICD-10-CM | POA: Insufficient documentation

## 2013-10-18 DIAGNOSIS — F5102 Adjustment insomnia: Secondary | ICD-10-CM | POA: Insufficient documentation

## 2013-10-18 DIAGNOSIS — I6350 Cerebral infarction due to unspecified occlusion or stenosis of unspecified cerebral artery: Secondary | ICD-10-CM

## 2013-10-18 MED ORDER — DIPHENHYDRAMINE HCL (SLEEP) 50 MG PO CAPS: 50.0000 mg | ORAL_CAPSULE | Freq: Every evening | ORAL | Status: DC | PRN

## 2013-10-18 NOTE — Patient Instructions (Signed)
Insomnia Insomnia means you have trouble falling or staying asleep. It affects about one person in three at different times and is usually related to stress from work, school, or personal relations. Insomnia is also a sign of depression or anxiety. Other medical problems that cause insomnia include conditions that cause pain, night leg cramps, coughing, shortness of breath, urinary problems, and fevers. Sleep apnea is an abnormal breathing pattern at night that can cause insomnia and loud snoring. Certain medications and excess intake of caffeine drinks (coffee, tea, colas) can also interfere with normal sleep. Treatment for insomnia depends on the cause. Besides specific medical treatment, the following measures can help you relax and get better sleep. Get regular exercise every day, at least several hours before bed time. Try to get to bed at the same time every night. Take a hot bath before retiring to help you relax. Do not stay in bed if you are unable to sleep. During the daytime avoid staying in bed to watch television, eat, or read. Reduce unwanted noise and light in your room. Keep your room at a comfortable temperature. Avoid alcohol as it causes one to sleep less soundly, may cause you to awaken during the night, and can leave you feeling groggy the next day. Using a mild sedative prescribed or suggested by your caregiver may be needed, but the daily use of sleeping pills is not recommended. Anti-depressant medicines can improve sleep in people with depression. Please call your doctor for follow up care to better understand the cause and proper treatment of your insomnia. Document Released: 11/24/2004 Document Revised: 01/09/2012 Document Reviewed: 10/17/2005 Exit[employer] Patient Information 2014 [employer].  

## 2013-10-18 NOTE — Progress Notes (Signed)
[facility]  Provider:  Melvyn Novas, M D  Referring Provider: Ezequiel Kayser, MD Primary Care Physician:  Ezequiel Kayser, MD  Chief Complaint  Patient presents with  . sleep consult    OSA Lorraine Little APNEA    HPI:  Lorraine Little is a 72 y.o. female  Is seen here as a referral/ revisit  from Dr. Waynard Edwards for evaluation of sleep apnea ?  Lorraine Little an established patient in our practice is presenting here today with a complaint of intermittent insomnia. Her primary care physician Dr. Loraine Leriche the knee has at times prescribed sleep aids but felt that it was reasonable to evaluate her sleep further before continuing to prescribe and possible sedatives. The patient does not have a confirmed diagnosis of apnea but she has several risk factors that would point to it.  She has a history of migraine headaches with at times morning onset, she has intermittent insomnia, her husband has reported that she snores.  She has frequently woken up with hoarseness and a very dry mouth. No nocturia. No obesity and no history of neck surgery or trauma/ she has chronic tension pain and myalgia .    The patient reports going to bed between 11 PM and 11:30 PM, even when taking temazepam it will take her on average until 1 AM to initiate sleep.  She sleeps alone, the bedroom is quiet and dark, no lights.  Off and she feels quite awake at night and so she developed the habit of rub or breathing or entertaining herself otherwise until she feels ready to sleep. She phenomena use the bedroom and cultures are living onto read. Does not watch TV in bed- there is no screen exposure at bedtime. She does not have nocturia. She wakes up in the morning of round 7:30 spontaneously, does not need to use an alarm. Her breakfast consists of caffeine free beverages and foot. No naps are taken .She is physically active, water aerobics three times a week, and out door active . She is exposed to natural day light. She may  rest , relax for 30 minutes but doesn't fall asleep. No ETOH and no tobacco use, 10 Pm news are watched routinely , one hour later she will go to bed.   No sleep choking, no pain, and no fragmentation. Insomnia began many years ago ( 10 -12) , around the time her husband had CABG surgery. No sleep walking or sleep talking history .   Last visit history : She developed migraines many years ago and followed Dr. Meryl Crutch.  Meanwhile, the medical history has changed and her headaches that are today's chief complaint are different. Her dentist ruled out TMJ , and her rheumatologist felt her complains not to be related to GCA. She underwent a MRI with Dr Waynard Edwards, showing only small vessel disease and  no strokes, basal ganglia lacunes.  When last seen had  the  feeling of movement in the temples- or their muscles has been steady- she does not call this a headache , went through with an extensive ophtalomological exam and found to have no Glaucoma exacerbation.  of pressure in the temple area on both sides of the head, radiating to the back and  the occipital area. Retrorbital pressure , too   Her skin feels creepy, crawly and there is a sensation as if her skin is pulled up to a point.  There is no pulsatile character and no  nausea.  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 She carries the diagnosis of myalgia rheumatica for 11 years and she was diagnosed with a DDD of the cervical spine.  She reports some partial relief with  Excedrin Migraine, not from ibuprofen nor Aleve.  Her neck spasms respond best to biofreeze, applied topically. She was placed on Imipramine with great reduction in headaches,but that has been stopped and she is now on Gabapentin.  Pt had a bubble study, emboli monitoring and PVD ultrasound  and Temporal artery doppler that were negative.  She is currently on  Gabapentin 400mg  daily,(total dose) tolerating without S/E and relief of symptoms.She occasionally has a headache late in the day that begins in the occlipital area. Sometimes her headaches are in the frontal area. No real change in the character of headaches.  Established patient here - suffered a stroke due to atrial  fib in Feb 2013 she had 3 cardioversions and is followed by Sherryl Manges.  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   06/13/13 Returns for followup. 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 She has cut back on her dose of  Gabapentin.Her at fib is currently controlled but she continues to have med adjustments and she is considering ablation.  She denies further stroke symptoms. She is going to water aerobics 3 times a week.     Review of Systems: Out of a complete 14 system review, the patient complains of only the following symptoms, and all other reviewed systems are negative. Snoring, dry mouth, palpitations, myalgia.  She endorsed today the Epworth Sleepiness Scale at 3 points, the fatigue severity score of 24 points and the geriatric depression score at 0 points.  History   Social History  . Marital Status: Married    Spouse Name: Lorraine Little    Number of Children: 2  . Years of Education: college   Occupational History  . retired      [employer] Production designer, theatre/television/film    Social History Main Topics  . Smoking status: Never Smoker   . Smokeless tobacco: Never Used  . Alcohol Use: 0.6 oz/week    1 Glasses of wine per week     Comment: rare  . Drug Use: No  . Sexual Activity: Not on file   Other Topics Concern  . Not on file   Social History Narrative   Pt lives in [place] with spouse.  Lorraine Little)  She was previously a Engineer, site.    Caffeine- None   Right handed    Family History  Problem Relation Age of Onset  . Stroke Mother     Deceased @ 72  . Heart failure Mother     died from  .  Liver cancer Father     Deceased @ 109  . Colon cancer Paternal Grandmother     Past Medical History  Diagnosis Date  . Polymyalgia rheumatica     a. On chronic steroids  . Hypertension   . GERD (gastroesophageal reflux disease)     pt denies symptoms with this  . Migraine headache   . Cervical disc disease   . Osteoporosis   . Glaucoma   . Osteoarthritis   . Persistent atrial fibrillation     a.  Diagnosed 07/2011;  b. Normal nuclear myoview 07/2011 and normal EF at that time;  c.  Most recent echo 2/20 /13 - EF 55-60% wit mild-mod MR.;  d. s/p DCCV 01/12/2012  . Hyperlipidemia     a. Diagnosed 12/2011  . CVA (cerebral infarction)     a. 11/2011  . Diverticulosis   . Internal hemorrhoid   . Tubular adenoma of colon 04/2007  . Adrenal insufficiency     takes prednisone 5mg  daily  . Depression     Past Surgical History  Procedure Laterality Date  . Breast lumpectomy  1980's    right  x2 (benign)  . Tonsillectomy  age 42  . Cardioversion  01/12/2012    Procedure: CARDIOVERSION;  Surgeon: Lewayne Bunting, MD;  Location: [facility] OR;  Service: Cardiovascular;  Laterality: N/A;  . Cardioversion  03/28/2012    Procedure: CARDIOVERSION;  Surgeon: Laurey Morale, MD;  Location: [facility] OR;  Service: Cardiovascular;  Laterality: N/A;  . Cardioversion  06/29/2012    Procedure: CARDIOVERSION;  Surgeon: Cassell Clement, MD;  Location: [facility] ENDOSCOPY;  Service: Cardiovascular;  Laterality: N/A;  . Glaucoma surgery      Current Outpatient Prescriptions  Medication Sig Dispense Refill  . alendronate (FOSAMAX) 70 MG tablet Take 70 mg by mouth every 7 (seven) days. Take with a full glass of water on an empty stomach.      Marland Kitchen amiodarone (PACERONE) 200 MG tablet TAKE 1 TABLETS (400 MG TOTAL) BY MOUTH DAILY.      Marland Kitchen atorvastatin (LIPITOR) 10 MG tablet Take 10 mg by mouth daily.       . Calcium Carbonate-Vitamin D (CALCIUM + D) 600-200 MG-UNIT TABS Take 1 tablet by mouth daily.       . Cholecalciferol (VITAMIN  D-3) 5000 UNITS TABS Take by mouth. 1 tab daily      . Coenzyme Q10 (CO Q 10 PO) Take 1 tablet by mouth daily.      Marland Kitchen FINACEA 15 % cream       . fish oil-omega-3 fatty acids 1000 MG capsule Take 1 g by mouth 2 (two) times daily.       Marland Kitchen gabapentin (NEURONTIN) 100 MG capsule Take 1 capsule (100 mg total) by mouth 3 (three) times daily. 1 in the am, 1 mid afternoon and 2 hs  120 capsule  11  . KLOR-CON M10 10 MEQ tablet TAKE 2 TABLETS BY MOUTH EVERY DAY  60 tablet  3  . pantoprazole (PROTONIX) 40 MG tablet Take 40 mg by mouth daily.       . predniSONE (DELTASONE) 5 MG tablet 1 tab daily      . temazepam (RESTORIL) 15 MG capsule Take 15 mg by mouth at bedtime.       . triamterene-hydrochlorothiazide (MAXZIDE-25) 37.5-25 MG per tablet Take 0.5 tablets by mouth daily.      [name] 20 MG TABS tablet TAKE 1 TABLET (20 MG TOTAL) BY MOUTH AT BEDTIME.  30 tablet  3  . Sennosides (SENNA LAX PO) Take 1 tablet by mouth at bedtime as needed. For constipation      . [DISCONTINUED] flecainide (TAMBOCOR) 100 MG tablet Take 100mg  1 tablet prn onset of atrial fib.  30 tablet  6   No current facility-administered medications for this visit.    Allergies as of 10/18/2013  . (No Known Allergies)    Vitals: BP 150/73  Pulse 70  Resp 16  Ht 5\' 2"  (1.575 m)  Wt 128 lb (58.06 kg)  BMI 23.41 kg/m2 Last Weight:  Wt Readings from Last 1 Encounters:  10/18/13 128 lb (58.06 kg)   Last Height:   Ht Readings from Last 1 Encounters:  10/18/13 5\' 2"  (1.575 m)    Physical exam:  General: The patient is awake, alert and appears not in acute distress. The patient is well groomed. Head: Normocephalic, atraumatic. Neck is supple. Mallampati 2, neck circumference: 14  Cardiovascular:  Regular rate and rhythm , without  murmurs or carotid bruit, and without distended neck veins. Respiratory: Lungs are clear to auscultation. Skin:  Without evidence of edema, or rash Trunk: BMI is normal .  Neurologic exam  : The patient is awake and alert, oriented to place and time.  Memory subjective  described as intact. There is a normal attention span & concentration ability.  Speech is fluent without dysarthria, dysphonia or aphasia. Mood and affect are appropriate.  Cranial nerves: Pupils are equal and briskly reactive to light. Funduscopic exam without  evidence of pallor or edema. Extraocular movements  in vertical and horizontal planes intact and without nystagmus. Visual fields by finger perimetry are intact. Hearing to finger rub intact.  Facial sensation intact to fine touch. Facial motor strength is symmetric and tongue and uvula move midline.  Motor exam:   Normal tone and normal muscle bulk and symmetric normal strength in all extremities.  Sensory:  Fine touch, pinprick and vibration were tested in all extremities and present . Proprioception is normal.  Coordination: Rapid alternating movements in the fingers/hands is  normal. Finger-to-nose maneuver tested without evidence of ataxia, dysmetria or tremor.  Gait and station: Patient walks without assistive device. Deep tendon reflexes: in the  upper and lower extremities are symmetric and intact. Babinski maneuver response is  downgoing.   Assessment:  After physical and neurologic examination, review of laboratory studies, imaging, neurophysiology testing and pre-existing records, assessment is:  Chronic insomnia- snoring reported history of embolic stroke form atrial fibrillation. Order SPLIT study for this patient.       Plan:  Treatment plan and additional workup : SPLIT at AHI 15 and score at 4%.    Addendum : Dr. Marlis Edelson note on Stroke service.  72 year old Caucasian lady admitted for evaluation for her stroke- 45 minute episode yesterday of sudden onset of left upper extremity paresthesias followed by left lower face numbness as well as slurred speech and facial droop.  She has a history of paroxysmal atrial fibrillation and had  been on Plavix for but was taken off it a month ago by her cardiologist. She was tolerating products are without significant side effects. She has no other history of strokes or TIAs..Vascular risk factors include hypertension, AFIB, age and sex  CT head shows ill-defined hypodensity which likely from changes of chronic microvascular ischemia. MRI is positive , embolism from atrial fibrillation.

## 2013-11-27 ENCOUNTER — Ambulatory Visit (INDEPENDENT_AMBULATORY_CARE_PROVIDER_SITE_OTHER): Payer: Medicare Other | Admitting: Neurology

## 2013-11-27 DIAGNOSIS — G4700 Insomnia, unspecified: Secondary | ICD-10-CM

## 2013-11-27 DIAGNOSIS — G4730 Sleep apnea, unspecified: Secondary | ICD-10-CM

## 2013-11-27 DIAGNOSIS — G4733 Obstructive sleep apnea (adult) (pediatric): Secondary | ICD-10-CM

## 2013-11-27 DIAGNOSIS — I4891 Unspecified atrial fibrillation: Secondary | ICD-10-CM

## 2013-11-27 DIAGNOSIS — I639 Cerebral infarction, unspecified: Secondary | ICD-10-CM

## 2013-11-27 DIAGNOSIS — F5102 Adjustment insomnia: Secondary | ICD-10-CM

## 2013-11-27 DIAGNOSIS — R0683 Snoring: Secondary | ICD-10-CM

## 2013-12-02 NOTE — Telephone Encounter (Signed)
Error

## 2013-12-03 ENCOUNTER — Telehealth: Payer: Self-pay

## 2013-12-03 NOTE — Telephone Encounter (Signed)
  12/03/2013   RE: Lorraine Little DOB: 10-13-1941 MRN: [id number]   Dear Dr. Rayann Heman and Dr. Caryl Comes,    We have scheduled the above patient for an endoscopic procedure. Our records show that she is on anticoagulation therapy.   Please advise as to how long the patient may come off her therapy of Xarelto prior to the procedure, which is scheduled for 12/13/13. Please route back as soon as possible.   Sincerely,    Marlon Pel, CMA

## 2013-12-04 NOTE — Telephone Encounter (Signed)
She has had a prior stroke,  Stopping the Rivaroxaban 24 hrs prior to procedure ( ie  the night before) with resumption the day of the procedure if possible is her safest bet and doin the procedure as late int he day as possible thanks

## 2013-12-05 NOTE — Telephone Encounter (Signed)
Discussed Dr. Olin Pia recommendations with stopping the Xarelto. Asked patient if we can reschedule her date and time to make her appt late in the day per Dr. Olin Pia recommendations. Patient agreed and and appt rescheduled for 01/21/14. Went over prep instructions with patient. Pt agreed and verbalized understanding.

## 2013-12-05 NOTE — Telephone Encounter (Deleted)
Patient is scheduled for 8:30am on 12/13/13. Unfortunately, Dr. Lynne Leader schedule is full until the end of March.

## 2013-12-06 ENCOUNTER — Telehealth: Payer: Self-pay | Admitting: Neurology

## 2013-12-06 ENCOUNTER — Ambulatory Visit (INDEPENDENT_AMBULATORY_CARE_PROVIDER_SITE_OTHER): Payer: Self-pay | Admitting: *Deleted

## 2013-12-06 DIAGNOSIS — G4730 Sleep apnea, unspecified: Secondary | ICD-10-CM

## 2013-12-06 DIAGNOSIS — Z0289 Encounter for other administrative examinations: Secondary | ICD-10-CM

## 2013-12-06 DIAGNOSIS — G4733 Obstructive sleep apnea (adult) (pediatric): Secondary | ICD-10-CM

## 2013-12-06 DIAGNOSIS — G4700 Insomnia, unspecified: Secondary | ICD-10-CM

## 2013-12-06 NOTE — Telephone Encounter (Signed)
I called and spoke with the patient about her recent sleep study results. I informed the patient that the study revealed moderate obstructive sleep apnea and insomnia, but there was no evidence the insomnia is caused by sleep apnea. I informed the patient that Dr. Brett Fairy recommends desensitization before ordering CPAP. Patient will be coming in today to meet with [facility]. I will fax a copy of the report to [name] and give her copy of the report when she comes in to meet with [facility].

## 2013-12-11 NOTE — Sleep Study (Signed)
Pt arrives at sleep lab for CPAP mask fitting and desensitization Friday 12/06/13 due to:  Patient experienced very poor sleep efficiency due to significant OSA and after application of CPAP continued to have a minimal amount of sleep - although sleep quality was improved when present while patient was wearing CPAP.    CPAP Masks tried:  F&P Eson size small... Patient used Eson in the lab size medium and "liked it".  We also tried ResMed Airfit N10 size standard which sealed well and she liked it quite well.  We also used an Airfit P10 which she was originally extremely intolerant to in the sleep lab but was able to tolerate and "like" during desensitization.  CPAP Masks preferred:  Airfit P10 and Airfit N10... She was given samples of both to try at home.  Desensitization needs:  This patient didn't have trouble tolerating CPAP therapy.  Her primary problem is insomnia, it just happens to be secondary to long standing sleep apnea.  It may also have taken on a primary role in her sleep disorder as evident by continued insomnia on CPAP therapy.  However, she did not have any trouble tolerating therapy or masks during desensitization.  She was able to relax well and tried to lie both supine and on her side as she does at home.  She is willing and ready to start her CPAP trial at home.  We discussed the pros/cons of having an in lab CPAP Titration vs. Home auto-titration.  She also understands that if we do the home auto-titration, she may still be asked to do an in lab titration by her physician depending on her response to therapy.  She understood well.  She choose [facility] to provide her equipment and orders were placed today 12/11/13.  Due to her previous REM accentuation documented during a prior sleep study, auto CPAP has been ordered.  No REM sleep was seen in her most recent study.

## 2013-12-13 ENCOUNTER — Encounter: Payer: Medicare Other | Admitting: Gastroenterology

## 2013-12-30 ENCOUNTER — Other Ambulatory Visit: Payer: Self-pay | Admitting: Cardiology

## 2014-01-13 ENCOUNTER — Encounter: Payer: Self-pay | Admitting: Neurology

## 2014-01-20 ENCOUNTER — Other Ambulatory Visit: Payer: Self-pay | Admitting: *Deleted

## 2014-01-21 ENCOUNTER — Ambulatory Visit (AMBULATORY_SURGERY_CENTER): Payer: Medicare Other | Admitting: Gastroenterology

## 2014-01-21 ENCOUNTER — Encounter: Payer: Self-pay | Admitting: Gastroenterology

## 2014-01-21 VITALS — BP 124/74 | HR 71 | Temp 98.6°F | Resp 20 | Ht 62.0 in | Wt 126.0 lb

## 2014-01-21 DIAGNOSIS — Z86010 Personal history of colonic polyps: Secondary | ICD-10-CM

## 2014-01-21 DIAGNOSIS — D126 Benign neoplasm of colon, unspecified: Secondary | ICD-10-CM

## 2014-01-21 MED ORDER — SODIUM CHLORIDE 0.9 % IV SOLN: 500.0000 mL | INTRAVENOUS | Status: DC

## 2014-01-21 NOTE — Progress Notes (Signed)
Called to room to assist during endoscopic procedure.  Patient ID and intended procedure confirmed with present staff. Received instructions for my participation in the procedure from the performing physician.  

## 2014-01-21 NOTE — Patient Instructions (Signed)
YOU HAD AN ENDOSCOPIC PROCEDURE TODAY AT THE [facility]: Refer to the procedure report that was given to you for any specific questions about what was found during the examination.  If the procedure report does not answer your questions, please call your gastroenterologist to clarify.  If you requested that your care partner not be given the details of your procedure findings, then the procedure report has been included in a sealed envelope for you to review at your convenience later.  YOU SHOULD EXPECT: Some feelings of bloating in the abdomen. Passage of more gas than usual.  Walking can help get rid of the air that was put into your GI tract during the procedure and reduce the bloating. If you had a lower endoscopy (such as a colonoscopy or flexible sigmoidoscopy) you may notice spotting of blood in your stool or on the toilet paper. If you underwent a bowel prep for your procedure, then you may not have a normal bowel movement for a few days.  DIET: Your first meal following the procedure should be a light meal and then it is ok to progress to your normal diet.  A half-sandwich or bowl of soup is an example of a good first meal.  Heavy or fried foods are harder to digest and may make you feel nauseous or bloated.  Likewise meals heavy in dairy and vegetables can cause extra gas to form and this can also increase the bloating.  Drink plenty of fluids but you should avoid alcoholic beverages for 24 hours.  ACTIVITY: Your care partner should take you home directly after the procedure.  You should plan to take it easy, moving slowly for the rest of the day.  You can resume normal activity the day after the procedure however you should NOT DRIVE or use heavy machinery for 24 hours (because of the sedation medicines used during the test).    SYMPTOMS TO REPORT IMMEDIATELY: A gastroenterologist can be reached at any hour.  During normal business hours, 8:30 AM to 5:00 PM Monday through Friday,  call [phone].  After hours and on weekends, please call the GI answering service at [phone] who will take a message and have the physician on call contact you.   Following lower endoscopy (colonoscopy or flexible sigmoidoscopy):  Excessive amounts of blood in the stool  Significant tenderness or worsening of abdominal pains  Swelling of the abdomen that is new, acute  Fever of 100F or higher  FOLLOW UP: If any biopsies were taken you will be contacted by phone or by letter within the next 1-3 weeks.  Call your gastroenterologist if you have not heard about the biopsies in 3 weeks.  Our staff will call the home number listed on your records the next business day following your procedure to check on you and address any questions or concerns that you may have at that time regarding the information given to you following your procedure. This is a courtesy call and so if there is no answer at the home number and we have not heard from you through the emergency physician on call, we will assume that you have returned to your regular daily activities without incident.  SIGNATURES/CONFIDENTIALITY: You and/or your care partner have signed paperwork which will be entered into your electronic medical record.  These signatures attest to the fact that that the information above on your After Visit Summary has been reviewed and is understood.  Full responsibility of the confidentiality of this   discharge information lies with you and/or your care-partner.  HOLD ASPIRIN, ASPIRIN PRODUCTS, AND ANTI-INFLAMMATORY DRUGS FOR 3 WEEKS INCLUDING FISH OIL, VIT E.  RESUME XARELTO ON 01/23/2014.  CALL us IF YOU HAVE A LOT OF BLEEDING.  LAST COLONOSCOPY PER DR STARK.

## 2014-01-21 NOTE — Progress Notes (Signed)
Lidocaine-40mg IV prior to Propofol InductionPropofol given over incremental dosages 

## 2014-01-21 NOTE — Op Note (Addendum)
[facility]  [address] [place], [id number]   COLONOSCOPY PROCEDURE REPORT PATIENT: Priya, Matsen  MR#: [id number] BIRTHDATE: 08/18/1941 , 14  yrs. old GENDER: Female ENDOSCOPIST: Ladene Artist, MD, [facility] REFERRED BY: PROCEDURE DATE:  01/21/2014 PROCEDURE:   Colonoscopy with snare polypectomy and Hemclip placment for control of bleeding First Screening Colonoscopy - Avg.  risk and is 50 yrs.  old or older - No.  Prior Negative Screening - Now for repeat screening. N/A  History of Adenoma - Now for follow-up colonoscopy & has been > or = to 3 yrs.  Yes hx of adenoma.  Has been 3 or more years since last colonoscopy.  Polyps Removed Today? Yes. ASA CLASS:   Class III INDICATIONS:Patient's personal history of adenomatous colon polyps.  MEDICATIONS: MAC sedation, administered by CRNA and propofol (Diprivan) 250mg  IV DESCRIPTION OF PROCEDURE:   After the risks benefits and alternatives of the procedure were thoroughly explained, informed consent was obtained.  A digital rectal exam revealed no abnormalities of the rectum.   The [id number]  endoscope was introduced through the anus and advanced to the cecum, which was identified by both the appendix and ileocecal valve. No adverse events experienced.   The quality of the prep was good, using MoviPrep  The instrument was then slowly withdrawn as the colon was fully examined.  COLON FINDINGS: A sessile polyp measuring 7 mm in size was found in the sigmoid colon.  A polypectomy was performed with a cold snare. The resection was complete and the polyp tissue was completely retrieved.  Slight bleeding at the site which stopped spontaneously was then further controlled using 1 hemoclip. There was minimal blood loss from maneuver subsiding by end of procedure.   A semi-pedunculated polyp measuring 8 mm in size was found in the sigmoid colon.  A polypectomy was performed using snare cautery. The resection  was complete and the polyp tissue was completely retrieved.   Moderate diverticulosis was noted in the descending colon and sigmoid colon.  The colon was otherwise normal.  There was no diverticulosis, inflammation, polyps or cancers unless previously stated. Unable to retroflex due to a narrow rectal vault. Distal rectal views revealed small internal hemorrhoids. The time to cecum=3 minutes 30 seconds. Withdrawal time=14 minutes 45 seconds. The scope was withdrawn and the procedure completed. COMPLICATIONS: There were no complications.  ENDOSCOPIC IMPRESSION: 1.  Sessile polyp measuring 7 mm in the sigmoid colon; polypectomy performed with a cold snare; Hemoclip placed 2.  Semi-pedunculated polyp measuring 8 mm in the sigmoid colon; polypectomy performed using snare cautery 3.  Moderate diverticulosis in the descending colon and sigmoid colon 4.  Small internal hemorrhoids  RECOMMENDATIONS: 1.  Hold aspirin, aspirin products, and anti-inflammatory medications for 3 weeks including fish oil, Vit E 2.  Await pathology results 3.  Given your age will be near 24 when your next colonoscopy would be due and your health problem, we will not plan for future screening or surveillance colonoscopies 4.  Resume Xarelto on 3/26   eSigned:  Ladene Artist, MD, [facility] 01/21/2014 3:42 PM Revised: 01/21/2014 3:42 PM  cc: Crist Infante, MD   PATIENT NAME:  Lanay, Zinda MR#: [id number]

## 2014-01-22 ENCOUNTER — Telehealth: Payer: Self-pay | Admitting: *Deleted

## 2014-01-22 NOTE — Telephone Encounter (Signed)
  Follow up Call-  Call back number 01/21/2014  Post procedure Call Back phone  # [phone]  Permission to leave phone message Yes     Patient questions:  Do you have a fever, pain , or abdominal swelling? no Pain Score  0 *  Have you tolerated food without any problems? yes  Have you been able to return to your normal activities? yes  Do you have any questions about your discharge instructions: Diet   no Medications  no Follow up visit  no  Do you have questions or concerns about your Care? no  Actions: * If pain score is 4 or above: No action needed, pain <4.

## 2014-01-28 ENCOUNTER — Encounter: Payer: Self-pay | Admitting: Internal Medicine

## 2014-01-28 ENCOUNTER — Ambulatory Visit (INDEPENDENT_AMBULATORY_CARE_PROVIDER_SITE_OTHER): Payer: Medicare Other | Admitting: Internal Medicine

## 2014-01-28 VITALS — BP 159/79 | HR 84 | Ht 63.0 in | Wt 127.0 lb

## 2014-01-28 DIAGNOSIS — R0989 Other specified symptoms and signs involving the circulatory and respiratory systems: Secondary | ICD-10-CM

## 2014-01-28 DIAGNOSIS — R001 Bradycardia, unspecified: Secondary | ICD-10-CM

## 2014-01-28 DIAGNOSIS — R0609 Other forms of dyspnea: Secondary | ICD-10-CM

## 2014-01-28 DIAGNOSIS — R0600 Dyspnea, unspecified: Secondary | ICD-10-CM

## 2014-01-28 DIAGNOSIS — I498 Other specified cardiac arrhythmias: Secondary | ICD-10-CM

## 2014-01-28 DIAGNOSIS — I4891 Unspecified atrial fibrillation: Secondary | ICD-10-CM

## 2014-01-28 MED ORDER — LISINOPRIL-HYDROCHLOROTHIAZIDE 10-12.5 MG PO TABS: 1.0000 | ORAL_TABLET | Freq: Two times a day (BID) | ORAL | Status: DC

## 2014-01-28 MED ORDER — ATORVASTATIN CALCIUM 80 MG PO TABS: 80.0000 mg | ORAL_TABLET | Freq: Every day | ORAL | Status: DC

## 2014-01-28 NOTE — Patient Instructions (Addendum)
Your physician has recommended you make the following change in your medication:  1) Increase Lipitor to 80 mg daily 2) Stop Potassium chloride 3) Start Prinzide 10/12.5 mg --- please take both tablets in the morning  Your physician recommends that you return for lab work in: 2 weeks for BMET  Your physician has recommended that you have a pulmonary function test. Pulmonary Function Tests are a group of tests that measure how well air moves in and out of your lungs.  Your physician has requested that you have an echocardiogram. Echocardiography is a painless test that uses sound waves to create images of your heart. It provides your doctor with information about the size and shape of your heart and how well your heart's chambers and valves are working. This procedure takes approximately one hour. There are no restrictions for this procedure.  Your physician wants you to follow-up in: 6 months with Dr. Caryl Comes. You will receive a reminder letter in the mail two months in advance. If you don't receive a letter, please call our office to schedule the follow-up appointment.

## 2014-01-28 NOTE — Progress Notes (Signed)
Patient Care Team: Jerlyn Ly, MD as PCP - General (Internal Medicine)   HPI  Lorraine Little is a 73 y.o. female Seen in followup for atrial fibrillation. She had a discussion with Dr. Greggory Brandy concerning ablation in September 14and she elected  to continue with medical therapy which included amiodarone and Rivaroxaban   She knows infrequent flutters. They typically last just  minutes. She's had about 5 over the last of months.  She has noted some shortness of breath accompanied by edema  Some cough but developed after URI  Balance is issue since stroke  Hypertension is a concern  + sleep study but difficulty with mask fitting  Past Medical History  Diagnosis Date  . Polymyalgia rheumatica     a. On chronic steroids  . Hypertension   . GERD (gastroesophageal reflux disease)     pt denies symptoms with this  . Migraine headache   . Cervical disc disease   . Osteoporosis   . Glaucoma   . Osteoarthritis   . Persistent atrial fibrillation     a.  Diagnosed 07/2011;  b. Normal nuclear myoview 07/2011 and normal EF at that time;  c. Most recent echo 2/20 /13 - EF 55-60% wit mild-mod MR.;  d. s/p DCCV 01/12/2012  . Hyperlipidemia     a. Diagnosed 12/2011  . CVA (cerebral infarction)     a. 11/2011  . Diverticulosis   . Internal hemorrhoid   . Tubular adenoma of colon 04/2007  . Adrenal insufficiency     takes prednisone 5mg  daily  . Depression     Past Surgical History  Procedure Laterality Date  . Breast lumpectomy  1980's    right  x2 (benign)  . Tonsillectomy  age 22  . Cardioversion  01/12/2012    Procedure: CARDIOVERSION;  Surgeon: Lelon Perla, MD;  Location: [facility];  Service: Cardiovascular;  Laterality: N/A;  . Cardioversion  03/28/2012    Procedure: CARDIOVERSION;  Surgeon: Larey Dresser, MD;  Location: [facility];  Service: Cardiovascular;  Laterality: N/A;  . Cardioversion  06/29/2012    Procedure: CARDIOVERSION;  Surgeon: Darlin Coco, MD;  Location: [facility]  ENDOSCOPY;  Service: Cardiovascular;  Laterality: N/A;  . Glaucoma surgery      Current Outpatient Prescriptions  Medication Sig Dispense Refill  . alendronate (FOSAMAX) 70 MG tablet Take 70 mg by mouth every 7 (seven) days. Take with a full glass of water on an empty stomach.      Marland Kitchen amiodarone (PACERONE) 200 MG tablet TAKE 1 TABLETS (400 MG TOTAL) BY MOUTH DAILY.      Marland Kitchen atorvastatin (LIPITOR) 10 MG tablet Take 10 mg by mouth daily.       . Cholecalciferol (VITAMIN D-3) 5000 UNITS TABS Take by mouth. 1 tab daily      . Coenzyme Q10 (CO Q 10 PO) Take 1 tablet by mouth daily.      Marland Kitchen FINACEA 15 % cream       . fish oil-omega-3 fatty acids 1000 MG capsule Take 1 g by mouth 2 (two) times daily.       Marland Kitchen gabapentin (NEURONTIN) 100 MG capsule Take 1 capsule (100 mg total) by mouth 3 (three) times daily. 1 in the am, 1 mid afternoon and 2 hs  120 capsule  11  . KLOR-CON M10 10 MEQ tablet TAKE 2 TABLETS BY MOUTH EVERY DAY  60 tablet  6  . predniSONE (DELTASONE) 5 MG tablet 1  tab daily      . Sennosides (SENNA LAX PO) Take 1 tablet by mouth at bedtime as needed. For constipation      . triamterene-hydrochlorothiazide (MAXZIDE-25) 37.5-25 MG per tablet Take 0.5 tablets by mouth daily.      Alveda Reasons 20 MG TABS tablet TAKE 1 TABLET (20 MG TOTAL) BY MOUTH AT BEDTIME.  30 tablet  3  . [DISCONTINUED] flecainide (TAMBOCOR) 100 MG tablet Take 100mg  1 tablet prn onset of atrial fib.  30 tablet  6   No current facility-administered medications for this visit.    No Known Allergies  Review of Systems negative except from HPI and PMH  Physical Exam BP 159/79  Pulse 84  Ht 5\' 3"  (1.6 m)  Wt 127 lb (57.607 kg)  BMI 22.50 kg/m2 Well developed and well nourished in no acute distress HENT normal E scleral and icterus clear Neck Supple JVP flat; carotids brisk and full Clear to ausculation   Regular rate and rhythm, no murmurs gallops or rub Soft with active bowel sounds No clubbing cyanosis   Edema Alert and oriented, grossly normal motor and sensory function Skin Warm and Dry  ECG  NSR  Assessment and  Plan  Atrial fibrillation  Stroke   Hypertension  Dyspnea     She has infrequent atrial fibrillation. I think the amiodarone from infected this point review is doing well. Surveillance laboratories in December were normal. She had labs done last month. We have not seen these. I presume may 2 were normal.  However, I worry about the dyspnea and in part the cough. Because of that we will obtain DLCO. Will also obtain an echo she is concerned about LV function. She does has some edema. Neck veins however did not suggest significant volume overload.  Her blood pressure is poorly controlled. Based on the [facility] Trial we will discontinue her triamterene hydrochlorothiazide and begin her on an ACE inhibitor in conjunction with  hydrochlorothiazide. We will discontinue her potassium supplementation and check a metabolic profile in 2 weeks time  Based on the SPARCL trial we'll increase her atorvastatin 10-80 mg.  We'll plan to see her again in 6 months

## 2014-01-29 ENCOUNTER — Ambulatory Visit (INDEPENDENT_AMBULATORY_CARE_PROVIDER_SITE_OTHER): Payer: Medicare Other | Admitting: Internal Medicine

## 2014-01-29 DIAGNOSIS — R0600 Dyspnea, unspecified: Secondary | ICD-10-CM

## 2014-01-29 DIAGNOSIS — R0989 Other specified symptoms and signs involving the circulatory and respiratory systems: Secondary | ICD-10-CM

## 2014-01-29 DIAGNOSIS — R0609 Other forms of dyspnea: Secondary | ICD-10-CM

## 2014-01-29 LAB — PULMONARY FUNCTION TEST
DL/VA % pred: 103 %
DL/VA: 4.69 ml/min/mmHg/L
DLCO unc % pred: 78 %
DLCO unc: 16.9 ml/min/mmHg
FEF 25-75 Post: 1.67 L/sec
FEF 25-75 Pre: 1.66 L/sec
FEF2575-%Change-Post: 0 %
FEF2575-%Pred-Post: 107 %
FEF2575-%Pred-Pre: 107 %
FEV1-%Change-Post: 1 %
FEV1-%Pred-Post: 108 %
FEV1-%Pred-Pre: 106 %
FEV1-Post: 2.1 L
FEV1-Pre: 2.06 L
FEV1FVC-%Change-Post: 0 %
FEV1FVC-%Pred-Pre: 100 %
FEV6-%Change-Post: 1 %
FEV6-%Pred-Post: 111 %
FEV6-%Pred-Pre: 109 %
FEV6-Post: 2.73 L
FEV6-Pre: 2.69 L
FEV6FVC-%Change-Post: -1 %
FEV6FVC-%Pred-Post: 103 %
FEV6FVC-%Pred-Pre: 105 %
FVC-%Change-Post: 1 %
FVC-%Pred-Post: 107 %
FVC-%Pred-Pre: 106 %
FVC-Post: 2.78 L
FVC-Pre: 2.74 L
Post FEV1/FVC ratio: 75 %
Post FEV6/FVC ratio: 98 %
Pre FEV1/FVC ratio: 75 %
Pre FEV6/FVC Ratio: 100 %
RV % pred: 60 %
RV: 1.32 L
TLC % pred: 81 %
TLC: 3.87 L

## 2014-01-29 NOTE — Progress Notes (Signed)
PFT done today. 

## 2014-01-31 ENCOUNTER — Encounter: Payer: Self-pay | Admitting: Gastroenterology

## 2014-02-10 ENCOUNTER — Encounter: Payer: Self-pay | Admitting: Neurology

## 2014-02-10 ENCOUNTER — Ambulatory Visit (INDEPENDENT_AMBULATORY_CARE_PROVIDER_SITE_OTHER): Payer: Medicare Other | Admitting: Neurology

## 2014-02-10 VITALS — BP 124/65 | HR 77 | Resp 17 | Ht 62.5 in | Wt 126.0 lb

## 2014-02-10 DIAGNOSIS — G4700 Insomnia, unspecified: Secondary | ICD-10-CM

## 2014-02-10 DIAGNOSIS — G4733 Obstructive sleep apnea (adult) (pediatric): Secondary | ICD-10-CM | POA: Insufficient documentation

## 2014-02-10 DIAGNOSIS — Z9989 Dependence on other enabling machines and devices: Principal | ICD-10-CM

## 2014-02-10 MED ORDER — SUVOREXANT 10 MG PO TABS: 10.0000 mg | ORAL_TABLET | Freq: Every evening | ORAL | Status: DC

## 2014-02-10 NOTE — Patient Instructions (Signed)
Insomnia Insomnia is frequent trouble falling and/or staying asleep. Insomnia can be a long term problem or a short term problem. Both are common. Insomnia can be a short term problem when the wakefulness is related to a certain stress or worry. Long term insomnia is often related to ongoing stress during waking hours and/or poor sleeping habits. Overtime, sleep deprivation itself can make the problem worse. Every little thing feels more severe because you are overtired and your ability to cope is decreased. CAUSES   Stress, anxiety, and depression.  Poor sleeping habits.  Distractions such as TV in the bedroom.  Naps close to bedtime.  Engaging in emotionally charged conversations before bed.  Technical reading before sleep.  Alcohol and other sedatives. They may make the problem worse. They can hurt normal sleep patterns and normal dream activity.  Stimulants such as caffeine for several hours prior to bedtime.  Pain syndromes and shortness of breath can cause insomnia.  Exercise late at night.  Changing time zones may cause sleeping problems (jet lag). It is sometimes helpful to have someone observe your sleeping patterns. They should look for periods of not breathing during the night (sleep apnea). They should also look to see how long those periods last. If you live alone or observers are uncertain, you can also be observed at a sleep clinic where your sleep patterns will be professionally monitored. Sleep apnea requires a checkup and treatment. Give your caregivers your medical history. Give your caregivers observations your family has made about your sleep.  SYMPTOMS   Not feeling rested in the morning.  Anxiety and restlessness at bedtime.  Difficulty falling and staying asleep. TREATMENT   Your caregiver may prescribe treatment for an underlying medical disorders. Your caregiver can give advice or help if you are using alcohol or other drugs for self-medication. Treatment  of underlying problems will usually eliminate insomnia problems.  Medications can be prescribed for short time use. They are generally not recommended for lengthy use.  Over-the-counter sleep medicines are not recommended for lengthy use. They can be habit forming.  You can promote easier sleeping by making lifestyle changes such as:  Using relaxation techniques that help with breathing and reduce muscle tension.  Exercising earlier in the day.  Changing your diet and the time of your last meal. No night time snacks.  Establish a regular time to go to bed.  Counseling can help with stressful problems and worry.  Soothing music and white noise may be helpful if there are background noises you cannot remove.  Stop tedious detailed work at least one hour before bedtime. HOME CARE INSTRUCTIONS   Keep a diary. Inform your caregiver about your progress. This includes any medication side effects. See your caregiver regularly. Take note of:  Times when you are asleep.  Times when you are awake during the night.  The quality of your sleep.  How you feel the next day. This information will help your caregiver care for you.  Get out of bed if you are still awake after 15 minutes. Read or do some quiet activity. Keep the lights down. Wait until you feel sleepy and go back to bed.  Keep regular sleeping and waking hours. Avoid naps.  Exercise regularly.  Avoid distractions at bedtime. Distractions include watching television or engaging in any intense or detailed activity like attempting to balance the household checkbook.  Develop a bedtime ritual. Keep a familiar routine of bathing, brushing your teeth, climbing into bed at the same   time each night, listening to soothing music. Routines increase the success of falling to sleep faster.  Use relaxation techniques. This can be using breathing and muscle tension release routines. It can also include visualizing peaceful scenes. You can  also help control troubling or intruding thoughts by keeping your mind occupied with boring or repetitive thoughts like the old concept of counting sheep. You can make it more creative like imagining planting one beautiful flower after another in your backyard garden.  During your day, work to eliminate stress. When this is not possible use some of the previous suggestions to help reduce the anxiety that accompanies stressful situations. MAKE SURE YOU:   Understand these instructions.  Will watch your condition.  Will get help right away if you are not doing well or get worse. Document Released: 10/14/2000 Document Revised: 01/09/2012 Document Reviewed: 11/14/2007 [facility] Patient Information 2014 [employer].  Please try the I phone/ i pad application CALM for ambient noise and meditation.

## 2014-02-10 NOTE — Progress Notes (Signed)
[facility]  Provider:  Larey Seat, M D  Referring Provider: Jerlyn Ly, MD Primary Care Physician:  Jerlyn Ly, MD  Chief Complaint  Patient presents with  . Follow-up    Room 11  . cpap    HPI:  Lorraine Little is a 73 y.o., caucasian, right handed  female . She  is seen here as a revisit  from Dr. Joylene Draft for evaluation of sleep apnea with insomnia on CPAP . Dr. Clydene Fake has seen this patient  on Stroke service.    Lorraine Little an established patient in our practice is presenting here today with a complaint of intermittent insomnia. She can not even sleep on vacation [place].  Her primary care physician [name] has at times prescribed sleep aids,  but felt that it was reasonable to evaluate her sleep further before continuing to prescribe sedatives.  The patient does not have a confirmed diagnosis of apnea but she has several risk factors that would point to it. She has a history of migraine headaches with at times morning onset, she has insomnia, her husband has reported that she snores.  Lorraine Little underwent a sleep study on 11-27-13. AHI was 33.1 and her RDI was 33.1/hr. as well.   The lowest oxygen level was at 78% , 02 saturation was 12.6 minutes total time below 89% saturation. She had irregular heart rate and normal sinus rhythm throughout the diagnostic study. There were no periodic limb movements noted. Based on the high AHI CPAP was initiated and the patient was only titrated to 5 cm with. She still had a very poor sleep efficiency but AHI was reduced to 0. She used a nasal pillow and an ESON  nasal mask. The  DMD was  to a just for the patient's needs. Download was reviewed today.   She brought me today at a download from her machine and it shows that she has used the machine 5 hours and 5 minutes nightly 97% compliance over 30 days as an residual AHI of 0.4.  While having reached numerically excellent results she states that  she is laying awake for hours before finally going to sleep !-  and that she actually feels no help in sleeping deeper or longer by the CPAP machine.  The 95th percentile pressure was actually 9.8cm on AUTO-set titration at Home.  The minimum pressure was set at 5 the maximum pressure at 10 cm water and for 95% of the time the patient seems to use it at 9.8 cm water.  Again,  her main problem now is not the residual apnea that her insomnia. She had trouble sleeping for a long time before OSA was diagnosed and CPAP initiated.  The CPAP has helped her headaches! Her problem is sleep initiation, not staying asleep. She has less palpiations. Continues of amiodarone.      Last visit note:  She has frequently woken up with hoarseness and a very dry mouth. No nocturia. No obesity and no history of neck surgery or trauma/ she has chronic tension pain and myalgia .  The patient reports going to bed between 11 PM and 11:30 PM, even when taking temazepam it will take her on average until 1 AM to initiate sleep.  She sleeps alone, the bedroom is quiet and dark, no lights.  Off and she feels quite awake at night and so she developed the habit of rub or breathing or entertaining herself otherwise until she  feels ready to sleep. She phenomena use the bedroom and cultures are living onto read. Does not watch TV in bed- there is no screen exposure at bedtime. She does not have nocturia. She wakes up in the morning of round 7:30 spontaneously, does not need to use an alarm. Her breakfast consists of caffeine free beverages and foot. No naps are taken .She is physically active, water aerobics three times a week, and outdoor active .  She is exposed to natural day light. She may rest , relax for 30 minutes but doesn't fall asleep.  No ETOH and no tobacco use, 10 Pm news are watched routinely , one hour later she will go to bed.   No sleep choking, no pain, and no fragmentation. Insomnia began many years ago ( 65 -10)  , around the time her husband had CABG surgery. No sleep walking or sleep talking history .  06/13/13 Returns for followup. 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 She has cut back on her dose of  Gabapentin.Her at fib is currently controlled but she continues to have med adjustments and she is considering ablation.  She denies further stroke symptoms. She is going to water aerobics 3 times a week. 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 She carries the diagnosis of myalgia rheumatica for 11 years and she was diagnosed with a DDD of the cervical spine.  She reports some partial relief with  Excedrin Migraine, not from ibuprofen nor Aleve.  Her neck spasms respond best to biofreeze, applied topically. She was placed on Imipramine with great reduction in headaches,but that has been stopped and she is now on Gabapentin.  Pt had a bubble study, emboli monitoring and PVD ultrasound  and Temporal artery doppler that were negative.  She is currently on Gabapentin 400mg  daily,(total dose) tolerating without S/E and relief of symptoms.She occasionally has a headache late in the day that begins in the occlipital area. Sometimes her headaches are in the frontal area. No real change in the character of headaches.  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        Review of Systems: Out of a complete 14 system review, the patient complains of only the following symptoms, and all other reviewed systems are negative. Snoring, dry mouth, palpitations, myalgia, INSOMNIA .  She endorsed today the Epworth Sleepiness Scale at 3 points, the fatigue severity score of 28 points and the  geriatric depression score at 2 points.  History   Social History  . Marital Status: Married    Spouse Name: [name]    Number of Children: 3  . Years of Education: college   Occupational History  . retired      Pharmacist, hospital , [place] History Main Topics  . Smoking status: Never Smoker   . Smokeless tobacco: Never Used  . Alcohol Use: 0.6 oz/week    1 Glasses of wine per week     Comment: rare  . Drug Use: No  . Sexual Activity: Not Currently   Other Topics Concern  . Not on file   Social History Narrative   Pt lives in [place] with  spouse.  [name])  She was previously a Education officer, museum.    Caffeine- None   Right handed   Patient has three children.   Patient has a college education.    Family History  Problem Relation Age of Onset  . Stroke Mother     Deceased @ 19  . Heart failure Mother     died from  . Liver cancer Father     Deceased @ 55  . Colon cancer Paternal Grandmother     Past Medical History  Diagnosis Date  . Polymyalgia rheumatica     a. On chronic steroids  . Hypertension   . GERD (gastroesophageal reflux disease)     pt denies symptoms with this  . Migraine headache   . Cervical disc disease   . Osteoporosis   . Glaucoma   . Osteoarthritis   . Persistent atrial fibrillation     a.  Diagnosed 07/2011;  b. Normal nuclear myoview 07/2011 and normal EF at that time;  c. Most recent echo 2/20 /13 - EF 55-60% wit mild-mod MR.;  d. s/p DCCV 01/12/2012  . Hyperlipidemia     a. Diagnosed 12/2011  . CVA (cerebral infarction)     a. 11/2011  . Diverticulosis   . Internal hemorrhoid   . Tubular adenoma of colon 04/2007  . Adrenal insufficiency     takes prednisone 5mg  daily  . Depression   . Insomnia with sleep apnea     Past Surgical History  Procedure Laterality Date  . Breast lumpectomy  1980's    right  x2 (benign)  . Tonsillectomy  age 56  . Cardioversion  01/12/2012    Procedure: CARDIOVERSION;  Surgeon: Lelon Perla, MD;   Location: [facility];  Service: Cardiovascular;  Laterality: N/A;  . Cardioversion  03/28/2012    Procedure: CARDIOVERSION;  Surgeon: Larey Dresser, MD;  Location: [facility];  Service: Cardiovascular;  Laterality: N/A;  . Cardioversion  06/29/2012    Procedure: CARDIOVERSION;  Surgeon: Darlin Coco, MD;  Location: [facility] ENDOSCOPY;  Service: Cardiovascular;  Laterality: N/A;  . Glaucoma surgery      Current Outpatient Prescriptions  Medication Sig Dispense Refill  . alendronate (FOSAMAX) 70 MG tablet Take 70 mg by mouth every 7 (seven) days. Take with a full glass of water on an empty stomach.      Marland Kitchen amiodarone (PACERONE) 200 MG tablet TAKE 1 TABLETS (400 MG TOTAL) BY MOUTH DAILY.      Marland Kitchen atorvastatin (LIPITOR) 80 MG tablet Take 1 tablet (80 mg total) by mouth daily.  30 tablet  6  . Cholecalciferol (VITAMIN D-3) 5000 UNITS TABS Take by mouth. 1 tab daily      . Coenzyme Q10 (CO Q 10 PO) Take 1 tablet by mouth daily.      Marland Kitchen FINACEA 15 % cream       . fish oil-omega-3 fatty acids 1000 MG capsule Take 1 g by mouth 2 (two) times daily.       Marland Kitchen gabapentin (NEURONTIN) 100 MG capsule Take 1 capsule (100 mg total) by mouth 3 (three) times daily. 1 in the am, 1 mid afternoon and 2 hs  120 capsule  11  . lisinopril-hydrochlorothiazide (PRINZIDE) 10-12.5 MG per tablet Take 1 tablet by mouth 2 (two) times daily.  60 tablet  6  . predniSONE (DELTASONE) 5 MG tablet 1 tab daily      . Sennosides (SENNA LAX PO) Take 1 tablet by mouth at bedtime as  needed. For constipation      . triamterene-hydrochlorothiazide (MAXZIDE-25) 37.5-25 MG per tablet Take 0.5 tablets by mouth daily.      Alveda Reasons 20 MG TABS tablet TAKE 1 TABLET (20 MG TOTAL) BY MOUTH AT BEDTIME.  30 tablet  3  . [DISCONTINUED] flecainide (TAMBOCOR) 100 MG tablet Take 100mg  1 tablet prn onset of atrial fib.  30 tablet  6   No current facility-administered medications for this visit.    Allergies as of 02/10/2014  . (No Known Allergies)     Vitals: BP 124/65  Pulse 77  Resp 17  Ht 5' 2.5" (1.588 m)  Wt 126 lb (57.153 kg)  BMI 22.66 kg/m2 Last Weight:  Wt Readings from Last 1 Encounters:  02/10/14 126 lb (57.153 kg)   Last Height:   Ht Readings from Last 1 Encounters:  02/10/14 5' 2.5" (1.588 m)    Physical exam:  General: The patient is awake, alert and appears not in acute distress. The patient is very well groomed. Head: Normocephalic, atraumatic. Neck is supple. Mallampati 2, neck circumference: 14 inches . No retrognathia.  Cardiovascular:  Regular rate and rhythm , without  murmurs or carotid bruit, and without distended neck veins. Respiratory: Lungs are clear to auscultation. Skin:  Without evidence of edema, or rash Trunk: BMI is normal .  Neurologic exam : The patient is awake and alert, oriented to place and time.  Memory subjective  described as intact.  There is a normal attention span & concentration ability. The patient is articulate and cooperative.  Speech is fluent without dysarthria, dysphonia or aphasia. Mood and affect are appropriate. Cranial nerves: Pupils are equal and briskly reactive to light. Facial motor strength is symmetric and tongue and uvula move midline. Motor exam:   Normal tone and normal muscle bulk and symmetric normal strength in all extremities. Sensory:  Fine touch, pinprick and Proprioception is normal. Coordination: Rapid alternating movements in the fingers/hands is  normal.Gait and station: Patient walks without assistive device. Deep tendon reflexes: in the  upper and lower extremities are symmetric and intact.    Assessment:  After physical and neurologic examination, review of laboratory studies, imaging, neurophysiology testing and pre-existing records, assessment is:  OSA with AHI and RDI of 33, moderate - severe. Residual AHi on auto set was only 0.4 and yet she cannot sleep easier with the treated OSA . 95% was 9.8 cm water.  Insomnia , persistent. Trial of  Belsomra while continuing to use CPAP.  A half tab of 10 mg initiated as sample.

## 2014-02-11 ENCOUNTER — Encounter: Payer: Self-pay | Admitting: Cardiology

## 2014-02-11 ENCOUNTER — Other Ambulatory Visit (INDEPENDENT_AMBULATORY_CARE_PROVIDER_SITE_OTHER): Payer: Medicare Other

## 2014-02-11 ENCOUNTER — Ambulatory Visit (HOSPITAL_COMMUNITY): Payer: Medicare Other | Attending: Cardiology | Admitting: Radiology

## 2014-02-11 DIAGNOSIS — R0600 Dyspnea, unspecified: Secondary | ICD-10-CM

## 2014-02-11 DIAGNOSIS — I4891 Unspecified atrial fibrillation: Secondary | ICD-10-CM | POA: Insufficient documentation

## 2014-02-11 DIAGNOSIS — R0609 Other forms of dyspnea: Secondary | ICD-10-CM

## 2014-02-11 DIAGNOSIS — R001 Bradycardia, unspecified: Secondary | ICD-10-CM

## 2014-02-11 DIAGNOSIS — I498 Other specified cardiac arrhythmias: Secondary | ICD-10-CM | POA: Insufficient documentation

## 2014-02-11 DIAGNOSIS — R0989 Other specified symptoms and signs involving the circulatory and respiratory systems: Secondary | ICD-10-CM

## 2014-02-11 LAB — BASIC METABOLIC PANEL WITH GFR
BUN: 16 mg/dL (ref 6–23)
CO2: 35 meq/L — ABNORMAL HIGH (ref 19–32)
Calcium: 9.2 mg/dL (ref 8.4–10.5)
Chloride: 99 meq/L (ref 96–112)
Creatinine, Ser: 1 mg/dL (ref 0.4–1.2)
GFR: 55.54 mL/min — ABNORMAL LOW (ref >60.00)
Glucose, Bld: 87 mg/dL (ref 70–99)
Potassium: 3.1 meq/L — ABNORMAL LOW (ref 3.5–5.1)
Sodium: 140 meq/L (ref 135–145)

## 2014-02-11 NOTE — Progress Notes (Signed)
Echocardiogram Performed. 

## 2014-02-12 ENCOUNTER — Telehealth: Payer: Self-pay | Admitting: Neurology

## 2014-02-12 ENCOUNTER — Other Ambulatory Visit: Payer: Self-pay | Admitting: *Deleted

## 2014-02-12 ENCOUNTER — Telehealth: Payer: Self-pay | Admitting: Internal Medicine

## 2014-02-12 DIAGNOSIS — R899 Unspecified abnormal finding in specimens from other organs, systems and tissues: Secondary | ICD-10-CM

## 2014-02-12 DIAGNOSIS — E876 Hypokalemia: Secondary | ICD-10-CM

## 2014-02-12 MED ORDER — SUVOREXANT 10 MG PO TABS: 10.0000 mg | ORAL_TABLET | Freq: Every evening | ORAL | Status: DC

## 2014-02-12 MED ORDER — POTASSIUM CHLORIDE ER 10 MEQ PO TBCR
20.0000 meq | EXTENDED_RELEASE_TABLET | Freq: Every day | ORAL | Status: DC
Start: 2014-02-12 — End: 2014-08-14

## 2014-02-12 NOTE — Telephone Encounter (Signed)
Patient would like clarification on medications. Please call and advise.

## 2014-02-12 NOTE — Telephone Encounter (Addendum)
Clarified stopping Maxzide, starting Prinzide. Pt to continue potassium at this time (level low on 4/14 lab), repeat lab 4/24. Take extra 20 mEq today. C/o worsening cough since increasing Lipitor. Will review with Dr. Caryl Comes when he returns at the end of the month -- pt is agreeable to continuing medication until then.

## 2014-02-12 NOTE — Telephone Encounter (Signed)
Asked patient to destroy the prescription. i printed a new one.

## 2014-02-12 NOTE — Telephone Encounter (Addendum)
Yesterday patient was seen by Dr. Lurena Joiner was given Rx for Belsomra to be taken to pharmacy at a later date but Rx had another patient's name on it--please mail new Rx to patient--thank you.   I reprinted her prescription

## 2014-02-17 ENCOUNTER — Other Ambulatory Visit: Payer: Self-pay | Admitting: Internal Medicine

## 2014-02-18 ENCOUNTER — Telehealth: Payer: Self-pay | Admitting: Internal Medicine

## 2014-02-18 NOTE — Telephone Encounter (Signed)
Informed pt that recent lab showed low potassium level, and per our conversation on 4/15 she was coming in for repeat lab work to check level. Pt then remembered conversation last week and is agreeable to come in 4/24 for repeat lab work.

## 2014-02-18 NOTE — Telephone Encounter (Signed)
New Message  Pt received a call that she was scheduled for labs.. She recently had labs and she is requesting a call back to determine if the appt is needed.. Please assist.

## 2014-02-21 ENCOUNTER — Other Ambulatory Visit (INDEPENDENT_AMBULATORY_CARE_PROVIDER_SITE_OTHER): Payer: Medicare Other

## 2014-02-21 DIAGNOSIS — E876 Hypokalemia: Secondary | ICD-10-CM

## 2014-02-24 LAB — BASIC METABOLIC PANEL
BUN: 15 mg/dL (ref 6–23)
CHLORIDE: 99 meq/L (ref 96–112)
CO2: 29 meq/L (ref 19–32)
Calcium: 9.5 mg/dL (ref 8.4–10.5)
Creatinine, Ser: 1 mg/dL (ref 0.4–1.2)
GFR: 57.47 mL/min — ABNORMAL LOW (ref >60.00)
Glucose, Bld: 116 mg/dL — ABNORMAL HIGH (ref 70–99)
POTASSIUM: 3.6 meq/L (ref 3.5–5.1)
SODIUM: 139 meq/L (ref 135–145)

## 2014-02-25 ENCOUNTER — Other Ambulatory Visit: Payer: Self-pay | Admitting: Internal Medicine

## 2014-02-26 ENCOUNTER — Encounter: Payer: Self-pay | Admitting: Neurology

## 2014-02-27 ENCOUNTER — Encounter: Payer: Self-pay | Admitting: Neurology

## 2014-03-13 ENCOUNTER — Ambulatory Visit: Payer: Medicare Other | Admitting: Nurse Practitioner

## 2014-03-18 ENCOUNTER — Other Ambulatory Visit: Payer: Self-pay | Admitting: Internal Medicine

## 2014-03-31 ENCOUNTER — Telehealth: Payer: Self-pay | Admitting: Internal Medicine

## 2014-03-31 DIAGNOSIS — Z79899 Other long term (current) drug therapy: Secondary | ICD-10-CM

## 2014-03-31 NOTE — Telephone Encounter (Signed)
Pt was started on ACE inhibitor at 01/28/14 office visit.   Per Dr Olin Pia note: Her blood pressure is poorly controlled. Based on the [facility] Trial we will discontinue her triamterene hydrochlorothiazide and begin her on an ACE inhibitor in conjunction with hydrochlorothiazide. We will discontinue her potassium supplementation and check a metabolic profile in 2 weeks time   The pt has noticed a cough since starting Lisinopril HCT. I made the pt aware this can be a side effect of Lisinopril.  The pt would like to try a different medication.  I will forward this message to Dr Caryl Comes and Trinidad Curet RN to contact the pt with further instructions.

## 2014-03-31 NOTE — Telephone Encounter (Signed)
New message    Patient cough for the past 8 weeks,. Is there any medication that could be causing this

## 2014-04-01 MED ORDER — LOSARTAN POTASSIUM-HCTZ 50-12.5 MG PO TABS
1.0000 | ORAL_TABLET | Freq: Every day | ORAL | Status: DC
Start: 2014-04-01 — End: 2014-10-13

## 2014-04-01 NOTE — Telephone Encounter (Signed)
D/C Prinzide. Start Hyzaar 50/12.5 mg daily. Rx sent to [facility]. F/u BMET scheduled for 6/18.

## 2014-04-10 ENCOUNTER — Telehealth: Payer: Self-pay | Admitting: Internal Medicine

## 2014-04-10 NOTE — Telephone Encounter (Signed)
New message    Patient calling C/O still coughing, still hoarse . From medication losartan 50/12.5 mg. Should she need a referral to ENT.

## 2014-04-10 NOTE — Telephone Encounter (Signed)
Explained that need more recovery time. Wait until middle/end of next week -- if cough still present, follow up with PCP (per Dr. Caryl Comes). Pt did state that the cough had improved some and we discussed that the hoarseness may take more time considering she has been coughing for several months now -- but to get input from PCP on this. Pt is agreeable and will contact PCP next week if still cough/hoarse.

## 2014-04-12 ENCOUNTER — Other Ambulatory Visit: Payer: Self-pay | Admitting: Internal Medicine

## 2014-04-17 ENCOUNTER — Other Ambulatory Visit (INDEPENDENT_AMBULATORY_CARE_PROVIDER_SITE_OTHER): Payer: Medicare Other

## 2014-04-17 DIAGNOSIS — Z79899 Other long term (current) drug therapy: Secondary | ICD-10-CM

## 2014-04-17 LAB — BASIC METABOLIC PANEL
BUN: 18 mg/dL (ref 6–23)
CHLORIDE: 103 meq/L (ref 96–112)
CO2: 32 meq/L (ref 19–32)
Calcium: 9.3 mg/dL (ref 8.4–10.5)
Creatinine, Ser: 1.1 mg/dL (ref 0.4–1.2)
GFR: 53.71 mL/min — ABNORMAL LOW (ref >60.00)
Glucose, Bld: 79 mg/dL (ref 70–99)
POTASSIUM: 3.4 meq/L — AB (ref 3.5–5.1)
SODIUM: 140 meq/L (ref 135–145)

## 2014-04-18 ENCOUNTER — Telehealth: Payer: Self-pay | Admitting: Neurology

## 2014-04-18 MED ORDER — ZALEPLON 10 MG PO CAPS: 10.0000 mg | ORAL_CAPSULE | Freq: Every evening | ORAL | Status: DC | PRN

## 2014-04-18 NOTE — Telephone Encounter (Signed)
Patient indicates she has been taking Belsomra, however, it is not helping her at all.  She would like to know if something different could be prescribed instead.  Please advise.  Thank you.

## 2014-04-18 NOTE — Telephone Encounter (Signed)
Pt called states the medication Suvorexant ([place]) 10 MG TABS she is taking is not working and wants to know if there is something else she can try. Please call pt back concerning this matter. Thanks

## 2014-04-18 NOTE — Telephone Encounter (Signed)
i will order sonata 5 mg.

## 2014-04-18 NOTE — Telephone Encounter (Signed)
I called the patient back.  She is aware.   

## 2014-04-23 ENCOUNTER — Other Ambulatory Visit: Payer: Self-pay | Admitting: *Deleted

## 2014-04-23 DIAGNOSIS — E876 Hypokalemia: Secondary | ICD-10-CM

## 2014-05-06 ENCOUNTER — Other Ambulatory Visit (INDEPENDENT_AMBULATORY_CARE_PROVIDER_SITE_OTHER): Payer: Medicare Other

## 2014-05-06 DIAGNOSIS — E876 Hypokalemia: Secondary | ICD-10-CM

## 2014-05-06 LAB — BASIC METABOLIC PANEL
BUN: 18 mg/dL (ref 6–23)
CHLORIDE: 101 meq/L (ref 96–112)
CO2: 33 mEq/L — ABNORMAL HIGH (ref 19–32)
Calcium: 9.3 mg/dL (ref 8.4–10.5)
Creatinine, Ser: 1.1 mg/dL (ref 0.4–1.2)
GFR: 50.39 mL/min — AB (ref >60.00)
GLUCOSE: 74 mg/dL (ref 70–99)
Potassium: 3.7 mEq/L (ref 3.5–5.1)
SODIUM: 139 meq/L (ref 135–145)

## 2014-05-16 ENCOUNTER — Other Ambulatory Visit: Payer: Self-pay | Admitting: Neurology

## 2014-05-16 NOTE — Telephone Encounter (Signed)
Rx signed and faxed.

## 2014-05-29 ENCOUNTER — Telehealth: Payer: Self-pay

## 2014-05-29 NOTE — Telephone Encounter (Signed)
[employer] notified us they have approved our request for coverage on Belsomra effective until 05/24/2015 Ref Member # [id number]

## 2014-07-07 ENCOUNTER — Other Ambulatory Visit: Payer: Self-pay | Admitting: Internal Medicine

## 2014-07-08 ENCOUNTER — Telehealth: Payer: Self-pay | Admitting: Neurology

## 2014-07-08 NOTE — Telephone Encounter (Signed)
Patient stated she's experiencing hoarseness since March.  Saw Dr. Cleda Little on 01/28/14 regarding coughing and hoarseness, he took her off Prinizide and placed her on Losartan, also ordered Pulmonary test.  Coughing went away, but not hoarseness.  Saw PCP, Dr Lorraine Little on 6/22 he evaluated hoarseness and contributed to Acid reflux, was put on medication for acid reflux in am and pm.  Hoarseness didn't get any better.  Saw Dr. Constance Little on 8/18 he ruled out cancer or polyps, contributed hoarseness to CPAP use and suggested she use chin Strap to keep mouth close.  Hoarse still remained, questioning if this may be coming from CPAP or something else?  Please call anytime and advise.

## 2014-07-14 ENCOUNTER — Other Ambulatory Visit: Payer: Self-pay | Admitting: Nurse Practitioner

## 2014-07-23 NOTE — Telephone Encounter (Signed)
Patient would like to speak directly to Dr. Brett Fairy regarding her hoarseness.  Please call back to discuss.

## 2014-07-23 NOTE — Telephone Encounter (Signed)
Spoke to Mrs,Eisel.   The patient repeated today that she had seen the gastroenterologist and primary care physician thought that she may have acid reflux causing her hoarseness. She was then after this did not respond to proton next seen by ear nose and throat specialist, will stated that it may be the CPAP that causes any inflammation he could not see any nodular structures on her vocal cords or  any abnormality. She's using a chinstrap which are longer also not change the hoarseness. Today she spoke briefly to the CPAP coordinator, Ms. Katy Apo, or cold advanced on care to see the patient and to see if en masse considering as needed or in adjustment and settings. And in foster and advanced home care saw the patient and decided that it was likely the humidifier and to air she is using that causes her hoarseness. He increased the temperature of the humidifier chamber. She was advised to sleep with these new settings for the next month and then contact us.  CD

## 2014-07-23 NOTE — Telephone Encounter (Signed)
Patient would like a call back from Dr. Brett Fairy regarding her hoarseness. She would like her to hear her voice to understand what she is going through. Best number is [phone]. It is okay to leave a detailed message at this number.

## 2014-08-12 ENCOUNTER — Ambulatory Visit: Payer: Medicare Other | Admitting: Neurology

## 2014-08-14 ENCOUNTER — Encounter: Payer: Self-pay | Admitting: Internal Medicine

## 2014-08-14 ENCOUNTER — Encounter: Payer: Self-pay | Admitting: Neurology

## 2014-08-14 ENCOUNTER — Ambulatory Visit (INDEPENDENT_AMBULATORY_CARE_PROVIDER_SITE_OTHER): Payer: Medicare Other | Admitting: Internal Medicine

## 2014-08-14 ENCOUNTER — Ambulatory Visit (INDEPENDENT_AMBULATORY_CARE_PROVIDER_SITE_OTHER): Payer: Medicare Other | Admitting: Neurology

## 2014-08-14 VITALS — BP 138/70 | HR 75 | Temp 98.1°F | Resp 12 | Ht 62.5 in | Wt 123.0 lb

## 2014-08-14 VITALS — BP 162/70 | HR 76 | Ht 62.0 in | Wt 123.4 lb

## 2014-08-14 DIAGNOSIS — G4700 Insomnia, unspecified: Secondary | ICD-10-CM

## 2014-08-14 DIAGNOSIS — G4730 Sleep apnea, unspecified: Secondary | ICD-10-CM

## 2014-08-14 DIAGNOSIS — Z9989 Dependence on other enabling machines and devices: Principal | ICD-10-CM

## 2014-08-14 DIAGNOSIS — G4733 Obstructive sleep apnea (adult) (pediatric): Secondary | ICD-10-CM

## 2014-08-14 DIAGNOSIS — Z79899 Other long term (current) drug therapy: Secondary | ICD-10-CM

## 2014-08-14 DIAGNOSIS — I480 Paroxysmal atrial fibrillation: Secondary | ICD-10-CM

## 2014-08-14 MED ORDER — SUVOREXANT 5 MG PO TABS: 5.0000 mg | ORAL_TABLET | Freq: Every evening | ORAL | Status: DC

## 2014-08-14 NOTE — Patient Instructions (Signed)
Your physician recommends that you continue on your current medications as directed. Please refer to the Current Medication list given to you today.  Your physician wants you to follow-up in: 6 months with Dr. Klein. You will receive a reminder letter in the mail two months in advance. If you don't receive a letter, please call our office to schedule the follow-up appointment.  

## 2014-08-14 NOTE — Patient Instructions (Addendum)
Cardiac Ablation  Cardiac ablation is a procedure to stop some heart tissue from causing problems. The heart has many electrical connections. Sometimes these connections cause the heart to beat very fast or irregularly. Removing some of the problem areas can improve heart rhythm or make it normal. Ablation is done for people who:   Have Wolff-Parkinson-White syndrome.  Have other fast heart rhythms (tachycardia).  Have taken medicines for an abnormal heart rhythm (arrhythmia) and the medicines had:  No success.  Side effects.  May have a type of heartbeat that could cause death. BEFORE THE PROCEDURE   Follow instructions from your doctor about eating and drinking before the procedure.  Take your medicines as told by your doctor. Take them at regular times with water unless told differently by your doctor.  If you are taking diabetes medicine, ask your doctor how to take it. Ask if there are any special instructions you should follow. Your doctor may change how much insulin you take the day of the procedure. PROCEDURE   A special type of X-ray will be used. The X-ray helps your doctor see images of your heart during the procedure.  A small cut (incision) will be made in your neck or groin.  An IV tube will be started before the procedure begins.  You will be given a numbing medicine (anesthetic) or a medicine to help you relax (sedative).  The skin on your neck or groin will be numbed.  A needle will be put into a large vein in your neck or groin.  A thin, flexible tube (catheter) will be put in to reach your heart.  A dye will be put in the tube. The dye will show up on X-rays. It will help your doctor see the area of the heart that needs treatment.  When the heart tissue that is causing problems is found, the tip of the tube will send an electrical current to it. This will stop it from causing problems.  The tube will be taken out.  Pressure will be put on the area where  the tube was. This will keep it from bleeding. A bandage will be placed over the area. AFTER THE PROCEDURE  You will be taken to a recovery area. Your blood pressure, heart rate, and breathing will be watched. The area where the tube was will also be watched for bleeding.  You will need to lie still for 4-6 hours. This keeps the area where the tube was from bleeding. Document Released: 06/19/2013 Document Revised: 03/03/2014 Document Reviewed: 06/19/2013 [facility] Patient Information 2015 [place], [place]. This information is not intended to replace advice given to you by your health care provider. Make sure you discuss any questions you have with your health care provider.   Amiodarone tablets What is this medicine? AMIODARONE (a MEE oh da rone) is an antiarrhythmic drug. It helps make your heart beat regularly. Because of the side effects caused by this medicine, it is only used when other medicines have not worked. It is usually used for heartbeat problems that may be life threatening. This medicine may be used for other purposes; ask your health care provider or pharmacist if you have questions. COMMON BRAND NAME(S): Cordarone, Pacerone What should I tell my health care provider before I take this medicine? They need to know if you have any of these conditions: -liver disease -lung disease -other heart problems -thyroid disease -an unusual or allergic reaction to amiodarone, iodine, other medicines, foods, dyes, or preservatives -pregnant or  trying to get pregnant -breast-feeding How should I use this medicine? Take this medicine by mouth with a glass of water. Follow the directions on the prescription label. You can take this medicine with or without food. However, you should always take it the same way each time. Take your doses at regular intervals. Do not take your medicine more often than directed. Do not stop taking except on the advice of your doctor or health care professional. A  special MedGuide will be given to you by the pharmacist with each prescription and refill. Be sure to read this information carefully each time. Talk to your pediatrician regarding the use of this medicine in children. Special care may be needed. Overdosage: If you think you have taken too much of this medicine contact a poison control center or emergency room at once. NOTE: This medicine is only for you. Do not share this medicine with others. What if I miss a dose? If you miss a dose, take it as soon as you can. If it is almost time for your next dose, take only that dose. Do not take double or extra doses. What may interact with this medicine? Do not take this medicine with any of the following medications: -abarelix -apomorphine -arsenic trioxide -certain antibiotics like erythromycin, gemifloxacin, levofloxacin, pentamidine -certain medicines for depression like amoxapine, tricyclic antidepressants -certain medicines for fungal infections like fluconazole, itraconazole, ketoconazole, posaconazole, voriconazole -certain medicines for irregular heart beat like disopyramide, dofetilide, dronedarone, ibutilide, propafenone, sotalol -certain medicines for malaria like chloroquine, halofantrine -cisapride -droperidol -haloperidol -hawthorn -maprotiline -methadone -phenothiazines like chlorpromazine, mesoridazine, thioridazine -pimozide -ranolazine -red yeast rice -vardenafil -ziprasidone This medicine may also interact with the following medications: -antiviral medicines for HIV or AIDS -certain medicines for blood pressure, heart disease, irregular heart beat -certain medicines for cholesterol like atorvastatin, cerivastatin, lovastatin, simvastatin -certain medicines for hepatitis C like sofosbuvir and ledipasvir; sofosbuvir -certain medicines for seizures like phenytoin -certain medicines for thyroid problems -certain medicines that treat or prevent blood clots like  warfarin -cholestyramine -cimetidine -clopidogrel -cyclosporine -dextromethorphan -diuretics -fentanyl -general anesthetics -grapefruit juice -lidocaine -loratadine -methotrexate -other medicines that prolong the QT interval (cause an abnormal heart rhythm) -procainamide -quinidine -rifabutin, rifampin, or rifapentine -St. John's Wort -trazodone This list may not describe all possible interactions. Give your health care provider a list of all the medicines, herbs, non-prescription drugs, or dietary supplements you use. Also tell them if you smoke, drink alcohol, or use illegal drugs. Some items may interact with your medicine. What should I watch for while using this medicine? Your condition will be monitored closely when you first begin therapy. Often, this drug is first started in a hospital or other monitored health care setting. Once you are on maintenance therapy, visit your doctor or health care professional for regular checks on your progress. Because your condition and use of this medicine carry some risk, it is a good idea to carry an identification card, necklace or bracelet with details of your condition, medications, and doctor or health care professional. Dennis Bast may get drowsy or dizzy. Do not drive, use machinery, or do anything that needs mental alertness until you know how this medicine affects you. Do not stand or sit up quickly, especially if you are an older patient. This reduces the risk of dizzy or fainting spells. This medicine can make you more sensitive to the sun. Keep out of the sun. If you cannot avoid being in the sun, wear protective clothing and use sunscreen. Do not use sun  lamps or tanning beds/booths. You should have regular eye exams before and during treatment. Call your doctor if you have blurred vision, see halos, or your eyes become sensitive to light. Your eyes may get dry. It may be helpful to use a lubricating eye solution or artificial tears  solution. If you are going to have surgery or a procedure that requires contrast dyes, tell your doctor or health care professional that you are taking this medicine. What side effects may I notice from receiving this medicine? Side effects that you should report to your doctor or health care professional as soon as possible: -allergic reactions like skin rash, itching or hives, swelling of the face, lips, or tongue -blue-gray coloring of the skin -blurred vision, seeing blue green halos, increased sensitivity of the eyes to light -breathing problems -chest pain -dark urine -fast, irregular heartbeat -feeling faint or light-headed -intolerance to heat or cold -nausea or vomiting -pain and swelling of the scrotum -pain, tingling, numbness in feet, hands -redness, blistering, peeling or loosening of the skin, including inside the mouth -spitting up blood -stomach pain -sweating -unusual or uncontrolled movements of body -unusually weak or tired -weight gain or loss -yellowing of the eyes or skin Side effects that usually do not require medical attention (report to your doctor or health care professional if they continue or are bothersome): -change in sex drive or performance -constipation -dizziness -headache -loss of appetite -trouble sleeping This list may not describe all possible side effects. Call your doctor for medical advice about side effects. You may report side effects to FDA at 1-800-FDA-1088. Where should I keep my medicine? Keep out of the reach of children. Store at room temperature between 20 and 25 degrees C (68 and 77 degrees F). Protect from light. Keep container tightly closed. Throw away any unused medicine after the expiration date. NOTE: This sheet is a summary. It may not cover all possible information. If you have questions about this medicine, talk to your doctor, pharmacist, or health care provider.  2015, [employer]vier/[employer] Standard. (2014-01-20 19:48:11)

## 2014-08-14 NOTE — Progress Notes (Signed)
[facility]  Provider:  Larey Seat, M D  Referring Provider: Jerlyn Ly, MD Primary Care Physician:  Jerlyn Ly, MD  Chief Complaint  Patient presents with  . RV sleep    RM 10 Alone    HPI:  Lorraine Little is a 73 y.o., caucasian, right handed female.   She  is seen here as a revisit  from Dr. Joylene Draft for evaluation of sleep apnea with insomnia on CPAP . Dr. Clydene Fake has seen this patient on  [facility].    Lorraine Little an established patient in the [facility] and is presenting here today with a complaint of intermittent insomnia. She can not even sleep on vacation [place]. This has been a problem for several years.   Her primary care physician [name] has at times prescribed sleep aids,  but felt that it was reasonable to evaluate her sleep further before continuing to prescribe sedatives. The sleep test was positive and the patient was started on CPAP- after initially struggling, she is now compliant and used CPAP with great regularity. The mask took some getting used to. She remains hoarse - see Assessment and Plan.  I was able to obtain a new download today 08-07-14 from [facility] it chills an AHI of 0.4 and a compliant of 97% over the last 30 days with an average of 6 hours 43 minutes nightly he was.  The patient is now using a small  "nasal mask Mirage "and the humidifier was inspected ; there was normal technological function confirmed -her CPAP machine works well .  The patient endorsed the  GDS  @one  point, fatigue severity @ 27 points.    Lorraine Little underwent a sleep study on 11-27-13. AHI was 33.1 and her RDI was 33.1/hr. as well.   The lowest oxygen level was at 78% , 02 saturation was 12.6 minutes total time below 89% saturation. She had irregular heart rate and normal sinus rhythm throughout the diagnostic study. There were no periodic limb movements noted. Based on the high AHI CPAP was initiated  and the patient was only titrated to 5 cm with. She still had a very poor sleep efficiency but AHI was reduced to 0. She used a nasal pillow and an ESON  nasal mask.  Download was reviewed today.   She brought me today at a download from her machine and it shows that she has used the machine 5 hours and 5 minutes nightly , a 97% compliance over 30 days.  The residual AHI of 0.4 is very good .  While having reached numerically excellent results she states that she is laying awake for hours before finally going to sleep !-  and that she actually feels no help in sleeping deeper or longer by the CPAP machine.  The 95th percentile pressure was actually 9.8cm on AUTO-set titration at Home.  The minimum pressure was set at 5 the maximum pressure at 10 cm water and for 95% of the time the patient seems to use it at 9.8 cm water.  Again,  her main problem now is not the residual apnea that her insomnia. She had trouble sleeping for a long time before OSA was diagnosed and CPAP initiated.  The CPAP has helped her headaches! Her problem is sleep initiation, not staying asleep. She has less palpiations. Continues on amiodarone , which can induce insomnia . She is tolerating it well, but had some  neuropathic changes.  Belsomra at 10 mg was felt too strong, to groggy making in AM.  I offered her 5 mg today.   I would like for her to consider the ablation therapy form atrial fibrillation, and to be able to d/c the amiodarone treatment.  She will discuss this step with Dr. Virl Axe , her cardiologist and electrophysiologist this afternoon. 08-14-14 .      Review of Systems: Out of a complete 14 system review, the patient complains of only the following symptoms, and all other reviewed systems are negative. Snoring, dry mouth, palpitations, myalgia, INSOMNIA .    She endorsed today the Epworth Sleepiness Scale at 3 points, the fatigue severity score of 28 points and the geriatric depression score at 2  points.  History   Social History  . Marital Status: Married    Spouse Name: Lorraine Little    Number of Children: 3  . Years of Education: college   Occupational History  . retired      Pharmacist, hospital , [place] History Main Topics  . Smoking status: Never Smoker   . Smokeless tobacco: Never Used  . Alcohol Use: 0.6 oz/week    1 Glasses of wine per week     Comment: rare  . Drug Use: No  . Sexual Activity: Not Currently   Other Topics Concern  . Not on file   Social History Narrative   Pt lives in [place] with spouse.  Lorraine Little)  She was previously a Education officer, museum.    Caffeine- None   Right handed   Patient has three children.   Patient has a college education.    Family History  Problem Relation Age of Onset  . Stroke Mother     Deceased @ 72  . Heart failure Mother     died from  . Liver cancer Father     Deceased @ 64  . Colon cancer Paternal Grandmother     Past Medical History  Diagnosis Date  . Polymyalgia rheumatica     a. On chronic steroids  . Hypertension   . GERD (gastroesophageal reflux disease)     pt denies symptoms with this  . Migraine headache   . Cervical disc disease   . Osteoporosis   . Glaucoma   . Osteoarthritis   . Persistent atrial fibrillation     a.  Diagnosed 07/2011;  b. Normal nuclear myoview 07/2011 and normal EF at that time;  c. Most recent echo 2/20 /13 - EF 55-60% wit mild-mod MR.;  d. s/p DCCV 01/12/2012  . Hyperlipidemia     a. Diagnosed 12/2011  . CVA (cerebral infarction)     a. 11/2011  . Diverticulosis   . Internal hemorrhoid   . Tubular adenoma of colon 04/2007  . Adrenal insufficiency     takes prednisone 5mg  daily  . Depression   . Insomnia with sleep apnea     Past Surgical History  Procedure Laterality Date  . Breast lumpectomy  1980's    right  x2 (benign)  . Tonsillectomy  age 52  . Cardioversion  01/12/2012    Procedure: CARDIOVERSION;  Surgeon: Lelon Perla, MD;  Location: [facility];  Service:  Cardiovascular;  Laterality: N/A;  . Cardioversion  03/28/2012    Procedure: CARDIOVERSION;  Surgeon: Larey Dresser, MD;  Location: [facility];  Service: Cardiovascular;  Laterality: N/A;  . Cardioversion  06/29/2012    Procedure: CARDIOVERSION;  Surgeon: Darlin Coco, MD;  Location: [facility]  ENDOSCOPY;  Service: Cardiovascular;  Laterality: N/A;  . Glaucoma surgery      Current Outpatient Prescriptions  Medication Sig Dispense Refill  . alendronate (FOSAMAX) 70 MG tablet Take 70 mg by mouth every 7 (seven) days. Take with a full glass of water on an empty stomach.      Marland Kitchen amiodarone (PACERONE) 200 MG tablet TAKE 2 TABLETS (400 MG TOTAL) BY MOUTH DAILY.  90 tablet  2  . atorvastatin (LIPITOR) 80 MG tablet Take 1 tablet (80 mg total) by mouth daily.  30 tablet  6  . Calcium-Vitamin D 600-200 MG-UNIT per tablet Take by mouth.      . Cholecalciferol (VITAMIN D-3) 5000 UNITS TABS Take by mouth. 1 tab daily      . Coenzyme Q10 (CO Q 10 PO) Take 1 tablet by mouth daily.      Marland Kitchen FINACEA 15 % cream       . fish oil-omega-3 fatty acids 1000 MG capsule Take 1 g by mouth 2 (two) times daily.       Marland Kitchen gabapentin (NEURONTIN) 100 MG capsule Take 1 in the am, 1 at mid afternoon and 2 at bedtime  120 capsule  6  . losartan-hydrochlorothiazide (HYZAAR) 50-12.5 MG per tablet Take 1 tablet by mouth daily.  30 tablet  6  . predniSONE (DELTASONE) 5 MG tablet 1 tab daily      . Sennosides (SENNA LAX PO) Take 1 tablet by mouth at bedtime as needed. For constipation      . XARELTO 20 MG TABS tablet TAKE 1 TABLET (20 MG TOTAL) BY MOUTH AT BEDTIME.  30 tablet  3  . [DISCONTINUED] flecainide (TAMBOCOR) 100 MG tablet Take 100mg  1 tablet prn onset of atrial fib.  30 tablet  6   No current facility-administered medications for this visit.    Allergies as of 08/14/2014  . (No Known Allergies)    Vitals: BP 138/70  Pulse 75  Temp(Src) 98.1 F (36.7 C) (Oral)  Resp 12  Ht 5' 2.5" (1.588 m)  Wt 123 lb (55.792 kg)  BMI  22.12 kg/m2 Last Weight:  Wt Readings from Last 1 Encounters:  08/14/14 123 lb (55.792 kg)   Last Height:   Ht Readings from Last 1 Encounters:  08/14/14 5' 2.5" (1.588 m)    Physical exam:  General: The patient is awake, alert and appears not in acute distress. The patient is very well groomed. Head: Normocephalic, atraumatic. Neck is supple. Mallampati 2, neck circumference: 14 inches . No retrognathia.  Cardiovascular:  Regular rate and rhythm , without  murmurs or carotid bruit, and without distended neck veins. Respiratory: Lungs are clear to auscultation. Skin:  Without evidence of edema, or rash Trunk: BMI is normal .  Neurologic exam : The patient is awake and alert, oriented to place and time.  Memory subjective  described as intact.  There is a normal attention span & concentration ability. The patient is articulate and cooperative.  Speech is fluent without dysarthria, dysphonia or aphasia. Mood and affect are appropriate. Cranial nerves: Pupils are equal and briskly reactive to light. Facial motor strength is symmetric and tongue and uvula move midline. Motor exam:   Normal tone and normal muscle bulk and symmetric normal strength in all extremities. Coordination: Rapid alternating movements in the fingers/hands is  normal.Gait and station: Patient walks without assistive device. Deep tendon reflexes: in the  upper and lower extremities are symmetric and intact.    Assessment:  After physical  and neurologic examination, review of laboratory studies, imaging, neurophysiology testing and pre-existing records, assessment is:  1)OSA with AHI and RDI of 33, moderate - severe. Residual AHI on auto set was only 0.4 ,ut  she cannot sleep easier with the treated OSA .  95% pressure  was 9.8 cm water.  Set machine to 10 cm .   2) Insomnia , persistent.  A trial of Belsomra while continuing to use CPAP, it helped to sleep but she felt " groggy " in the next morning until after  12 noon.  I had given her 10 mg samples. We could try 5 mg. I discussed using a coupon for 10 days of 5 mg dose Belsomra.  Her insomnia is unchanged after the trial was completed.  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   3) Hoarseness, persistent, dry cough - first suspected to be allergic,   - she has seen Dr Jenetta Loges , after Dr Caryl Comes s/c some antihypertensives, without improvement . Dr. Joylene Draft started her on GERD medications, hoarseness was persistent.   Her ENTcleared the vocal cords and throat. He suggested a chin strap with CPAP ,  Andy at [facility] changed the mask ( mirage FX small-" for her" )  and setting, humidifier. No success.  AHI is best controlled.   I do not think it is the CPAP causing the hoarseness. The CPAP has helped her headaches!  Her first night after a taking a CPAP holiday was resulting in a headache. Pulsating, throbbing . Her problem is sleep initiation, not staying asleep. She has less palpiations.  Continues on Amiodarone , which can induce insomnia, may be hoarseness to ? She is tolerating it well, but had some neuropathic changes.  Belsomra at 10 mg was felt too strong, to groggy making in AM. I offered her 5 mg today.  I would like for her to consider the ablation therapy form atrial fibrillation, and to be able to d/c or change the  amiodarone treatment. She failed 4 cardio-versions.   Mrs. Janvier will discuss this step with Dr. Virl Axe , her cardiologist and electrophysiologist this afternoon. 08-14-14 .  Rv in 3-5 month with me or NP.  Worthy Boschert, MD

## 2014-08-14 NOTE — Progress Notes (Signed)
Patient Care Team: Jerlyn Ly, MD as PCP - General (Internal Medicine)   HPI  Lorraine Little is a 73 y.o. female Seen in followup for atrial fibrillation. She had a discussion with Dr. Greggory Brandy concerning ablation in September 14 and she elected  to continue with medical therapy which included amiodarone and Rivaroxaban   Echocardiogram 4/15 demonstrated normal left ventricular function mild-moderate MR and mild LAE (38/2.4)  she had moderate pulmonary hypertension  Over the last 6-7 months or so she's had problems with a hoarse speech. She has been evaluated for reflux, CPAP issues, the question was raised as to whether it may represent the amiodarone toxicity. Her previously identified cough is resolved    Past Medical History  Diagnosis Date  . Polymyalgia rheumatica     a. On chronic steroids  . Hypertension   . GERD (gastroesophageal reflux disease)     pt denies symptoms with this  . Migraine headache   . Cervical disc disease   . Osteoporosis   . Glaucoma   . Osteoarthritis   . Persistent atrial fibrillation     a.  Diagnosed 07/2011;  b. Normal nuclear myoview 07/2011 and normal EF at that time;  c. Most recent echo 2/20 /13 - EF 55-60% wit mild-mod MR.;  d. s/p DCCV 01/12/2012  . Hyperlipidemia     a. Diagnosed 12/2011  . CVA (cerebral infarction)     a. 11/2011  . Diverticulosis   . Internal hemorrhoid   . Tubular adenoma of colon 04/2007  . Adrenal insufficiency     takes prednisone 5mg  daily  . Depression   . Insomnia with sleep apnea     Past Surgical History  Procedure Laterality Date  . Breast lumpectomy  1980's    right  x2 (benign)  . Tonsillectomy  age 47  . Cardioversion  01/12/2012    Procedure: CARDIOVERSION;  Surgeon: Lelon Perla, MD;  Location: [facility];  Service: Cardiovascular;  Laterality: N/A;  . Cardioversion  03/28/2012    Procedure: CARDIOVERSION;  Surgeon: Larey Dresser, MD;  Location: [facility];  Service: Cardiovascular;  Laterality:  N/A;  . Cardioversion  06/29/2012    Procedure: CARDIOVERSION;  Surgeon: Darlin Coco, MD;  Location: [facility];  Service: Cardiovascular;  Laterality: N/A;  . Glaucoma surgery      Current Outpatient Prescriptions  Medication Sig Dispense Refill  . alendronate (FOSAMAX) 70 MG tablet Take 70 mg by mouth every 7 (seven) days. Take with a full glass of water on an empty stomach.      Marland Kitchen amiodarone (PACERONE) 200 MG tablet TAKE 2 TABLETS (400 MG TOTAL) BY MOUTH DAILY.  90 tablet  2  . atorvastatin (LIPITOR) 80 MG tablet Take 1 tablet (80 mg total) by mouth daily.  30 tablet  6  . Calcium-Vitamin D 600-200 MG-UNIT per tablet Take by mouth.      . Cholecalciferol (VITAMIN D-3) 5000 UNITS TABS Take by mouth. 1 tab daily      . Coenzyme Q10 (CO Q 10 PO) Take 1 tablet by mouth daily.      Marland Kitchen FINACEA 15 % cream       . fish oil-omega-3 fatty acids 1000 MG capsule Take 1 g by mouth 2 (two) times daily.       Marland Kitchen gabapentin (NEURONTIN) 100 MG capsule Take 1 in the am, 1 at mid afternoon and 2 at bedtime  120 capsule  6  . losartan-hydrochlorothiazide (  HYZAAR) 50-12.5 MG per tablet Take 1 tablet by mouth daily.  30 tablet  6  . predniSONE (DELTASONE) 1 MG tablet Take 3 mg by mouth daily with breakfast.      . Sennosides (SENNA LAX PO) Take 2 tablets by mouth at bedtime as needed. For constipation      . XARELTO 20 MG TABS tablet TAKE 1 TABLET (20 MG TOTAL) BY MOUTH AT BEDTIME.  30 tablet  3  . [DISCONTINUED] flecainide (TAMBOCOR) 100 MG tablet Take 100mg  1 tablet prn onset of atrial fib.  30 tablet  6   No current facility-administered medications for this visit.    No Known Allergies  Review of Systems negative except from HPI and PMH  Physical Exam BP 162/70  Pulse 76  Ht 5\' 2"  (1.575 m)  Wt 123 lb 6.4 oz (55.974 kg)  BMI 22.56 kg/m2 Well developed and well nourished in no acute distress HENT normal E scleral and icterus clear Neck Supple JVP flat; carotids brisk and full Clear to  ausculation   Regular rate and rhythm, no murmurs gallops or rub Soft with active bowel sounds No clubbing cyanosis  Edema Alert and oriented, grossly normal motor and sensory function Skin Warm and Dry  ECG  NSR  Assessment and  Plan  Atrial fibrillation  Stroke   Hypertension  Dyspnea   She continues to have infrequent atrial fibrillation.  I do not find reports that the literature search today while she is here as relates to reported dysphonia associated with amiodarone. We have discussed the possibility of going to see Dr. Beverely Risen done at [facility] who is a voice specialist. She would like to pursue that.   We will continue her amiodarone dose as it is.  Surveillance labs will be dnoe  We spent more than 50% of our >25 min visit in face to face counseling regarding the above  In the event that atrial fibrillation becomes more problematic, she would be willing to reconsider catheter ablation.

## 2014-08-17 ENCOUNTER — Other Ambulatory Visit: Payer: Self-pay | Admitting: Internal Medicine

## 2014-08-27 ENCOUNTER — Telehealth: Payer: Self-pay

## 2014-08-27 ENCOUNTER — Telehealth: Payer: Self-pay | Admitting: Internal Medicine

## 2014-08-27 DIAGNOSIS — G249 Dystonia, unspecified: Secondary | ICD-10-CM

## 2014-08-27 NOTE — Telephone Encounter (Signed)
°  Patient would like to know the status of referral to Dr Beverely Risen in [facility], please call and advise.

## 2014-08-27 NOTE — Telephone Encounter (Signed)
External ref entered for patient.

## 2014-08-27 NOTE — Telephone Encounter (Signed)
External referral entered for Dr. Maryln Manuel at [facility]. Patient informed.

## 2014-09-05 DIAGNOSIS — R490 Dysphonia: Secondary | ICD-10-CM | POA: Insufficient documentation

## 2014-09-05 DIAGNOSIS — M353 Polymyalgia rheumatica: Secondary | ICD-10-CM | POA: Insufficient documentation

## 2014-09-05 DIAGNOSIS — I10 Essential (primary) hypertension: Secondary | ICD-10-CM | POA: Insufficient documentation

## 2014-09-05 DIAGNOSIS — K219 Gastro-esophageal reflux disease without esophagitis: Secondary | ICD-10-CM | POA: Insufficient documentation

## 2014-09-15 ENCOUNTER — Encounter: Payer: Self-pay | Admitting: *Deleted

## 2014-09-15 ENCOUNTER — Other Ambulatory Visit: Payer: Self-pay | Admitting: Internal Medicine

## 2014-09-15 NOTE — Telephone Encounter (Signed)
This encounter was created in error - please disregard.

## 2014-09-15 NOTE — Telephone Encounter (Signed)
Information sent via staff message on 09/11/14:   Elliot Gault, RN           Patient was seen by Dr. Beverely Risen @ [facility] on 09-05-14

## 2014-10-13 ENCOUNTER — Other Ambulatory Visit: Payer: Self-pay | Admitting: Internal Medicine

## 2014-12-09 ENCOUNTER — Other Ambulatory Visit: Payer: Self-pay | Admitting: Internal Medicine

## 2015-01-05 ENCOUNTER — Other Ambulatory Visit: Payer: Self-pay | Admitting: Internal Medicine

## 2015-01-22 ENCOUNTER — Encounter: Payer: Self-pay | Admitting: Gastroenterology

## 2015-02-05 ENCOUNTER — Encounter: Payer: Self-pay | Admitting: Physician Assistant

## 2015-02-05 ENCOUNTER — Ambulatory Visit (INDEPENDENT_AMBULATORY_CARE_PROVIDER_SITE_OTHER): Payer: PPO | Admitting: Physician Assistant

## 2015-02-05 ENCOUNTER — Other Ambulatory Visit (INDEPENDENT_AMBULATORY_CARE_PROVIDER_SITE_OTHER): Payer: PPO

## 2015-02-05 VITALS — BP 130/50 | HR 78 | Ht 62.0 in | Wt 119.0 lb

## 2015-02-05 DIAGNOSIS — R1011 Right upper quadrant pain: Secondary | ICD-10-CM | POA: Diagnosis not present

## 2015-02-05 DIAGNOSIS — Z860100 Personal history of colon polyps, unspecified: Secondary | ICD-10-CM

## 2015-02-05 DIAGNOSIS — D509 Iron deficiency anemia, unspecified: Secondary | ICD-10-CM

## 2015-02-05 DIAGNOSIS — R195 Other fecal abnormalities: Secondary | ICD-10-CM

## 2015-02-05 DIAGNOSIS — Z7901 Long term (current) use of anticoagulants: Secondary | ICD-10-CM

## 2015-02-05 LAB — HEPATIC FUNCTION PANEL
ALBUMIN: 4.1 g/dL (ref 3.5–5.2)
ALT: 24 U/L (ref 0–35)
AST: 27 U/L (ref 0–37)
Alkaline Phosphatase: 62 U/L (ref 39–117)
Bilirubin, Direct: 0.2 mg/dL (ref 0.0–0.3)
TOTAL PROTEIN: 6.9 g/dL (ref 6.0–8.3)
Total Bilirubin: 0.7 mg/dL (ref 0.2–1.2)

## 2015-02-05 NOTE — Progress Notes (Signed)
Reviewed and agree with management plan.  Tammey Deeg T. Miquan Tandon, MD FACG 

## 2015-02-05 NOTE — Progress Notes (Addendum)
Patient ID: Lorraine Little, female   DOB: [birth year], 74 y.o.   MRN: [id number]   Subjective:    Patient ID: Lorraine Little, female    DOB: 1941-08-09, 74 y.o.   MRN: [id number]  HPI Lorraine Little is a pleasant 74 year old white female known to Dr. Fuller Plan who is currently being referred by Dr. Haynes Kerns for evaluation of recently documented Hemoccult positive stool and a new mild iron deficiency anemia. She had labs done on 01/08/2015 showed a TIBC of 318 iron 6 of 55 iron saturation of 17 hemoglobin 12 hematocrit of 35.7 and MCV of 91 Patient had undergone colonoscopy in March 2015 for follow-up of history of polyps. She was found to have 2 small polyps which were removed moderate diverticulosis and internal hemorrhoids.. Path on the polyps consistent with tubular adenomas. She has not had prior EGD. Patient does have history of atrial fibrillation for which she is on Xarelto and monitored by Dr. Caryl Comes. Also with sleep apnea on CPAP and had a CVA in 2013. History of adrenal insufficiency. Patient says she has not had any problems with changes in her bowel habits, but does have chronic constipation for which she takes laxatives.No melena or hematochezia. Her dysphagia heartburn indigestion no nausea or vomiting. She's not on any regular aspirin or NSAIDs. She has been feeling fine. She does mention an episode of severe abdominal pain that she had this past weekend which was new. She says she woke up in the middle of the night with a severe wavelike crampy intense abdominal pain that had her double over. This was present primarily on the right side of her abdomen and eventually radiated around into the back. She said it felt like a knife stabbing her. Lasted for 1-1/2-2 hours she did not have any nausea or vomiting diaphoresis etc. She says the pain gradually subsided and has not recurred since.  Review of Systems Pertinent positive and negative review of systems were noted in the above HPI section.  All other  review of systems was otherwise negative.  Outpatient Encounter Prescriptions as of 02/05/2015  Medication Sig  . alendronate (FOSAMAX) 70 MG tablet Take 70 mg by mouth every 7 (seven) days. Take with a full glass of water on an empty stomach.  Marland Kitchen amiodarone (PACERONE) 200 MG tablet TAKE 2 TABLETS BY MOUTH EVERY DAY  . atorvastatin (LIPITOR) 80 MG tablet TAKE 1 TABLET BY MOUTH EVERY DAY  . Calcium-Vitamin D 600-200 MG-UNIT per tablet Take by mouth.  [name] Sodium (STOOL SOFTENER PLUS PO) Take by mouth.  . Cholecalciferol (VITAMIN D-3) 5000 UNITS TABS Take by mouth. 1 tab daily  . Coenzyme Q10 (CO Q 10 PO) Take 1 tablet by mouth daily.  . ferrous sulfate 325 (65 FE) MG tablet Take 325 mg by mouth daily with breakfast.  . FINACEA 15 % cream   . fish oil-omega-3 fatty acids 1000 MG capsule Take 1 g by mouth 2 (two) times daily.   Marland Kitchen FOLIC ACID PO Take by mouth.  . gabapentin (NEURONTIN) 100 MG capsule Take 1 in the am, 1 at mid afternoon and 2 at bedtime  . polyethylene glycol (MIRALAX / GLYCOLAX) packet Take 17 g by mouth daily.  . predniSONE (DELTASONE) 1 MG tablet Take 3 mg by mouth daily with breakfast.  . rivaroxaban (XARELTO) 20 MG TABS tablet Take 20 mg by mouth daily with supper.  . Sennosides (SENNA LAX PO) Take 2 tablets by mouth at bedtime as needed. For constipation  .  valsartan-hydrochlorothiazide (DIOVAN-HCT) 320-25 MG per tablet Take 1 tablet by mouth daily.  . [DISCONTINUED] XARELTO 20 MG TABS tablet TAKE 1 TABLET (20 MG TOTAL) BY MOUTH AT BEDTIME.  . [DISCONTINUED] XARELTO 20 MG TABS tablet TAKE 1 TABLET BY MOUTH AT BEDTIME  . [DISCONTINUED] losartan-hydrochlorothiazide (HYZAAR) 50-12.5 MG per tablet TAKE 1 TABLET BY MOUTH EVERY DAY   No Known Allergies Patient Active Problem List   Diagnosis Date Noted  . Insomnia w/ sleep apnea 08/14/2014  . OSA on CPAP 02/10/2014  . Insomnia, persistent 02/10/2014  . CVA (cerebral vascular accident) 10/18/2013  . Snoring  disorder 10/18/2013  . Cerebral embolism with cerebral infarction 06/13/2013  . Disturbance of skin sensation 06/13/2013  . Hemiplegia affecting unspecified side, late effect of cerebrovascular disease 06/13/2013  . Sinus bradycardia question chronotropic incompetence 02/22/2013  . Alopecia 02/22/2013  . Tremor 12/19/2012  . Hyperlipidemia 10/03/2012  . CVA (cerebral infarction) 12/21/2011  . Atrial fibrillation-paroxysmal 08/10/2011  . Hypertension   . GERD (gastroesophageal reflux disease)   . Polymyalgia rheumatica    History   Social History  . Marital Status: Married    Spouse Name: [name]  . Number of Children: 3  . Years of Education: college   Occupational History  . retired      Pharmacist, hospital , [place] History Main Topics  . Smoking status: Never Smoker   . Smokeless tobacco: Never Used  . Alcohol Use: 0.6 oz/week    1 Glasses of wine per week     Comment: rare  . Drug Use: No  . Sexual Activity: Not Currently   Other Topics Concern  . Not on file   Social History Narrative   Pt lives in [place] with spouse.  [name])  She was previously a Education officer, museum.    Caffeine- None   Right handed   Patient has three children.   Patient has a college education.    Ms. Codner's family history includes Colon cancer in her paternal grandmother; Heart failure in her mother; Liver cancer in her father; Stroke in her mother.      Objective:    Filed Vitals:   02/05/15 0846  BP: 130/50  Pulse: 78    Physical Exam  well-developed older white female in no acute distress, pleasant blood pressure 130/50 pulse 78 height 5 foot 2 weight 119. HEENT; nontraumatic normocephalic EOMI PERRLA sclera anicteric, Supple; no JVD, Cardiaovascular ;regular rate and rhythm with S1-S2 no murmur or gallop, Pulmonary; clear bilaterally, Abdomen; soft nontender nondistended bowel sounds are active there is no palpable mass or hepatosplenomegaly the am not done this was recently documented  Hemoccult positive, Extremities; no clubbing cyanosis or edema skin warm and dry, Psych; mood and affect appropriate       Assessment & Plan:   #1 74 yo female with  Heme positive stool in setting of Xarelto,  #2 new mild iron deficiency anemia #3 hx of Adenomatous polyps- last Colonoscopy one year ago with 2 polyps removed #4 diverticulosis #5 GERD #6 Hx of CVA 2013 #7 atrial fib #8 chronic anticoagulation #9 OSA #10 adrenal insuff #11- episode of intense RUQ /back pain 5 days ago- r/o bilary colic  Plan; Schedule for EGD with Dr Merita Norton discussed in detail with pt and she is agreeable to proceed. If negative, consider capsule endoscopy, then colonoscopy if capsule negative  Pt will need to hold Xarelto for 24 hours prior to EGD-will communicate with her cardiologist Dr Caryl Comes  To confirm that  holding Xarelto is reasonable in this pt.. Will schedule for upper abdominal US  Hepatic panel   Amy Genia Harold PA-C 02/05/2015   Cc: Crist Infante, MD

## 2015-02-05 NOTE — Patient Instructions (Addendum)
You have been scheduled for an endoscopy. Please follow written instructions given to you at your visit today. If you use inhalers (even only as needed), please bring them with you on the day of your procedure. Your physician has requested that you go to www.startemmi.com and enter the access code given to you at your visit today. This web site gives a general overview about your procedure. However, you should still follow specific instructions given to you by our office regarding your preparation for the procedure.  You have been scheduled for an abdominal ultrasound at [facility] (1st floor of hospital) on Tuesday 02-10-2015 at 10:00 am . Please arrive at [facility] your appointment for registration. Make certain not to have anything to eat or drink 6 hours prior to your appointment. Should you need to reschedule your appointment, please contact radiology at [phone]. This test typically takes about 30 minutes to perform.

## 2015-02-09 ENCOUNTER — Encounter: Payer: Self-pay | Admitting: Gastroenterology

## 2015-02-10 ENCOUNTER — Ambulatory Visit (HOSPITAL_COMMUNITY)
Admission: RE | Admit: 2015-02-10 | Discharge: 2015-02-10 | Disposition: A | Discharge status: Home / Self Care | Payer: PPO | Source: Ambulatory Visit | Attending: Physician Assistant | Admitting: Physician Assistant

## 2015-02-10 DIAGNOSIS — K7689 Other specified diseases of liver: Secondary | ICD-10-CM | POA: Insufficient documentation

## 2015-02-10 DIAGNOSIS — I10 Essential (primary) hypertension: Secondary | ICD-10-CM | POA: Diagnosis not present

## 2015-02-10 DIAGNOSIS — R109 Unspecified abdominal pain: Secondary | ICD-10-CM | POA: Diagnosis not present

## 2015-02-10 DIAGNOSIS — R1011 Right upper quadrant pain: Secondary | ICD-10-CM

## 2015-02-17 ENCOUNTER — Ambulatory Visit: Payer: Medicare Other | Admitting: Neurology

## 2015-02-25 ENCOUNTER — Other Ambulatory Visit: Payer: Self-pay | Admitting: Internal Medicine

## 2015-02-26 ENCOUNTER — Ambulatory Visit (AMBULATORY_SURGERY_CENTER): Payer: PPO | Admitting: Gastroenterology

## 2015-02-26 ENCOUNTER — Encounter: Payer: Self-pay | Admitting: Gastroenterology

## 2015-02-26 VITALS — BP 135/91 | HR 67 | Temp 97.1°F | Resp 28 | Ht 62.0 in | Wt 119.0 lb

## 2015-02-26 DIAGNOSIS — D509 Iron deficiency anemia, unspecified: Secondary | ICD-10-CM | POA: Diagnosis not present

## 2015-02-26 DIAGNOSIS — R195 Other fecal abnormalities: Secondary | ICD-10-CM

## 2015-02-26 MED ORDER — SODIUM CHLORIDE 0.9 % IV SOLN: 500.0000 mL | INTRAVENOUS | Status: DC

## 2015-02-26 MED ORDER — OMEPRAZOLE 20 MG PO CPDR: 20.0000 mg | DELAYED_RELEASE_CAPSULE | Freq: Every day | ORAL | Status: DC

## 2015-02-26 NOTE — Patient Instructions (Signed)
YOU HAD AN ENDOSCOPIC PROCEDURE TODAY AT [facility]:   Refer to the procedure report that was given to you for any specific questions about what was found during the examination.  If the procedure report does not answer your questions, please call your gastroenterologist to clarify.  If you requested that your care partner not be given the details of your procedure findings, then the procedure report has been included in a sealed envelope for you to review at your convenience later.  YOU SHOULD EXPECT: Some feelings of bloating in the abdomen. Passage of more gas than usual.  Walking can help get rid of the air that was put into your GI tract during the procedure and reduce the bloating.   Please Note:  You might notice some irritation and congestion in your nose or some drainage.  This is from the oxygen used during your procedure.  There is no need for concern and it should clear up in a day or so.  SYMPTOMS TO REPORT IMMEDIATELY:    Following upper endoscopy (EGD)  Vomiting of blood or coffee ground material  New chest pain or pain under the shoulder blades  Painful or persistently difficult swallowing  New shortness of breath  Fever of 100F or higher  Black, tarry-looking stools  For urgent or emergent issues, a gastroenterologist can be reached at any hour by calling [phone].   DIET: Your first meal following the procedure should be a small meal and then it is ok to progress to your normal diet. Heavy or fried foods are harder to digest and may make you feel nauseous or bloated.  Likewise, meals heavy in dairy and vegetables can increase bloating.  Drink plenty of fluids but you should avoid alcoholic beverages for 24 hours.  ACTIVITY:  You should plan to take it easy for the rest of today and you should NOT DRIVE or use heavy machinery until tomorrow (because of the sedation medicines used during the test).    FOLLOW UP: Our staff will call the number listed  on your records the next business day following your procedure to check on you and address any questions or concerns that you may have regarding the information given to you following your procedure. If we do not reach you, we will leave a message.  However, if you are feeling well and you are not experiencing any problems, there is no need to return our call.  We will assume that you have returned to your regular daily activities without incident.  If any biopsies were taken you will be contacted by phone or by letter within the next 1-3 weeks.  Please call us at [phone] if you have not heard about the biopsies in 3 weeks.    SIGNATURES/CONFIDENTIALITY: You and/or your care partner have signed paperwork which will be entered into your electronic medical record.  These signatures attest to the fact that that the information above on your After Visit Summary has been reviewed and is understood.  Full responsibility of the confidentiality of this discharge information lies with you and/or your care-partner.  Follow-up with PCP regarding your iron.  Take your omeprazole 1/2 hour before breakfast every day.

## 2015-02-26 NOTE — Op Note (Signed)
[place]  [address] [place], [address]   ENDOSCOPY PROCEDURE REPORT  PATIENT: Lorraine Little, Lorraine Little  MR#: [id number] BIRTHDATE: Jan 07, 1941 , 3  yrs. old GENDER: female ENDOSCOPIST: Ladene Artist, MD, [facility] REFERRED BY:  Crist Infante, M.D. PROCEDURE DATE:  02/26/2015 PROCEDURE:  EGD, diagnostic ASA CLASS:     Class III INDICATIONS:  iron deficiency anemia and heme positive stool. MEDICATIONS: Monitored anesthesia care and Propofol 100 mg IV TOPICAL ANESTHETIC: none DESCRIPTION OF PROCEDURE: After the risks benefits and alternatives of the procedure were thoroughly explained, informed consent was obtained.  The [id number] endoscope was introduced through the mouth and advanced to the second portion of the duodenum , Without limitations.  The instrument was slowly withdrawn as the mucosa was fully examined.    STOMACH: Moderate non erosive gastritis was found in the gastric body and gastric antrum. No biopsies performed as pt was anticoagulated on Xarelto.  The stomach otherwise appeared normal.  ESOPHAGUS: The mucosa of the esophagus appeared normal. DUODENUM: The duodenal mucosa showed no abnormalities in the bulb and 2nd part of the duodenum.  Retroflexed views revealed a small hiatal hernia.     The scope was then withdrawn from the patient and the procedure completed.  COMPLICATIONS: There were no immediate complications.  ENDOSCOPIC IMPRESSION: 1.   Gastritis in the gastric body and gastric antrum 2.   Small hiatal hernia 3.   The EGD otherwise appeared normal  RECOMMENDATIONS: 1.  PPI qam long term-omeprazole 20 mg po qam, 1 year of refills 2.  Continue Fe replacement and follow up with PCP 3.  If Fe deficiency is refractory or recurs consider evaluation of small bowel  eSigned:  Ladene Artist, MD, [facility] 02/26/2015 10:11 AM

## 2015-02-26 NOTE — Progress Notes (Signed)
To recovery, report to Hodges,RN. VSS 

## 2015-02-27 ENCOUNTER — Telehealth: Payer: Self-pay

## 2015-02-27 NOTE — Telephone Encounter (Signed)
  Follow up Call-  Call back number 02/26/2015 01/21/2014  Post procedure Call Back phone  # [phone]  Permission to leave phone message Yes Yes     Patient questions:  Do you have a fever, pain , or abdominal swelling? No. Pain Score  0 *  Have you tolerated food without any problems? Yes.    Have you been able to return to your normal activities? Yes.    Do you have any questions about your discharge instructions: Diet   No. Medications  No. Follow up visit  No.  Do you have questions or concerns about your Care? No.  Actions: * If pain score is 4 or above: No action needed, pain <4.

## 2015-03-12 ENCOUNTER — Ambulatory Visit: Payer: Self-pay | Admitting: Internal Medicine

## 2015-03-15 ENCOUNTER — Other Ambulatory Visit: Payer: Self-pay | Admitting: Internal Medicine

## 2015-03-16 ENCOUNTER — Encounter: Payer: Self-pay | Admitting: Neurology

## 2015-03-16 ENCOUNTER — Other Ambulatory Visit: Payer: Self-pay

## 2015-03-20 ENCOUNTER — Encounter: Payer: Self-pay | Admitting: Neurology

## 2015-03-20 ENCOUNTER — Ambulatory Visit (INDEPENDENT_AMBULATORY_CARE_PROVIDER_SITE_OTHER): Payer: PPO | Admitting: Neurology

## 2015-03-20 VITALS — BP 138/60 | HR 76 | Resp 18 | Ht 62.6 in | Wt 121.0 lb

## 2015-03-20 DIAGNOSIS — F5102 Adjustment insomnia: Secondary | ICD-10-CM | POA: Diagnosis not present

## 2015-03-20 DIAGNOSIS — Z9989 Dependence on other enabling machines and devices: Principal | ICD-10-CM

## 2015-03-20 DIAGNOSIS — G4733 Obstructive sleep apnea (adult) (pediatric): Secondary | ICD-10-CM

## 2015-03-20 NOTE — Patient Instructions (Signed)
Insomnia Insomnia is frequent trouble falling and/or staying asleep. Insomnia can be a long term problem or a short term problem. Both are common. Insomnia can be a short term problem when the wakefulness is related to a certain stress or worry. Long term insomnia is often related to ongoing stress during waking hours and/or poor sleeping habits. Overtime, sleep deprivation itself can make the problem worse. Every little thing feels more severe because you are overtired and your ability to cope is decreased. CAUSES   Stress, anxiety, and depression.  Poor sleeping habits.  Distractions such as TV in the bedroom.  Naps close to bedtime.  Engaging in emotionally charged conversations before bed.  Technical reading before sleep.  Alcohol and other sedatives. They may make the problem worse. They can hurt normal sleep patterns and normal dream activity.  Stimulants such as caffeine for several hours prior to bedtime.  Pain syndromes and shortness of breath can cause insomnia.  Exercise late at night.  Changing time zones may cause sleeping problems (jet lag). It is sometimes helpful to have someone observe your sleeping patterns. They should look for periods of not breathing during the night (sleep apnea). They should also look to see how long those periods last. If you live alone or observers are uncertain, you can also be observed at a sleep clinic where your sleep patterns will be professionally monitored. Sleep apnea requires a checkup and treatment. Give your caregivers your medical history. Give your caregivers observations your family has made about your sleep.  SYMPTOMS   Not feeling rested in the morning.  Anxiety and restlessness at bedtime.  Difficulty falling and staying asleep. TREATMENT   Your caregiver may prescribe treatment for an underlying medical disorders. Your caregiver can give advice or help if you are using alcohol or other drugs for self-medication. Treatment  of underlying problems will usually eliminate insomnia problems.  Medications can be prescribed for short time use. They are generally not recommended for lengthy use.  Over-the-counter sleep medicines are not recommended for lengthy use. They can be habit forming.  You can promote easier sleeping by making lifestyle changes such as:  Using relaxation techniques that help with breathing and reduce muscle tension.  Exercising earlier in the day.  Changing your diet and the time of your last meal. No night time snacks.  Establish a regular time to go to bed.  Counseling can help with stressful problems and worry.  Soothing music and white noise may be helpful if there are background noises you cannot remove.  Stop tedious detailed work at least one hour before bedtime. HOME CARE INSTRUCTIONS   Keep a diary. Inform your caregiver about your progress. This includes any medication side effects. See your caregiver regularly. Take note of:  Times when you are asleep.  Times when you are awake during the night.  The quality of your sleep.  How you feel the next day. This information will help your caregiver care for you.  Get out of bed if you are still awake after 15 minutes. Read or do some quiet activity. Keep the lights down. Wait until you feel sleepy and go back to bed.  Keep regular sleeping and waking hours. Avoid naps.  Exercise regularly.  Avoid distractions at bedtime. Distractions include watching television or engaging in any intense or detailed activity like attempting to balance the household checkbook.  Develop a bedtime ritual. Keep a familiar routine of bathing, brushing your teeth, climbing into bed at the same   time each night, listening to soothing music. Routines increase the success of falling to sleep faster.  Use relaxation techniques. This can be using breathing and muscle tension release routines. It can also include visualizing peaceful scenes. You can  also help control troubling or intruding thoughts by keeping your mind occupied with boring or repetitive thoughts like the old concept of counting sheep. You can make it more creative like imagining planting one beautiful flower after another in your backyard garden.  During your day, work to eliminate stress. When this is not possible use some of the previous suggestions to help reduce the anxiety that accompanies stressful situations. MAKE SURE YOU:   Understand these instructions.  Will watch your condition.  Will get help right away if you are not doing well or get worse. Document Released: 10/14/2000 Document Revised: 01/09/2012 Document Reviewed: 11/14/2007 ExitCare Patient Information 2015 [employer]. This information is not intended to replace advice given to you by your health care provider. Make sure you discuss any questions you have with your health care provider.  

## 2015-03-20 NOTE — Progress Notes (Signed)
[facility]  Provider:  Larey Seat, M D  Referring Provider: Crist Infante, MD Primary Care Physician:  Jerlyn Ly, MD  Chief Complaint  Patient presents with  . Follow-up    cpap, rm 11, alone    HPI:  Lorraine Little is a 74 y.o., caucasian, right handed female.   She  is seen here as a revisit  from Dr. Joylene Draft for evaluation of sleep apnea with insomnia on CPAP . Dr. Clydene Fake has seen this patient on  [facility].    [name] an established patient in the [facility] and is presenting here today with a complaint of intermittent insomnia. She can not even sleep on vacation [place]. This has been a problem for several years.   Her primary care physician [name] has at times prescribed sleep aids,  but felt that it was reasonable to evaluate her sleep further before continuing to prescribe sedatives. The sleep test was positive and the patient was started on CPAP- after initially struggling, she is now compliant and used CPAP with great regularity. The mask took some getting used to. She remains hoarse - see Assessment and Plan. I was able to obtain a new [id number] from [facility] it  Showed an AHI of 0.4 and a compliance of 97% over the last 30 days -with an average of 6 hours 43 minutes nightly he was.  The patient is now using a small  "nasal mask Mirage "and the humidifier was inspected ; there was normal technological function confirmed -her CPAP machine works well .  The patient endorsed the  GDS  @one  point, fatigue severity @ 27 points. Patient Ende underwent a sleep study on 11-27-13. AHI was 33.1 and her RDI was 33.1/hr. as well.   The lowest oxygen level was at 78% , 02 saturation was 12.6 minutes total time below 89% saturation. She had irregular heart rate and normal sinus rhythm throughout the diagnostic study. There were no periodic limb movements noted. Based on the high AHI CPAP was  initiated and the patient was only titrated to 5 cm with. She still had a very poor sleep efficiency but AHI was reduced to 0. She used a nasal pillow and an ESON  nasal mask.  Download was reviewed today. She brought me today at a download from her machine and it shows that she has used the machine 5 hours and 5 minutes nightly , a 97% compliance over 30 days.  The residual AHI of 0.4 is very good .  While having reached numerically excellent results she states that she is laying awake for hours before finally going to sleep !-  and that she actually feels no help in sleeping deeper or longer by the CPAP machine.  The 95th percentile pressure was actually 9.8cm on AUTO-set titration at Home.  The minimum pressure was set at 5 the maximum pressure at 10 cm water and for 95% of the time the patient seems to use it at 9.8 cm water.  Again,  her main problem now is not the residual apnea that her insomnia. She had trouble sleeping for a long time before OSA was diagnosed and CPAP initiated.  The CPAP has helped her headaches! Her problem is sleep initiation, not staying asleep. She has less palpiations. Continues on amiodarone , which can induce insomnia . She is tolerating it well, but had some neuropathic changes.  Belsomra at 10 mg  was felt too strong, to groggy making in AM.  I offered her 5 mg today.   I would like for her to consider the ablation therapy form atrial fibrillation, and to be able to d/c the amiodarone treatment.  She will discuss this step with Dr. Virl Axe , her cardiologist and electrophysiologist this afternoon. 08-14-14 .   Revisit on 03-20-15 [name] is here today and stated that she had a skin rash that she experienced back in February that matched the impact lines of her nasal CPAP. She discontinued the use of the CPAP at that time and had no chance to see me earlier than today to address this issue. Her compliance is accordingly poor since the machine wasn't  used. Her CPAP has worked wonders for the apnea before however she is now currently untreated. She has not responded well to the [place] for insomnia treatment and stated that she felt  "hung over "the next day. He went to the local factory and purchased melatonin which seems to help her better. Is today what kind of mask he could apply to make sure that the patient does not have the same impact lines again. She keeps the mask clean she did not share change any skin care , perfume, make up, etc.  etc. she usually rinses the mask just with water , not soap.  Nasal pillow was fitting her well and she likes these set up in principle. I think Lin[facility] Collin at [facility] for fitting her with different nasal masks. I'm not sure if she will experience another skin rash again if she returns to the CPAP impact I discussed with her the alternative of a dream wear mask, she will give it another try.    Review of Systems: Out of a complete 14 system review, the patient complains of only the following symptoms, and all other reviewed systems are negative. Snoring, dry mouth, palpitations, myalgia, INSOMNIA . Insomnia better controlled on melatonin.     She endorsed today the Epworth Sleepiness Scale at 6 points, the fatigue severity score of 28 points and the geriatric depression score at 2 points.  History   Social History  . Marital Status: Married    Spouse Name: [name]  . Number of Children: 3  . Years of Education: college   Occupational History  . retired      Pharmacist, hospital , [place] History Main Topics  . Smoking status: Never Smoker   . Smokeless tobacco: Never Used  . Alcohol Use: 0.6 oz/week    1 Glasses of wine per week     Comment: rare  . Drug Use: No  . Sexual Activity: Not Currently   Other Topics Concern  . Not on file   Social History Narrative   Pt lives in [place] with spouse.  [name])  She was previously a Education officer, museum.    Caffeine- None   Right handed    Patient has three children.   Patient has a college education.    Family History  Problem Relation Age of Onset  . Stroke Mother     Deceased @ 71  . Heart failure Mother     died from  . Liver cancer Father     Deceased @ 39  . Colon cancer Paternal Grandmother     Past Medical History  Diagnosis Date  . Polymyalgia rheumatica     a. On chronic steroids  . Hypertension   . GERD (gastroesophageal reflux  disease)     pt denies symptoms with this  . Migraine headache   . Cervical disc disease   . Osteoporosis   . Glaucoma   . Osteoarthritis   . Persistent atrial fibrillation     a.  Diagnosed 07/2011;  b. Normal nuclear myoview 07/2011 and normal EF at that time;  c. Most recent echo 2/20 /13 - EF 55-60% wit mild-mod MR.;  d. s/p DCCV 01/12/2012  . Hyperlipidemia     a. Diagnosed 12/2011  . CVA (cerebral infarction)     a. 11/2011  . Diverticulosis   . Internal hemorrhoid   . Tubular adenoma of colon 04/2007  . Adrenal insufficiency     takes prednisone 5mg  daily  . Depression   . Insomnia with sleep apnea     Past Surgical History  Procedure Laterality Date  . Breast lumpectomy  1980's    right  x2 (benign)  . Tonsillectomy  age 59  . Cardioversion  01/12/2012    Procedure: CARDIOVERSION;  Surgeon: Lelon Perla, MD;  Location: [facility];  Service: Cardiovascular;  Laterality: N/A;  . Cardioversion  03/28/2012    Procedure: CARDIOVERSION;  Surgeon: Larey Dresser, MD;  Location: [place];  Service: Cardiovascular;  Laterality: N/A;  . Cardioversion  06/29/2012    Procedure: CARDIOVERSION;  Surgeon: Darlin Coco, MD;  Location[facility] END[facility]COPY;  Service: Cardiovascular;  Laterality: N/A;  . Glaucoma surgery      Current Outpatient Prescriptions  Medication Sig Dispense Refill  . alendronate (FOSAMAX) 70 MG tablet Take 70 mg by mouth every 7 (seven) days. Take with a full glass of water on an empty stomach.    Marland Kitchen amiodarone (PACERONE) 200 MG tablet TAKE 2 TABLETS BY MOUTH  EVERY DAY 60 tablet 6  . atorvastatin (LIPITOR) 80 MG tablet TAKE 1 TABLET BY MOUTH EVERY DAY 30 tablet 6  . Calcium-Vitamin D 600-200 MG-UNIT per tablet Take by mouth.    [name] Sodium (STOOL SOFTENER PLUS PO) Take by mouth.    . Cholecalciferol (VITAMIN D-3) 5000 UNITS TABS Take by mouth. 1 tab daily    . Coenzyme Q10 (CO Q 10 PO) Take 1 tablet by mouth daily.    . ferrous sulfate 325 (65 FE) MG tablet Take 325 mg by mouth daily with breakfast.    . FINACEA 15 % cream     . fish oil-omega-3 fatty acids 1000 MG capsule Take 1 g by mouth 2 (two) times daily.     Marland Kitchen FOLIC ACID PO Take by mouth.    . gabapentin (NEURONTIN) 100 MG capsule Take 1 in the am, 1 at mid afternoon and 2 at bedtime 120 capsule 6  . omeprazole (PRILOSEC) 20 MG capsule Take 1 capsule (20 mg total) by mouth daily. 90 capsule 3  . polyethylene glycol (MIRALAX / GLYCOLAX) packet Take 17 g by mouth daily.    . predniSONE (DELTASONE) 1 MG tablet Take 3 mg by mouth daily with breakfast.    . rivaroxaban (XARELTO) 20 MG TABS tablet Take 20 mg by mouth daily with supper.    . Sennosides (SENNA LAX PO) Take 2 tablets by mouth at bedtime as needed. For constipation    . valsartan-hydrochlorothiazide (DIOVAN-HCT) 320-25 MG per tablet Take 1 tablet by mouth daily.    Alveda Reasons 20 MG TABS tablet TAKE 1 TABLET BY MOUTH AT BEDTIME 30 tablet 0  . [DISCONTINUED] flecainide (TAMBOCOR) 100 MG tablet Take 100mg  1 tablet prn onset of  atrial fib. 30 tablet 6   No current facility-administered medications for this visit.    Allergies as of 03/20/2015  . (No Known Allergies)    Vitals: BP 138/60 mmHg  Pulse 76  Resp 18  Ht 5' 2.6" (1.59 m)  Wt 121 lb (54.885 kg)  BMI 21.71 kg/m2 Last Weight:  Wt Readings from Last 1 Encounters:  03/20/15 121 lb (54.885 kg)   Last Height:   Ht Readings from Last 1 Encounters:  03/20/15 5' 2.6" (1.59 m)    Physical exam:  General: The patient is awake, alert and appears not in  acute distress. The patient is very well groomed. Head: Normocephalic, atraumatic. Neck is supple. Mallampati 2, neck circumference: 14 inches . No retrognathia.  Cardiovascular:  Regular rate and rhythm , without  murmurs or carotid bruit, and without distended neck veins. Respiratory: Lungs are clear to auscultation. Skin:  Without evidence of edema, there is a nasal and peri-nasal Rash. Trunk: BMI is normal .  Neurologic exam : The patient is awake and alert, oriented to place and time.  Memory subjective  described as intact.  There is a normal attention span & concentration ability. The patient is articulate and cooperative.  Speech is fluent without dysarthria, she has dysphonia  Mood and affect are concerned. . Cranial nerves: Pupils are equal and briskly reactive to light.  Facial motor strength is symmetric and tongue and uvula move midline. Motor exam:   Normal tone and normal muscle bulk and symmetric normal strength in all extremities. Coordination: Rapid alternating movements in the fingers/hands is  normal.Gait and station: Patient walks without assistive device. Deep tendon reflexes: in the  upper and lower extremities are symmetric and intact.    Assessment:  After physical and neurologic examination, review of laboratory studies, imaging, neurophysiology testing and pre-existing records, assessment is:  1)OSA with AHI and RDI of 33, moderate - severe. Residual AHI on auto set was only 0.4 ,ut  she cannot sleep easier with the treated OSA .  95% pressure  was 9.8 cm water.  Set machine to 10 cm . Resume using it .   2) Insomnia , persistent.   Melatonin.    3) Hoarseness, persistent, dry cough - first suspected to be allergic,   - she has seen Dr Jenetta Loges , after Dr Caryl Comes s/c some antihypertensives, without improvement . Dr. Joylene Draft started her on GERD medications, hoarseness was persistent.   Her ENTcleared the vocal cords and throat. He suggested a chin strap with CPAP ,   Andy at [facility] changed the mask ( mirage FX small-" for her" )  and setting, humidifier AHI was best controlled.    The CPAP has helped her headaches!  Rv in 4-6 month with me or NP for 30 minutes.  Marland Kitchen  Nashea Chumney, MD

## 2015-03-23 ENCOUNTER — Ambulatory Visit (INDEPENDENT_AMBULATORY_CARE_PROVIDER_SITE_OTHER): Payer: PPO | Admitting: Internal Medicine

## 2015-03-23 ENCOUNTER — Encounter: Payer: Self-pay | Admitting: Internal Medicine

## 2015-03-23 VITALS — BP 146/66 | HR 67 | Ht 62.5 in | Wt 117.4 lb

## 2015-03-23 DIAGNOSIS — I480 Paroxysmal atrial fibrillation: Secondary | ICD-10-CM

## 2015-03-23 MED ORDER — AMLODIPINE BESYLATE 5 MG PO TABS: 5.0000 mg | ORAL_TABLET | Freq: Every day | ORAL | Status: DC

## 2015-03-23 NOTE — Patient Instructions (Addendum)
Medication Instructions: - START norvasc (amlodipine) 5 mg one tablet by mouth once daily  Labwork: - none  Procedures/Testing: - none  Follow-Up: - Your physician wants you to follow-up in: 6 months with Roderic Palau, NP You will receive a reminder letter in the mail two months in advance. If you don't receive a letter, please call our office to schedule the follow-up appointment.   Any Additional Special Instructions Will Be Listed Below (If Applicable). - none

## 2015-03-23 NOTE — Progress Notes (Signed)
Electrophysiology Office Note   Date:  03/23/2015   ID:  Lorraine Little, Lorraine Little Sep 27, 1941, MRN [id number]  PCP:  Jerlyn Ly, MD  Cardiologist:    Primary Electrophysiologist: Virl Axe, MD    Chief Complaint  Patient presents with  . Appointment    6 month f/u     History of Present Illness: Lorraine Little is a 74 y.o. female Seen in followup for atrial fibrillation. She had a discussion with Dr. Greggory Brandy concerning ablation in September 14 and she elected  to continue with medical therapy which included amiodarone and Rivaroxaban   Echocardiogram 4/15 demonstrated normal left ventricular function mild-moderate MR and mild LAE (38/2.4)  she had moderate pulmonary hypertension  Over the last 6-7 months or so she's had problems with a hoarse speech. She has been evaluated for reflux, CPAP issues, the question was raised as to whether it may represent the amiodarone toxicity. She was to see Dr Beverely Risen at [facility]  Note reviewed but no A/P described  In she is feeling great from an arrhythmia point of view. She is tolerating her amiodarone. Patient denies symptoms of GI intolerance, sun sensitivity, neurological symptoms attributable to amiodarone.  Liver surveillance laboratories were in normal limits when checked31months ago NO tsh evident    Today, she denies*  symptoms of  chest pain, shortness of breath, orthopnea, PND, lower extremity edema , or neurologic sequela.  She has had bleeding. This was evaluated by GI; he found small erosive areas in the stomach; she had a negative colonoscopy a year ago. This was not repeated. She is on iron replacement therapy. she has no complaints of palpitations, lightheadedness presyncope or syncope  The patient is tolerating medications without difficulties and is otherwise without complaint today.    Past Medical History  Diagnosis Date  . Polymyalgia rheumatica     a. On chronic steroids  . Hypertension   . GERD (gastroesophageal reflux  disease)     pt denies symptoms with this  . Migraine headache   . Cervical disc disease   . Osteoporosis   . Glaucoma   . Osteoarthritis   . Persistent atrial fibrillation     a.  Diagnosed 07/2011;  b. Normal nuclear myoview 07/2011 and normal EF at that time;  c. Most recent echo 2/20 /13 - EF 55-60% wit mild-mod MR.;  d. s/p DCCV 01/12/2012  . Hyperlipidemia     a. Diagnosed 12/2011  . CVA (cerebral infarction)     a. 11/2011  . Diverticulosis   . Internal hemorrhoid   . Tubular adenoma of colon 04/2007  . Adrenal insufficiency     takes prednisone 5mg  daily  . Depression   . Insomnia with sleep apnea    Past Surgical History  Procedure Laterality Date  . Breast lumpectomy  1980's    right  x2 (benign)  . Tonsillectomy  age 69  . Cardioversion  01/12/2012    Procedure: CARDIOVERSION;  Surgeon: Lelon Perla, MD;  Location: [facility];  Service: Cardiovascular;  Laterality: N/A;  . Cardioversion  03/28/2012    Procedure: CARDIOVERSION;  Surgeon: Larey Dresser, MD;  Location: [facility];  Service: Cardiovascular;  Laterality: N/A;  . Cardioversion  06/29/2012    Procedure: CARDIOVERSION;  Surgeon: Darlin Coco, MD;  Location: [facility] ENDOSCOPY;  Service: Cardiovascular;  Laterality: N/A;  . Glaucoma surgery       Current Outpatient Prescriptions  Medication Sig Dispense Refill  . alendronate (FOSAMAX) 70 MG tablet Take  70 mg by mouth every 7 (seven) days. Take with a full glass of water on an empty stomach.    Marland Kitchen amiodarone (PACERONE) 200 MG tablet TAKE 2 TABLETS BY MOUTH EVERY DAY 60 tablet 6  . atorvastatin (LIPITOR) 80 MG tablet TAKE 1 TABLET BY MOUTH EVERY DAY 30 tablet 6  . Calcium-Vitamin D 600-200 MG-UNIT per tablet Take 1 tablet by mouth daily.     [name] Sodium (STOOL SOFTENER PLUS PO) Take 1 tablet by mouth daily.     . Cholecalciferol (VITAMIN D-3) 5000 UNITS TABS Take 1 tablet by mouth daily. 1 tab daily    . Coenzyme Q10 (CO Q 10 PO) Take 1 tablet by mouth  daily.    . ferrous sulfate 325 (65 FE) MG tablet Take 325 mg by mouth daily with breakfast.    . FIBER PO Take 1 tablet by mouth daily.    Marland Kitchen FINACEA 15 % cream     . fish oil-omega-3 fatty acids 1000 MG capsule Take 1 g by mouth 2 (two) times daily.     Marland Kitchen FOLIC ACID PO Take 1 tablet by mouth daily.     Marland Kitchen gabapentin (NEURONTIN) 100 MG capsule TAKE 1 TAB BY MOUTH IN THE AM, 1 TAB BU MOUTH MID AFTERNOON & 2 TAB BY MOUTH IN TH PM.    . Melatonin 5 MG TABS Take 2.5 mg by mouth at bedtime.    Marland Kitchen omeprazole (PRILOSEC) 20 MG capsule Take 1 capsule (20 mg total) by mouth daily. 90 capsule 3  . polyethylene glycol (MIRALAX / GLYCOLAX) packet Take 17 g by mouth daily.    . predniSONE (DELTASONE) 1 MG tablet Take 3 mg by mouth daily with breakfast.    . rivaroxaban (XARELTO) 20 MG TABS tablet Take 20 mg by mouth daily with supper.    . Sennosides (SENNA LAX PO) Take 2 tablets by mouth at bedtime as needed. For constipation    . valsartan-hydrochlorothiazide (DIOVAN-HCT) 320-25 MG per tablet Take 1 tablet by mouth daily.    . [DISCONTINUED] flecainide (TAMBOCOR) 100 MG tablet Take 100mg  1 tablet prn onset of atrial fib. 30 tablet 6   No current facility-administered medications for this visit.    Allergies:   Review of patient's allergies indicates no known allergies.   Social History:  The patient  reports that she has never smoked. She has never used smokeless tobacco. She reports that she drinks about 0.6 oz of alcohol per week. She reports that she does not use illicit drugs.   Family History:  The patient's family history includes Colon cancer in her paternal grandmother; Heart failure in her mother; Liver cancer in her father; Stroke in her mother.    ROS:  Please see the history of present illness.   Otherwise, review of systems is negative .    PHYSICAL EXAM: VS:  BP 146/66 mmHg  Pulse 67  Ht 5' 2.5" (1.588 m)  Wt 117 lb 6.4 oz (53.252 kg)  BMI 21.12 kg/m2 , BMI Body mass index is 21.12  kg/(m^2). GEN: Well nourished, well developed, in no acute distress HEENT: normal Neck: JVD  t6-7, carotid bruits, or masses Cardiac: R RR; 2/6  murmur  rubs,  noS4  Respiratory:  clear to auscultation bilaterally, normal work of breathing Back without kyphosis or CVAT GI: soft, nontender, nondistended, + BS MS: no deformity or atrophy Skin: warm and dry,   Extremities No Clubbing cyanosis none Edema Neuro:  Strength and sensation are  intact Psych: euthymic mood, full affect  EKG:  EKG is ordered today. The ekg ordered today shows sinus rhythm at 67 Intervals 19/08/41. There is a very low amplitude P waves.   Recent Labs: 05/06/2014: BUN 18; Creatinine 1.1; Potassium 3.7; Sodium 139 02/05/2015: ALT 24    Lipid Panel     Component Value Date/Time   CHOL 232* 12/21/2011 0550   TRIG 60 12/21/2011 0550   HDL 87 12/21/2011 0550   CHOLHDL 2.7 12/21/2011 0550   VLDL 12 12/21/2011 0550   LDLCALC 133* 12/21/2011 0550     Wt Readings from Last 3 Encounters:  03/23/15 117 lb 6.4 oz (53.252 kg)  03/20/15 121 lb (54.885 kg)  02/26/15 119 lb (53.978 kg)      Other studies Reviewed: Additional studies/ records that were reviewed today include: Laboratories from Dr. Joylene Draft s office and clinic notes from [place]: Afib  GI bleeding  Amiodarone for above    Current medicines are reviewed at length with the patient today.   The patient does not have concerns regarding her medicines.  The following changes were made today:  none  Labs/ tests ordered today include: We contacted the primary care office. A TSH was obtained in February and was normal.  No orders of the defined types were placed in this encounter.     Disposition:   FU with DC 6 month(s)   Signed, Virl Axe, MD  03/23/2015 4:07 PM     [address] [place] [address] [phone] (office) [phone] (fax)

## 2015-03-25 ENCOUNTER — Encounter: Payer: Self-pay | Admitting: Internal Medicine

## 2015-03-26 ENCOUNTER — Other Ambulatory Visit: Payer: Self-pay | Admitting: Internal Medicine

## 2015-03-29 ENCOUNTER — Other Ambulatory Visit: Payer: Self-pay | Admitting: Neurology

## 2015-03-30 NOTE — Telephone Encounter (Signed)
Prescribed at [facility] on 08/14

## 2015-04-27 ENCOUNTER — Other Ambulatory Visit: Payer: Self-pay

## 2015-07-27 ENCOUNTER — Telehealth: Payer: Self-pay | Admitting: Internal Medicine

## 2015-07-27 NOTE — Telephone Encounter (Signed)
New message      Pt states she is out of rhythm since yesterday.  She has taken and extra amiodarone yesterday. She is dizzy also.  Should she take another amiodarone?

## 2015-07-27 NOTE — Telephone Encounter (Signed)
I spoke with the patient. She states that on Friday (9/23) ~ 2 pm, she went into A-fib. She was out of rhythm about 3-4 hours and then converted back to NSR on her own. She felt nauseated on Saturday and had some GI upset, but was in NSR until about 2 pm on Sunday. She has been out of rhythm since that time. She states that she is on amiodarone 200 mg once daily, but took an extra 200 mg last night. She has taken 200 mg today. Her HR's are from 124-127 bpm, BP- ~85/51. She has been on other antiarrhythmics in the past and was told amiodarone was her last resort. She has been evaluated by Dr. Rayann Heman previously for A-fib ablation, but has been in a "wait and see" period. I have reviewed the above with Dr. Rayann Heman. He recommends that the patient take an extra dose of amiodarone 200 mg tonight. She should be NPO after midnight and then be set up for DCCV tomorrow. I attempted to call central scheduling this afternoon, but this was right at 5:00 pm and there was no answer. I have notified the patient of Dr. Jackalyn Lombard recommendations and she is aware one of the nurses will call her tomorrow to try to arrange DCCV for 9/27. Per Dr. Rayann Heman, the patient should continue amiodarone 200 mg BID and follow up with Roderic Palau, NP 7 days post DCCV. She verbalizes understanding and is agreeable.

## 2015-07-28 NOTE — Telephone Encounter (Signed)
Spoke with pt this morning she stated she flipped back into NSR in the night.  Pt state she feels well this morning and has BP of 115/71 with HR or 67.  Spoke with Maximino Greenland, NP in A Fib clinic gave verbal orders for pt to continue taking 200 mg of Amiodarone BID and schedule appt with pt in 1 week.  Pt made aware of medication change and scheduled in A. Fib clinic on 10/4 @ 1:30pm.  Pt verbalized understanding no additional questions at this time.

## 2015-08-04 ENCOUNTER — Ambulatory Visit (HOSPITAL_COMMUNITY)
Admission: RE | Admit: 2015-08-04 | Discharge: 2015-08-04 | Disposition: A | Discharge status: Home / Self Care | Payer: PPO | Source: Ambulatory Visit | Attending: Nurse Practitioner | Admitting: Nurse Practitioner

## 2015-08-04 ENCOUNTER — Encounter (HOSPITAL_COMMUNITY): Payer: Self-pay | Admitting: Nurse Practitioner

## 2015-08-04 VITALS — BP 114/54 | HR 66 | Ht 63.0 in | Wt 118.6 lb

## 2015-08-04 DIAGNOSIS — I4891 Unspecified atrial fibrillation: Secondary | ICD-10-CM | POA: Insufficient documentation

## 2015-08-04 DIAGNOSIS — G4730 Sleep apnea, unspecified: Secondary | ICD-10-CM | POA: Insufficient documentation

## 2015-08-04 DIAGNOSIS — E785 Hyperlipidemia, unspecified: Secondary | ICD-10-CM | POA: Diagnosis not present

## 2015-08-04 DIAGNOSIS — Z8673 Personal history of transient ischemic attack (TIA), and cerebral infarction without residual deficits: Secondary | ICD-10-CM | POA: Insufficient documentation

## 2015-08-04 DIAGNOSIS — M810 Age-related osteoporosis without current pathological fracture: Secondary | ICD-10-CM | POA: Insufficient documentation

## 2015-08-04 DIAGNOSIS — Z79899 Other long term (current) drug therapy: Secondary | ICD-10-CM | POA: Diagnosis not present

## 2015-08-04 DIAGNOSIS — Z7952 Long term (current) use of systemic steroids: Secondary | ICD-10-CM | POA: Diagnosis not present

## 2015-08-04 DIAGNOSIS — I4819 Other persistent atrial fibrillation: Secondary | ICD-10-CM

## 2015-08-04 DIAGNOSIS — I10 Essential (primary) hypertension: Secondary | ICD-10-CM | POA: Diagnosis not present

## 2015-08-04 DIAGNOSIS — E2740 Unspecified adrenocortical insufficiency: Secondary | ICD-10-CM | POA: Insufficient documentation

## 2015-08-04 DIAGNOSIS — K219 Gastro-esophageal reflux disease without esophagitis: Secondary | ICD-10-CM | POA: Diagnosis not present

## 2015-08-04 DIAGNOSIS — Z823 Family history of stroke: Secondary | ICD-10-CM | POA: Diagnosis not present

## 2015-08-04 DIAGNOSIS — G4700 Insomnia, unspecified: Secondary | ICD-10-CM | POA: Diagnosis not present

## 2015-08-04 DIAGNOSIS — I481 Persistent atrial fibrillation: Secondary | ICD-10-CM | POA: Diagnosis not present

## 2015-08-04 DIAGNOSIS — Z7902 Long term (current) use of antithrombotics/antiplatelets: Secondary | ICD-10-CM | POA: Diagnosis not present

## 2015-08-04 DIAGNOSIS — M353 Polymyalgia rheumatica: Secondary | ICD-10-CM | POA: Diagnosis not present

## 2015-08-04 DIAGNOSIS — Z7983 Long term (current) use of bisphosphonates: Secondary | ICD-10-CM | POA: Insufficient documentation

## 2015-08-04 DIAGNOSIS — Z8249 Family history of ischemic heart disease and other diseases of the circulatory system: Secondary | ICD-10-CM | POA: Insufficient documentation

## 2015-08-04 LAB — HEPATIC FUNCTION PANEL
ALT: 23 U/L (ref 14–54)
AST: 34 U/L (ref 15–41)
Albumin: 3.9 g/dL (ref 3.5–5.0)
Alkaline Phosphatase: 72 U/L (ref 38–126)
Bilirubin, Direct: 0.1 mg/dL (ref 0.1–0.5)
Indirect Bilirubin: 0.8 mg/dL (ref 0.3–0.9)
Total Bilirubin: 0.9 mg/dL (ref 0.3–1.2)
Total Protein: 6.3 g/dL — ABNORMAL LOW (ref 6.5–8.1)

## 2015-08-04 LAB — TSH: TSH: 0.671 u[IU]/mL (ref 0.350–4.500)

## 2015-08-04 MED ORDER — AMIODARONE HCL 200 MG PO TABS: 200.0000 mg | ORAL_TABLET | Freq: Every day | ORAL | Status: DC

## 2015-08-04 NOTE — Progress Notes (Addendum)
Patient ID: Lorraine Little, female   DOB: 10-May-1941, 74 y.o.   MRN: [id number]    Primary Care Physician: Lorraine Ly, MD Referring Physician:  Dr. Waverly Little Lorraine Little is a 74 y.o. female with a h/o persistent afib that is for f/u in the afib clinic. She reported that she went out of rhythm week end before last and was persistent for several days. Amiodarone was increased to 200 mg bid and DCCV was planned. She went back into SR before DCCV. She was instructed to stay on amiodarone at 200 mg bid dose for one week. She reports no further afib episodes over the last week. Will reduce dose back to 200 mg a day.  Today, she denies symptoms of palpitations, chest pain, shortness of breath, orthopnea, PND, lower extremity edema, dizziness, presyncope, syncope, or neurologic sequela. The patient is tolerating medications without difficulties and is otherwise without complaint today.   Past Medical History  Diagnosis Date  . Polymyalgia rheumatica (Lorraine Little)     a. On chronic steroids  . Hypertension   . GERD (gastroesophageal reflux disease)     pt denies symptoms with this  . Migraine headache   . Cervical disc disease   . Osteoporosis   . Glaucoma   . Osteoarthritis   . Persistent atrial fibrillation (Lorraine Little)     a.  Diagnosed 07/2011;  b. Normal nuclear myoview 07/2011 and normal EF at that time;  c. Most recent echo 2/20 /13 - EF 55-60% wit mild-mod MR.;  d. s/p DCCV 01/12/2012  . Hyperlipidemia     a. Diagnosed 12/2011  . CVA (cerebral infarction)     a. 11/2011  . Diverticulosis   . Internal hemorrhoid   . Tubular adenoma of colon 04/2007  . Adrenal insufficiency (HCC)     takes prednisone 5mg  daily  . Depression   . Insomnia with sleep apnea    Past Surgical History  Procedure Laterality Date  . Breast lumpectomy  1980's    right  x2 (benign)  . Tonsillectomy  age 55  . Cardioversion  01/12/2012    Procedure: CARDIOVERSION;  Surgeon: Lorraine Perla, MD;  Location: [facility];   Service: Cardiovascular;  Laterality: N/A;  . Cardioversion  03/28/2012    Procedure: CARDIOVERSION;  Surgeon: Lorraine Dresser, MD;  Location: [facility];  Service: Cardiovascular;  Laterality: N/A;  . Cardioversion  06/29/2012    Procedure: CARDIOVERSION;  Surgeon: Lorraine Coco, MD;  Location: [facility] ENDOSCOPY;  Service: Cardiovascular;  Laterality: N/A;  . Glaucoma surgery      Current Outpatient Prescriptions  Medication Sig Dispense Refill  . alendronate (FOSAMAX) 70 MG tablet Take 70 mg by mouth every 7 (seven) days. Take with a full glass of water on an empty stomach.    Marland Kitchen amiodarone (PACERONE) 200 MG tablet Take 1 tablet (200 mg total) by mouth daily. 60 tablet 6  . amLODipine (NORVASC) 5 MG tablet Take 1 tablet (5 mg total) by mouth daily. 30 tablet 6  . atorvastatin (LIPITOR) 80 MG tablet TAKE 1 TABLET BY MOUTH EVERY DAY 30 tablet 6  . Calcium-Vitamin D 600-200 MG-UNIT per tablet Take 1 tablet by mouth daily.     . Cholecalciferol (VITAMIN D-3) 5000 UNITS TABS Take 1 tablet by mouth daily. 1 tab daily    . Coenzyme Q10 (CO Q 10 PO) Take 1 tablet by mouth daily.    Marland Kitchen FIBER PO Take 1 tablet by mouth daily.    Marland Kitchen  FINACEA 15 % cream     . fish oil-omega-3 fatty acids 1000 MG capsule Take 1 g by mouth 2 (two) times daily.     Marland Kitchen FOLIC ACID PO Take 1 tablet by mouth daily.     Marland Kitchen gabapentin (NEURONTIN) 100 MG capsule TAKE ONE CAPSULE BY MOUTH IN THE MORNING ,1 TABLET MID AFTERNOON & 2 TABLETS AT BEDTIME 120 capsule 6  . Melatonin 5 MG TABS Take 2.5 mg by mouth at bedtime.    Marland Kitchen omeprazole (PRILOSEC) 20 MG capsule Take 1 capsule (20 mg total) by mouth daily. 90 capsule 3  . polyethylene glycol (MIRALAX / GLYCOLAX) packet Take 17 g by mouth daily.    . predniSONE (DELTASONE) 1 MG tablet Take 3 mg by mouth daily with breakfast.    . rivaroxaban (XARELTO) 20 MG TABS tablet Take 20 mg by mouth daily with supper.    . Sennosides (SENNA LAX PO) Take 2 tablets by mouth at bedtime as needed. For  constipation    . valsartan-hydrochlorothiazide (DIOVAN-HCT) 320-25 MG per tablet Take 1 tablet by mouth daily.    [name] Sodium (STOOL SOFTENER PLUS PO) Take 1 tablet by mouth daily.     . ferrous sulfate 325 (65 FE) MG tablet Take 325 mg by mouth daily with breakfast.    . [DISCONTINUED] flecainide (TAMBOCOR) 100 MG tablet Take 100mg  1 tablet prn onset of atrial fib. 30 tablet 6   No current facility-administered medications for this encounter.    No Known Allergies  Social History   Social History  . Marital Status: Married    Spouse Name: Lorraine Little  . Number of Children: 3  . Years of Education: college   Occupational History  . retired      Pharmacist, hospital , [place] History Main Topics  . Smoking status: Never Smoker   . Smokeless tobacco: Never Used  . Alcohol Use: 0.6 oz/week    1 Glasses of wine per week     Comment: rare  . Drug Use: No  . Sexual Activity: Not Currently   Other Topics Concern  . Not on file   Social History Narrative   Pt lives in [place] with spouse.  Lorraine Little)  She was previously a Education officer, museum.    Caffeine- None   Right handed   Patient has three children.   Patient has a college education.    Family History  Problem Relation Age of Onset  . Stroke Mother     Deceased @ 39  . Heart failure Mother     died from  . Liver cancer Father     Deceased @ 64  . Colon cancer Paternal Grandmother     ROS- All systems are reviewed and negative except as per the HPI above  Physical Exam: Filed Vitals:   08/04/15 1341  BP: 114/54  Pulse: 66  Height: 5\' 3"  (1.6 m)  Weight: 118 lb 9.6 oz (53.797 kg)    GEN- The patient is well appearing, alert and oriented x 3 today.   Head- normocephalic, atraumatic Eyes-  Sclera clear, conjunctiva pink Ears- hearing intact Oropharynx- clear Neck- supple, no JVP Lymph- no cervical lymphadenopathy Lungs- Clear to ausculation bilaterally, normal work of breathing Heart- Regular rate  and rhythm, no murmurs, rubs or gallops, PMI not laterally displaced GI- soft, NT, ND, + BS Extremities- no clubbing, cyanosis, or edema MS- no significant deformity or atrophy Skin- no rash or lesion Psych- euthymic mood, full affect Neuro-  strength and sensation are intact  EKG- SR with first degree AV block. 66 bpm, pr int 226 ms, qrs int 84 ms, qtc int 440 ms  Assessment and Plan: 1. afib Now back in SR Decrease amiodarone to 200 mg qd Continue xarelto Tsh, liver panel today  F/u with Dr. Caryl Comes in 6 months

## 2015-08-04 NOTE — Patient Instructions (Signed)
Your physician wants you to follow-up in: 6 months with Dr. Caryl Comes. You will receive a reminder letter in the mail two months in advance. If you don't receive a letter, please call our office to schedule the follow-up appointment.   Your physician has recommended you make the following change in your medication:  1)decrease amiodarone to 200mg  once a day

## 2015-08-12 ENCOUNTER — Telehealth: Payer: Self-pay | Admitting: Neurology

## 2015-08-12 NOTE — Telephone Encounter (Signed)
Pt called and is not wanting to have memory test done. She asked that her appt time be cut in half. This operator wanted to let nurse and physician know and wanted to know if that would be ok. Pt would like a call back about her appt status. Thank you

## 2015-08-12 NOTE — Telephone Encounter (Signed)
Pt has osa on cpap and insomnia, along with many other medical problems. This visit really needs to be a 30 minute visit, even without the memory test. We schedule almost all of our patients for 30 minute appts. 15 minute appts are for uncomplicated cpap follow ups.  I called pt to advise her of this, left a message asking her to call me back.

## 2015-08-12 NOTE — Telephone Encounter (Signed)
Pt called back and was advised that she should keep her 30 min appt per Dr. Brett Fairy. Pt expressed understanding

## 2015-08-20 ENCOUNTER — Ambulatory Visit (INDEPENDENT_AMBULATORY_CARE_PROVIDER_SITE_OTHER): Payer: PPO | Admitting: Neurology

## 2015-08-20 ENCOUNTER — Encounter: Payer: Self-pay | Admitting: Neurology

## 2015-08-20 VITALS — BP 122/56 | HR 78 | Resp 20 | Ht 62.5 in | Wt 120.0 lb

## 2015-08-20 DIAGNOSIS — Z9114 Patient's other noncompliance with medication regimen: Secondary | ICD-10-CM | POA: Diagnosis not present

## 2015-08-20 DIAGNOSIS — G4733 Obstructive sleep apnea (adult) (pediatric): Secondary | ICD-10-CM

## 2015-08-20 DIAGNOSIS — G4700 Insomnia, unspecified: Secondary | ICD-10-CM

## 2015-08-20 NOTE — Progress Notes (Signed)
[facility]  Provider:  Larey Seat, M D  Referring Provider: Crist Infante, MD Primary Care Physician:  Jerlyn Ly, MD  Chief Complaint  Patient presents with  . Follow-up    stopped using cpap because she didnt like the nasal mask, refused memory exam at this time, rm 11, alone    HPI:  Lorraine Little is a 74 y.o., caucasian, right handed female.   She  is seen here as a revisit  from Dr. Joylene Draft for evaluation of sleep apnea with insomnia on CPAP . Dr. Clydene Fake has seen this patient on  [facility].    Lorraine Little an established patient in the [place] and is presenting here today with a complaint of intermittent insomnia. She can not even sleep on vacation [place]. This has been a problem for several years.   Her primary care physician [name] has at times prescribed sleep aids,  but felt that it was reasonable to evaluate her sleep further before continuing to prescribe sedatives. The sleep test was positive and the patient was started on CPAP- after initially struggling, she is now compliant and used CPAP with great regularity. The mask took some getting used to. She remains hoarse - see Assessment and Plan. I was able to obtain a new download10-8-15 from [facility] it  Showed an AHI of 0.4 and a compliance of 97% over the last 30 days -with an average of 6 hours 43 minutes nightly he was.  The patient is now using a small  "nasal mask [place] "and the humidifier was inspected ; there was normal technological function confirmed -her CPAP machine works well .  The patient endorsed the  GDS  @one  point, fatigue severity @ 27 points. [name] underwent a sleep study on 11-27-13. AHI was 33.1 and her RDI was 33.1/hr. as well.   The lowest oxygen level was at 78% , 02 saturation was 12.6 minutes total time below 89% saturation. She had irregular heart rate and normal sinus rhythm throughout the diagnostic  study. There were no periodic limb movements noted. Based on the high AHI CPAP was initiated and the patient was only titrated to 5 cm with. She still had a very poor sleep efficiency but AHI was reduced to 0. She used a nasal pillow and an ESON  nasal mask.  Download was reviewed today. She brought me today at a download from her machine and it shows that she has used the machine 5 hours and 5 minutes nightly , a 97% compliance over 30 days.  The residual AHI of 0.4 is very good . having reached numerically excellent results she states that she is laying awake for hours before finally going to sleep !-  and that she actually feels no help in sleeping deeper or longer by the CPAP machine.The 95th percentile pressure was actually 9.8cm on AUTO-set titration at Home.  The minimum pressure was set at 5 the maximum pressure at 10 cm water and for 95% of the time the patient seems to use it at 9.8 cm water.  Again,  her main problem now is not the residual apnea that her insomnia. She had trouble sleeping for a long time before OSA was diagnosed and CPAP initiated. The CPAP has helped her headaches! Her problem is sleep initiation, not staying asleep. She has less palpiations. Continues on amiodarone , which can induce insomnia . She is tolerating it well,  but had some neuropathic changes.  Belsomra at 10 mg was felt too strong, to groggy making in AM.  I offered her 5 mg today.  I would like for her to consider the ablation therapy form atrial fibrillation, and to be able to d/c the amiodarone treatment.  She will discuss this step with Dr. Virl Axe , her cardiologist and electrophysiologist this afternoon. 08-14-14 .  Revisit on 03-20-15 Lorraine Little is here today and stated that she had a skin rash that she experienced back in February that matched the impact lines of her nasal CPAP. She discontinued the use of the CPAP at that time and had no chance to see me earlier than today to address this  issue. Her compliance is accordingly poor since the machine wasn't used. Her CPAP has worked wonders for the apnea before however she is now currently untreated. She has not responded well to the [place] for insomnia treatment and stated that she felt  "hung over "the next day. He went to the local factory and purchased melatonin which seems to help her better. Is today what kind of mask he could apply to make sure that the patient does not have the same impact lines again. She keeps the mask clean she did not share change any skin care , perfume, make up, etc.  etc. she usually rinses the mask just with water , not soap.  I think Lin[facility] Collin at [facility] for fitting her with different nasal masks. I'm not sure if she will experience another skin rash again if she returns to the CPAP impact I discussed with her the alternative of a dream wear mask, she will give it another try.   Revisit from 08-20-15, Lorraine Little is here today and reports that she has not used her CPAP for over 5 months. Her headaches however have not returned. She does not feel it had any impact on her sleep or lack of sleep. She has been treated with gabapentin and she thinks this has successfully controlled her headaches. Her fatigue severity score today is 31 points and her Epworth sleepiness score is 4 points the geriatric depression score is endorsed at one point. Overall she feels well. She never found an comfortable interface without any breakout or rash, and the nasal pillow dislodged frequently. This actually interrupted her sleep and she felt that this she got less sleep on CPAP than without. She effectively discontinued CPAP use. Her cardiologist suggested to use CPAP for better HTN control. She will have to make a decision.     Review of Systems: Out of a complete 14 system review, the patient complains of only the following symptoms, and all other reviewed systems are negative. Snoring, dry mouth, palpitations,  myalgia, INSOMNIA . Insomnia better controlled on melatonin.   She endorsed today the Epworth Sleepiness Scale at 4 points, the fatigue severity score of 31 points and the geriatric depression score at 1 point.  Social History   Social History  . Marital Status: Married    Spouse Name: [name]  . Number of Children: 3  . Years of Education: college   Occupational History  . retired      Pharmacist, hospital , [employer] History Main Topics  . Smoking status: Never Smoker   . Smokeless tobacco: Never Used  . Alcohol Use: 0.6 oz/week    1 Glasses of wine per week     Comment: rare  . Drug Use: No  . Sexual  Activity: Not Currently   Other Topics Concern  . Not on file   Social History Narrative   Pt lives in [place] with spouse.  [name])  She was previously a Education officer, museum.    Caffeine- None   Right handed   Patient has three children.   Patient has a college education.    Family History  Problem Relation Age of Onset  . Stroke Mother     Deceased @ 31  . Heart failure Mother     died from  . Liver cancer Father     Deceased @ 54  . Colon cancer Paternal Grandmother     Past Medical History  Diagnosis Date  . Polymyalgia rheumatica ([place])     a. On chronic steroids  . Hypertension   . GERD (gastroesophageal reflux disease)     pt denies symptoms with this  . Migraine headache   . Cervical disc disease   . Osteoporosis   . Glaucoma   . Osteoarthritis   . Persistent atrial fibrillation ([place])     a.  Diagnosed 07/2011;  b. Normal nuclear myoview 07/2011 and normal EF at that time;  c. Most recent echo 2/20 /13 - EF 55-60% wit mild-mod MR.;  d. s/p DCCV 01/12/2012  . Hyperlipidemia     a. Diagnosed 12/2011  . CVA (cerebral infarction)     a. 11/2011  . Diverticulosis   . Internal hemorrhoid   . Tubular adenoma of colon 04/2007  . Adrenal insufficiency (H[facility])     takes prednisone 5mg  daily  . Depression   . Insomnia with sleep apnea     Past Surgical History   Procedure Laterality Date  . Breast lumpectomy  1980's    right  x2 (benign)  . Tonsillectomy  age 68  . Cardioversion  01/12/2012    Procedure: CARDIOVERSION;  Surgeon: Lelon Perla, MD;  Location: [place];  Service: Cardiovascular;  Laterality: N/A;  . Cardioversion  03/28/2012    Procedure: CARDIOVERSION;  Surgeon: Larey Dresser, MD;  Location: [place];  Service: Cardiovascular;  Laterality: N/A;  . Cardioversion  06/29/2012    Procedure: CARDIOVERSION;  Surgeon: Darlin Coco, MD;  Location: [facility] ENDOSCOPY;  Service: Cardiovascular;  Laterality: N/A;  . Glaucoma surgery      Current Outpatient Prescriptions  Medication Sig Dispense Refill  . alendronate (FOSAMAX) 70 MG tablet Take 70 mg by mouth every 7 (seven) days. Take with a full glass of water on an empty stomach.    Marland Kitchen amiodarone (PACERONE) 200 MG tablet Take 1 tablet (200 mg total) by mouth daily. 60 tablet 6  . amLODipine (NORVASC) 5 MG tablet Take 1 tablet (5 mg total) by mouth daily. 30 tablet 6  . atorvastatin (LIPITOR) 80 MG tablet TAKE 1 TABLET BY MOUTH EVERY DAY 30 tablet 6  . Calcium-Vitamin D 600-200 MG-UNIT per tablet Take 1 tablet by mouth daily.     [name] Sodium (STOOL SOFTENER PLUS PO) Take 1 tablet by mouth daily.     . Cholecalciferol (VITAMIN D-3) 5000 UNITS TABS Take 1 tablet by mouth daily. 1 tab daily    . Coenzyme Q10 (CO Q 10 PO) Take 1 tablet by mouth daily.    . ferrous sulfate 325 (65 FE) MG tablet Take 325 mg by mouth daily with breakfast.    . FIBER PO Take 1 tablet by mouth daily.    Marland Kitchen FINACEA 15 % cream     . fish oil-omega-3 fatty  acids 1000 MG capsule Take 1 g by mouth 2 (two) times daily.     Marland Kitchen FOLIC ACID PO Take 1 tablet by mouth daily.     Marland Kitchen gabapentin (NEURONTIN) 100 MG capsule TAKE ONE CAPSULE BY MOUTH IN THE MORNING ,1 TABLET MID AFTERNOON & 2 TABLETS AT BEDTIME 120 capsule 6  . Melatonin 5 MG TABS Take 5 mg by mouth at bedtime.     Marland Kitchen omeprazole (PRILOSEC) 20 MG capsule  Take 1 capsule (20 mg total) by mouth daily. 90 capsule 3  . polyethylene glycol (MIRALAX / GLYCOLAX) packet Take 17 g by mouth daily.    . predniSONE (DELTASONE) 1 MG tablet Take 3 mg by mouth daily with breakfast.    . rivaroxaban (XARELTO) 20 MG TABS tablet Take 20 mg by mouth daily with supper.    . Sennosides (SENNA LAX PO) Take 2 tablets by mouth at bedtime as needed. For constipation    . valsartan-hydrochlorothiazide (DIOVAN-HCT) 320-25 MG per tablet Take 1 tablet by mouth daily.    . [DISCONTINUED] flecainide (TAMBOCOR) 100 MG tablet Take 100mg  1 tablet prn onset of atrial fib. 30 tablet 6   No current facility-administered medications for this visit.    Allergies as of 08/20/2015  . (No Known Allergies)    Vitals: BP 122/56 mmHg  Pulse 78  Resp 20  Ht 5' 2.5" (1.588 m)  Wt 120 lb (54.432 kg)  BMI 21.59 kg/m2 Last Weight:  Wt Readings from Last 1 Encounters:  08/20/15 120 lb (54.432 kg)   Last Height:   Ht Readings from Last 1 Encounters:  08/20/15 5' 2.5" (1.588 m)    Physical exam:  General: The patient is awake, alert and appears not in acute distress. The patient is very well groomed. Head: Normocephalic, atraumatic. Neck is supple. Mallampati 2, neck circumference: 14 inches . No retrognathia.  Cardiovascular:  Regular rate and rhythm , without  murmurs or carotid bruit, and without distended neck veins. Respiratory: Lungs are clear to auscultation. Skin:  Without evidence of edema, there is a nasal and peri-nasal Rash. Trunk: BMI is normal . Neurologic exam : The patient is awake and alert, oriented to place and time.  Memory subjective  described as intact.  There is a normal attention span & concentration ability. The patient is articulate and cooperative.  Speech is fluent without dysarthria, she has dysphonia - chronic hoarseness .  Mood and affect are concerned/ worried. Cranial nerves: Pupils are equal and briskly reactive to light.  Facial motor  strength is symmetric and tongue and uvula move midline. Motor exam:   Normal tone and normal muscle bulk and symmetric normal strength in all extremities. Coordination: Rapid alternating movements in the fingers/hands is  normal.Gait and station: Patient walks without assistive device. Deep tendon reflexes: in the upper and lower extremities are symmetric and intact.    Assessment:  After physical and neurologic examination, review of laboratory studies, imaging, neurophysiology testing and pre-existing records,  15 minute assessment with more than 50% of the face to face time dedicated to discussion of    1) OSA risk factors,  Her non compliance can increase the risk of conditions such as CVA, atrial Fibrillation, HTN .  2) insomnia- Unfortunately the CPAP use never helped her to achieve longer or deeper sleep. She relies on a mixture of prescription or over-the-counter medications. She had some relief with melatonin which is not habit-forming 3) confusion, desorientation, forgetfullness:"  None recently "  PLAN: Lorraine Little will discuss with her cardiologist, Dr. Virl Axe, if he feels the need for her to reinitiate CPAP use. Strictly from a sleep quality standpoint improvement was marginal. She is invited to follow up when necessary or every 12 month. She wants to  address Memory deficits, should these progress , with her PCP,  Dr Joylene Draft.    Cc Dr Joylene Draft. Dr Virl Axe.

## 2015-10-17 ENCOUNTER — Other Ambulatory Visit: Payer: Self-pay | Admitting: Internal Medicine

## 2015-10-20 ENCOUNTER — Other Ambulatory Visit: Payer: Self-pay | Admitting: Internal Medicine

## 2015-11-08 ENCOUNTER — Other Ambulatory Visit: Payer: Self-pay | Admitting: Internal Medicine

## 2015-11-20 ENCOUNTER — Telehealth (HOSPITAL_COMMUNITY): Payer: Self-pay | Admitting: *Deleted

## 2015-11-20 NOTE — Telephone Encounter (Signed)
Patient called in stating she went into afib on Tuesday evening.  Doesn't really feel bad but can tell decreasing in energy and shortness of breath with exertion. States she increased her amiodarone to 200mg  BID on Wednesday and Thursday but hasn't noticed much difference with doing this.  Had patient take BP while on phone it was 100/66 HR 113 -- discussed with Roderic Palau NP -- told to continue Amiodarone 200mg  BID until Monday and call back in Monday morning with report of how she is doing.  Cannot really add any medications with soft blood pressure was explained to patient.  Instructions of when to report to ER over weekend were discussed and understood. Patient will call back on Monday to report how she is feeling.

## 2015-11-23 ENCOUNTER — Telehealth (HOSPITAL_COMMUNITY): Payer: Self-pay | Admitting: *Deleted

## 2015-11-23 NOTE — Telephone Encounter (Signed)
Patient called back stating she converted back into NSR on Sunday evening.  HR is 70 and regular today feeling a lot better. Discussed with Roderic Palau NP will keep Amiodarone to 200mg  BID until tomorrow 1/24 then go back to normal dose of Amiodarone of 200mg  once daily. Patient verbalized understanding and will call if further issues.

## 2015-11-30 ENCOUNTER — Ambulatory Visit (HOSPITAL_COMMUNITY)
Admission: RE | Admit: 2015-11-30 | Discharge: 2015-11-30 | Disposition: A | Discharge status: Home / Self Care | Payer: PPO | Source: Ambulatory Visit | Attending: Nurse Practitioner | Admitting: Nurse Practitioner

## 2015-11-30 VITALS — BP 140/78 | HR 97 | Ht 62.0 in | Wt 121.6 lb

## 2015-11-30 DIAGNOSIS — I481 Persistent atrial fibrillation: Secondary | ICD-10-CM | POA: Diagnosis not present

## 2015-11-30 DIAGNOSIS — I4819 Other persistent atrial fibrillation: Secondary | ICD-10-CM

## 2015-11-30 MED ORDER — METOPROLOL SUCCINATE ER 25 MG PO TB24: 12.5000 mg | ORAL_TABLET | Freq: Every day | ORAL | Status: DC

## 2015-11-30 NOTE — Patient Instructions (Signed)
Your physician has recommended you make the following change in your medication:  1)Metoprolol -- take 1/2 tablet once a day, may take extra 1/2 tablet for fast afib HR greater than 100

## 2015-12-01 ENCOUNTER — Encounter (HOSPITAL_COMMUNITY): Payer: Self-pay | Admitting: Nurse Practitioner

## 2015-12-01 NOTE — Progress Notes (Signed)
Patient ID: Lorraine Little, female   DOB: 01-10-1941, 75 y.o.   MRN: [id number]    Primary Care Physician: Jerlyn Ly, MD Referring Physician:  Dr. Waverly Ferrari Lorraine Little is a 75 y.o. female with a h/o persistent afib that is for f/u in the afib clinic. She is on amiodarone. She has had more persistent afib over the last few months. She can usually increase amiodarone to 200 mg bid and the afib will resolve in a couple of days. She did this recently but did not return to NSR. She is in the afib clinic today with Afib at 97 bpm. She has been considered for ablation in the past but the pt decided against it. Qt intervals look on the long side when viewing EKG's from the past so Tikosyn may not be a good option for pt. She does feel more fatigue when in afib.  Today, she denies symptoms of   chest pain, shortness of breath, orthopnea, PND, lower extremity edema, dizziness, presyncope, syncope, or neurologic sequela. Positive for fatigue. The patient is tolerating medications without difficulties and is otherwise without complaint today.   Past Medical History  Diagnosis Date  . Polymyalgia rheumatica ([place])     a. On chronic steroids  . Hypertension   . GERD (gastroesophageal reflux disease)     pt denies symptoms with this  . Migraine headache   . Cervical disc disease   . Osteoporosis   . Glaucoma   . Osteoarthritis   . Persistent atrial fibrillation ([place])     a.  Diagnosed 07/2011;  b. Normal nuclear myoview 07/2011 and normal EF at that time;  c. Most recent echo 2/20 /13 - EF 55-60% wit mild-mod MR.;  d. s/p DCCV 01/12/2012  . Hyperlipidemia     a. Diagnosed 12/2011  . CVA (cerebral infarction)     a. 11/2011  . Diverticulosis   . Internal hemorrhoid   . Tubular adenoma of colon 04/2007  . Adrenal insufficiency ([facility])     takes prednisone 5mg  daily  . Depression   . Insomnia with sleep apnea    Past Surgical History  Procedure Laterality Date  . Breast lumpectomy  1980's   right  x2 (benign)  . Tonsillectomy  age 10  . Cardioversion  01/12/2012    Procedure: CARDIOVERSION;  Surgeon: Lelon Perla, MD;  Location: [place];  Service: Cardiovascular;  Laterality: N/A;  . Cardioversion  03/28/2012    Procedure: CARDIOVERSION;  Surgeon: Larey Dresser, MD;  Location: [place];  Service: Cardiovascular;  Laterality: N/A;  . Cardioversion  06/29/2012    Procedure: CARDIOVERSION;  Surgeon: Darlin Coco, MD;  Location: [facility] ENDOSCOPY;  Service: Cardiovascular;  Laterality: N/A;  . Glaucoma surgery      Current Outpatient Prescriptions  Medication Sig Dispense Refill  . alendronate (FOSAMAX) 70 MG tablet Take 70 mg by mouth every 7 (seven) days. Take with a full glass of water on an empty stomach.    Marland Kitchen amiodarone (PACERONE) 200 MG tablet Take 1 tablet (200 mg total) by mouth daily. 60 tablet 6  . amLODipine (NORVASC) 5 MG tablet TAKE 1 TABLET (5 MG TOTAL) BY MOUTH DAILY. 30 tablet 6  . atorvastatin (LIPITOR) 80 MG tablet TAKE 1 TABLET BY MOUTH EVERY DAY 30 tablet 10  . Calcium-Vitamin D 600-200 MG-UNIT per tablet Take 1 tablet by mouth daily.     [name] Sodium (STOOL SOFTENER PLUS PO) Take 1 tablet by mouth daily.     Marland Kitchen  Cholecalciferol (VITAMIN D-3) 5000 UNITS TABS Take 1 tablet by mouth daily. 1 tab daily    . Coenzyme Q10 (CO Q 10 PO) Take 1 tablet by mouth daily.    . ferrous sulfate 325 (65 FE) MG tablet Take 325 mg by mouth daily with breakfast.    . FIBER PO Take 1 tablet by mouth daily.    Marland Kitchen FINACEA 15 % cream     . fish oil-omega-3 fatty acids 1000 MG capsule Take 1 g by mouth 2 (two) times daily.     Marland Kitchen FOLIC ACID PO Take 1 tablet by mouth daily.     Marland Kitchen gabapentin (NEURONTIN) 100 MG capsule TAKE ONE CAPSULE BY MOUTH IN THE MORNING ,1 TABLET MID AFTERNOON & 2 TABLETS AT BEDTIME 120 capsule 6  . Melatonin 5 MG TABS Take 5 mg by mouth at bedtime.     Marland Kitchen omeprazole (PRILOSEC) 20 MG capsule Take 1 capsule (20 mg total) by mouth daily. 90 capsule 3  .  polyethylene glycol (MIRALAX / GLYCOLAX) packet Take 17 g by mouth daily.    . predniSONE (DELTASONE) 1 MG tablet Take 3 mg by mouth daily with breakfast.    . rivaroxaban (XARELTO) 20 MG TABS tablet Take 20 mg by mouth daily with supper.    . Sennosides (SENNA LAX PO) Take 2 tablets by mouth at bedtime as needed. For constipation    . valsartan-hydrochlorothiazide (DIOVAN-HCT) 320-25 MG per tablet Take 1 tablet by mouth daily.    Alveda Reasons 20 MG TABS tablet TAKE 1 TABLET BY MOUTH AT BEDTIME 30 tablet 5  . metoprolol succinate (TOPROL XL) 25 MG 24 hr tablet Take 0.5 tablets (12.5 mg total) by mouth daily. May take extra 1/2 tablet for afib heart rate >100 30 tablet 1  . [DISCONTINUED] flecainide (TAMBOCOR) 100 MG tablet Take 100mg  1 tablet prn onset of atrial fib. 30 tablet 6   No current facility-administered medications for this encounter.    No Known Allergies  Social History   Social History  . Marital Status: Married    Spouse Name: [name]  . Number of Children: 3  . Years of Education: college   Occupational History  . retired      Pharmacist, hospital , [place] History Main Topics  . Smoking status: Never Smoker   . Smokeless tobacco: Never Used  . Alcohol Use: 0.6 oz/week    1 Glasses of wine per week     Comment: rare  . Drug Use: No  . Sexual Activity: Not Currently   Other Topics Concern  . Not on file   Social History Narrative   Pt lives in [place] with spouse.  [name])  She was previously a Education officer, museum.    Caffeine- None   Right handed   Patient has three children.   Patient has a college education.    Family History  Problem Relation Age of Onset  . Stroke Mother     Deceased @ 41  . Heart failure Mother     died from  . Liver cancer Father     Deceased @ 20  . Colon cancer Paternal Grandmother     ROS- All systems are reviewed and negative except as per the HPI above  Physical Exam: Filed Vitals:   11/30/15 1419  BP: 140/78  Pulse: 97    Height: 5\' 2"  (1.575 m)  Weight: 121 lb 9.6 oz (55.157 kg)    GEN- The patient is well appearing,  alert and oriented x 3 today.   Head- normocephalic, atraumatic Eyes-  Sclera clear, conjunctiva pink Ears- hearing intact Oropharynx- clear Neck- supple, no JVP Lymph- no cervical lymphadenopathy Lungs- Clear to ausculation bilaterally, normal work of breathing Heart- Irregular rate and rhythm, no murmurs, rubs or gallops, PMI not laterally displaced GI- soft, NT, ND, + BS Extremities- no clubbing, cyanosis, or edema MS- no significant deformity or atrophy Skin- no rash or lesion Psych- euthymic mood, full affect Neuro- strength and sensation are intact  EKG-  Afib  At 97 bpm, st/t wave abnormality, QRS int 86 ms, QTc 528 ms  Assessment and Plan: 1. afib Persistent afib over the last few days Amiodarone increase to bid did not seem to help restore rhythm  Return to amiodarone 200 mg a day Add metoprolol ER 25 mg , 1/2 tab a day to see if can help promote SR/slow v response Continue xarelto  F/u Friday in afib clinic  [place] [place], [facility] [address] [place], [place] [address] [phone]

## 2015-12-03 ENCOUNTER — Other Ambulatory Visit (HOSPITAL_COMMUNITY): Payer: Self-pay | Admitting: *Deleted

## 2015-12-04 ENCOUNTER — Other Ambulatory Visit (HOSPITAL_COMMUNITY): Payer: Self-pay | Admitting: *Deleted

## 2015-12-04 ENCOUNTER — Ambulatory Visit (HOSPITAL_COMMUNITY)
Admission: RE | Admit: 2015-12-04 | Discharge: 2015-12-04 | Disposition: A | Discharge status: Home / Self Care | Payer: PPO | Source: Ambulatory Visit | Attending: Nurse Practitioner | Admitting: Nurse Practitioner

## 2015-12-04 DIAGNOSIS — I6340 Cerebral infarction due to embolism of unspecified cerebral artery: Secondary | ICD-10-CM | POA: Insufficient documentation

## 2015-12-04 DIAGNOSIS — E785 Hyperlipidemia, unspecified: Secondary | ICD-10-CM | POA: Insufficient documentation

## 2015-12-04 DIAGNOSIS — R251 Tremor, unspecified: Secondary | ICD-10-CM | POA: Diagnosis not present

## 2015-12-04 DIAGNOSIS — I480 Paroxysmal atrial fibrillation: Secondary | ICD-10-CM | POA: Diagnosis not present

## 2015-12-04 DIAGNOSIS — M353 Polymyalgia rheumatica: Secondary | ICD-10-CM | POA: Insufficient documentation

## 2015-12-04 DIAGNOSIS — G4733 Obstructive sleep apnea (adult) (pediatric): Secondary | ICD-10-CM | POA: Diagnosis not present

## 2015-12-04 DIAGNOSIS — I4891 Unspecified atrial fibrillation: Secondary | ICD-10-CM | POA: Diagnosis not present

## 2015-12-04 DIAGNOSIS — Z0189 Encounter for other specified special examinations: Secondary | ICD-10-CM | POA: Diagnosis not present

## 2015-12-04 DIAGNOSIS — I10 Essential (primary) hypertension: Secondary | ICD-10-CM | POA: Diagnosis not present

## 2015-12-04 LAB — BASIC METABOLIC PANEL
Anion gap: 7 (ref 5–15)
BUN: 16 mg/dL (ref 6–20)
CHLORIDE: 97 mmol/L — AB (ref 101–111)
CO2: 33 mmol/L — ABNORMAL HIGH (ref 22–32)
CREATININE: 1.17 mg/dL — AB (ref 0.44–1.00)
Calcium: 9.4 mg/dL (ref 8.9–10.3)
GFR calc Af Amer: 51 mL/min — ABNORMAL LOW (ref >60)
GFR calc non Af Amer: 44 mL/min — ABNORMAL LOW (ref >60)
GLUCOSE: 99 mg/dL (ref 65–99)
Potassium: 3.2 mmol/L — ABNORMAL LOW (ref 3.5–5.1)
SODIUM: 137 mmol/L (ref 135–145)

## 2015-12-04 LAB — CBC
HCT: 40.4 % (ref 36.0–46.0)
HEMOGLOBIN: 13.7 g/dL (ref 12.0–15.0)
MCH: 31.9 pg (ref 26.0–34.0)
MCHC: 33.9 g/dL (ref 30.0–36.0)
MCV: 94 fL (ref 78.0–100.0)
Platelets: 245 10*3/uL (ref 150–400)
RBC: 4.3 MIL/uL (ref 3.87–5.11)
RDW: 13.8 % (ref 11.5–15.5)
WBC: 5.8 10*3/uL (ref 4.0–10.5)

## 2015-12-04 MED ORDER — POTASSIUM CHLORIDE ER 20 MEQ PO TBCR: 20.0000 meq | EXTENDED_RELEASE_TABLET | Freq: Every day | ORAL | Status: DC

## 2015-12-04 MED ORDER — METOPROLOL SUCCINATE ER 25 MG PO TB24: ORAL_TABLET | ORAL | Status: DC

## 2015-12-04 NOTE — Patient Instructions (Signed)
Cardioversion scheduled for Monday, February 6th  - Arrive at the [facility] and go to admitting at 12PM  -Do not eat or drink anything after midnight the night prior to your procedure.  - Take all your medication with a sip of water prior to arrival.  - You will not be able to drive home after your procedure.

## 2015-12-04 NOTE — Progress Notes (Signed)
Pt reports still in afib would prefer to go ahead with cardioversion since metoprolol has not affected her HR and has required cardioversions in the past. Roderic Palau NP recommended stopping metoprolol daily and use only PRN since not converting to NSR. Labs drawn for DCCV and scheduled for 2/6 @ 1pm.

## 2015-12-07 ENCOUNTER — Encounter (HOSPITAL_COMMUNITY): Payer: Self-pay | Admitting: *Deleted

## 2015-12-07 ENCOUNTER — Ambulatory Visit (HOSPITAL_BASED_OUTPATIENT_CLINIC_OR_DEPARTMENT_OTHER)
Admission: RE | Admit: 2015-12-07 | Discharge: 2015-12-07 | Disposition: A | Discharge status: Home / Self Care | Payer: PPO | Source: Ambulatory Visit | Attending: Nurse Practitioner | Admitting: Nurse Practitioner

## 2015-12-07 ENCOUNTER — Encounter (HOSPITAL_COMMUNITY)
Admission: RE | Disposition: A | Discharge status: Home / Self Care | Payer: Self-pay | Source: Ambulatory Visit | Attending: Cardiovascular Disease

## 2015-12-07 ENCOUNTER — Ambulatory Visit (HOSPITAL_COMMUNITY): Payer: PPO | Admitting: Anesthesiology

## 2015-12-07 ENCOUNTER — Ambulatory Visit (HOSPITAL_COMMUNITY)
Admission: RE | Admit: 2015-12-07 | Discharge: 2015-12-07 | Disposition: A | Discharge status: Home / Self Care | Payer: PPO | Source: Ambulatory Visit | Attending: Cardiovascular Disease | Admitting: Cardiovascular Disease

## 2015-12-07 DIAGNOSIS — G4700 Insomnia, unspecified: Secondary | ICD-10-CM | POA: Insufficient documentation

## 2015-12-07 DIAGNOSIS — E785 Hyperlipidemia, unspecified: Secondary | ICD-10-CM | POA: Insufficient documentation

## 2015-12-07 DIAGNOSIS — Z7983 Long term (current) use of bisphosphonates: Secondary | ICD-10-CM | POA: Diagnosis not present

## 2015-12-07 DIAGNOSIS — M1990 Unspecified osteoarthritis, unspecified site: Secondary | ICD-10-CM | POA: Insufficient documentation

## 2015-12-07 DIAGNOSIS — Z7952 Long term (current) use of systemic steroids: Secondary | ICD-10-CM | POA: Diagnosis not present

## 2015-12-07 DIAGNOSIS — F329 Major depressive disorder, single episode, unspecified: Secondary | ICD-10-CM | POA: Diagnosis not present

## 2015-12-07 DIAGNOSIS — I10 Essential (primary) hypertension: Secondary | ICD-10-CM | POA: Diagnosis not present

## 2015-12-07 DIAGNOSIS — Z8673 Personal history of transient ischemic attack (TIA), and cerebral infarction without residual deficits: Secondary | ICD-10-CM | POA: Insufficient documentation

## 2015-12-07 DIAGNOSIS — G4730 Sleep apnea, unspecified: Secondary | ICD-10-CM | POA: Diagnosis not present

## 2015-12-07 DIAGNOSIS — I4891 Unspecified atrial fibrillation: Secondary | ICD-10-CM | POA: Diagnosis not present

## 2015-12-07 DIAGNOSIS — I480 Paroxysmal atrial fibrillation: Secondary | ICD-10-CM

## 2015-12-07 DIAGNOSIS — I481 Persistent atrial fibrillation: Secondary | ICD-10-CM | POA: Insufficient documentation

## 2015-12-07 DIAGNOSIS — Z7901 Long term (current) use of anticoagulants: Secondary | ICD-10-CM | POA: Diagnosis not present

## 2015-12-07 DIAGNOSIS — E2740 Unspecified adrenocortical insufficiency: Secondary | ICD-10-CM | POA: Insufficient documentation

## 2015-12-07 DIAGNOSIS — K219 Gastro-esophageal reflux disease without esophagitis: Secondary | ICD-10-CM | POA: Insufficient documentation

## 2015-12-07 DIAGNOSIS — M353 Polymyalgia rheumatica: Secondary | ICD-10-CM | POA: Diagnosis not present

## 2015-12-07 DIAGNOSIS — M810 Age-related osteoporosis without current pathological fracture: Secondary | ICD-10-CM | POA: Insufficient documentation

## 2015-12-07 DIAGNOSIS — Z79899 Other long term (current) drug therapy: Secondary | ICD-10-CM | POA: Diagnosis not present

## 2015-12-07 HISTORY — PX: CARDIOVERSION: SHX1299

## 2015-12-07 LAB — POCT I-STAT 4, (NA,K, GLUC, HGB,HCT)
Glucose, Bld: 110 mg/dL — ABNORMAL HIGH (ref 65–99)
HEMATOCRIT: 45 % (ref 36.0–46.0)
HEMOGLOBIN: 15.3 g/dL — AB (ref 12.0–15.0)
Potassium: 4.4 mmol/L (ref 3.5–5.1)
SODIUM: 137 mmol/L (ref 135–145)

## 2015-12-07 SURGERY — CARDIOVERSION
Anesthesia: Monitor Anesthesia Care

## 2015-12-07 MED ORDER — PROPOFOL 10 MG/ML IV BOLUS
INTRAVENOUS | Status: DC | PRN
  Administered 2015-12-07: 60 mg via INTRAVENOUS

## 2015-12-07 MED ORDER — SODIUM CHLORIDE 0.9 % IV SOLN
INTRAVENOUS | Status: DC | PRN
  Administered 2015-12-07: 14:00:00 via INTRAVENOUS

## 2015-12-07 MED ORDER — LIDOCAINE HCL (CARDIAC) 20 MG/ML IV SOLN
INTRAVENOUS | Status: DC | PRN
  Administered 2015-12-07: 40 mg via INTRATRACHEAL

## 2015-12-07 NOTE — Discharge Instructions (Signed)
Electrical Cardioversion, Care After °Refer to this sheet in the next few weeks. These instructions provide you with information on caring for yourself after your procedure. Your health care provider may also give you more specific instructions. Your treatment has been planned according to current medical practices, but problems sometimes occur. Call your health care provider if you have any problems or questions after your procedure. °WHAT TO EXPECT AFTER THE PROCEDURE °After your procedure, it is typical to have the following sensations: °· Some redness on the skin where the shocks were delivered. If this is tender, a sunburn lotion or hydrocortisone cream may help. °· Possible return of an abnormal heart rhythm within hours or days after the procedure. °HOME CARE INSTRUCTIONS °· Take medicines only as directed by your health care provider. Be sure you understand how and when to take your medicine. °· Learn how to feel your pulse and check it often. °· Limit your activity for 48 hours after the procedure or as directed by your health care provider. °· Avoid or minimize caffeine and other stimulants as directed by your health care provider. °SEEK MEDICAL CARE IF: °· You feel like your heart is beating too fast or your pulse is not regular. °· You have any questions about your medicines. °· You have bleeding that will not stop. °SEEK IMMEDIATE MEDICAL CARE IF: °· You are dizzy or feel faint. °· It is hard to breathe or you feel short of breath. °· There is a change in discomfort in your chest. °· Your speech is slurred or you have trouble moving an arm or leg on one side of your body. °· You get a serious muscle cramp that does not go away. °· Your fingers or toes turn cold or blue. °  °This information is not intended to replace advice given to you by your health care provider. Make sure you discuss any questions you have with your health care provider. °  °Document Released: 08/07/2013 Document Revised: 11/07/2014  Document Reviewed: 08/07/2013 °Elsevier Interactive Patient Education ©2016 [employer] ° °

## 2015-12-07 NOTE — Anesthesia Postprocedure Evaluation (Signed)
Anesthesia Post Note  Patient: Lorraine Little  Procedure(s) Performed: Procedure(s) (LRB): CARDIOVERSION (N/A)  Patient location during evaluation: PACU Anesthesia Type: MAC Level of consciousness: awake and alert Pain management: pain level controlled Vital Signs Assessment: post-procedure vital signs reviewed and stable Respiratory status: spontaneous breathing, nonlabored ventilation, respiratory function stable and patient connected to nasal cannula oxygen Cardiovascular status: stable and blood pressure returned to baseline Anesthetic complications: no    Last Vitals:  Filed Vitals:   12/07/15 1400 12/07/15 1410  BP: 103/45 109/48  Pulse: 66 66  Temp:    Resp: 18 15    Last Pain: There were no vitals filed for this visit.               Zenaida Deed

## 2015-12-07 NOTE — Anesthesia Procedure Notes (Signed)
Procedure Name: MAC Date/Time: 12/07/2015 1:50 PM Performed by: Trixie Deis A Pre-anesthesia Checklist: Patient identified, Emergency Drugs available, Suction available, Patient being monitored and Timeout performed Patient Re-evaluated:Patient Re-evaluated prior to inductionOxygen Delivery Method: Ambu bag Intubation Type: IV induction Dental Injury: Teeth and Oropharynx as per pre-operative assessment

## 2015-12-07 NOTE — Anesthesia Preprocedure Evaluation (Signed)
Anesthesia Evaluation  Patient identified by MRN, date of birth, ID band Patient awake    Reviewed: Allergy & Precautions, H&P , NPO status , Patient's Chart, lab work & pertinent test results, reviewed documented beta blocker date and time   History of Anesthesia Complications Negative for: history of anesthetic complications  Airway Mallampati: I  TM Distance: <3 FB Neck ROM: Limited    Dental  (+) Teeth Intact   Pulmonary sleep apnea ,    breath sounds clear to auscultation       Cardiovascular hypertension, Pt. on medications + dysrhythmias Atrial Fibrillation  Rhythm:Irregular Rate:Normal  Most recent echo 2/20 /13 - EF 55-60% wit mild-mod MR.;    Neuro/Psych  Headaches,  Neuromuscular disease CVA, No Residual Symptoms negative psych ROS   GI/Hepatic Neg liver ROS, GERD  Medicated and Controlled,  Endo/Other  negative endocrine ROS  Renal/GU negative Renal ROS  negative genitourinary   Musculoskeletal  (+) Arthritis ,   Abdominal (+)  Abdomen: soft. Bowel sounds: normal.  Peds negative pediatric ROS (+)  Hematology negative hematology ROS (+)   Anesthesia Other Findings   Reproductive/Obstetrics negative OB ROS                             Anesthesia Physical  Anesthesia Plan  ASA: III  Anesthesia Plan: MAC   Post-op Pain Management:    Induction: Intravenous  Airway Management Planned: Mask  Additional Equipment:   Intra-op Plan:   Post-operative Plan:   Informed Consent: I have reviewed the patients History and Physical, chart, labs and discussed the procedure including the risks, benefits and alternatives for the proposed anesthesia with the patient or authorized representative who has indicated his/her understanding and acceptance.     Plan Discussed with: CRNA and Surgeon  Anesthesia Plan Comments:         Anesthesia Quick Evaluation

## 2015-12-07 NOTE — H&P (View-Only) (Signed)
Patient ID: Lorraine Little, female   DOB: [birth year], 75 y.o.   MRN: [id number]    Primary Care Physician: Jerlyn Ly, MD Referring Physician:  Dr. Waverly Ferrari Lorraine Little is a 75 y.o. female with a h/o persistent afib that is for f/u in the afib clinic. She is on amiodarone. She has had more persistent afib over the last few months. She can usually increase amiodarone to 200 mg bid and the afib will resolve in a couple of days. She did this recently but did not return to NSR. She is in the afib clinic today with Afib at 97 bpm. She has been considered for ablation in the past but the pt decided against it. Qt intervals look on the long side when viewing EKG's from the past so Tikosyn may not be a good option for pt. She does feel more fatigue when in afib.  Today, she denies symptoms of   chest pain, shortness of breath, orthopnea, PND, lower extremity edema, dizziness, presyncope, syncope, or neurologic sequela. Positive for fatigue. The patient is tolerating medications without difficulties and is otherwise without complaint today.   Past Medical History  Diagnosis Date  . Polymyalgia rheumatica ([place])     a. On chronic steroids  . Hypertension   . GERD (gastroesophageal reflux disease)     pt denies symptoms with this  . Migraine headache   . Cervical disc disease   . Osteoporosis   . Glaucoma   . Osteoarthritis   . Persistent atrial fibrillation ([place])     a.  Diagnosed 07/2011;  b. Normal nuclear myoview 07/2011 and normal EF at that time;  c. Most recent echo 2/20 /13 - EF 55-60% wit mild-mod MR.;  d. s/p DCCV 01/12/2012  . Hyperlipidemia     a. Diagnosed 12/2011  . CVA (cerebral infarction)     a. 11/2011  . Diverticulosis   . Internal hemorrhoid   . Tubular adenoma of colon 04/2007  . Adrenal insufficiency ([facility])     takes prednisone 5mg  daily  . Depression   . Insomnia with sleep apnea    Past Surgical History  Procedure Laterality Date  . Breast lumpectomy  1980's   right  x2 (benign)  . Tonsillectomy  age 82  . Cardioversion  01/12/2012    Procedure: CARDIOVERSION;  Surgeon: Lelon Perla, MD;  Location: [facility] [facility];  Service: Cardiovascular;  Laterality: N/A;  . Cardioversion  03/28/2012    Procedure: CARDIOVERSION;  Surgeon: Larey Dresser, MD;  Location: [facility];  Service: Cardiovascular;  Laterality: N/A;  . Cardioversion  06/29/2012    Procedure: CARDIOVERSION;  Surgeon: Darlin Coco, MD;  Location: [facility] ENDOSCOPY;  Service: Cardiovascular;  Laterality: N/A;  . Glaucoma surgery      Current Outpatient Prescriptions  Medication Sig Dispense Refill  . alendronate (FOSAMAX) 70 MG tablet Take 70 mg by mouth every 7 (seven) days. Take with a full glass of water on an empty stomach.    Marland Kitchen amiodarone (PACERONE) 200 MG tablet Take 1 tablet (200 mg total) by mouth daily. 60 tablet 6  . amLODipine (NORVASC) 5 MG tablet TAKE 1 TABLET (5 MG TOTAL) BY MOUTH DAILY. 30 tablet 6  . atorvastatin (LIPITOR) 80 MG tablet TAKE 1 TABLET BY MOUTH EVERY DAY 30 tablet 10  . Calcium-Vitamin D 600-200 MG-UNIT per tablet Take 1 tablet by mouth daily.     [name] Sodium (STOOL SOFTENER PLUS PO) Take 1 tablet by mouth daily.     Marland Kitchen  Cholecalciferol (VITAMIN D-3) 5000 UNITS TABS Take 1 tablet by mouth daily. 1 tab daily    . Coenzyme Q10 (CO Q 10 PO) Take 1 tablet by mouth daily.    . ferrous sulfate 325 (65 FE) MG tablet Take 325 mg by mouth daily with breakfast.    . FIBER PO Take 1 tablet by mouth daily.    Marland Kitchen FINACEA 15 % cream     . fish oil-omega-3 fatty acids 1000 MG capsule Take 1 g by mouth 2 (two) times daily.     Marland Kitchen FOLIC ACID PO Take 1 tablet by mouth daily.     Marland Kitchen gabapentin (NEURONTIN) 100 MG capsule TAKE ONE CAPSULE BY MOUTH IN THE MORNING ,1 TABLET MID AFTERNOON & 2 TABLETS AT BEDTIME 120 capsule 6  . Melatonin 5 MG TABS Take 5 mg by mouth at bedtime.     Marland Kitchen omeprazole (PRILOSEC) 20 MG capsule Take 1 capsule (20 mg total) by mouth daily. 90 capsule 3  .  polyethylene glycol (MIRALAX / GLYCOLAX) packet Take 17 g by mouth daily.    . predniSONE (DELTASONE) 1 MG tablet Take 3 mg by mouth daily with breakfast.    . rivaroxaban (XARELTO) 20 MG TABS tablet Take 20 mg by mouth daily with supper.    . Sennosides (SENNA LAX PO) Take 2 tablets by mouth at bedtime as needed. For constipation    . valsartan-hydrochlorothiazide (DIOVAN-HCT) 320-25 MG per tablet Take 1 tablet by mouth daily.    Alveda Reasons 20 MG TABS tablet TAKE 1 TABLET BY MOUTH AT BEDTIME 30 tablet 5  . metoprolol succinate (TOPROL XL) 25 MG 24 hr tablet Take 0.5 tablets (12.5 mg total) by mouth daily. May take extra 1/2 tablet for afib heart rate >100 30 tablet 1  . [DISCONTINUED] flecainide (TAMBOCOR) 100 MG tablet Take 100mg  1 tablet prn onset of atrial fib. 30 tablet 6   No current facility-administered medications for this encounter.    No Known Allergies  Social History   Social History  . Marital Status: Married    Spouse Name: [name]  . Number of Children: 3  . Years of Education: college   Occupational History  . retired      Pharmacist, hospital , [place] History Main Topics  . Smoking status: Never Smoker   . Smokeless tobacco: Never Used  . Alcohol Use: 0.6 oz/week    1 Glasses of wine per week     Comment: rare  . Drug Use: No  . Sexual Activity: Not Currently   Other Topics Concern  . Not on file   Social History Narrative   Pt lives in [place] with spouse.  [name])  She was previously a Education officer, museum.    Caffeine- None   Right handed   Patient has three children.   Patient has a college education.    Family History  Problem Relation Age of Onset  . Stroke Mother     Deceased @ 72  . Heart failure Mother     died from  . Liver cancer Father     Deceased @ 60  . Colon cancer Paternal Grandmother     ROS- All systems are reviewed and negative except as per the HPI above  Physical Exam: Filed Vitals:   11/30/15 1419  BP: 140/78  Pulse: 97    Height: 5\' 2"  (1.575 m)  Weight: 121 lb 9.6 oz (55.157 kg)    GEN- The patient is well appearing,  alert and oriented x 3 today.   Head- normocephalic, atraumatic Eyes-  Sclera clear, conjunctiva pink Ears- hearing intact Oropharynx- clear Neck- supple, no JVP Lymph- no cervical lymphadenopathy Lungs- Clear to ausculation bilaterally, normal work of breathing Heart- Irregular rate and rhythm, no murmurs, rubs or gallops, PMI not laterally displaced GI- soft, NT, ND, + BS Extremities- no clubbing, cyanosis, or edema MS- no significant deformity or atrophy Skin- no rash or lesion Psych- euthymic mood, full affect Neuro- strength and sensation are intact  EKG-  Afib  At 97 bpm, st/t wave abnormality, QRS int 86 ms, QTc 528 ms  Assessment and Plan: 1. afib Persistent afib over the last few days Amiodarone increase to bid did not seem to help restore rhythm  Return to amiodarone 200 mg a day Add metoprolol ER 25 mg , 1/2 tab a day to see if can help promote SR/slow v response Continue xarelto  F/u Friday in afib clinic  [place]. Shaunie Boehm, [facility] [address] [place], [place] [address] [phone]

## 2015-12-07 NOTE — Interval H&P Note (Signed)
History and Physical Interval Note:  12/07/2015 1:14 PM  Lorraine Little  has presented today for surgery, with the diagnosis of afib  The various methods of treatment have been discussed with the patient and family. After consideration of risks, benefits and other options for treatment, the patient has consented to  Procedure(s): CARDIOVERSION (N/A) as a surgical intervention .  The patient's history has been reviewed, patient examined, no change in status, stable for surgery.  I have reviewed the patient's chart and labs.  Questions were answered to the patient's satisfaction.     Jenkins Rouge

## 2015-12-07 NOTE — Interval H&P Note (Signed)
History and Physical Interval Note:  12/07/2015 12:24 PM  Lorraine Little  has presented today for surgery, with the diagnosis of afib  The various methods of treatment have been discussed with the patient and family. After consideration of risks, benefits and other options for treatment, the patient has consented to  Procedure(s): CARDIOVERSION (N/A) as a surgical intervention .  The patient's history has been reviewed, patient examined, no change in status, stable for surgery.  I have reviewed the patient's chart and labs.  Questions were answered to the patient's satisfaction.     Jenkins Rouge

## 2015-12-07 NOTE — CV Procedure (Signed)
DCC: On Rx Xarelto Anesthesia:  Dr Jillyn Hidden  60 mg propofol 40 mg lidocaine  120 J biphasic x 1   Converted afib rate 115 to NSR rate 74  No immediate neurologic sequelae  Jenkins Rouge

## 2015-12-07 NOTE — Transfer of Care (Signed)
Immediate Anesthesia Transfer of Care Note  Patient: Lorraine Little  Procedure(s) Performed: Procedure(s): CARDIOVERSION (N/A)  Patient Location: Endoscopy Unit  Anesthesia Type:MAC  Level of Consciousness: awake, alert  and oriented  Airway & Oxygen Therapy: Patient Spontanous Breathing and Patient connected to nasal cannula oxygen  Post-op Assessment: Report given to RN, Post -op Vital signs reviewed and stable and Patient moving all extremities  Post vital signs: Reviewed and stable  Last Vitals:  Filed Vitals:   12/07/15 1235  BP: 145/98  Pulse: 113  Temp: 36.7 C  Resp: 18    Complications: No apparent anesthesia complications

## 2015-12-08 NOTE — Progress Notes (Addendum)
Pt here for EKG prior to cardioversion as she felt she may have converted to SR.  EKG performed and reviewed by Roderic Palau. NP  Pt wanted to make sure she was still in afib before her scheduled cardioversion and EKG confirms continues in afib. She is scheduled at noon today.

## 2015-12-09 ENCOUNTER — Encounter (HOSPITAL_COMMUNITY): Payer: Self-pay | Admitting: Cardiovascular Disease

## 2015-12-10 ENCOUNTER — Ambulatory Visit (INDEPENDENT_AMBULATORY_CARE_PROVIDER_SITE_OTHER): Payer: PPO | Admitting: Internal Medicine

## 2015-12-10 ENCOUNTER — Encounter: Payer: Self-pay | Admitting: Internal Medicine

## 2015-12-10 VITALS — BP 124/62 | HR 111 | Ht 62.5 in | Wt 121.2 lb

## 2015-12-10 DIAGNOSIS — G4733 Obstructive sleep apnea (adult) (pediatric): Secondary | ICD-10-CM

## 2015-12-10 DIAGNOSIS — I480 Paroxysmal atrial fibrillation: Secondary | ICD-10-CM | POA: Diagnosis not present

## 2015-12-10 DIAGNOSIS — I10 Essential (primary) hypertension: Secondary | ICD-10-CM | POA: Diagnosis not present

## 2015-12-10 DIAGNOSIS — Z9989 Dependence on other enabling machines and devices: Secondary | ICD-10-CM

## 2015-12-10 MED ORDER — METOPROLOL SUCCINATE ER 25 MG PO TB24
25.0000 mg | ORAL_TABLET | Freq: Every day | ORAL | Status: DC
Start: 2015-12-10 — End: 2016-04-20

## 2015-12-10 NOTE — Patient Instructions (Signed)
Medication Instructions:  Your physician has recommended you make the following change in your medication:  1) INCREASE Toprol-XL to 25 mg by mouth ONCE daily   Labwork: Your physician recommends that you return for lab work in: Bmet and cbc on 01/12/2016   Testing/Procedures: Your physician has recommended that you have an ablation. Catheter ablation is a medical procedure used to treat some cardiac arrhythmias (irregular heartbeats). During catheter ablation, a long, thin, flexible tube is put into a blood vessel in your groin (upper thigh), or neck. This tube is called an ablation catheter. It is then guided to your heart through the blood vessel. Radio frequency waves destroy small areas of heart tissue where abnormal heartbeats may cause an arrhythmia to start. Please see the instruction sheet given to you today. Scheduled on 01/19/2016  Your physician has requested that you have an echocardiogram. Echocardiography is a painless test that uses sound waves to create images of your heart. It provides your doctor with information about the size and shape of your heart and how well your heart's chambers and valves are working. This procedure takes approximately one hour. There are no restrictions for this procedure.    Follow-Up: Your physician recommends that you schedule a follow-up appointment in: 4 weeks from 01/19/2016 with Lorraine Palau, NP in [facility] and 3 months from 01/19/16 with Dr. Rayann Little.  Any Other Special Instructions Will Be Listed Below (If Applicable).     If you need a refill on your cardiac medications before your next appointment, please call your pharmacy.

## 2015-12-13 NOTE — Progress Notes (Signed)
Electrophysiology Office Note   Date:  12/13/2015   ID:  Lorraine Little, DOB 1941-10-29, MRN [id number]  PCP:  Jerlyn Ly, MD   Primary Electrophysiologist: Dr Caryl Comes  Chief Complaint  Patient presents with  . Atrial Fibrillation     History of Present Illness: Lorraine Little is a 75 y.o. female who presents today for electrophysiology evaluation.  The patient has persistent atrial fibrillation.  She has had ongoing difficulty with afib even despite medical therapy with amiodarone.  She saw me several years ago (with paroxysmal atrial fibrillation) and declined ablation at that time.  She recently underwent cardioversion which failed to keep her in sinus rhythm for very long.  She reports fatigue and decreased exercise tolerance in afib.  She has significant fatigue in afib.  She has occasional palpitations.  Today, she denies symptoms of  chest pain, shortness of breath, orthopnea, PND, lower extremity edema, claudication, dizziness, presyncope, syncope, bleeding, or neurologic sequela. The patient is tolerating medications without difficulties and is otherwise without complaint today.    Past Medical History  Diagnosis Date  . Polymyalgia rheumatica ([place])     a. On chronic steroids  . Hypertension   . GERD (gastroesophageal reflux disease)     pt denies symptoms with this  . Migraine headache   . Cervical disc disease   . Osteoporosis   . Glaucoma   . Osteoarthritis   . Persistent atrial fibrillation ([place])     a.  Diagnosed 07/2011;  b. Normal nuclear myoview 07/2011 and normal EF at that time;  c. Most recent echo 2/20 /13 - EF 55-60% wit mild-mod MR.;  d. s/p DCCV 01/12/2012  . Hyperlipidemia     a. Diagnosed 12/2011  . CVA (cerebral infarction)     a. 11/2011  . Diverticulosis   . Internal hemorrhoid   . Tubular adenoma of colon 04/2007  . Adrenal insufficiency (HCC)     takes prednisone 5mg  daily  . Depression   . Insomnia with sleep apnea    Past Surgical  History  Procedure Laterality Date  . Breast lumpectomy  1980's    right  x2 (benign)  . Tonsillectomy  age 73  . Cardioversion  01/12/2012    Procedure: CARDIOVERSION;  Surgeon: Lelon Perla, MD;  Location: [place];  Service: Cardiovascular;  Laterality: N/A;  . Cardioversion  03/28/2012    Procedure: CARDIOVERSION;  Surgeon: Larey Dresser, MD;  Location: [place];  Service: Cardiovascular;  Laterality: N/A;  . Cardioversion  06/29/2012    Procedure: CARDIOVERSION;  Surgeon: Darlin Coco, MD;  Location: [facility] END[facility]COPY;  Service: Cardiovascular;  Laterality: N/A;  . Glaucoma surgery    . Cardioversion N/A 12/07/2015    Procedure: CARDIOVERSION;  Surgeon: Josue Hector, MD;  Location: [facility] ENDOSCOPY;  Service: Cardiovascular;  Laterality: N/A;     Current Outpatient Prescriptions  Medication Sig Dispense Refill  . alendronate (FOSAMAX) 70 MG tablet Take 70 mg by mouth every 7 (seven) days. Take with a full glass of water on an empty stomach.    Marland Kitchen amiodarone (PACERONE) 200 MG tablet Take 1 tablet (200 mg total) by mouth daily. 60 tablet 6  . amLODipine (NORVASC) 5 MG tablet TAKE 1 TABLET (5 MG TOTAL) BY MOUTH DAILY. 30 tablet 6  . atorvastatin (LIPITOR) 80 MG tablet TAKE 1 TABLET BY MOUTH EVERY DAY 30 tablet 10  . Calcium-Vitamin D 600-200 MG-UNIT per tablet Take 1 tablet by mouth daily.     Marland Kitchen  Casanthranol-Docusate Sodium (STOOL SOFTENER PLUS PO) Take 1 tablet by mouth daily.     . Cholecalciferol (VITAMIN D-3) 5000 UNITS TABS Take 1 tablet by mouth daily. 1 tab daily    . Coenzyme Q10 (CO Q 10 PO) Take 1 tablet by mouth daily.    . ferrous sulfate 325 (65 FE) MG tablet Take 325 mg by mouth daily with breakfast.    . FIBER PO Take 1 tablet by mouth daily.    . fish oil-omega-3 fatty acids 1000 MG capsule Take 1 g by mouth 2 (two) times daily.     Marland Kitchen gabapentin (NEURONTIN) 100 MG capsule TAKE ONE CAPSULE BY MOUTH IN THE MORNING ,1 TABLET MID AFTERNOON & 2 TABLETS AT BEDTIME 120 capsule 6  .  Melatonin 5 MG TABS Take 5 mg by mouth at bedtime.     . metoprolol succinate (TOPROL XL) 25 MG 24 hr tablet Take 1 tablet (25 mg total) by mouth daily. May take 1/2 tablet for afib heart rate >100 30 tablet 1  . omeprazole (PRILOSEC) 20 MG capsule Take 1 capsule (20 mg total) by mouth daily. 90 capsule 3  . polyethylene glycol (MIRALAX / GLYCOLAX) packet Take 17 g by mouth daily.    . potassium chloride 20 MEQ TBCR Take 20 mEq by mouth daily. 35 tablet 1  . predniSONE (DELTASONE) 1 MG tablet Take 4 mg by mouth daily with breakfast.     . rivaroxaban (XARELTO) 20 MG TABS tablet Take 20 mg by mouth daily with supper.    . Sennosides (SENNA LAX PO) Take 2 tablets by mouth at bedtime as needed. For constipation    . valsartan-hydrochlorothiazide (DIOVAN-HCT) 320-25 MG per tablet Take 1 tablet by mouth daily.    . [DISCONTINUED] flecainide (TAMBOCOR) 100 MG tablet Take 100mg  1 tablet prn onset of atrial fib. 30 tablet 6   No current facility-administered medications for this visit.    Allergies:   Review of patient's allergies indicates no known allergies.   Social History:  The patient  reports that she has never smoked. She has never used smokeless tobacco. She reports that she drinks about 0.6 oz of alcohol per week. She reports that she does not use illicit drugs.   Family History:  The patient's  family history includes Colon cancer in her paternal grandmother; Heart failure in her mother; Liver cancer in her father; Stroke in her mother.    ROS:  Please see the history of present illness.   All other systems are reviewed and negative.    PHYSICAL EXAM: VS:  BP 124/62 mmHg  Pulse 111  Ht 5' 2.5" (1.588 m)  Wt 121 lb 3.2 oz (54.976 kg)  BMI 21.80 kg/m2 , BMI Body mass index is 21.8 kg/(m^2). GEN: Well nourished, well developed, in no acute distress HEENT: normal Neck: no JVD, carotid bruits, or masses Cardiac: iRRR; no murmurs, rubs, or gallops,no edema  Respiratory:  clear to  auscultation bilaterally, normal work of breathing GI: soft, nontender, nondistended, + BS MS: no deformity or atrophy Skin: warm and dry  Neuro:  Strength and sensation are intact Psych: euthymic mood, full affect  EKG:  EKG 2//6/17 reveals afib with V rate of 102 bpm, nonspecific ST/T changes   Recent Labs: 08/04/2015: ALT 23; TSH 0.671 12/04/2015: BUN 16; Creatinine, Ser 1.17*; Platelets 245 12/07/2015: Hemoglobin 15.3*; Potassium 4.4; Sodium 137    Lipid Panel     Component Value Date/Time   CHOL 232* 12/21/2011 0550  TRIG 60 12/21/2011 0550   HDL 87 12/21/2011 0550   CHOLHDL 2.7 12/21/2011 0550   VLDL 12 12/21/2011 0550   LDLCALC 133* 12/21/2011 0550     Wt Readings from Last 3 Encounters:  12/10/15 121 lb 3.2 oz (54.976 kg)  11/30/15 121 lb 9.6 oz (55.157 kg)  08/20/15 120 lb (54.432 kg)      Other studies Reviewed: Additional studies/ records that were reviewed today include: afib clinic notes, Dr Aquilla Hacker notes, echo 02/11/14  Review of the above records today demonstrates: EF 60%, mild to moderate MR, pa pressure 46 mm Hg, LA 38 mm   ASSESSMENT AND PLAN:  1.  Persistent atrial fibrillation Refractory to medical therapy with amiodarone.  Quite symptomatic Therapeutic strategies for afib including medicine and ablation were discussed in detail with the patient today. Risk, benefits, and alternatives to EP study and radiofrequency ablation for afib were also discussed in detail today. These risks include but are not limited to stroke, bleeding, vascular damage, tamponade, perforation, damage to the esophagus, lungs, and other structures, pulmonary vein stenosis, worsening renal function, and death. The patient understands these risk and wishes to proceed.  We will therefore proceed with catheter ablation at the next available time.  She will require TEE prior to ablation.  Obtain 2 D echo at this time to evaluate progression of her MR.  chad2vacs score is at least 6.   Continue xarelto.  Increase toprol XL to 25mg  daily for better rate control  2. OSA Compliance with therapy encouraged for better success in afib management.  She will contact neurology and arrange for follow-up visit to help with this. She says that she cannot wear her cpap mask presently.  3. htn Stable No change required today   Current medicines are reviewed at length with the patient today.   The patient does not have concerns regarding her medicines.  The following changes were made today:  none  Labs/ tests ordered today include:  Orders Placed This Encounter  Procedures  . Basic metabolic panel  . CBC with Differential/Platelet  . ECHOCARDIOGRAM COMPLETE     Signed, Thompson Grayer, MD  12/13/2015 3:37 PM     [place] [address] [phone] (office) [phone] (fax)

## 2015-12-15 ENCOUNTER — Ambulatory Visit (HOSPITAL_COMMUNITY): Payer: PPO | Admitting: Nurse Practitioner

## 2015-12-22 ENCOUNTER — Other Ambulatory Visit (INDEPENDENT_AMBULATORY_CARE_PROVIDER_SITE_OTHER): Payer: PPO | Admitting: *Deleted

## 2015-12-22 ENCOUNTER — Ambulatory Visit (HOSPITAL_COMMUNITY): Payer: PPO | Attending: Cardiology

## 2015-12-22 ENCOUNTER — Other Ambulatory Visit: Payer: Self-pay

## 2015-12-22 DIAGNOSIS — I480 Paroxysmal atrial fibrillation: Secondary | ICD-10-CM | POA: Diagnosis not present

## 2015-12-22 DIAGNOSIS — G4733 Obstructive sleep apnea (adult) (pediatric): Secondary | ICD-10-CM | POA: Insufficient documentation

## 2015-12-22 DIAGNOSIS — I071 Rheumatic tricuspid insufficiency: Secondary | ICD-10-CM | POA: Insufficient documentation

## 2015-12-22 DIAGNOSIS — I340 Nonrheumatic mitral (valve) insufficiency: Secondary | ICD-10-CM | POA: Insufficient documentation

## 2015-12-22 DIAGNOSIS — E785 Hyperlipidemia, unspecified: Secondary | ICD-10-CM | POA: Insufficient documentation

## 2015-12-22 DIAGNOSIS — Z8249 Family history of ischemic heart disease and other diseases of the circulatory system: Secondary | ICD-10-CM | POA: Diagnosis not present

## 2015-12-22 DIAGNOSIS — I119 Hypertensive heart disease without heart failure: Secondary | ICD-10-CM | POA: Insufficient documentation

## 2015-12-23 LAB — CBC WITH DIFFERENTIAL/PLATELET
BASOS PCT: 0 % (ref 0–1)
Basophils Absolute: 0 10*3/uL (ref 0.0–0.1)
EOS ABS: 0 10*3/uL (ref 0.0–0.7)
Eosinophils Relative: 0 % (ref 0–5)
HCT: 39.6 % (ref 36.0–46.0)
Hemoglobin: 13.5 g/dL (ref 12.0–15.0)
Lymphocytes Relative: 13 % (ref 12–46)
Lymphs Abs: 0.8 10*3/uL (ref 0.7–4.0)
MCH: 31.6 pg (ref 26.0–34.0)
MCHC: 34.1 g/dL (ref 30.0–36.0)
MCV: 92.7 fL (ref 78.0–100.0)
MONOS PCT: 9 % (ref 3–12)
MPV: 9.2 fL (ref 8.6–12.4)
Monocytes Absolute: 0.6 10*3/uL (ref 0.1–1.0)
NEUTROS PCT: 78 % — AB (ref 43–77)
Neutro Abs: 4.8 10*3/uL (ref 1.7–7.7)
PLATELETS: 247 10*3/uL (ref 150–400)
RBC: 4.27 MIL/uL (ref 3.87–5.11)
RDW: 13.7 % (ref 11.5–15.5)
WBC: 6.2 10*3/uL (ref 4.0–10.5)

## 2015-12-23 LAB — BASIC METABOLIC PANEL
BUN: 19 mg/dL (ref 7–25)
CALCIUM: 9.2 mg/dL (ref 8.6–10.4)
CO2: 28 mmol/L (ref 20–31)
CREATININE: 1.08 mg/dL — AB (ref 0.60–0.93)
Chloride: 98 mmol/L (ref 98–110)
Glucose, Bld: 98 mg/dL (ref 65–99)
Potassium: 4.3 mmol/L (ref 3.5–5.3)
Sodium: 133 mmol/L — ABNORMAL LOW (ref 135–146)

## 2015-12-25 ENCOUNTER — Other Ambulatory Visit: Payer: Self-pay | Admitting: *Deleted

## 2015-12-25 DIAGNOSIS — I480 Paroxysmal atrial fibrillation: Secondary | ICD-10-CM

## 2015-12-26 ENCOUNTER — Other Ambulatory Visit: Payer: Self-pay | Admitting: Neurology

## 2015-12-26 ENCOUNTER — Other Ambulatory Visit: Payer: Self-pay | Admitting: Internal Medicine

## 2015-12-31 ENCOUNTER — Telehealth: Payer: Self-pay | Admitting: Internal Medicine

## 2015-12-31 NOTE — Telephone Encounter (Signed)
Informed patient of results.  Will proceed with ablation

## 2015-12-31 NOTE — Telephone Encounter (Signed)
New message      Pt is calling to get echo results

## 2016-01-01 DIAGNOSIS — G4733 Obstructive sleep apnea (adult) (pediatric): Secondary | ICD-10-CM | POA: Diagnosis not present

## 2016-01-05 ENCOUNTER — Ambulatory Visit: Payer: PPO | Admitting: Neurology

## 2016-01-07 DIAGNOSIS — R0989 Other specified symptoms and signs involving the circulatory and respiratory systems: Secondary | ICD-10-CM | POA: Diagnosis not present

## 2016-01-07 DIAGNOSIS — R05 Cough: Secondary | ICD-10-CM | POA: Diagnosis not present

## 2016-01-07 DIAGNOSIS — Z6821 Body mass index (BMI) 21.0-21.9, adult: Secondary | ICD-10-CM | POA: Diagnosis not present

## 2016-01-07 DIAGNOSIS — R0602 Shortness of breath: Secondary | ICD-10-CM | POA: Diagnosis not present

## 2016-01-07 DIAGNOSIS — I480 Paroxysmal atrial fibrillation: Secondary | ICD-10-CM | POA: Diagnosis not present

## 2016-01-11 ENCOUNTER — Telehealth: Payer: Self-pay | Admitting: Internal Medicine

## 2016-01-11 NOTE — Telephone Encounter (Signed)
She was seen by her PCP last Thurs and she has URI.  She is being treated with an antibiotic and Dr Rayann Heman is aware.  Will proceed as she has been afebrile

## 2016-01-11 NOTE — Telephone Encounter (Signed)
Please call,concerning her ablation that is coming up.

## 2016-01-12 ENCOUNTER — Other Ambulatory Visit (INDEPENDENT_AMBULATORY_CARE_PROVIDER_SITE_OTHER): Payer: PPO | Admitting: *Deleted

## 2016-01-12 DIAGNOSIS — I480 Paroxysmal atrial fibrillation: Secondary | ICD-10-CM

## 2016-01-12 LAB — CBC WITH DIFFERENTIAL/PLATELET
Basophils Absolute: 0 10*3/uL (ref 0.0–0.1)
Basophils Relative: 0 % (ref 0–1)
EOS PCT: 0 % (ref 0–5)
Eosinophils Absolute: 0 10*3/uL (ref 0.0–0.7)
HEMATOCRIT: 38.7 % (ref 36.0–46.0)
Hemoglobin: 13.6 g/dL (ref 12.0–15.0)
LYMPHS PCT: 11 % — AB (ref 12–46)
Lymphs Abs: 0.9 10*3/uL (ref 0.7–4.0)
MCH: 32.1 pg (ref 26.0–34.0)
MCHC: 35.1 g/dL (ref 30.0–36.0)
MCV: 91.3 fL (ref 78.0–100.0)
MONO ABS: 0.9 10*3/uL (ref 0.1–1.0)
MPV: 8.6 fL (ref 8.6–12.4)
Monocytes Relative: 11 % (ref 3–12)
Neutro Abs: 6.6 10*3/uL (ref 1.7–7.7)
Neutrophils Relative %: 78 % — ABNORMAL HIGH (ref 43–77)
Platelets: 326 10*3/uL (ref 150–400)
RBC: 4.24 MIL/uL (ref 3.87–5.11)
RDW: 13.7 % (ref 11.5–15.5)
WBC: 8.5 10*3/uL (ref 4.0–10.5)

## 2016-01-12 LAB — BASIC METABOLIC PANEL
BUN: 22 mg/dL (ref 7–25)
CO2: 30 mmol/L (ref 20–31)
CREATININE: 1.15 mg/dL — AB (ref 0.60–0.93)
Calcium: 9.3 mg/dL (ref 8.6–10.4)
Chloride: 97 mmol/L — ABNORMAL LOW (ref 98–110)
GLUCOSE: 97 mg/dL (ref 65–99)
POTASSIUM: 3.4 mmol/L — AB (ref 3.5–5.3)
Sodium: 135 mmol/L (ref 135–146)

## 2016-01-14 ENCOUNTER — Telehealth: Payer: Self-pay

## 2016-01-14 ENCOUNTER — Encounter: Payer: Self-pay | Admitting: Neurology

## 2016-01-14 ENCOUNTER — Ambulatory Visit (INDEPENDENT_AMBULATORY_CARE_PROVIDER_SITE_OTHER): Payer: PPO | Admitting: Neurology

## 2016-01-14 VITALS — BP 86/58 | HR 86 | Resp 20 | Ht 62.0 in | Wt 120.0 lb

## 2016-01-14 DIAGNOSIS — G4700 Insomnia, unspecified: Secondary | ICD-10-CM | POA: Diagnosis not present

## 2016-01-14 DIAGNOSIS — G4730 Sleep apnea, unspecified: Secondary | ICD-10-CM | POA: Diagnosis not present

## 2016-01-14 DIAGNOSIS — G4733 Obstructive sleep apnea (adult) (pediatric): Secondary | ICD-10-CM

## 2016-01-14 DIAGNOSIS — Z9989 Dependence on other enabling machines and devices: Secondary | ICD-10-CM

## 2016-01-14 DIAGNOSIS — I480 Paroxysmal atrial fibrillation: Secondary | ICD-10-CM

## 2016-01-14 MED ORDER — ZALEPLON 5 MG PO CAPS: 5.0000 mg | ORAL_CAPSULE | Freq: Every evening | ORAL | Status: DC | PRN

## 2016-01-14 NOTE — Progress Notes (Signed)
[facility]  Provider:  Larey Seat, M D  Referring Provider: Crist Infante, MD Primary Care Physician:  Jerlyn Ly, MD  Chief Complaint  Patient presents with  . Follow-up    uses cpap more, going better, BP 86/58, some dizziness, rm 11, alone    HPI:  VERNETT KUCHAREK is a 75 y.o., caucasian, right handed female.   She  is seen here as a revisit  from Dr. Joylene Draft for evaluation of sleep apnea with insomnia on CPAP . Dr. Clydene Fake has seen this patient on  [facility] service.    Mrs. Haugh an established patient in the [facility] and is presenting here today with a complaint of intermittent insomnia. She can not even sleep on vacation while [place]. This has been a problem for several years. Her primary care physician [name] has at times prescribed sleep aids,  but felt that it was reasonable to evaluate her sleep further before continuing to prescribe sedatives. The sleep test was positive and the patient was started on CPAP- after initially struggling, she is now compliant and used CPAP with great regularity. The mask took some getting used to. She remains hoarse - see Assessment and Plan.  I was able to obtain a new download10-8-15 from [place] - This showed an AHI of 0.4 and a compliance of 97% over the last 30 days -with an average of 6 hours 43 minutes nightly he was.  The patient is now using a small  "nasal mask [place] "and the humidifier was inspected ; there was normal technological function confirmed -her CPAP machine works well .  Patient Battaglia underwent a sleep study on 11-27-13. AHI was 33.1 and her RDI was 33.1/hr. as well. Not a mild case of apnea.The lowest oxygen level was at 78% , 02 saturation was 12.6 minutes total time below 89% saturation. She had irregular heart rate and normal sinus rhythm throughout the diagnostic study. There were no periodic limb movements noted. Based on the high AHI CPAP was initiated  and the patient was only titrated to 5 cm with. She still had a very poor sleep efficiency but AHI was reduced to 0. She used a nasal pillow and an ESON  nasal mask.  Download was reviewed today. She brought me today at a download from her machine and it shows that she has used the machine 5 hours and 5 minutes nightly , a 97% compliance over 30 days.  The residual AHI of 0.4 is very good,  having reached numerically excellent results she states that she is laying awake for hours before finally going to sleep !-  and that she actually feels no help in sleeping deeper or longer by the CPAP machine.The 95th percentile pressure was actually 9.8cm on AUTO-set titration at Home.  The minimum pressure was set at 5 the maximum pressure at 10 cm water and for 95% of the time the patient seems to use it at 9.8 cm water.  Again,  her main problem now is not the residual apnea that her insomnia. She had trouble sleeping for a long time before OSA was diagnosed and CPAP initiated. The CPAP has helped her headaches! Her problem is sleep initiation, not staying asleep. She has less palpiations. Continues on amiodarone , which can induce insomnia . She is tolerating it well, but had some neuropathic changes.  Belsomra at 10 mg was felt too strong, to groggy making in AM.  I offered her 5 mg today.  I would like for her to consider the ablation therapy form atrial fibrillation, and to be able to d/c the amiodarone treatment.  She will discuss this step with Dr. Virl Axe , her cardiologist and electrophysiologist this afternoon. 08-14-14 .  Revisit on 03-20-15 Mrs. Johnetta Egerer is here today and stated that she had a skin rash that she experienced back in February that matched the facial impact area of her nasal CPAP. She discontinued the use of the CPAP at that time and had no chance to see me earlier than today to address this issue. Her compliance is accordingly poor since the machine wasn't used. Her CPAP has  worked wonders for the apnea before however she is now currently untreated. She has not responded well to the [place] for insomnia treatment and stated that she felt  "hung over "the next day.  She purchased melatonin which seems to help her better.  [facility] for [facility] her with different nasal masks. I'm not sure if she will experience another skin rash again if she returns to the CPAP impact I discussed with her the alternative of a dream wear mask, she will give it another try.   Revisit from 08-20-15, Mrs. Goblirsch is here today and reports that she has not used her CPAP for over 5 months. Her headaches however have not returned. She does not feel it had any impact on her sleep or lack of sleep. She has been treated with gabapentin and she thinks this has successfully controlled her headaches. Her fatigue severity score today is 31 points and her Epworth sleepiness score is 4 points the geriatric depression score is endorsed at one point. Overall she feels well. She never found an comfortable interface without any breakout or rash, and the nasal pillow dislodged frequently. This actually interrupted her sleep and she felt that this she got less sleep on CPAP than without. She effectively discontinued CPAP use. Her cardiologist suggested to use CPAP for better HTN control. She will have to make a decision.   Rv 01-14-16  I agree with dr Rayann Heman that atrial fibrillations are precipitated by risk factors such as caffeine or stimulant use,  OSA. Her apnea is moderate -severe. She struggles with Insomnia and felt CPAP was contributing or at least not helping. She reported air leaks form nasal interface , irritating her eyes. I will reinitiate CPAP at current pressure.  She endorsed the Epworth sleepiness score at 3 points fatigue severity at 32 points, geriatric depression at one point. Today's download shows an 83% compliance was 24 out of 30 days of over 4 hours of daily use. The  patient used to machine 5 hours and 31 minutes a day , on average this is an Auto machine between 5 and 10 cm water pressure was 1 cm EPR, residual AHI is 0.6 and excellent result the 95th percentile pressure is 10 cm water. I will ask her to have an appointment for a daytime PAP Nap. This should reveal the best fitting interface - perhaps a wisp?      Review of Systems: Out of a complete 14 system review, the patient complains of only the following symptoms, and all other reviewed systems are negative. Snoring, dry mouth, palpitations, myalgia, INSOMNIA . Insomnia better controlled on melatonin.   She endorsed today the Epworth Sleepiness Scale at 4 points, the fatigue severity score of 31 points and the geriatric depression score at 1 point.  Social History   Social History  . Marital Status: Married    Spouse Name: [name]  . Number of Children: 3  . Years of Education: college   Occupational History  . retired      Pharmacist, hospital , [place] History Main Topics  . Smoking status: Never Smoker   . Smokeless tobacco: Never Used  . Alcohol Use: 0.6 oz/week    1 Glasses of wine per week     Comment: rare  . Drug Use: No  . Sexual Activity: Not Currently   Other Topics Concern  . Not on file   Social History Narrative   Pt lives in [place] with spouse.  [name])  She was previously a Education officer, museum.    Caffeine- None   Right handed   Patient has three children.   Patient has a college education.    Family History  Problem Relation Age of Onset  . Stroke Mother     Deceased @ 3  . Heart failure Mother     died from  . Liver cancer Father     Deceased @ 70  . Colon cancer Paternal Grandmother     Past Medical History  Diagnosis Date  . Polymyalgia rheumatica ([place])     a. On chronic steroids  . Hypertension   . GERD (gastroesophageal reflux disease)     pt denies symptoms with this  . Migraine headache   . Cervical disc disease   . Osteoporosis   . Glaucoma    . Osteoarthritis   . Persistent atrial fibrillation ([place])     a.  Diagnosed 07/2011;  b. Normal nuclear myoview 07/2011 and normal EF at that time;  c. Most recent echo 2/20 /13 - EF 55-60% wit mild-mod MR.;  d. s/p DCCV 01/12/2012  . Hyperlipidemia     a. Diagnosed 12/2011  . CVA (cerebral infarction)     a. 11/2011  . Diverticulosis   . Internal hemorrhoid   . Tubular adenoma of colon 04/2007  . Adrenal insufficiency (HCC)     takes prednisone 5mg  daily  . Depression   . Insomnia with sleep apnea     Past Surgical History  Procedure Laterality Date  . Breast lumpectomy  1980's    right  x2 (benign)  . Tonsillectomy  age 83  . Cardioversion  01/12/2012    Procedure: CARDIOVERSION;  Surgeon: Lelon Perla, MD;  Location: [facility];  Service: Cardiovascular;  Laterality: N/A;  . Cardioversion  03/28/2012    Procedure: CARDIOVERSION;  Surgeon: Larey Dresser, MD;  Location: [facility];  Service: Cardiovascular;  Laterality: N/A;  . Cardioversion  06/29/2012    Procedure: CARDIOVERSION;  Surgeon: Darlin Coco, MD;  Location: [facility] ENDOSCOPY;  Service: Cardiovascular;  Laterality: N/A;  . Glaucoma surgery    . Cardioversion N/A 12/07/2015    Procedure: CARDIOVERSION;  Surgeon: Josue Hector, MD;  Location: [facility] ENDOSCOPY;  Service: Cardiovascular;  Laterality: N/A;    Current Outpatient Prescriptions  Medication Sig Dispense Refill  . alendronate (FOSAMAX) 70 MG tablet Take 70 mg by mouth every 7 (seven) days. Take with a full glass of water on an empty stomach.    Marland Kitchen amiodarone (PACERONE) 200 MG tablet Take 1 tablet (200 mg total) by mouth daily. 60 tablet 6  . amiodarone (PACERONE) 200 MG tablet Take 1 tablet (200 mg total) by mouth daily. 30 tablet 3  . amLODipine (NORVASC) 5 MG tablet TAKE 1 TABLET (5 MG TOTAL)  BY MOUTH DAILY. 30 tablet 6  . atorvastatin (LIPITOR) 80 MG tablet TAKE 1 TABLET BY MOUTH EVERY DAY 30 tablet 10  . Calcium-Vitamin D 600-200 MG-UNIT per tablet Take 1 tablet by mouth  daily.     [name] Sodium (STOOL SOFTENER PLUS PO) Take 1 tablet by mouth daily.     . Cholecalciferol (VITAMIN D-3) 5000 UNITS TABS Take 1 tablet by mouth daily. 1 tab daily    . Coenzyme Q10 (CO Q 10 PO) Take 1 tablet by mouth daily.    . ferrous sulfate 325 (65 FE) MG tablet Take 325 mg by mouth daily with breakfast.    . FIBER PO Take 1 tablet by mouth daily.    . fish oil-omega-3 fatty acids 1000 MG capsule Take 1 g by mouth 2 (two) times daily.     Marland Kitchen gabapentin (NEURONTIN) 100 MG capsule TAKE ONE CAPSULE BY MOUTH IN THE MORNING ,1 TABLET MID AFTERNOON & 2 TABLETS AT BEDTIME 120 capsule 6  . Melatonin 3 MG TABS Take 6 mg by mouth.    . metoprolol succinate (TOPROL XL) 25 MG 24 hr tablet Take 1 tablet (25 mg total) by mouth daily. May take 1/2 tablet for afib heart rate >100 30 tablet 1  . omeprazole (PRILOSEC) 20 MG capsule Take 1 capsule (20 mg total) by mouth daily. 90 capsule 3  . polyethylene glycol (MIRALAX / GLYCOLAX) packet Take 17 g by mouth daily.    . potassium chloride 20 MEQ TBCR Take 20 mEq by mouth daily. 35 tablet 1  . predniSONE (DELTASONE) 1 MG tablet Take 3 mg by mouth daily with breakfast.     . rivaroxaban (XARELTO) 20 MG TABS tablet Take 20 mg by mouth daily with supper.    . Sennosides (SENNA LAX PO) Take 2 tablets by mouth at bedtime as needed. For constipation    . valsartan-hydrochlorothiazide (DIOVAN-HCT) 320-25 MG per tablet Take 1 tablet by mouth daily.    . [DISCONTINUED] flecainide (TAMBOCOR) 100 MG tablet Take 100mg  1 tablet prn onset of atrial fib. 30 tablet 6   No current facility-administered medications for this visit.    Allergies as of 01/14/2016  . (No Known Allergies)    Vitals: BP 86/58 mmHg  Pulse 86  Resp 20  Ht 5\' 2"  (1.575 m)  Wt 120 lb (54.432 kg)  BMI 21.94 kg/m2 Last Weight:  Wt Readings from Last 1 Encounters:  01/14/16 120 lb (54.432 kg)   Last Height:   Ht Readings from Last 1 Encounters:  01/14/16 5\' 2"   (1.575 m)    Physical exam:  General: The patient is awake, alert and appears not in acute distress. The patient is very well groomed. Head: Normocephalic, atraumatic. Neck is supple. Mallampati 2, neck circumference: 14 inches . No retrognathia.  Cardiovascular:  Regular rate and rhythm , without  murmurs or carotid bruit, and without distended neck veins. Respiratory: Lungs are clear to auscultation. Skin:  Without evidence of edema, there is a nasal and peri-nasal Rash. Trunk: BMI is normal . Neurologic exam : The patient is awake and alert, oriented to place and time.  Memory subjective  described as intact.  There is a normal attention span & concentration ability. The patient is articulate and cooperative.  Speech is fluent without dysarthria, she has dysphonia - chronic hoarseness .  Mood and affect are concerned/ worried. Cranial nerves: Pupils are equal and briskly reactive to light.  Facial motor strength is symmetric and tongue  and uvula move midline. Motor exam:   Normal tone and normal muscle bulk and symmetric normal strength in all extremities. Coordination: Rapid alternating movements in the fingers/hands is  normal.Gait and station: Patient walks without assistive device. Deep tendon reflexes: in the upper and lower extremities are symmetric and intact.    Assessment:  After physical and neurologic examination, review of laboratory studies, imaging, neurophysiology testing and pre-existing records,  15 minute assessment with more than 50% of the face to face time dedicated to discussion of    1) OSA risk factors,  Her non compliance in CPAP treatment  Is related to an  increased  risk of conditions such as CVA, atrial Fibrillation, HTN .  2) insomnia- Unfortunately the CPAP use never helped her to achieve longer or deeper sleep. She relies on a mixture of prescription or over-the-counter medications. She had some relief with melatonin which is not habit-forming. 3)  confusion, desorientation, forgetfulness:"  None recently "    PLAN: Insomnia, sleep apnea, memory loss She could not tolerate Belsomra, but melatonin 3 mg . I will give a prescription for SONATA,  Mrs. Fugate will discuss with her cardiologist, Dr. Virl Axe, and Dr Rayann Heman- and yes,  he feels the need for her to reinitiate CPAP use. She has atrial fibrillation, had planned an ablation and  Was asked to improve her CPAP compliance -   Strictly from a sleep quality standpoint improvement was marginal. She struggled with CPAP, and felt her sleep was worse.  She is invited to follow up when necessary or every 12 month.   She wants to address Memory deficits, should these progress , with her PCP,  Dr Joylene Draft. She was not thrilled to undergo any cognitive testing.    Cc Dr Joylene Draft. Dr Virl Axe and  Dr. Thompson Grayer, MD.

## 2016-01-14 NOTE — Telephone Encounter (Signed)
PA completed, sent to [place] for determination, should hear back in 3-5 business days.

## 2016-01-14 NOTE — Patient Instructions (Signed)
CPAP and BIPAP Information CPAP and BIPAP are methods of helping you breathe with the use of air pressure. CPAP stands for "continuous positive airway pressure." BIPAP stands for "bi-level positive airway pressure." In both methods, air is blown into your air passages to help keep you breathing well. With CPAP, the amount of pressure stays the same while you breathe in and out. CPAP is most commonly used for obstructive sleep apnea. For obstructive sleep apnea, CPAP works by holding your airways open so that they do not collapse when your muscles relax during sleep. BIPAP is similar to CPAP except the amount of pressure is increased when you inhale. This helps you take larger breaths. Your health care provider will recommend whether CPAP or BIPAP would be more helpful for you.  WHY ARE CPAP AND BIPAP TREATMENTS USED? CPAP or BIPAP can be helpful if you have:   Sleep apnea.   Chronic obstructive pulmonary disease (COPD).   Diseases that weaken the muscles of the chest, including muscular dystrophy or neurological diseases such as amyotrophic lateral sclerosis (ALS).  Other problems that cause breathing to be weak, abnormal, or difficult.  HOW IS CPAP OR BIPAP ADMINISTERED? Both CPAP and BIPAP are provided by a small machine with a flexible plastic tube that attaches to a plastic mask. The mask fits on your face, and air is blown into your air passages through your nose or mouth. The amount of pressure that is used to blow the air into your air passages can be set on the machine. Your health care provider will determine the pressure setting that should be used based on your individual needs. WHEN SHOULD CPAP OR BIPAP BE USED? In most cases, the mask is worn only when sleeping. Generally, you will need to wear the mask throughout the night and during the daytime if you take a nap. In a few cases involving certain medical conditions, people also need to wear the mask at other times when they are awake.  Follow your health care provider's instructions for when to use the machine.  USING THE MASK  Because the mask needs to be snug, some people feel a trapped or closed-in feeling (claustrophobic) when first using the mask. You may need to get used to the mask gradually. To do this, you can first hold the mask loosely over your nose or mouth. Gradually apply the mask more snugly. You can also gradually increase the amount of time that you use the mask.  Masks are available in various types and sizes. Some fit over your mouth and nose, and some fit over just your nose. If your mask does not fit well, talk to your health care provider about getting a different one.  If you are using a nasal mask and you tend to breathe through your mouth, a chin strap may be applied to help keep your mouth closed.   The CPAP and BIPAP machines have alarms that may sound if the mask comes off or develops a leak.  If you have trouble with the mask, it is very important that you talk to your health care provider about finding a way to make the mask easier to tolerate. Do not stop using the mask. This could have a negative impact on your health. TIPS FOR USING THE MACHINE  Place your CPAP or BIPAP machine on a secure table or stand near an electrical outlet.   Know where the on-off switch is located on the machine.  Follow your health care provider's   instructions for how to set the pressure on your machine and when you should use it.  Do not eat or drink while the CPAP or BIPAP machine is on. Food or fluids could get pushed into your lungs by the pressure of the CPAP or BIPAP.  Do not smoke. Tobacco smoke residue can damage the machine.   For home use, CPAP and BIPAP machines can be rented or purchased through home health care companies. Many different brands of machines are available. Renting a machine before purchasing may help you find out which particular machine works well for you. SEEK IMMEDIATE MEDICAL CARE  IF:  You have redness or open areas around your nose or mouth where the mask fits.   You have trouble operating the CPAP or BIPAP machine.   You cannot tolerate wearing the CPAP or BIPAP mask.    This information is not intended to replace advice given to you by your health care provider. Make sure you discuss any questions you have with your health care provider.   Document Released: 07/15/2004 Document Revised: 11/07/2014 Document Reviewed: 05/16/2013 Elsevier Interactive Patient Education 2016 [place]. Atrial Fibrillation Atrial fibrillation is a type of irregular or rapid heartbeat (arrhythmia). In atrial fibrillation, the heart quivers continuously in a chaotic pattern. This occurs when parts of the heart receive disorganized signals that make the heart unable to pump blood normally. This can increase the risk for stroke, heart failure, and other heart-related conditions. There are different types of atrial fibrillation, including:  Paroxysmal atrial fibrillation. This type starts suddenly, and it usually stops on its own shortly after it starts.  Persistent atrial fibrillation. This type often lasts longer than a week. It may stop on its own or with treatment.  Long-lasting persistent atrial fibrillation. This type lasts longer than 12 months.  Permanent atrial fibrillation. This type does not go away. Talk with your health care provider to learn about the type of atrial fibrillation that you have. CAUSES This condition is caused by some heart-related conditions or procedures, including:  A heart attack.  Coronary artery disease.  Heart failure.  Heart valve conditions.  High blood pressure.  Inflammation of the sac that surrounds the heart (pericarditis).  Heart surgery.  Certain heart rhythm disorders, such as Wolf-Parkinson-White syndrome. Other causes include:  Pneumonia.  Obstructive sleep apnea.  Blockage of an artery in the lungs (pulmonary  embolism, or PE).  Lung cancer.  Chronic lung disease.  Thyroid problems, especially if the thyroid is overactive (hyperthyroidism).  Caffeine.  Excessive alcohol use or illegal drug use.  Use of some medicines, including certain decongestants and diet pills. Sometimes, the cause cannot be found. RISK FACTORS This condition is more likely to develop in:  People who are older in age.  People who smoke.  People who have diabetes mellitus.  People who are overweight (obese).  Athletes who exercise vigorously. SYMPTOMS Symptoms of this condition include:  A feeling that your heart is beating rapidly or irregularly.  A feeling of discomfort or pain in your chest.  Shortness of breath.  Sudden light-headedness or weakness.  Getting tired easily during exercise. In some cases, there are no symptoms. DIAGNOSIS Your health care provider may be able to detect atrial fibrillation when taking your pulse. If detected, this condition may be diagnosed with:  An electrocardiogram (ECG).  A Holter monitor test that records your heartbeat patterns over a 24-hour period.  Transthoracic echocardiogram (TTE) to evaluate how blood flows through your heart.  Transesophageal  echocardiogram (TEE) to view more detailed images of your heart.  A stress test.  Imaging tests, such as a CT scan or chest X-ray.  Blood tests. TREATMENT The main goals of treatment are to prevent blood clots from forming and to keep your heart beating at a normal rate and rhythm. The type of treatment that you receive depends on many factors, such as your underlying medical conditions and how you feel when you are experiencing atrial fibrillation. This condition may be treated with:  Medicine to slow down the heart rate, bring the heart's rhythm back to normal, or prevent clots from forming.  Electrical cardioversion. This is a procedure that resets your heart's rhythm by delivering a controlled, low-energy  shock to the heart through your skin.  Different types of ablation, such as catheter ablation, catheter ablation with pacemaker, or surgical ablation. These procedures destroy the heart tissues that send abnormal signals. When the pacemaker is used, it is placed under your skin to help your heart beat in a regular rhythm. HOME CARE INSTRUCTIONS  Take over-the counter and prescription medicines only as told by your health care provider.  If your health care provider prescribed a blood-thinning medicine (anticoagulant), take it exactly as told. Taking too much blood-thinning medicine can cause bleeding. If you do not take enough blood-thinning medicine, you will not have the protection that you need against stroke and other problems.  Do not use tobacco products, including cigarettes, chewing tobacco, and e-cigarettes. If you need help quitting, ask your health care provider.  If you have obstructive sleep apnea, manage your condition as told by your health care provider.  Do not drink alcohol.  Do not drink beverages that contain caffeine, such as coffee, soda, and tea.  Maintain a healthy weight. Do not use diet pills unless your health care provider approves. Diet pills may make heart problems worse.  Follow diet instructions as told by your health care provider.  Exercise regularly as told by your health care provider.  Keep all follow-up visits as told by your health care provider. This is important. PREVENTION  Avoid drinking beverages that contain caffeine or alcohol.  Avoid certain medicines, especially medicines that are used for breathing problems.  Avoid certain herbs and herbal medicines, such as those that contain ephedra or ginseng.  Do not use illegal drugs, such as cocaine and amphetamines.  Do not smoke.  Manage your high blood pressure. SEEK MEDICAL CARE IF:  You notice a change in the rate, rhythm, or strength of your heartbeat.  You are taking an  anticoagulant and you notice increased bruising.  You tire more easily when you exercise or exert yourself. SEEK IMMEDIATE MEDICAL CARE IF:  You have chest pain, abdominal pain, sweating, or weakness.  You feel nauseous.  You notice blood in your vomit, bowel movement, or urine.  You have shortness of breath.  You suddenly have swollen feet and ankles.  You feel dizzy.  You have sudden weakness or numbness of the face, arm, or leg, especially on one side of the body.  You have trouble speaking, trouble understanding, or both (aphasia).  Your face or your eyelid droops on one side. These symptoms may represent a serious problem that is an emergency. Do not wait to see if the symptoms will go away. Get medical help right away. Call your local emergency services (911 in the [place] Do not drive yourself to the hospital.   This information is not intended to replace advice given to  you by your health care provider. Make sure you discuss any questions you have with your health care provider.   Document Released: 10/17/2005 Document Revised: 07/08/2015 Document Reviewed: 02/11/2015 Elsevier Interactive Patient Education [employer].

## 2016-01-19 ENCOUNTER — Ambulatory Visit (HOSPITAL_BASED_OUTPATIENT_CLINIC_OR_DEPARTMENT_OTHER): Payer: PPO

## 2016-01-19 ENCOUNTER — Encounter: Payer: Self-pay | Admitting: *Deleted

## 2016-01-19 ENCOUNTER — Encounter (HOSPITAL_COMMUNITY): Payer: Self-pay | Admitting: *Deleted

## 2016-01-19 ENCOUNTER — Encounter (HOSPITAL_COMMUNITY)
Admission: RE | Disposition: A | Discharge status: Home / Self Care | Payer: Self-pay | Source: Ambulatory Visit | Attending: Cardiovascular Disease

## 2016-01-19 ENCOUNTER — Ambulatory Visit (HOSPITAL_COMMUNITY): Payer: PPO | Admitting: Anesthesiology

## 2016-01-19 ENCOUNTER — Ambulatory Visit (HOSPITAL_COMMUNITY)
Admission: RE | Admit: 2016-01-19 | Discharge: 2016-01-20 | Disposition: A | Discharge status: Home / Self Care | Payer: PPO | Source: Ambulatory Visit | Attending: Cardiovascular Disease | Admitting: Cardiovascular Disease

## 2016-01-19 DIAGNOSIS — I4891 Unspecified atrial fibrillation: Secondary | ICD-10-CM | POA: Diagnosis present

## 2016-01-19 DIAGNOSIS — Z7952 Long term (current) use of systemic steroids: Secondary | ICD-10-CM | POA: Diagnosis not present

## 2016-01-19 DIAGNOSIS — M1990 Unspecified osteoarthritis, unspecified site: Secondary | ICD-10-CM | POA: Insufficient documentation

## 2016-01-19 DIAGNOSIS — Z8673 Personal history of transient ischemic attack (TIA), and cerebral infarction without residual deficits: Secondary | ICD-10-CM | POA: Diagnosis not present

## 2016-01-19 DIAGNOSIS — I340 Nonrheumatic mitral (valve) insufficiency: Secondary | ICD-10-CM

## 2016-01-19 DIAGNOSIS — I10 Essential (primary) hypertension: Secondary | ICD-10-CM | POA: Diagnosis not present

## 2016-01-19 DIAGNOSIS — K219 Gastro-esophageal reflux disease without esophagitis: Secondary | ICD-10-CM | POA: Diagnosis not present

## 2016-01-19 DIAGNOSIS — I481 Persistent atrial fibrillation: Secondary | ICD-10-CM | POA: Diagnosis not present

## 2016-01-19 DIAGNOSIS — E785 Hyperlipidemia, unspecified: Secondary | ICD-10-CM | POA: Insufficient documentation

## 2016-01-19 DIAGNOSIS — M353 Polymyalgia rheumatica: Secondary | ICD-10-CM | POA: Diagnosis not present

## 2016-01-19 HISTORY — PX: TEE WITHOUT CARDIOVERSION: SHX5443

## 2016-01-19 HISTORY — PX: ELECTROPHYSIOLOGIC STUDY: SHX172A

## 2016-01-19 LAB — POCT ACTIVATED CLOTTING TIME
ACTIVATED CLOTTING TIME: 301 s
ACTIVATED CLOTTING TIME: 157 s
Activated Clotting Time: 286 seconds
Activated Clotting Time: 307 seconds

## 2016-01-19 LAB — POTASSIUM: POTASSIUM: 3.7 mmol/L (ref 3.5–5.1)

## 2016-01-19 LAB — MRSA PCR SCREENING: MRSA BY PCR: NEGATIVE

## 2016-01-19 SURGERY — ECHOCARDIOGRAM, TRANSESOPHAGEAL
Anesthesia: Moderate Sedation

## 2016-01-19 SURGERY — ATRIAL FIBRILLATION ABLATION
Anesthesia: General

## 2016-01-19 MED ORDER — PHENYLEPHRINE HCL 10 MG/ML IJ SOLN
INTRAMUSCULAR | Status: DC | PRN
  Administered 2016-01-19: 120 ug via INTRAVENOUS
  Administered 2016-01-19: 160 ug via INTRAVENOUS
  Administered 2016-01-19: 120 ug via INTRAVENOUS

## 2016-01-19 MED ORDER — HEPARIN SODIUM (PORCINE) 1000 UNIT/ML IJ SOLN
INTRAMUSCULAR | Status: DC | PRN
Start: 2016-01-19 — End: 2016-01-19
  Administered 2016-01-19: 1000 [IU] via INTRAVENOUS

## 2016-01-19 MED ORDER — MIDAZOLAM HCL 10 MG/2ML IJ SOLN
INTRAMUSCULAR | Status: DC | PRN
  Administered 2016-01-19: 1 mg via INTRAVENOUS
  Administered 2016-01-19: 2 mg via INTRAVENOUS

## 2016-01-19 MED ORDER — FENTANYL CITRATE (PF) 100 MCG/2ML IJ SOLN
INTRAMUSCULAR | Status: DC | PRN
  Administered 2016-01-19: 100 ug via INTRAVENOUS

## 2016-01-19 MED ORDER — MIDAZOLAM HCL 5 MG/ML IJ SOLN
INTRAMUSCULAR | Status: AC
  Filled 2016-01-19: qty 2

## 2016-01-19 MED ORDER — FENTANYL CITRATE (PF) 100 MCG/2ML IJ SOLN
INTRAMUSCULAR | Status: DC | PRN
  Administered 2016-01-19: 50 ug via INTRAVENOUS

## 2016-01-19 MED ORDER — SODIUM CHLORIDE 0.9 % IV SOLN: 250.0000 mL | INTRAVENOUS | Status: DC | PRN

## 2016-01-19 MED ORDER — PHENYLEPHRINE HCL 10 MG/ML IJ SOLN
10.0000 mg | INTRAMUSCULAR | Status: DC | PRN
  Administered 2016-01-19: 30 ug/min via INTRAVENOUS

## 2016-01-19 MED ORDER — LIDOCAINE HCL (CARDIAC) 20 MG/ML IV SOLN
INTRAVENOUS | Status: DC | PRN
  Administered 2016-01-19: 40 mg via INTRAVENOUS

## 2016-01-19 MED ORDER — ONDANSETRON HCL 4 MG/2ML IJ SOLN
INTRAMUSCULAR | Status: DC | PRN
  Administered 2016-01-19: 4 mg via INTRAVENOUS

## 2016-01-19 MED ORDER — SODIUM CHLORIDE 0.9% FLUSH: 3.0000 mL | INTRAVENOUS | Status: DC | PRN

## 2016-01-19 MED ORDER — PROTAMINE SULFATE 10 MG/ML IV SOLN
INTRAVENOUS | Status: DC | PRN
  Administered 2016-01-19: 30 mg via INTRAVENOUS

## 2016-01-19 MED ORDER — BUPIVACAINE HCL (PF) 0.25 % IJ SOLN
INTRAMUSCULAR | Status: AC
  Filled 2016-01-19: qty 30

## 2016-01-19 MED ORDER — OXYCODONE HCL 5 MG PO TABS: 5.0000 mg | ORAL_TABLET | Freq: Once | ORAL | Status: DC | PRN

## 2016-01-19 MED ORDER — LACTATED RINGERS IV SOLN
INTRAVENOUS | Status: DC | PRN
  Administered 2016-01-19 (×3): via INTRAVENOUS

## 2016-01-19 MED ORDER — ONDANSETRON HCL 4 MG/2ML IJ SOLN: 4.0000 mg | Freq: Four times a day (QID) | INTRAMUSCULAR | Status: DC | PRN

## 2016-01-19 MED ORDER — OXYCODONE HCL 5 MG/5ML PO SOLN: 5.0000 mg | Freq: Once | ORAL | Status: DC | PRN

## 2016-01-19 MED ORDER — SODIUM CHLORIDE 0.9 % IV SOLN: INTRAVENOUS | Status: DC

## 2016-01-19 MED ORDER — CETYLPYRIDINIUM CHLORIDE 0.05 % MT LIQD: 7.0000 mL | Freq: Two times a day (BID) | OROMUCOSAL | Status: DC

## 2016-01-19 MED ORDER — SODIUM CHLORIDE 0.9% FLUSH
3.0000 mL | Freq: Two times a day (BID) | INTRAVENOUS | Status: DC
  Administered 2016-01-19: 3 mL via INTRAVENOUS

## 2016-01-19 MED ORDER — ZOLPIDEM TARTRATE 5 MG PO TABS
5.0000 mg | ORAL_TABLET | Freq: Every evening | ORAL | Status: DC | PRN
  Administered 2016-01-20: 5 mg via ORAL
  Filled 2016-01-19: qty 1

## 2016-01-19 MED ORDER — FENTANYL CITRATE (PF) 100 MCG/2ML IJ SOLN: 25.0000 ug | INTRAMUSCULAR | Status: DC | PRN

## 2016-01-19 MED ORDER — HEPARIN SODIUM (PORCINE) 1000 UNIT/ML IJ SOLN
INTRAMUSCULAR | Status: AC
  Filled 2016-01-19: qty 1

## 2016-01-19 MED ORDER — BUPIVACAINE HCL (PF) 0.25 % IJ SOLN
INTRAMUSCULAR | Status: DC | PRN
  Administered 2016-01-19: 8 mL

## 2016-01-19 MED ORDER — EPHEDRINE SULFATE 50 MG/ML IJ SOLN
INTRAMUSCULAR | Status: DC | PRN
  Administered 2016-01-19: 10 mg via INTRAVENOUS

## 2016-01-19 MED ORDER — FENTANYL CITRATE (PF) 100 MCG/2ML IJ SOLN
INTRAMUSCULAR | Status: AC
  Filled 2016-01-19: qty 2

## 2016-01-19 MED ORDER — BUTAMBEN-TETRACAINE-BENZOCAINE 2-2-14 % EX AERO
INHALATION_SPRAY | CUTANEOUS | Status: DC | PRN
  Administered 2016-01-19: 2 via TOPICAL

## 2016-01-19 MED ORDER — DEXAMETHASONE SODIUM PHOSPHATE 4 MG/ML IJ SOLN
INTRAMUSCULAR | Status: DC | PRN
  Administered 2016-01-19: 8 mg via INTRAVENOUS

## 2016-01-19 MED ORDER — HEPARIN SODIUM (PORCINE) 1000 UNIT/ML IJ SOLN
INTRAMUSCULAR | Status: DC | PRN
  Administered 2016-01-19: 12000 [IU] via INTRAVENOUS
  Administered 2016-01-19: 2000 [IU] via INTRAVENOUS

## 2016-01-19 MED ORDER — PROPOFOL 10 MG/ML IV BOLUS
INTRAVENOUS | Status: DC | PRN
  Administered 2016-01-19: 100 mg via INTRAVENOUS

## 2016-01-19 MED ORDER — IOHEXOL 350 MG/ML SOLN
INTRAVENOUS | Status: DC | PRN
  Administered 2016-01-19: 5 mL via INTRAVENOUS
  Administered 2016-01-19: 100 mL via INTRAVENOUS

## 2016-01-19 MED ORDER — HYDROCODONE-ACETAMINOPHEN 5-325 MG PO TABS: 1.0000 | ORAL_TABLET | ORAL | Status: DC | PRN

## 2016-01-19 MED ORDER — PREDNISONE 1 MG PO TABS
3.0000 mg | ORAL_TABLET | Freq: Every day | ORAL | Status: DC
  Administered 2016-01-20: 3 mg via ORAL
  Filled 2016-01-19: qty 3

## 2016-01-19 MED ORDER — ACETAMINOPHEN 325 MG PO TABS: 650.0000 mg | ORAL_TABLET | ORAL | Status: DC | PRN

## 2016-01-19 MED ORDER — ALBUMIN HUMAN 5 % IV SOLN
INTRAVENOUS | Status: DC | PRN
  Administered 2016-01-19: 14:00:00 via INTRAVENOUS

## 2016-01-19 MED ORDER — RIVAROXABAN 20 MG PO TABS: 20.0000 mg | ORAL_TABLET | Freq: Every day | ORAL | Status: DC

## 2016-01-19 MED ORDER — ONDANSETRON HCL 4 MG/2ML IJ SOLN: 4.0000 mg | Freq: Once | INTRAMUSCULAR | Status: DC | PRN

## 2016-01-19 SURGICAL SUPPLY — 22 items
BAG SNAP BAND KOVER 36X36 (MISCELLANEOUS) ×3 IMPLANT
BLANKET WARM UNDERBOD FULL ACC (MISCELLANEOUS) ×3 IMPLANT
CATH DIAG 6FR PIGTAIL (CATHETERS) ×2 IMPLANT
CATH NAVISTAR SMARTTOUCH DF (ABLATOR) ×2 IMPLANT
CATH SOUNDSTAR 3D IMAGING (CATHETERS) ×2 IMPLANT
CATH VARIABLE LASSO NAV 2515 (CATHETERS) ×6 IMPLANT
CATH WEBSTER BI DIR CS D-F CRV (CATHETERS) ×2 IMPLANT
COVER SWIFTLINK CONNECTOR (BAG) ×3 IMPLANT
NDL TRANSEP BRK 71CM 407200 (NEEDLE) IMPLANT
NEEDLE TRANSEP BRK 71CM 407200 (NEEDLE) ×3 IMPLANT
PACK EP LATEX FREE (CUSTOM PROCEDURE TRAY) ×3
PACK EP LF (CUSTOM PROCEDURE TRAY) ×1 IMPLANT
PAD DEFIB LIFELINK (PAD) ×3 IMPLANT
PATCH CARTO3 (PAD) ×2 IMPLANT
SHEATH AVANTI 11F 11CM (SHEATH) ×2 IMPLANT
SHEATH PINNACLE 7F 10CM (SHEATH) ×4 IMPLANT
SHEATH PINNACLE 9F 10CM (SHEATH) ×2 IMPLANT
SHEATH SWARTZ TS SL2 63CM 8.5F (SHEATH) ×2 IMPLANT
SHIELD RADPAD SCOOP 12X17 (MISCELLANEOUS) ×3 IMPLANT
SYR MEDRAD MARK V 150ML (SYRINGE) ×3 IMPLANT
TUBING CONTRAST HIGH PRESS 48 (TUBING) ×2 IMPLANT
TUBING SMART ABLATE COOLFLOW (TUBING) ×4 IMPLANT

## 2016-01-19 NOTE — Anesthesia Preprocedure Evaluation (Signed)
Anesthesia Evaluation  Patient identified by MRN, date of birth, ID band Patient awake    Reviewed: Allergy & Precautions, NPO status , Patient's Chart, lab work & pertinent test results  Airway Mallampati: II  TM Distance: >3 FB Neck ROM: Full    Dental  (+) Teeth Intact, Dental Advisory Given   Pulmonary    breath sounds clear to auscultation       Cardiovascular hypertension,  Rhythm:Regular Rate:Normal     Neuro/Psych    GI/Hepatic   Endo/Other    Renal/GU      Musculoskeletal   Abdominal   Peds  Hematology   Anesthesia Other Findings   Reproductive/Obstetrics                             Anesthesia Physical Anesthesia Plan  ASA: III  Anesthesia Plan: General   Post-op Pain Management:    Induction: Intravenous  Airway Management Planned: LMA  Additional Equipment:   Intra-op Plan:   Post-operative Plan:   Informed Consent: I have reviewed the patients History and Physical, chart, labs and discussed the procedure including the risks, benefits and alternatives for the proposed anesthesia with the patient or authorized representative who has indicated his/her understanding and acceptance.   Dental advisory given  Plan Discussed with: CRNA and Anesthesiologist  Anesthesia Plan Comments:         Anesthesia Quick Evaluation  

## 2016-01-19 NOTE — CV Procedure (Signed)
    Transesophageal Echocardiogram Note  Lorraine Little XY:[id number] [birth year]  Procedure: Transesophageal Echocardiogram Indications: atrial fib , pre-ablation   Procedure Details Consent: Obtained Time Out: Verified patient identification, verified procedure, site/side was marked, verified correct patient position, special equipment/implants available, Radiology Safety Procedures followed,  medications/allergies/relevent history reviewed, required imaging and test results available.  Performed  Medications:  During this procedure the patient is administered a total of Versed 3  mg and Fentanyl 50  mcg  to achieve and maintain moderate conscious sedation.  The patient's heart rate, blood pressure, and oxygen saturation are monitored continuously during the procedure. The period of conscious sedation is 30  minutes, of which I was present face-to-face 100% of this time.  Left Ventrical:  Normal LV function   Mitral Valve: moderate MR   Aortic Valve: normal   Tricuspid Valve: normal   Pulmonic Valve: normal   Left Atrium/ Left atrial appendage: large LA and LAA.   No thrombi  Atrial septum: no evidence of ASD or PFO by color doppler   Aorta: mild calcified atherosclerotic plaque   Complications: No apparent complications Patient did tolerate procedure well.   Thayer Headings, Lorraine Little., MD, [facility] 01/19/2016, 9:51 AM

## 2016-01-19 NOTE — H&P (Signed)
PCP: Jerlyn Ly, MD  Primary Electrophysiologist: Dr Caryl Comes  Chief Complaint  Patient presents with  . Atrial Fibrillation    History of Present Illness: Lorraine Little is a 75 y.o. female who presents today for afib ablation. The patient has persistent atrial fibrillation. She has had ongoing difficulty with afib even despite medical therapy with amiodarone. She saw me several years ago (with paroxysmal atrial fibrillation) and declined ablation at that time. She recently underwent cardioversion which failed to keep her in sinus rhythm for very long. She reports fatigue and decreased exercise tolerance in afib. She has significant fatigue in afib. She has occasional palpitations.  Today, she denies symptoms of chest pain, shortness of breath, orthopnea, PND, lower extremity edema, claudication, dizziness, presyncope, syncope, bleeding, or neurologic sequela. The patient is tolerating medications without difficulties and is otherwise without complaint today.    Past Medical History  Diagnosis Date  . Polymyalgia rheumatica ([place])     a. On chronic steroids  . Hypertension   . GERD (gastroesophageal reflux disease)     pt denies symptoms with this  . Migraine headache   . Cervical disc disease   . Osteoporosis   . Glaucoma   . Osteoarthritis   . Persistent atrial fibrillation ([place])     a. Diagnosed 07/2011; b. Normal nuclear myoview 07/2011 and normal EF at that time; c. Most recent echo 2/20 /13 - EF 55-60% wit mild-mod MR.; d. s/p DCCV 01/12/2012  . Hyperlipidemia     a. Diagnosed 12/2011  . CVA (cerebral infarction)     a. 11/2011  . Diverticulosis   . Internal hemorrhoid   . Tubular adenoma of colon 04/2007  . Adrenal insufficiency ([facility])     takes prednisone 5mg  daily  . Depression   . Insomnia with sleep apnea    Past Surgical History  Procedure Laterality Date  .  Breast lumpectomy  1980's    right x2 (benign)  . Tonsillectomy  age 20  . Cardioversion  01/12/2012    Procedure: CARDIOVERSION; Surgeon: Lelon Perla, MD; Location: [place]; Service: Cardiovascular; Laterality: N/A;  . Cardioversion  03/28/2012    Procedure: CARDIOVERSION; Surgeon: Larey Dresser, MD; Location: [place]; Service: Cardiovascular; Laterality: N/A;  . Cardioversion  06/29/2012    Procedure: CARDIOVERSION; Surgeon: Darlin Coco, MD; Location: [facility] ENDOSCOPY; Service: Cardiovascular; Laterality: N/A;  . Glaucoma surgery    . Cardioversion N/A 12/07/2015    Procedure: CARDIOVERSION; Surgeon: Josue Hector, MD; Location[facility] ENDOSCOPY; Service: Cardiovascular; Laterality: N/A;   . [DISCONTINUED] flecainide (TAMBOCOR) 100 MG tablet Take 100mg  1 tablet prn onset of atrial fib. 30 tablet 6   No current facility-administered medications for this visit.    Allergies: Review of patient's allergies indicates no known allergies.   Social History: The patient  reports that she has never smoked. She has never used smokeless tobacco. She reports that she drinks about 0.6 oz of alcohol per week. She reports that she does not use illicit drugs.   Family History: The patient's family history includes Colon cancer in her paternal grandmother; Heart failure in her mother; Liver cancer in her father; Stroke in her mother.    ROS: Please see the history of present illness. All other systems are reviewed and negative.    PHYSICAL EXAM: Filed Vitals:   01/19/16 0657  BP: 136/76  Pulse: 106  Temp: 99.3 F (37.4 C)  Resp: 16   GEN: Well nourished, well developed, in no acute distress  HEENT: normal  Neck: no JVD, carotid bruits, or masses Cardiac: iRRR; no murmurs, rubs, or gallops,no edema  Respiratory: clear to auscultation bilaterally, normal work of breathing GI: soft, nontender, nondistended, + BS MS: no  deformity or atrophy  Skin: warm and dry  Neuro: Strength and sensation are intact Psych: euthymic mood, full affect     Lipid Panel           Wt Readings from Last 3 Encounters:  12/10/15 121 lb 3.2 oz (54.976 kg)  11/30/15 121 lb 9.6 oz (55.157 kg)  08/20/15 120 lb (54.432 kg)    ASSESSMENT AND PLAN:  1. Persistent atrial fibrillation Refractory to medical therapy with amiodarone. Quite symptomatic Therapeutic strategies for afib including medicine and ablation were discussed in detail with the patient today. Risk, benefits, and alternatives to EP study and radiofrequency ablation for afib were also discussed in detail today. These risks include but are not limited to stroke, bleeding, vascular damage, tamponade, perforation, damage to the esophagus, lungs, and other structures, pulmonary vein stenosis, worsening renal function, and death. The patient understands these risk and wishes to proceed.  Reports compliance with xarelto without interruption.  Will plan TEE prior to ablation.  Thompson Grayer MD, [facility] 01/19/2016 7:47 AM

## 2016-01-19 NOTE — Progress Notes (Signed)
  Echocardiogram Echocardiogram Transesophageal has been performed.  Darlina Sicilian M 01/19/2016, 10:03 AM

## 2016-01-19 NOTE — Discharge Summary (Signed)
ELECTROPHYSIOLOGY PROCEDURE DISCHARGE SUMMARY    Patient ID: Lorraine Little,  MRN: [id number], DOB/AGE: [birth year] 75 y.o.  Admit date: 01/19/2016 Discharge date: 01/20/2016  Primary Care Physician: Jerlyn Ly, MD Electrophysiologist: Caryl Comes  Primary Discharge Diagnosis:  Persistent atrial fibrillation status post ablation this admission  Secondary Discharge Diagnosis:  1.  Polymyalgia rheumatica 2.  HTN 3.  GERD 4.  Osteoarthritis 5.  Hyperlipidemia 6.  Prior CVA  Procedures This Admission:  1.  Electrophysiology study and radiofrequency catheter ablation on 01/19/16 by Dr Thompson Grayer.  This study demonstrated atrial fibrillation upon presentation; rotational Angiography reveals an enlarged left atrium with four separate pulmonary veins without evidence of pulmonary vein stenosis; successful electrical isolation and anatomical encircling of all four pulmonary veins with radiofrequency current; additional ablation performed along the posterior wall of the left atrium in order to create a standard "box" lesion; atrial fibrillation converted to sinus rhythm during ablation; no early apparent complications..    Brief HPI: [name] is a 75 y.o. female with a history of persistent atrial fibrillation.  They have failed medical therapy with amiodarone. Risks, benefits, and alternatives to catheter ablation of atrial fibrillation were reviewed with the patient who wished to proceed.  The patient underwent TEE prior to the procedure which demonstrated normal LV function and no LAA thrombus.    Hospital Course:  The patient was admitted and underwent EPS/RFCA of atrial fibrillation with details as outlined above.  They were monitored on telemetry overnight which demonstrated sinus rhythm.  Groin was without complication on the day of discharge.  The patient was examined and considered to be stable for discharge.  Wound care and restrictions were reviewed with the patient.  The  patient will be seen back by Roderic Palau, NP in 4 weeks and Dr Rayann Heman in 12 weeks for post ablation follow up.   This patients CHA2DS2-VASc Score and unadjusted Ischemic Stroke Rate (% per year) is equal to 9.7 % stroke rate/year from a score of 6 Above score calculated as 1 point each if present [CHF, HTN, DM, Vascular=MI/PAD/Aortic Plaque, Age if 65-74, or Female] Above score calculated as 2 points each if present [Age > 75, or Stroke/TIA/TE]   Physical Exam: Filed Vitals:   01/20/16 0400 01/20/16 0600 01/20/16 0700 01/20/16 0800  BP: 90/50 94/50  102/53  Pulse: 76 74 75 78  Temp:      TempSrc:      Resp: 13 13 17 14   Height:      Weight:      SpO2: 95% 97% 98% 96%    GEN- The patient is elderly appearing, alert and oriented x 3 today.   HEENT: normocephalic, atraumatic; sclera clear, conjunctiva pink; hearing intact; oropharynx clear; neck supple  Lungs- normal work of breathing, scattered wheezes Heart- Regular rate and rhythm, no murmurs, rubs or gallops  GI- soft, non-tender, non-distended, bowel sounds present  Extremities- no clubbing, cyanosis, or edema; DP/PT/radial pulses 2+ bilaterally, groin without hematoma/bruit MS- no significant deformity or atrophy Skin- warm and dry, no rash or lesion Psych- euthymic mood, full affect Neuro- strength and sensation are intact   Labs:   Lab Results  Component Value Date   WBC 8.5 01/12/2016   HGB 13.6 01/12/2016   HCT 38.7 01/12/2016   MCV 91.3 01/12/2016   PLT 326 01/12/2016     Recent Labs Lab 01/20/16 0250  NA 134*  K 3.8  CL 105  CO2 25  BUN 9  CREATININE  0.91  CALCIUM 7.9*  GLUCOSE 141*     Discharge Medications:    Medication List    TAKE these medications        alendronate 70 MG tablet  Commonly known as:  FOSAMAX  Take 70 mg by mouth every 7 (seven) days. Take with a full glass of water on an empty stomach.     amiodarone 200 MG tablet  Commonly known as:  PACERONE  Take 1 tablet (200 mg  total) by mouth daily.     amLODipine 5 MG tablet  Commonly known as:  NORVASC  TAKE 1 TABLET (5 MG TOTAL) BY MOUTH DAILY.     atorvastatin 80 MG tablet  Commonly known as:  LIPITOR  TAKE 1 TABLET BY MOUTH EVERY DAY     Calcium-Vitamin D 600-200 MG-UNIT tablet  Take 1 tablet by mouth daily.     CO Q 10 PO  Take 1 tablet by mouth daily.     ferrous sulfate 325 (65 FE) MG tablet  Take 325 mg by mouth daily with breakfast.     FIBER PO  Take 1 tablet by mouth daily.     fish oil-omega-3 fatty acids 1000 MG capsule  Take 1 g by mouth 2 (two) times daily.     gabapentin 100 MG capsule  Commonly known as:  NEURONTIN  TAKE ONE CAPSULE BY MOUTH IN THE MORNING ,1 TABLET MID AFTERNOON & 2 TABLETS AT BEDTIME     Melatonin 3 MG Tabs  Take 6 mg by mouth at bedtime.     metoprolol succinate 25 MG 24 hr tablet  Commonly known as:  TOPROL XL  Take 1 tablet (25 mg total) by mouth daily. May take 1/2 tablet for afib heart rate >100     omeprazole 20 MG capsule  Commonly known as:  PRILOSEC  Take 1 capsule (20 mg total) by mouth daily.     polyethylene glycol packet  Commonly known as:  MIRALAX / GLYCOLAX  Take 17 g by mouth daily.     Potassium Chloride ER 20 MEQ Tbcr  Take 20 mEq by mouth daily.     predniSONE 1 MG tablet  Commonly known as:  DELTASONE  Take 3 mg by mouth daily with breakfast.     rivaroxaban 20 MG Tabs tablet  Commonly known as:  XARELTO  Take 20 mg by mouth daily with supper.     SENNA LAX PO  Take 2 tablets by mouth at bedtime as needed. For constipation     STOOL SOFTENER PLUS PO  Take 1 tablet by mouth daily.     valsartan-hydrochlorothiazide 320-25 MG tablet  Commonly known as:  DIOVAN-HCT  Take 1 tablet by mouth daily.     Vitamin D-3 5000 UNITS Tabs  Take 1 tablet by mouth daily. 1 tab daily     zaleplon 5 MG capsule  Commonly known as:  SONATA  Take 1 capsule (5 mg total) by mouth at bedtime as needed for sleep.        Disposition:    Discharge Instructions    Diet - low sodium heart healthy    Complete by:  As directed      Discharge instructions    Complete by:  As directed   No driving for 4 days. No lifting over 5 lbs for 1 week. No sexual activity for 1 week. You may return to work in 1 week. Keep procedure site clean & dry. If you notice increased  pain, swelling, bleeding or pus, call/return!  You may shower, but no soaking baths/hot tubs/pools for 1 week.     Increase activity slowly    Complete by:  As directed           Follow-up Information    Follow up with [facility] On 02/18/2016.   Specialty:  Cardiology   Why:  at [facility] information:   [id number] [place] [phone]      Follow up with Thompson Grayer, MD On 04/20/2016.   Specialty:  Cardiology   Why:  at [facility] information:   [address] [address] [phone]       Duration of Discharge Encounter: Greater than 30 minutes including physician time.  Signed, Chanetta Marshall, NP 01/20/2016 8:06 AM   I have seen, examined the patient, and reviewed the above assessment and plan.  On exam, RRR. Changes to above are made where necessary.    Co Sign: Thompson Grayer, MD 01/20/2016 8:58 AM

## 2016-01-19 NOTE — Anesthesia Postprocedure Evaluation (Signed)
Anesthesia Post Note  Patient: Lorraine Little  Procedure(s) Performed: Procedure(s) (LRB): Atrial Fibrillation Ablation (N/A)  Patient location during evaluation: PACU Anesthesia Type: General Level of consciousness: awake and alert Pain management: pain level controlled Vital Signs Assessment: post-procedure vital signs reviewed and stable Respiratory status: spontaneous breathing, nonlabored ventilation, respiratory function stable and patient connected to nasal cannula oxygen Cardiovascular status: blood pressure returned to baseline and stable Postop Assessment: no signs of nausea or vomiting Anesthetic complications: no    Last Vitals:  Filed Vitals:   01/19/16 1705 01/19/16 1710  BP: 117/59 115/54  Pulse: 79 79  Temp:    Resp: 16 0    Last Pain: There were no vitals filed for this visit.               [name],W. EDMOND

## 2016-01-19 NOTE — Interval H&P Note (Signed)
History and Physical Interval Note:  01/19/2016 8:01 AM  Lorraine Little  has presented today for surgery, with the diagnosis of AFIB  The various methods of treatment have been discussed with the patient and family. After consideration of risks, benefits and other options for treatment, the patient has consented to  Procedure(s): TRANSESOPHAGEAL ECHOCARDIOGRAM (TEE) (N/A) as a surgical intervention .  The patient's history has been reviewed, patient examined, no change in status, stable for surgery.  I have reviewed the patient's chart and labs.  Questions were answered to the patient's satisfaction.     Nahser, Wonda Cheng

## 2016-01-19 NOTE — Anesthesia Procedure Notes (Signed)
Procedure Name: LMA Insertion Date/Time: 01/19/2016 12:50 PM Performed by: Layla Maw Pre-anesthesia Checklist: Patient identified, Patient being monitored, Timeout performed, Emergency Drugs available and Suction available Patient Re-evaluated:Patient Re-evaluated prior to inductionOxygen Delivery Method: Circle System Utilized Preoxygenation: Pre-oxygenation with 100% oxygen Intubation Type: IV induction Ventilation: Mask ventilation without difficulty LMA: LMA inserted LMA Size: 4.0 Number of attempts: 1 Placement Confirmation: positive ETCO2 and breath sounds checked- equal and bilateral Tube secured with: Tape Dental Injury: Teeth and Oropharynx as per pre-operative assessment

## 2016-01-19 NOTE — Progress Notes (Signed)
Site area: rt fem venous sheaths 67fr. 40fr, 61fr Site Prior to Removal:  Level 0 Pressure Applied For:49min Manual:   Manual hold Patient Status During Pull:  A/O Post Pull Site:  Level 0 Post Pull Instructions Given:  Post sheath removal instructions given and pt understands Post Pull Pulses Present: 2+ rt dp/pt Dressing Applied:  tegaderm and a 4x4 Bedrest begins @ 17:30:00 Comments:

## 2016-01-19 NOTE — Transfer of Care (Signed)
Immediate Anesthesia Transfer of Care Note  Patient: Lorraine Little  Procedure(s) Performed: Procedure(s): Atrial Fibrillation Ablation (N/A)  Patient Location: PACU  Anesthesia Type:General  Level of Consciousness: awake, alert , oriented and patient cooperative  Airway & Oxygen Therapy: Patient Spontanous Breathing and Patient connected to nasal cannula oxygen  Post-op Assessment: Report given to RN, Post -op Vital signs reviewed and stable and Patient moving all extremities X 4  Post vital signs: Reviewed and stable  Last Vitals:  Filed Vitals:   01/19/16 1150 01/19/16 1200  BP: 99/44 104/49  Pulse: 66 85  Temp:    Resp: 17 14    Complications: No apparent anesthesia complications

## 2016-01-20 ENCOUNTER — Encounter (HOSPITAL_COMMUNITY): Payer: Self-pay | Admitting: Internal Medicine

## 2016-01-20 DIAGNOSIS — I10 Essential (primary) hypertension: Secondary | ICD-10-CM | POA: Diagnosis not present

## 2016-01-20 DIAGNOSIS — E785 Hyperlipidemia, unspecified: Secondary | ICD-10-CM | POA: Diagnosis not present

## 2016-01-20 DIAGNOSIS — K219 Gastro-esophageal reflux disease without esophagitis: Secondary | ICD-10-CM | POA: Diagnosis not present

## 2016-01-20 DIAGNOSIS — I481 Persistent atrial fibrillation: Secondary | ICD-10-CM

## 2016-01-20 LAB — BASIC METABOLIC PANEL
ANION GAP: 4 — AB (ref 5–15)
BUN: 9 mg/dL (ref 6–20)
CHLORIDE: 105 mmol/L (ref 101–111)
CO2: 25 mmol/L (ref 22–32)
CREATININE: 0.91 mg/dL (ref 0.44–1.00)
Calcium: 7.9 mg/dL — ABNORMAL LOW (ref 8.9–10.3)
GFR calc non Af Amer: 60 mL/min — ABNORMAL LOW (ref >60)
Glucose, Bld: 141 mg/dL — ABNORMAL HIGH (ref 65–99)
Potassium: 3.8 mmol/L (ref 3.5–5.1)
SODIUM: 134 mmol/L — AB (ref 135–145)

## 2016-01-20 NOTE — Discharge Instructions (Signed)
You have an appointment set up with the Atrial Fibrillation Clinic.  Multiple studies have shown that being followed by a dedicated atrial fibrillation clinic in addition to the standard care you receive from your other physicians improves health. We believe that enrollment in the atrial fibrillation clinic will allow us to better care for you.  ° °The phone number to the Atrial Fibrillation Clinic is [phone]. The clinic is staffed Monday through Friday from 8:30am to 5pm. ° °Parking Directions: The clinic is located in the Heart and Vascular Building connected to [facility]. °1)From [address] turn on to [address] and go to the 3rd entrance  (Heart and Vascular entrance) on the right. °2)Look to the right for Heart &Vascular Parking Garage. °3)A code for the entrance is required please call the clinic to receive this.   °4)Take the elevators to the 1st floor. Registration is in the room with the glass walls at the end of the hallway. ° °If you have any trouble parking or locating the clinic, please don’t hesitate to call [phone]. ° °Information on my medicine - XARELTO® (Rivaroxaban) ° °Why was Xarelto® prescribed for you? °Xarelto® was prescribed for you to reduce the risk of a blood clot forming that can cause a stroke if you have a medical condition called atrial fibrillation (a type of irregular heartbeat). ° °What do you need to know about xarelto® ? °Take your Xarelto® ONCE DAILY at the same time every day with your evening meal. °If you have difficulty swallowing the tablet whole, you may crush it and mix in applesauce just prior to taking your dose. ° °Take Xarelto® exactly as prescribed by your doctor and DO NOT stop taking Xarelto® without talking to the doctor who prescribed the medication.  Stopping without other stroke prevention medication to take the place of Xarelto® may increase your risk of developing a clot that causes a stroke.  Refill your prescription before you  run out. ° °After discharge, you should have regular check-up appointments with your healthcare provider that is prescribing your Xarelto®.  In the future your dose may need to be changed if your kidney function or weight changes by a significant amount. ° °What do you do if you miss a dose? °If you are taking Xarelto® ONCE DAILY and you miss a dose, take it as soon as you remember on the same day then continue your regularly scheduled once daily regimen the next day. Do not take two doses of Xarelto® at the same time or on the same day.  ° °Important Safety Information °A possible side effect of Xarelto® is bleeding. You should call your healthcare provider right away if you experience any of the following: °? Bleeding from an injury or your nose that does not stop. °? Unusual colored urine (red or dark brown) or unusual colored stools (red or black). °? Unusual bruising for unknown reasons. °? A serious fall or if you hit your head (even if there is no bleeding). ° °Some medicines may interact with Xarelto® and might increase your risk of bleeding while on Xarelto®. To help avoid this, consult your healthcare provider or pharmacist prior to using any new prescription or non-prescription medications, including herbals, vitamins, non-steroidal anti-inflammatory drugs (NSAIDs) and supplements. ° °This website has more information on Xarelto®: www.xarelto.com. ° °

## 2016-01-20 NOTE — Progress Notes (Signed)
Dc instructions given to pt at this time  Pt verbalized understanding of all instructions.  

## 2016-01-21 ENCOUNTER — Telehealth (HOSPITAL_COMMUNITY): Payer: Self-pay | Admitting: Nurse Practitioner

## 2016-01-21 NOTE — Telephone Encounter (Signed)
Mrs. Fanti has an ablation and was discharged yesterday. She called in this am and to tell me that she awoke at 7am with migraine and chest pain that she felt into her back. She took two ibuprofen's and now the pain has eased off from both h/a and chest discomfort. I offered her an appointment for this am and she declined because she is feeling better. I told her if the pain comes back, I want to see her in the clinic and she told me she will call back for appointment if it should reoccur. I spoke with Dr. Rayann Heman who feels like this  can occur with ablation, and should resolve. He feels that she can be seen in clinic, if need be, rather that the ER.

## 2016-01-21 NOTE — Telephone Encounter (Signed)
Spoke to pt this pm ans she has not had any return of chest pain. She feels a little fullness in her throat and has noted mild cough and some mild shortness of breath. I offered friday am appointment and she said if she felt worse in the am she would call for an appointment. I assured her that I have had other ablation pts have similiar symptoms early on after ablation.

## 2016-01-25 DIAGNOSIS — R8299 Other abnormal findings in urine: Secondary | ICD-10-CM | POA: Diagnosis not present

## 2016-01-25 DIAGNOSIS — E784 Other hyperlipidemia: Secondary | ICD-10-CM | POA: Diagnosis not present

## 2016-01-25 DIAGNOSIS — I10 Essential (primary) hypertension: Secondary | ICD-10-CM | POA: Diagnosis not present

## 2016-01-25 DIAGNOSIS — M810 Age-related osteoporosis without current pathological fracture: Secondary | ICD-10-CM | POA: Diagnosis not present

## 2016-01-26 NOTE — Telephone Encounter (Signed)
Completed PA for sonata and sent to [facility] plan.

## 2016-01-26 NOTE — Telephone Encounter (Signed)
Pt spoke with her pharmacy and they are telling her they are still waiting on the PA. Pt does not know who silvers[facility] is. Please call and update the pt [phone]

## 2016-01-26 NOTE — Telephone Encounter (Signed)
PA for zaleplon approved from 01/26/2016 to 10/30/2016. I called pt and advised her and faxed a copy of the approval to pt's pharmacy. Pt verbalized understanding.

## 2016-01-27 ENCOUNTER — Other Ambulatory Visit (HOSPITAL_COMMUNITY): Payer: Self-pay | Admitting: Nurse Practitioner

## 2016-01-28 ENCOUNTER — Encounter: Payer: Self-pay | Admitting: Internal Medicine

## 2016-01-28 DIAGNOSIS — Z1231 Encounter for screening mammogram for malignant neoplasm of breast: Secondary | ICD-10-CM | POA: Diagnosis not present

## 2016-01-28 DIAGNOSIS — D6489 Other specified anemias: Secondary | ICD-10-CM | POA: Diagnosis not present

## 2016-01-28 DIAGNOSIS — I480 Paroxysmal atrial fibrillation: Secondary | ICD-10-CM | POA: Diagnosis not present

## 2016-01-28 DIAGNOSIS — I10 Essential (primary) hypertension: Secondary | ICD-10-CM | POA: Diagnosis not present

## 2016-01-28 DIAGNOSIS — Z0000 Encounter for general adult medical examination without abnormal findings: Secondary | ICD-10-CM | POA: Diagnosis not present

## 2016-01-28 DIAGNOSIS — Z1389 Encounter for screening for other disorder: Secondary | ICD-10-CM | POA: Diagnosis not present

## 2016-01-28 DIAGNOSIS — K5909 Other constipation: Secondary | ICD-10-CM | POA: Diagnosis not present

## 2016-01-28 DIAGNOSIS — M810 Age-related osteoporosis without current pathological fracture: Secondary | ICD-10-CM | POA: Diagnosis not present

## 2016-01-28 DIAGNOSIS — E784 Other hyperlipidemia: Secondary | ICD-10-CM | POA: Diagnosis not present

## 2016-01-28 DIAGNOSIS — Z6821 Body mass index (BMI) 21.0-21.9, adult: Secondary | ICD-10-CM | POA: Diagnosis not present

## 2016-01-28 DIAGNOSIS — I6340 Cerebral infarction due to embolism of unspecified cerebral artery: Secondary | ICD-10-CM | POA: Diagnosis not present

## 2016-01-28 DIAGNOSIS — M353 Polymyalgia rheumatica: Secondary | ICD-10-CM | POA: Diagnosis not present

## 2016-02-03 DIAGNOSIS — M810 Age-related osteoporosis without current pathological fracture: Secondary | ICD-10-CM | POA: Diagnosis not present

## 2016-02-17 DIAGNOSIS — Z1231 Encounter for screening mammogram for malignant neoplasm of breast: Secondary | ICD-10-CM | POA: Diagnosis not present

## 2016-02-18 ENCOUNTER — Ambulatory Visit (HOSPITAL_COMMUNITY)
Admission: RE | Admit: 2016-02-18 | Discharge: 2016-02-18 | Disposition: A | Discharge status: Home / Self Care | Payer: PPO | Source: Ambulatory Visit | Attending: Nurse Practitioner | Admitting: Nurse Practitioner

## 2016-02-18 ENCOUNTER — Encounter (HOSPITAL_COMMUNITY): Payer: Self-pay | Admitting: Nurse Practitioner

## 2016-02-18 VITALS — BP 122/60 | HR 82 | Ht 62.0 in | Wt 120.6 lb

## 2016-02-18 DIAGNOSIS — I4891 Unspecified atrial fibrillation: Secondary | ICD-10-CM | POA: Diagnosis not present

## 2016-02-18 DIAGNOSIS — Z79899 Other long term (current) drug therapy: Secondary | ICD-10-CM | POA: Diagnosis not present

## 2016-02-18 DIAGNOSIS — M1990 Unspecified osteoarthritis, unspecified site: Secondary | ICD-10-CM | POA: Insufficient documentation

## 2016-02-18 DIAGNOSIS — E785 Hyperlipidemia, unspecified: Secondary | ICD-10-CM | POA: Diagnosis not present

## 2016-02-18 DIAGNOSIS — Z8249 Family history of ischemic heart disease and other diseases of the circulatory system: Secondary | ICD-10-CM | POA: Diagnosis not present

## 2016-02-18 DIAGNOSIS — M353 Polymyalgia rheumatica: Secondary | ICD-10-CM | POA: Insufficient documentation

## 2016-02-18 DIAGNOSIS — Z823 Family history of stroke: Secondary | ICD-10-CM | POA: Diagnosis not present

## 2016-02-18 DIAGNOSIS — Z7983 Long term (current) use of bisphosphonates: Secondary | ICD-10-CM | POA: Insufficient documentation

## 2016-02-18 DIAGNOSIS — Z7901 Long term (current) use of anticoagulants: Secondary | ICD-10-CM | POA: Insufficient documentation

## 2016-02-18 DIAGNOSIS — I481 Persistent atrial fibrillation: Secondary | ICD-10-CM | POA: Diagnosis not present

## 2016-02-18 DIAGNOSIS — Z7952 Long term (current) use of systemic steroids: Secondary | ICD-10-CM | POA: Insufficient documentation

## 2016-02-18 DIAGNOSIS — I10 Essential (primary) hypertension: Secondary | ICD-10-CM | POA: Diagnosis not present

## 2016-02-18 DIAGNOSIS — H409 Unspecified glaucoma: Secondary | ICD-10-CM | POA: Diagnosis not present

## 2016-02-18 DIAGNOSIS — M810 Age-related osteoporosis without current pathological fracture: Secondary | ICD-10-CM | POA: Diagnosis not present

## 2016-02-18 DIAGNOSIS — I4819 Other persistent atrial fibrillation: Secondary | ICD-10-CM

## 2016-02-18 NOTE — Progress Notes (Signed)
Patient ID: Lorraine Little, female   DOB: 1941/02/02, 75 y.o.   MRN: [id number]     Primary Care Physician: Lorraine Ly, MD Referring Physician: Dr. Juanetta Little Lorraine Little is a 75 y.o. female with a h/o persistent afib on amiodarone that underwent afib ablation 3/21, that is in afib clinic for f/u. She reports that the second morning that she woke up with severe chest pains but took motrin and the pain resolved within 2 hours. A couple of times since ablation her heart rate would increase right over 100, but she would take an extra metoprolol and it would return to normal. She feels that she is staying in SR. Denies groin pain, swallowing difficulties.  Today, she denies symptoms of palpitations, chest pain, shortness of breath, orthopnea, PND, lower extremity edema, dizziness, presyncope, syncope, or neurologic sequela. The patient is tolerating medications without difficulties and is otherwise without complaint today.   Past Medical History  Diagnosis Date  . Polymyalgia rheumatica ([place])     a. On chronic steroids  . Hypertension   . GERD (gastroesophageal reflux disease)     pt denies symptoms with this  . Migraine headache   . Cervical disc disease   . Osteoporosis   . Glaucoma   . Osteoarthritis   . Persistent atrial fibrillation ([place])     a.  Diagnosed 07/2011;  b. Normal nuclear myoview 07/2011 and normal EF at that time;  c. Most recent echo 2/20 /13 - EF 55-60% wit mild-mod MR.;  d. s/p DCCV 01/12/2012  . Hyperlipidemia     a. Diagnosed 12/2011  . CVA (cerebral infarction)     a. 11/2011  . Diverticulosis   . Internal hemorrhoid   . Tubular adenoma of colon 04/2007  . Adrenal insufficiency ([facility])     takes prednisone 5mg  daily  . Depression   . Insomnia with sleep apnea    Past Surgical History  Procedure Laterality Date  . Breast lumpectomy  1980's    right  x2 (benign)  . Tonsillectomy  age 1  . Cardioversion  01/12/2012    Procedure: CARDIOVERSION;  Surgeon: Lorraine Perla, MD;  Location: [facility];  Service: Cardiovascular;  Laterality: N/A;  . Cardioversion  03/28/2012    Procedure: CARDIOVERSION;  Surgeon: Lorraine Dresser, MD;  Location: [facility];  Service: Cardiovascular;  Laterality: N/A;  . Cardioversion  06/29/2012    Procedure: CARDIOVERSION;  Surgeon: Lorraine Coco, MD;  Location: [facility] ENDOSCOPY;  Service: Cardiovascular;  Laterality: N/A;  . Glaucoma surgery    . Cardioversion N/A 12/07/2015    Procedure: CARDIOVERSION;  Surgeon: Lorraine Hector, MD;  Location: [facility] ENDOSCOPY;  Service: Cardiovascular;  Laterality: N/A;  . Tee without cardioversion N/A 01/19/2016    Procedure: TRANSESOPHAGEAL ECHOCARDIOGRAM (TEE);  Surgeon: Lorraine Headings, MD;  Location: [facility];  Service: Cardiovascular;  Laterality: N/A;  . Electrophysiologic study N/A 01/19/2016    Procedure: Atrial Fibrillation Ablation;  Surgeon: Lorraine Grayer, MD;  Location: [place] [facility] CV LAB;  Service: Cardiovascular;  Laterality: N/A;    Current Outpatient Prescriptions  Medication Sig Dispense Refill  . alendronate (FOSAMAX) 70 MG tablet Take 70 mg by mouth every 7 (seven) days. Take with a full glass of water on an empty stomach.    Marland Kitchen amiodarone (PACERONE) 200 MG tablet Take 1 tablet (200 mg total) by mouth daily. 60 tablet 6  . amLODipine (NORVASC) 5 MG tablet TAKE 1 TABLET (5 MG TOTAL) BY MOUTH DAILY.  30 tablet 6  . atorvastatin (LIPITOR) 80 MG tablet TAKE 1 TABLET BY MOUTH EVERY DAY 30 tablet 10  . Calcium-Vitamin D 600-200 MG-UNIT per tablet Take 1 tablet by mouth daily.     Lorraine Little Sodium (STOOL SOFTENER PLUS PO) Take 1 tablet by mouth daily.     . Cholecalciferol (VITAMIN D-3) 5000 UNITS TABS Take 1 tablet by mouth daily. 1 tab daily    . Coenzyme Q10 (CO Q 10 PO) Take 1 tablet by mouth daily.    . ferrous sulfate 325 (65 FE) MG tablet Take 325 mg by mouth daily with breakfast.    . FIBER PO Take 1 tablet by mouth daily.    . fish oil-omega-3 fatty acids 1000 MG  capsule Take 1 g by mouth daily.     Marland Kitchen gabapentin (NEURONTIN) 100 MG capsule TAKE ONE CAPSULE BY MOUTH IN THE MORNING ,1 TABLET MID AFTERNOON & 2 TABLETS AT BEDTIME 120 capsule 6  . metoprolol succinate (TOPROL XL) 25 MG 24 hr tablet Take 1 tablet (25 mg total) by mouth daily. May take 1/2 tablet for afib heart rate >100 (Patient taking differently: Take 25 mg by mouth as needed. May take 1/2 tablet for afib heart rate >100) 30 tablet 1  . omeprazole (PRILOSEC) 20 MG capsule Take 1 capsule (20 mg total) by mouth daily. 90 capsule 3  . polyethylene glycol (MIRALAX / GLYCOLAX) packet Take 17 g by mouth daily.    . potassium chloride 20 MEQ TBCR Take 20 mEq by mouth daily. 35 tablet 1  . predniSONE (DELTASONE) 1 MG tablet Take 3 mg by mouth daily with breakfast.     . rivaroxaban (XARELTO) 20 MG TABS tablet Take 20 mg by mouth daily with supper.    . Sennosides (SENNA LAX PO) Take 2 tablets by mouth at bedtime as needed. For constipation    . valsartan-hydrochlorothiazide (DIOVAN-HCT) 320-25 MG per tablet Take 1 tablet by mouth daily.    . zaleplon (SONATA) 5 MG capsule Take 1 capsule (5 mg total) by mouth at bedtime as needed for sleep. 30 capsule 1  . [DISCONTINUED] flecainide (TAMBOCOR) 100 MG tablet Take 100mg  1 tablet prn onset of atrial fib. 30 tablet 6   No current facility-administered medications for this encounter.    No Known Allergies  Social History   Social History  . Marital Status: Married    Spouse Name: Lorraine Little  . Number of Children: 3  . Years of Education: college   Occupational History  . retired      Pharmacist, hospital , [place] History Main Topics  . Smoking status: Never Smoker   . Smokeless tobacco: Never Used  . Alcohol Use: 0.6 oz/week    1 Glasses of wine per week     Comment: rare  . Drug Use: No  . Sexual Activity: Not Currently   Other Topics Concern  . Not on file   Social History Narrative   Pt lives in [place] with spouse.  Lorraine Little)  She was  previously a Education officer, museum.    Caffeine- None   Right handed   Patient has three children.   Patient has a college education.    Family History  Problem Relation Age of Onset  . Stroke Mother     Deceased @ 95  . Heart failure Mother     died from  . Liver cancer Father     Deceased @ 31  . Colon cancer Paternal Grandmother  ROS- All systems are reviewed and negative except as per the HPI above  Physical Exam: Filed Vitals:   02/18/16 1353  BP: 122/60  Pulse: 82  Height: 5\' 2"  (1.575 m)  Weight: 120 lb 9.6 oz (54.704 kg)    GEN- The patient is well appearing, alert and oriented x 3 today.   Head- normocephalic, atraumatic Eyes-  Sclera clear, conjunctiva pink Ears- hearing intact Oropharynx- clear Neck- supple, no JVP Lymph- no cervical lymphadenopathy Lungs- Clear to ausculation bilaterally, normal work of breathing Heart- Regular rate and rhythm, no murmurs, rubs or gallops, PMI not laterally displaced GI- soft, NT, ND, + BS Extremities- no clubbing, cyanosis, or edema MS- no significant deformity or atrophy Skin- no rash or lesion Psych- euthymic mood, full affect Neuro- strength and sensation are intact  EKG- NSR, normal EKG, pr int 202 ms, qrs int 70 ms, qtc 427 ms Epic records reviewed  Assessment and Plan: 1. Afib s/p ablation Doing well maintaining SR Continue for now amiodarone Continue metoprolol as needed for elevated heart rate Continue xarelto without missed doses  2. HTN Well managed  F/u with Dr. Rayann Heman as scheduled 6/21 afib clinic as needed  Butch Penny C. Carroll, [facility] [address] [place], [place] [address] [phone]

## 2016-04-06 ENCOUNTER — Other Ambulatory Visit: Payer: Self-pay | Admitting: Neurology

## 2016-04-06 NOTE — Telephone Encounter (Signed)
RX for sonata faxed to pt's [facility] pharmacy. Received a receipt of confirmation.

## 2016-04-12 ENCOUNTER — Other Ambulatory Visit: Payer: Self-pay | Admitting: Internal Medicine

## 2016-04-20 ENCOUNTER — Encounter: Payer: Self-pay | Admitting: Internal Medicine

## 2016-04-20 ENCOUNTER — Ambulatory Visit (INDEPENDENT_AMBULATORY_CARE_PROVIDER_SITE_OTHER): Payer: PPO | Admitting: Internal Medicine

## 2016-04-20 VITALS — BP 126/62 | HR 81 | Ht 62.0 in | Wt 121.4 lb

## 2016-04-20 DIAGNOSIS — I480 Paroxysmal atrial fibrillation: Secondary | ICD-10-CM

## 2016-04-20 DIAGNOSIS — G4733 Obstructive sleep apnea (adult) (pediatric): Secondary | ICD-10-CM | POA: Diagnosis not present

## 2016-04-20 MED ORDER — AMIODARONE HCL 200 MG PO TABS: 100.0000 mg | ORAL_TABLET | Freq: Every day | ORAL | Status: DC

## 2016-04-20 NOTE — Progress Notes (Signed)
Electrophysiology Office Note   Date:  04/20/2016   ID:  DELEAH RAWLINGS, DOB [birth year], MRN [id number]  PCP:  Jerlyn Ly, MD   Primary Electrophysiologist: Dr Caryl Comes  Chief Complaint  Patient presents with  . Atrial Fibrillation     History of Present Illness: Lorraine Little is a 75 y.o. female who presents today for electrophysiology follow-up.  Since her recent afib ablation she has done well. She denies procedure related complications and is pleased with results.  Today, she denies symptoms of  chest pain, shortness of breath, orthopnea, PND, lower extremity edema, claudication, dizziness, presyncope, syncope, bleeding, or neurologic sequela. The patient is tolerating medications without difficulties and is otherwise without complaint today.    Past Medical History  Diagnosis Date  . Polymyalgia rheumatica ([place])     a. On chronic steroids  . Hypertension   . GERD (gastroesophageal reflux disease)     pt denies symptoms with this  . Migraine headache   . Cervical disc disease   . Osteoporosis   . Glaucoma   . Osteoarthritis   . Persistent atrial fibrillation ([place])     a.  Diagnosed 07/2011;  b. Normal nuclear myoview 07/2011 and normal EF at that time;  c. Most recent echo 2/20 /13 - EF 55-60% wit mild-mod MR.;  d. s/p DCCV 01/12/2012  . Hyperlipidemia     a. Diagnosed 12/2011  . CVA (cerebral infarction)     a. 11/2011  . Diverticulosis   . Internal hemorrhoid   . Tubular adenoma of colon 04/2007  . Adrenal insufficiency (HCC)     takes prednisone 5mg  daily  . Depression   . Insomnia with sleep apnea    Past Surgical History  Procedure Laterality Date  . Breast lumpectomy  1980's    right  x2 (benign)  . Tonsillectomy  age 52  . Cardioversion  01/12/2012    Procedure: CARDIOVERSION;  Surgeon: Lelon Perla, MD;  Location: [facility];  Service: Cardiovascular;  Laterality: N/A;  . Cardioversion  03/28/2012    Procedure: CARDIOVERSION;  Surgeon: Larey Dresser,  MD;  Location: [facility];  Service: Cardiovascular;  Laterality: N/A;  . Cardioversion  06/29/2012    Procedure: CARDIOVERSION;  Surgeon: Darlin Coco, MD;  Location: [facility] ENDOSCOPY;  Service: Cardiovascular;  Laterality: N/A;  . Glaucoma surgery    . Cardioversion N/A 12/07/2015    Procedure: CARDIOVERSION;  Surgeon: Josue Hector, MD;  Location: [facility] ENDOSCOPY;  Service: Cardiovascular;  Laterality: N/A;  . Tee without cardioversion N/A 01/19/2016    Procedure: TRANSESOPHAGEAL ECHOCARDIOGRAM (TEE);  Surgeon: Thayer Headings, MD;  Location: [facility] ENDOSCOPY;  Service: Cardiovascular;  Laterality: N/A;  . Electrophysiologic study N/A 01/19/2016    AFib ablation by Dr Rayann Heman     Current Outpatient Prescriptions  Medication Sig Dispense Refill  . alendronate (FOSAMAX) 70 MG tablet Take 70 mg by mouth every 7 (seven) days. Take with a full glass of water on an empty stomach.    Marland Kitchen amiodarone (PACERONE) 200 MG tablet Take 1 tablet (200 mg total) by mouth daily. 60 tablet 6  . amLODipine (NORVASC) 5 MG tablet TAKE 1 TABLET (5 MG TOTAL) BY MOUTH DAILY. 30 tablet 6  . atorvastatin (LIPITOR) 80 MG tablet TAKE 1 TABLET BY MOUTH EVERY DAY 30 tablet 10  . Calcium-Vitamin D 600-200 MG-UNIT per tablet Take 1 tablet by mouth daily.     [name] Sodium (STOOL SOFTENER PLUS PO) Take 1 tablet by mouth  daily.     . Cholecalciferol (VITAMIN D-3) 5000 UNITS TABS Take 1 tablet by mouth daily. 1 tab daily    . Coenzyme Q10 (CO Q 10 PO) Take 1 tablet by mouth daily.    . ferrous sulfate 325 (65 FE) MG tablet Take 325 mg by mouth daily with breakfast.    . FIBER PO Take 1 tablet by mouth daily.    . fish oil-omega-3 fatty acids 1000 MG capsule Take 1 g by mouth daily.     Marland Kitchen gabapentin (NEURONTIN) 100 MG capsule TAKE ONE CAPSULE BY MOUTH IN THE MORNING ,1 TABLET MID AFTERNOON & 2 TABLETS AT BEDTIME 120 capsule 6  . metoprolol succinate (TOPROL-XL) 25 MG 24 hr tablet Take 25 mg by mouth daily as needed (heart  rate).    Marland Kitchen omeprazole (PRILOSEC) 20 MG capsule Take 1 capsule (20 mg total) by mouth daily. 90 capsule 3  . potassium chloride 20 MEQ TBCR Take 20 mEq by mouth daily. 35 tablet 1  . predniSONE (DELTASONE) 1 MG tablet Take 4 mg by mouth daily with breakfast.     . rivaroxaban (XARELTO) 20 MG TABS tablet Take 20 mg by mouth daily with supper.    . Sennosides (SENNA LAX PO) Take 2 tablets by mouth at bedtime as needed. For constipation    . valsartan-hydrochlorothiazide (DIOVAN-HCT) 320-25 MG per tablet Take 1 tablet by mouth daily.    . zaleplon (SONATA) 5 MG capsule TAKE 1 CAPSULE BY MOUTH AT BEDTIME AS NEEDED FOR SLEEP 30 capsule 1  . [DISCONTINUED] flecainide (TAMBOCOR) 100 MG tablet Take 100mg  1 tablet prn onset of atrial fib. 30 tablet 6   No current facility-administered medications for this visit.    Allergies:   Review of patient's allergies indicates no known allergies.   Social History:  The patient  reports that she has never smoked. She has never used smokeless tobacco. She reports that she drinks about 0.6 oz of alcohol per week. She reports that she does not use illicit drugs.   Family History:  The patient's  family history includes Colon cancer in her paternal grandmother; Heart failure in her mother; Liver cancer in her father; Stroke in her mother.    ROS:  Please see the history of present illness.   All other systems are reviewed and negative.    PHYSICAL EXAM: VS:  BP 126/62 mmHg  Pulse 81  Ht 5\' 2"  (1.575 m)  Wt 121 lb 6.4 oz (55.067 kg)  BMI 22.20 kg/m2 , BMI Body mass index is 22.2 kg/(m^2). GEN: Well nourished, well developed, in no acute distress HEENT: normal Neck: no JVD, carotid bruits, or masses Cardiac: iRRR; no murmurs, rubs, or gallops,no edema  Respiratory:  clear to auscultation bilaterally, normal work of breathing GI: soft, nontender, nondistended, + BS MS: no deformity or atrophy Skin: warm and dry  Neuro:  Strength and sensation are  intact Psych: euthymic mood, full affect  EKG:  EKG today reveals sinus rhythm 81 bpm, PR 206 msec, nonspecific St/T changes   Recent Labs: 08/04/2015: ALT 23; TSH 0.671 01/12/2016: Hemoglobin 13.6; Platelets 326 01/20/2016: BUN 9; Creatinine, Ser 0.91; Potassium 3.8; Sodium 134*    Lipid Panel     Component Value Date/Time   CHOL 232* 12/21/2011 0550   TRIG 60 12/21/2011 0550   HDL 87 12/21/2011 0550   CHOLHDL 2.7 12/21/2011 0550   VLDL 12 12/21/2011 0550   LDLCALC 133* 12/21/2011 0550     Wt Readings  from Last 3 Encounters:  04/20/16 121 lb 6.4 oz (55.067 kg)  02/18/16 120 lb 9.6 oz (54.704 kg)  01/19/16 130 lb 1.1 oz (59 kg)      Other studies Reviewed: Additional studies/ records that were reviewed today include: afib clinic notes, Dr Aquilla Hacker notes, echo 02/11/14  Review of the above records today demonstrates: EF 60%, mild to moderate MR, pa pressure 46 mm Hg, LA 38 mm   ASSESSMENT AND PLAN:  1.  Persistent atrial fibrillation Doing well s/p ablation She is a little reluctant to stop amiodarone Reduce amiodarone to 100mg  daily today If no further arrhythmias, she will stop amiodarone in 4 weeks Continue life long anticoagulation given prior stroke  2. OSA Compliance with therapy encouraged   3. htn Stable No change required today   Current medicines are reviewed at length with the patient today.   The patient does not have concerns regarding her medicines.  The following changes were made today:  none  Return to see me in 3 months If doing well at that time, will return care to Dr Caryl Comes and the AF clinic   Signed, Thompson Grayer, MD  04/20/2016 1:49 PM     [address] [place] [address] [phone] (office) [phone] (fax)

## 2016-04-20 NOTE — Patient Instructions (Signed)
Medication Instructions:  Your physician has recommended you make the following change in your medication:  1) Decrease Amiodarone to 100 mg daily for 4 weeks then stop   Labwork: None ordered   Testing/Procedures: None ordered   Follow-Up: Your physician recommends that you schedule a follow-up appointment in: 3 months with Dr Allred   Any Other Special Instructions Will Be Listed Below (If Applicable).     If you need a refill on your cardiac medications before your next appointment, please call your pharmacy.   

## 2016-05-19 DIAGNOSIS — H5711 Ocular pain, right eye: Secondary | ICD-10-CM | POA: Diagnosis not present

## 2016-05-19 DIAGNOSIS — H2513 Age-related nuclear cataract, bilateral: Secondary | ICD-10-CM | POA: Diagnosis not present

## 2016-05-19 DIAGNOSIS — H40003 Preglaucoma, unspecified, bilateral: Secondary | ICD-10-CM | POA: Diagnosis not present

## 2016-05-19 DIAGNOSIS — H40033 Anatomical narrow angle, bilateral: Secondary | ICD-10-CM | POA: Diagnosis not present

## 2016-05-28 ENCOUNTER — Other Ambulatory Visit: Payer: Self-pay | Admitting: Internal Medicine

## 2016-06-22 ENCOUNTER — Telehealth (HOSPITAL_COMMUNITY): Payer: Self-pay | Admitting: *Deleted

## 2016-06-22 NOTE — Telephone Encounter (Signed)
Pt called reporting that her blood pressures have been dropping low over the last several days.  Sunday she experienced dizziness and nausea with a bp of 81/44 and the highest shes had has been a systolic of 99991111 at bedtime.  She was wanting to know if she should discontinue one of her bp meds.  Pt will f/u with [place] tomorrow and discontinue the amlodipine for now per Roderic Palau, NP.  Pt understood instructions and will f/u on Thurs 08/24

## 2016-06-23 ENCOUNTER — Encounter (HOSPITAL_COMMUNITY): Payer: Self-pay | Admitting: Nurse Practitioner

## 2016-06-23 ENCOUNTER — Ambulatory Visit (HOSPITAL_COMMUNITY)
Admission: RE | Admit: 2016-06-23 | Discharge: 2016-06-23 | Disposition: A | Discharge status: Home / Self Care | Payer: PPO | Source: Ambulatory Visit | Attending: Nurse Practitioner | Admitting: Nurse Practitioner

## 2016-06-23 VITALS — BP 110/62 | Ht 62.0 in | Wt 116.4 lb

## 2016-06-23 DIAGNOSIS — M353 Polymyalgia rheumatica: Secondary | ICD-10-CM | POA: Diagnosis not present

## 2016-06-23 DIAGNOSIS — Z8673 Personal history of transient ischemic attack (TIA), and cerebral infarction without residual deficits: Secondary | ICD-10-CM | POA: Diagnosis not present

## 2016-06-23 DIAGNOSIS — M1990 Unspecified osteoarthritis, unspecified site: Secondary | ICD-10-CM | POA: Diagnosis not present

## 2016-06-23 DIAGNOSIS — E2740 Unspecified adrenocortical insufficiency: Secondary | ICD-10-CM | POA: Diagnosis not present

## 2016-06-23 DIAGNOSIS — M810 Age-related osteoporosis without current pathological fracture: Secondary | ICD-10-CM | POA: Insufficient documentation

## 2016-06-23 DIAGNOSIS — Z7952 Long term (current) use of systemic steroids: Secondary | ICD-10-CM | POA: Insufficient documentation

## 2016-06-23 DIAGNOSIS — I4891 Unspecified atrial fibrillation: Secondary | ICD-10-CM | POA: Diagnosis not present

## 2016-06-23 DIAGNOSIS — I10 Essential (primary) hypertension: Secondary | ICD-10-CM | POA: Insufficient documentation

## 2016-06-23 DIAGNOSIS — I959 Hypotension, unspecified: Secondary | ICD-10-CM | POA: Diagnosis not present

## 2016-06-23 DIAGNOSIS — E785 Hyperlipidemia, unspecified: Secondary | ICD-10-CM | POA: Diagnosis not present

## 2016-06-23 DIAGNOSIS — Z823 Family history of stroke: Secondary | ICD-10-CM | POA: Diagnosis not present

## 2016-06-23 DIAGNOSIS — Z7983 Long term (current) use of bisphosphonates: Secondary | ICD-10-CM | POA: Diagnosis not present

## 2016-06-23 DIAGNOSIS — Z7901 Long term (current) use of anticoagulants: Secondary | ICD-10-CM | POA: Insufficient documentation

## 2016-06-23 DIAGNOSIS — Z8249 Family history of ischemic heart disease and other diseases of the circulatory system: Secondary | ICD-10-CM | POA: Diagnosis not present

## 2016-06-23 DIAGNOSIS — Z79899 Other long term (current) drug therapy: Secondary | ICD-10-CM | POA: Diagnosis not present

## 2016-06-23 LAB — CBC
HEMATOCRIT: 37.6 % (ref 36.0–46.0)
HEMOGLOBIN: 12.8 g/dL (ref 12.0–15.0)
MCH: 31.1 pg (ref 26.0–34.0)
MCHC: 34 g/dL (ref 30.0–36.0)
MCV: 91.5 fL (ref 78.0–100.0)
Platelets: 236 10*3/uL (ref 150–400)
RBC: 4.11 MIL/uL (ref 3.87–5.11)
RDW: 13.9 % (ref 11.5–15.5)
WBC: 5.5 10*3/uL (ref 4.0–10.5)

## 2016-06-23 LAB — BASIC METABOLIC PANEL WITH GFR
Anion gap: 9 (ref 5–15)
BUN: 17 mg/dL (ref 6–20)
CO2: 30 mmol/L (ref 22–32)
Calcium: 9.2 mg/dL (ref 8.9–10.3)
Chloride: 92 mmol/L — ABNORMAL LOW (ref 101–111)
Creatinine, Ser: 1.11 mg/dL — ABNORMAL HIGH (ref 0.44–1.00)
GFR calc Af Amer: 54 mL/min — ABNORMAL LOW
GFR calc non Af Amer: 47 mL/min — ABNORMAL LOW
Glucose, Bld: 104 mg/dL — ABNORMAL HIGH (ref 65–99)
Potassium: 3 mmol/L — ABNORMAL LOW (ref 3.5–5.1)
Sodium: 131 mmol/L — ABNORMAL LOW (ref 135–145)

## 2016-06-23 LAB — TSH: TSH: 0.302 u[IU]/mL — AB (ref 0.350–4.500)

## 2016-06-23 NOTE — Addendum Note (Signed)
Encounter addended by: Sherran Needs, NP on: 06/23/2016  3:19 PM<BR>    Actions taken: Sign clinical note

## 2016-06-23 NOTE — Patient Instructions (Addendum)
Please make sure you are drinking plenty of fluids  Continue to hold your amlodipine IF BP continues to drop or be low over the weekend you may decrease your valsartan-hctz to half a dose.     You will have labs done today.  If anything is Abnormal we will call you  Your physician recommends that you schedule a follow-up appointment in: 2 weeks This has been scheduled for 07/07/2016 at 11:30

## 2016-06-23 NOTE — Addendum Note (Signed)
Encounter addended by: Sherran Needs, NP on: 06/23/2016 12:06 PM<BR>    Actions taken: LOS modified, Problem List reviewed

## 2016-06-23 NOTE — Progress Notes (Addendum)
Primary Care Physician: Jerlyn Ly, MD Referring Physician: Dr. Waverly Ferrari Lorraine Little is a 75 y.o. female with a h/o aifb ablation 01/19/16 and has done well without afib since procedure. She called the office first of week c/o that her BP was running lower than usual with BP in the 99991111 systolic in the am but would normalize toward the pm.. She was advised to stop amlodipine and to come in today. This is the second morning she did not take amlodipine. BP this am was in the 80's again. Now at [id number] systolic. She drinks a couple of glasses of tea each day but no water. No change in health or meds. Denies any diarrhea or excessively being out I the heat.  Today, she denies symptoms of palpitations, chest pain, shortness of breath, orthopnea, PND, lower extremity edema, dizziness, presyncope, syncope, or neurologic sequela. The patient is tolerating medications without difficulties and is otherwise without complaint today.   Past Medical History:  Diagnosis Date  . Adrenal insufficiency (HCC)    takes prednisone 5mg  daily  . Cervical disc disease   . CVA (cerebral infarction)    a. 11/2011  . Depression   . Diverticulosis   . GERD (gastroesophageal reflux disease)    pt denies symptoms with this  . Glaucoma   . Hyperlipidemia    a. Diagnosed 12/2011  . Hypertension   . Insomnia with sleep apnea   . Internal hemorrhoid   . Migraine headache   . Osteoarthritis   . Osteoporosis   . Persistent atrial fibrillation ([place])    a.  Diagnosed 07/2011;  b. Normal nuclear myoview 07/2011 and normal EF at that time;  c. Most recent echo 2/20 /13 - EF 55-60% wit mild-mod MR.;  d. s/p DCCV 01/12/2012  . Polymyalgia rheumatica ([place])    a. On chronic steroids  . Tubular adenoma of colon 04/2007   Past Surgical History:  Procedure Laterality Date  . BREAST LUMPECTOMY  1980's   right  x2 (benign)  . CARDIOVERSION  01/12/2012   Procedure: CARDIOVERSION;  Surgeon: Lelon Perla, MD;  Location:  [place];  Service: Cardiovascular;  Laterality: N/A;  . CARDIOVERSION  03/28/2012   Procedure: CARDIOVERSION;  Surgeon: Larey Dresser, MD;  Location: [facility];  Service: Cardiovascular;  Laterality: N/A;  . CARDIOVERSION  06/29/2012   Procedure: CARDIOVERSION;  Surgeon: Darlin Coco, MD;  Location: [facility] ENDOSCOPY;  Service: Cardiovascular;  Laterality: N/A;  . CARDIOVERSION N/A 12/07/2015   Procedure: CARDIOVERSION;  Surgeon: Josue Hector, MD;  Location: [facility] ENDOSCOPY;  Service: Cardiovascular;  Laterality: N/A;  . ELECTROPHYSIOLOGIC STUDY N/A 01/19/2016   AFib ablation by Dr Rayann Heman  . GLAUCOMA SURGERY    . TEE WITHOUT CARDIOVERSION N/A 01/19/2016   Procedure: TRANSESOPHAGEAL ECHOCARDIOGRAM (TEE);  Surgeon: Thayer Headings, MD;  Location: [facility];  Service: Cardiovascular;  Laterality: N/A;  . TONSILLECTOMY  age 82    Current Outpatient Prescriptions  Medication Sig Dispense Refill  . alendronate (FOSAMAX) 70 MG tablet Take 70 mg by mouth every 7 (seven) days. Take with a full glass of water on an empty stomach.    Marland Kitchen atorvastatin (LIPITOR) 80 MG tablet TAKE 1 TABLET BY MOUTH EVERY DAY 30 tablet 10  . Calcium-Vitamin D 600-200 MG-UNIT per tablet Take 1 tablet by mouth daily.     . Cholecalciferol (VITAMIN D-3) 5000 UNITS TABS Take 1 tablet by mouth daily. 1 tab daily    . Coenzyme Q10 (  CO Q 10 PO) Take 1 tablet by mouth daily.    . ferrous sulfate 325 (65 FE) MG tablet Take 325 mg by mouth daily with breakfast.    . FIBER PO Take 1 tablet by mouth daily.    Marland Kitchen gabapentin (NEURONTIN) 100 MG capsule TAKE ONE CAPSULE BY MOUTH IN THE MORNING ,1 TABLET MID AFTERNOON & 2 TABLETS AT BEDTIME 120 capsule 6  . metoprolol succinate (TOPROL-XL) 25 MG 24 hr tablet Take 25 mg by mouth daily as needed (heart rate).    Marland Kitchen omeprazole (PRILOSEC) 20 MG capsule Take 1 capsule (20 mg total) by mouth daily. 90 capsule 3  . predniSONE (DELTASONE) 1 MG tablet Take 3 mg by mouth daily with breakfast.     . rivaroxaban  (XARELTO) 20 MG TABS tablet Take 20 mg by mouth daily with supper.    . valsartan-hydrochlorothiazide (DIOVAN-HCT) 320-25 MG per tablet Take 1 tablet by mouth daily.    Marland Kitchen amLODipine (NORVASC) 5 MG tablet TAKE 1 TABLET (5 MG TOTAL) BY MOUTH DAILY. (Patient not taking: Reported on 06/23/2016) 30 tablet 10  . fish oil-omega-3 fatty acids 1000 MG capsule Take 1 g by mouth daily.      No current facility-administered medications for this encounter.     No Known Allergies  Social History   Social History  . Marital status: Married    Spouse name: [name]  . Number of children: 3  . Years of education: college   Occupational History  . retired      Pharmacist, hospital , [place] History Main Topics  . Smoking status: Never Smoker  . Smokeless tobacco: Never Used  . Alcohol use 0.6 oz/week    1 Glasses of wine per week     Comment: rare  . Drug use: No  . Sexual activity: Not Currently   Other Topics Concern  . Not on file   Social History Narrative   Pt lives in [place] with spouse.  [name])  She was previously a Education officer, museum.    Caffeine- None   Right handed   Patient has three children.   Patient has a college education.    Family History  Problem Relation Age of Onset  . Stroke Mother     Deceased @ 59  . Heart failure Mother     died from  . Liver cancer Father     Deceased @ 23  . Colon cancer Paternal Grandmother     ROS- All systems are reviewed and negative except as per the HPI above  Physical Exam: Vitals:   06/23/16 1044  BP: 110/62  Weight: 116 lb 6.4 oz (52.8 kg)  Height: 5\' 2"  (1.575 m)    GEN- The patient is well appearing, alert and oriented x 3 today.   Head- normocephalic, atraumatic Eyes-  Sclera clear, conjunctiva pink Ears- hearing intact Oropharynx- clear Neck- supple, no JVP Lymph- no cervical lymphadenopathy Lungs- Clear to ausculation bilaterally, normal work of breathing Heart- Regular rate and rhythm, no murmurs, rubs or gallops,  PMI not laterally displaced GI- soft, NT, ND, + BS Extremities- no clubbing, cyanosis, or edema MS- no significant deformity or atrophy Skin- no rash or lesion Psych- euthymic mood, full affect Neuro- strength and sensation are intact  EKG- NSR at 81 bpm  Assessment and Plan: 1. Hypotension Continue to hold amlodipine Drink 2-3 glasses of water during the day If continues to run low with cut valsartan/hctz in half Bmet, cbc, tsh today  F/u in two weeks   Geroge Baseman. Mila [facility] [address] [place], [place] [address] [phone]  Addendum: 8/24 at 3:15: Contacted pt after labs were reviewed. TSH is slighly low and will need f/u with PCP for full panel. Creat up, Na/Cl, K+ all low and I feel that she is on too much HCTZ. She feels that she needs hctz for ankle edema. I plan to call in valsartan 325 mg plain to take daily, break out the HCTZ 25 mg and she can take M-W-F. She has Klor Con 10 meq at home and will take two tonight and then one a day. She will need f/u bmet drawn as well. She can watch BP trends at home with change in meds.

## 2016-06-24 ENCOUNTER — Ambulatory Visit (HOSPITAL_COMMUNITY): Payer: PPO | Admitting: Nurse Practitioner

## 2016-06-24 ENCOUNTER — Other Ambulatory Visit (HOSPITAL_COMMUNITY): Payer: Self-pay | Admitting: *Deleted

## 2016-06-24 MED ORDER — POTASSIUM CHLORIDE ER 10 MEQ PO TBCR: EXTENDED_RELEASE_TABLET | ORAL | 3 refills | Status: DC

## 2016-06-24 MED ORDER — HYDROCHLOROTHIAZIDE 25 MG PO TABS: 25.0000 mg | ORAL_TABLET | ORAL | 3 refills | Status: DC

## 2016-06-24 MED ORDER — VALSARTAN 320 MG PO TABS: 320.0000 mg | ORAL_TABLET | Freq: Every day | ORAL | 1 refills | Status: DC

## 2016-06-29 DIAGNOSIS — R8299 Other abnormal findings in urine: Secondary | ICD-10-CM | POA: Diagnosis not present

## 2016-06-29 DIAGNOSIS — I480 Paroxysmal atrial fibrillation: Secondary | ICD-10-CM | POA: Diagnosis not present

## 2016-06-29 DIAGNOSIS — Z6820 Body mass index (BMI) 20.0-20.9, adult: Secondary | ICD-10-CM | POA: Diagnosis not present

## 2016-06-29 DIAGNOSIS — R3915 Urgency of urination: Secondary | ICD-10-CM | POA: Diagnosis not present

## 2016-06-29 DIAGNOSIS — I10 Essential (primary) hypertension: Secondary | ICD-10-CM | POA: Diagnosis not present

## 2016-06-29 DIAGNOSIS — Z23 Encounter for immunization: Secondary | ICD-10-CM | POA: Diagnosis not present

## 2016-06-29 DIAGNOSIS — M353 Polymyalgia rheumatica: Secondary | ICD-10-CM | POA: Diagnosis not present

## 2016-06-29 DIAGNOSIS — R946 Abnormal results of thyroid function studies: Secondary | ICD-10-CM | POA: Diagnosis not present

## 2016-06-29 DIAGNOSIS — N390 Urinary tract infection, site not specified: Secondary | ICD-10-CM | POA: Diagnosis not present

## 2016-07-07 ENCOUNTER — Encounter (HOSPITAL_COMMUNITY): Payer: Self-pay | Admitting: Nurse Practitioner

## 2016-07-07 ENCOUNTER — Ambulatory Visit (HOSPITAL_COMMUNITY)
Admission: RE | Admit: 2016-07-07 | Discharge: 2016-07-07 | Disposition: A | Discharge status: Home / Self Care | Payer: PPO | Source: Ambulatory Visit | Attending: Nurse Practitioner | Admitting: Nurse Practitioner

## 2016-07-07 VITALS — BP 138/70 | HR 83 | Ht 62.0 in | Wt 119.2 lb

## 2016-07-07 DIAGNOSIS — I481 Persistent atrial fibrillation: Secondary | ICD-10-CM | POA: Insufficient documentation

## 2016-07-07 DIAGNOSIS — K219 Gastro-esophageal reflux disease without esophagitis: Secondary | ICD-10-CM | POA: Insufficient documentation

## 2016-07-07 DIAGNOSIS — Z7952 Long term (current) use of systemic steroids: Secondary | ICD-10-CM | POA: Insufficient documentation

## 2016-07-07 DIAGNOSIS — E2740 Unspecified adrenocortical insufficiency: Secondary | ICD-10-CM | POA: Diagnosis not present

## 2016-07-07 DIAGNOSIS — I10 Essential (primary) hypertension: Secondary | ICD-10-CM | POA: Diagnosis not present

## 2016-07-07 DIAGNOSIS — I952 Hypotension due to drugs: Secondary | ICD-10-CM | POA: Diagnosis not present

## 2016-07-07 DIAGNOSIS — R946 Abnormal results of thyroid function studies: Secondary | ICD-10-CM | POA: Diagnosis not present

## 2016-07-07 DIAGNOSIS — Z8673 Personal history of transient ischemic attack (TIA), and cerebral infarction without residual deficits: Secondary | ICD-10-CM | POA: Insufficient documentation

## 2016-07-07 DIAGNOSIS — E785 Hyperlipidemia, unspecified: Secondary | ICD-10-CM | POA: Insufficient documentation

## 2016-07-07 DIAGNOSIS — F329 Major depressive disorder, single episode, unspecified: Secondary | ICD-10-CM | POA: Insufficient documentation

## 2016-07-07 DIAGNOSIS — Z7901 Long term (current) use of anticoagulants: Secondary | ICD-10-CM | POA: Diagnosis not present

## 2016-07-07 DIAGNOSIS — I480 Paroxysmal atrial fibrillation: Secondary | ICD-10-CM | POA: Diagnosis not present

## 2016-07-07 DIAGNOSIS — M353 Polymyalgia rheumatica: Secondary | ICD-10-CM | POA: Diagnosis not present

## 2016-07-07 DIAGNOSIS — G4730 Sleep apnea, unspecified: Secondary | ICD-10-CM | POA: Insufficient documentation

## 2016-07-07 NOTE — Progress Notes (Signed)
Primary Care Physician: Jerlyn Ly, MD Referring Physician: Dr. Caryl Comes EP: Dr. Juanetta Snow Lorraine Little is a 75 y.o. female with a h/o aifb ablation 01/19/16 and has done well without afib since procedure. She called the office 8/24 c/o that her BP was running lower than usual with BP in the [id number] systolic in the am but would normalize toward the pm. She was advised to stop amlodipine and to come in today. This is the second morning she did not take amlodipine. BP was in the 80's again. Now at [id number] systolic. She drinks a couple of glasses of tea each day but no water. No change in health or meds. Denies any diarrhea or excessively being out in  the heat.  Labs were drawn which showed  low K+, na and cl. K+ was replaced and her combo BP was stopped and she continued   on valsartan 320 mg daily. HCTZ was changed to 3x a week. Amlodipine on hold. TSH was checked and showed mildly abnormal result at 0.302.   She returns today, 9/8 and is feeling better. No further low BP readings. She did f/u with her PCP and bmet was improved and K+ replacement was changed to 3x a week. PCP felt that thyroid was reflection of being on amiodarone which pt stopped in July, and he will f/u with a thyroid panel in 6 weeks. No issues with afib.  Today, she denies symptoms of palpitations, chest pain, shortness of breath, orthopnea, PND, lower extremity edema, dizziness, presyncope, syncope, or neurologic sequela. The patient is tolerating medications without difficulties and is otherwise without complaint today.   Past Medical History:  Diagnosis Date  . Adrenal insufficiency (HCC)    takes prednisone 5mg  daily  . Cervical disc disease   . CVA (cerebral infarction)    a. 11/2011  . Depression   . Diverticulosis   . GERD (gastroesophageal reflux disease)    pt denies symptoms with this  . Glaucoma   . Hyperlipidemia    a. Diagnosed 12/2011  . Hypertension   . Insomnia with sleep apnea   . Internal  hemorrhoid   . Migraine headache   . Osteoarthritis   . Osteoporosis   . Persistent atrial fibrillation ([place])    a.  Diagnosed 07/2011;  b. Normal nuclear myoview 07/2011 and normal EF at that time;  c. Most recent echo 2/20 /13 - EF 55-60% wit mild-mod MR.;  d. s/p DCCV 01/12/2012  . Polymyalgia rheumatica ([place])    a. On chronic steroids  . Tubular adenoma of colon 04/2007   Past Surgical History:  Procedure Laterality Date  . BREAST LUMPECTOMY  1980's   right  x2 (benign)  . CARDIOVERSION  01/12/2012   Procedure: CARDIOVERSION;  Surgeon: Lelon Perla, MD;  Location: [place];  Service: Cardiovascular;  Laterality: N/A;  . CARDIOVERSION  03/28/2012   Procedure: CARDIOVERSION;  Surgeon: Larey Dresser, MD;  Location: [place];  Service: Cardiovascular;  Laterality: N/A;  . CARDIOVERSION  06/29/2012   Procedure: CARDIOVERSION;  Surgeon: Darlin Coco, MD;  Location[facility]COPY;  Service: Cardiovascular;  Laterality: N/A;  . CARDIOVERSION N/A 12/07/2015   Procedure: CARDIOVERSION;  Surgeon: Josue Hector, MD;  Location: [facility] ENDOSCOPY;  Service: Cardiovascular;  Laterality: N/A;  . ELECTROPHYSIOLOGIC STUDY N/A 01/19/2016   AFib ablation by Dr Rayann Heman  . GLAUCOMA SURGERY    . TEE WITHOUT CARDIOVERSION N/A 01/19/2016   Procedure: TRANSESOPHAGEAL ECHOCARDIOGRAM (TEE);  Surgeon: Arnette Norris  Deboraha Sprang, MD;  Location: [place];  Service: Cardiovascular;  Laterality: N/A;  . TONSILLECTOMY  age 43    Current Outpatient Prescriptions  Medication Sig Dispense Refill  . alendronate (FOSAMAX) 70 MG tablet Take 70 mg by mouth every 7 (seven) days. Take with a full glass of water on an empty stomach.    Marland Kitchen amLODipine (NORVASC) 5 MG tablet TAKE 1 TABLET (5 MG TOTAL) BY MOUTH DAILY. 30 tablet 10  . atorvastatin (LIPITOR) 80 MG tablet TAKE 1 TABLET BY MOUTH EVERY DAY 30 tablet 10  . Calcium-Vitamin D 600-200 MG-UNIT per tablet Take 1 tablet by mouth daily.     . Cholecalciferol (VITAMIN D-3) 5000 UNITS TABS Take  1 tablet by mouth daily. 1 tab daily    . Coenzyme Q10 (CO Q 10 PO) Take 1 tablet by mouth daily.    . ferrous sulfate 325 (65 FE) MG tablet Take 325 mg by mouth daily with breakfast.    . FIBER PO Take 1 tablet by mouth daily.    Marland Kitchen gabapentin (NEURONTIN) 100 MG capsule TAKE ONE CAPSULE BY MOUTH IN THE MORNING ,1 TABLET MID AFTERNOON & 2 TABLETS AT BEDTIME 120 capsule 6  . hydrochlorothiazide (HYDRODIURIL) 25 MG tablet Take 1 tablet (25 mg total) by mouth 3 (three) times a week. 36 tablet 3  . metoprolol succinate (TOPROL-XL) 25 MG 24 hr tablet Take 25 mg by mouth daily as needed (heart rate).    Marland Kitchen omeprazole (PRILOSEC) 20 MG capsule Take 1 capsule (20 mg total) by mouth daily. 90 capsule 3  . potassium chloride (K-DUR,KLOR-CON) 10 MEQ tablet Take 10 mEq by mouth every other day.    . predniSONE (DELTASONE) 1 MG tablet Take 3 mg by mouth daily with breakfast.     . rivaroxaban (XARELTO) 20 MG TABS tablet Take 20 mg by mouth daily with supper.    . valsartan (DIOVAN) 320 MG tablet Take 1 tablet (320 mg total) by mouth daily. 90 tablet 1  . fish oil-omega-3 fatty acids 1000 MG capsule Take 1 g by mouth daily.      No current facility-administered medications for this encounter.     No Known Allergies  Social History   Social History  . Marital status: Married    Spouse name: [name]  . Number of children: 3  . Years of education: college   Occupational History  . retired      Pharmacist, hospital , [place] History Main Topics  . Smoking status: Never Smoker  . Smokeless tobacco: Never Used  . Alcohol use 0.6 oz/week    1 Glasses of wine per week     Comment: rare  . Drug use: No  . Sexual activity: Not Currently   Other Topics Concern  . Not on file   Social History Narrative   Pt lives in [place] with spouse.  [name])  She was previously a Education officer, museum.    Caffeine- None   Right handed   Patient has three children.   Patient has a college education.    Family History    Problem Relation Age of Onset  . Stroke Mother     Deceased @ 40  . Heart failure Mother     died from  . Liver cancer Father     Deceased @ 37  . Colon cancer Paternal Grandmother     ROS- All systems are reviewed and negative except as per the HPI above  Physical Exam: Vitals:  07/07/16 1115  BP: 138/70  Pulse: 83  Weight: 119 lb 3.2 oz (54.1 kg)  Height: 5\' 2"  (1.575 m)    GEN- The patient is well appearing, alert and oriented x 3 today.   Head- normocephalic, atraumatic Eyes-  Sclera clear, conjunctiva pink Ears- hearing intact Oropharynx- clear Neck- supple, no JVP Lymph- no cervical lymphadenopathy Lungs- Clear to ausculation bilaterally, normal work of breathing Heart- Regular rate and rhythm, no murmurs, rubs or gallops, PMI not laterally displaced GI- soft, NT, ND, + BS Extremities- no clubbing, cyanosis, or edema MS- no significant deformity or atrophy Skin- no rash or lesion Psych- euthymic mood, full affect Neuro- strength and sensation are intact  EKG- NSR at 83 bpm, pr int 174 ms, qrs int 68 ms, qtc 444 ms   Assessment and Plan: 1. Hypertension with recent hypotension Improved with adjustment of BP meds K+ replaced and rechecked by PCP and pt states back to normal range  2. Afib In SR s/p ablation in March Off amiodarone  Continue metoprolol Continue xarelto with chadsvasc score of at least 4  3. Abnormal TSH PCP aware and will recheck in 6 weeks, thinks lingering effect of amiodarone  F/u  with Dr. Rayann Heman as scheduled 9/25 afib clinic as needed   Butch Penny C. Sierah Lacewell, [facility] [address] [place], [place] [address] [phone]

## 2016-07-18 ENCOUNTER — Telehealth (HOSPITAL_COMMUNITY): Payer: Self-pay | Admitting: *Deleted

## 2016-07-18 NOTE — Telephone Encounter (Signed)
Patient called in stating at 3am she woke up with her heart pounding to where she could feel it in her back and she could not lay down. She had never experienced her heart beating like that. Pt did take 25mg  of metoprolol and the symptoms subsided after 30 mins. Discussed with Roderic Palau NP and pt will stay on 1/2 tablet of metoprolol (12.5mg ) daily until she sees Dr. Rayann Heman in a few weeks. BP and fluid status are stable with recent medication changes to HCTZ. Pt will call if further issues.

## 2016-07-19 ENCOUNTER — Encounter: Payer: Self-pay | Admitting: Neurology

## 2016-07-19 ENCOUNTER — Ambulatory Visit (INDEPENDENT_AMBULATORY_CARE_PROVIDER_SITE_OTHER): Payer: PPO | Admitting: Neurology

## 2016-07-19 VITALS — BP 130/62 | HR 84 | Resp 20 | Ht 63.0 in | Wt 119.0 lb

## 2016-07-19 DIAGNOSIS — I480 Paroxysmal atrial fibrillation: Secondary | ICD-10-CM

## 2016-07-19 DIAGNOSIS — G4733 Obstructive sleep apnea (adult) (pediatric): Secondary | ICD-10-CM

## 2016-07-19 DIAGNOSIS — Z7901 Long term (current) use of anticoagulants: Secondary | ICD-10-CM

## 2016-07-19 DIAGNOSIS — Z5181 Encounter for therapeutic drug level monitoring: Secondary | ICD-10-CM | POA: Insufficient documentation

## 2016-07-19 DIAGNOSIS — Z9989 Dependence on other enabling machines and devices: Principal | ICD-10-CM

## 2016-07-19 NOTE — Progress Notes (Addendum)
[facility]  Provider:  Larey Seat, M D  Referring Provider: Crist Infante, MD Primary Care Physician:  Jerlyn Ly, MD  No chief complaint on file.   HPI:  Lorraine Little is a 75 y.o., caucasian, right handed female.  08-14-14 ; Patient [place] underwent a sleep study on 11-27-13. AHI was 33.1 and her RDI was 33.1/hr. as well. Not a mild case of apnea.The lowest oxygen level was at 78% , 02 saturation was 12.6 minutes total time below 89% saturation. She had irregular heart rate and normal sinus rhythm throughout the diagnostic study. There were no periodic limb movements noted. Based on the high AHI CPAP was initiated and the patient was only titrated to 5 cm with. She still had a very poor sleep efficiency but AHI was reduced to 0. She used a nasal pillow and an ESON  nasal mask.  Download was reviewed today. She brought me today at a download from her machine and it shows that she has used the machine 5 hours and 5 minutes nightly , a 97% compliance over 30 days.  Again,  her main problem now is not the residual apnea but her insomnia. She had trouble sleeping for a long time before OSA was diagnosed and CPAP initiated. The CPAP has helped her headaches! Her problem is sleep initiation, not staying asleep. She has less palpiations. Continues on amiodarone , which can induce insomnia . She is tolerating it well, but had some neuropathic changes.  Belsomra at 10 mg was felt too strong, to groggy making in AM.  I offered her 5 mg today. I would like for her to consider the ablation therapy form atrial fibrillation, and to be able to d/c the amiodarone treatment.  She will discuss this step with Dr. Virl Axe , her cardiologist and electrophysiologist this afternoon.   Revisit from 08-20-15, Lorraine Little is here today and reports that she has not used her CPAP for over 5 months. Her headaches however have not returned. She does not feel it had any impact on her sleep or  lack of sleep. She has been treated with gabapentin and she thinks this has successfully controlled her headaches. Her fatigue severity score today is 31 points and her Epworth sleepiness score is 4 points the geriatric depression score is endorsed at one point. Overall she feels well. She never found an comfortable interface without any breakout or rash, and the nasal pillow dislodged frequently. This actually interrupted her sleep and she felt that this she got less sleep on CPAP than without. She effectively discontinued CPAP use. Her cardiologist suggested to use CPAP for better HTN control. She will have to make a decision.   Rv 01-14-16  I agree with Dr Rayann Heman that atrial fibrillations are precipitated by risk factors such as caffeine or stimulant use,  OSA. Her apnea is moderate -severe. She struggles with Insomnia and felt CPAP was contributing or at least not helping. She reported air leaks form nasal interface , irritating her eyes. I will reinitiate CPAP at current pressure.  She endorsed the Epworth sleepiness score at 3 points fatigue severity at 32 points, geriatric depression at one point. Today's download shows an 83% compliance was 24 out of 30 days of over 4 hours of daily use. The patient used to machine 5 hours and 31 minutes a day , on average this is an Auto machine between 5 and 10 cm water pressure was 1 cm EPR, residual AHI is 0.6 and  excellent result the 95th percentile pressure is 10 cm water. I will ask her to have an appointment for a daytime PAP Nap. This should reveal the best fitting interface - perhaps a wisp?   She  is seen here as a revisit  from Dr. Joylene Draft for evaluation of sleep apnea with insomnia on CPAP . Dr. Leonie Man has seen this patient on  [facility].     07-19-2016 Lorraine Little an established patient in the [facility] and is presenting here today with a complaint of intermittent insomnia. She can not even sleep on vacation while [place].  This has been a problem for several years. Her primary care physician [name] has at times prescribed sleep aids,  but felt that it was reasonable to evaluate her sleep further before continuing to prescribe sedatives. The sleep test was positive and the patient was started on CPAP- after initially struggling, she is now compliant and used CPAP with great regularity. The mask took some getting used to. She remains hoarse - see Assessment and Plan. She has seen a specialist at [facility], has a problem at the voice box, a tremor.  Just  This Sunday night she reports she had severe palpitations and almost fell that her whole body was moving she couldn't keep still. This was an exception because most nights she is doing very well with the use of her CPAP and has actually some restful sleep. I was able to see today's compliance report dated 18th of September 2017 and she has 100% compliance for days 93 complaint for hours of use for 7 hours and 30 minutes average she is using an AutoSet between 5 and 10 with a residual AHI of 0.5 a pressure requirement at the 91st percentile is 10 cm water. No changes have to be made. She is not using any Ambien or Xanax or any sleep aid ! Good result.  She will see Dr Rayann Heman , follow up from ablation !     Review of Systems: Out of a complete 14 system review, the patient complains of only the following symptoms, and all other reviewed systems are negative. Snoring, dry mouth, palpitations, myalgia, INSOMNIA . Insomnia better controlled on melatonin.   She endorsed today the Epworth Sleepiness Scale at 6 points, the fatigue severity score of 31 points and the geriatric depression score at 2/ 15  points.  Social History   Social History  . Marital status: Married    Spouse name: [name]  . Number of children: 3  . Years of education: college   Occupational History  . retired      Pharmacist, hospital , [place] History Main Topics  . Smoking status: Never  Smoker  . Smokeless tobacco: Never Used  . Alcohol use 0.6 oz/week    1 Glasses of wine per week     Comment: rare  . Drug use: No  . Sexual activity: Not Currently   Other Topics Concern  . Not on file   Social History Narrative   Pt lives in [place] with spouse.  [name])  She was previously a Education officer, museum.    Caffeine- None   Right handed   Patient has three children.   Patient has a college education.    Family History  Problem Relation Age of Onset  . Stroke Mother     Deceased @ 18  . Heart failure Mother     died from  .  Liver cancer Father     Deceased @ 13  . Colon cancer Paternal Grandmother      Allergies as of 07/19/2016  . (No Known Allergies)    Vitals: There were no vitals taken for this visit. Last Weight:  Wt Readings from Last 1 Encounters:  07/07/16 119 lb 3.2 oz (54.1 kg)   Last Height:   Ht Readings from Last 1 Encounters:  07/07/16 5\' 2"  (1.575 m)    Physical exam:  General: The patient is awake, alert and appears not in acute distress. The patient is very well groomed. Head: Normocephalic, atraumatic. Neck is supple. Mallampati 2, neck circumference: 14 inches . No retrognathia.  Cardiovascular:  Regular rate and rhythm , without  murmurs or carotid bruit, and without distended neck veins. Respiratory: Lungs are clear to auscultation. Skin:  Without evidence of edema, Trunk: no scoliosis. Neurologic exam : The patient is awake and alert, oriented to place and time.  Memory subjective  described as intact.  There is a normal attention span & concentration ability. The patient is articulate and cooperative.  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  Mood and affect are anxious. Cranial nerves:Pupils are equal and briskly reactive to light. Facial motor strength is symmetric and tongue and uvula move midline.Motor exam:   Normal tone and normal muscle bulk and symmetric  normal strength in all extremities. Coordination: Rapid alternating movements in the fingers/hands is  normal.Gait and station: Patient walks without assistive device.Deep tendon reflexes: in the upper and lower extremities are symmetric and intact.    Assessment:  After physical and neurologic examination, review of laboratory studies, imaging, neurophysiology testing and pre-existing records,  25 minute assessment with more than 50% of the face to face time dedicated to discussion of atrial fib overlay with OSA, insomnia now not longer present. Patient will be send in office for new mask fitting with [facility], which will take another 15 minutes.    1) OSA risk factors,  Her non compliance in CPAP treatment  Is related to an  increased  risk of conditions such as CVA, atrial Fibrillation, HTN. She has finally committed to using CPAP and this treated her migraines and insomnia, too.     RV in 72 month   Tarrin Lebow, MD

## 2016-07-25 ENCOUNTER — Encounter: Payer: Self-pay | Admitting: Internal Medicine

## 2016-07-25 ENCOUNTER — Ambulatory Visit (INDEPENDENT_AMBULATORY_CARE_PROVIDER_SITE_OTHER): Payer: PPO | Admitting: Internal Medicine

## 2016-07-25 VITALS — BP 128/68 | HR 84 | Ht 62.5 in | Wt 120.0 lb

## 2016-07-25 DIAGNOSIS — I480 Paroxysmal atrial fibrillation: Secondary | ICD-10-CM

## 2016-07-25 DIAGNOSIS — I10 Essential (primary) hypertension: Secondary | ICD-10-CM

## 2016-07-25 NOTE — Progress Notes (Signed)
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  Since last being seen in our clinic, the patient reports doing very well.  She is off of Amiodarone and her AF is well controlled.  She has had 2 visits to the AF clinic for BP management.   Today, she denies symptoms of chest pain, shortness of breath,  lower extremity edema, dizziness, presyncope, or syncope.  The patient is otherwise without complaint today.   Past Medical History:  Diagnosis Date  . Adrenal insufficiency (HCC)    takes prednisone 5mg  daily  . Cervical disc disease   . CVA (cerebral infarction)    a. 11/2011  . Depression   . Diverticulosis   . GERD (gastroesophageal reflux disease)    pt denies symptoms with this  . Glaucoma   . Hyperlipidemia    a. Diagnosed 12/2011  . Hypertension   . Insomnia with sleep apnea   . Internal hemorrhoid   . Migraine headache   . Osteoarthritis   . Osteoporosis   . Persistent atrial fibrillation ([place])    a.  Diagnosed 07/2011;  b. Normal nuclear myoview 07/2011 and normal EF at that time;  c. Most recent echo 2/20 /13 - EF 55-60% wit mild-mod MR.;  d. s/p DCCV 01/12/2012  . Polymyalgia rheumatica ([place])    a. On chronic steroids  . Tubular adenoma of colon 04/2007   Past Surgical History:  Procedure Laterality Date  . BREAST LUMPECTOMY  1980's   right  x2 (benign)  . CARDIOVERSION  01/12/2012   Procedure: CARDIOVERSION;  Surgeon: Lelon Perla, MD;  Location: [place];  Service: Cardiovascular;  Laterality: N/A;  . CARDIOVERSION  03/28/2012   Procedure: CARDIOVERSION;  Surgeon: Larey Dresser, MD;  Location: [place];  Service: Cardiovascular;  Laterality: N/A;  . CARDIOVERSION  06/29/2012   Procedure: CARDIOVERSION;  Surgeon: Darlin Coco, MD;  [facility] ENDOSCOPY;  Service: Cardiovascular;  Laterality: N/A;  . CARDIOVERSION N/A 12/07/2015   Procedure: CARDIOVERSION;  Surgeon: Josue Hector, MD;  Location[facility] ENDOSCOPY;  Service: Cardiovascular;  Laterality: N/A;  . ELECTROPHYSIOLOGIC STUDY N/A 01/19/2016   AFib ablation by Dr Rayann Heman  . GLAUCOMA SURGERY    . TEE WITHOUT CARDIOVERSION N/A 01/19/2016   Procedure: TRANSESOPHAGEAL ECHOCARDIOGRAM (TEE);  Surgeon: Thayer Headings, MD;  Location: [place];  Service: Cardiovascular;  Laterality: N/A;  . TONSILLECTOMY  age 75    ROS- all systems are reviewed and negatives except as per HPI above  Current Outpatient Prescriptions  Medication Sig Dispense Refill  . alendronate (FOSAMAX) 70 MG tablet Take 70 mg by mouth every 7 (seven) days. Take with a full glass of water on an empty stomach.    Marland Kitchen atorvastatin (LIPITOR) 80 MG tablet TAKE 1 TABLET BY MOUTH EVERY DAY 30 tablet 10  . Cholecalciferol (VITAMIN D-3) 5000 UNITS TABS Take 1 tablet by mouth daily.     . Coenzyme Q10 (CO Q 10 PO) Take 1 tablet by mouth daily.    . ferrous sulfate 325 (65 FE) MG tablet Take 325 mg by mouth daily with breakfast.    . FIBER PO Take 2 tablets by mouth daily.     Marland Kitchen gabapentin (NEURONTIN) 100 MG capsule TAKE ONE CAPSULE BY MOUTH IN THE MORNING ,1 TABLET MID AFTERNOON & 2 TABLETS AT BEDTIME 120 capsule 6  . hydrochlorothiazide (HYDRODIURIL) 25 MG tablet Take  1 tablet (25 mg total) by mouth 3 (three) times a week. 36 tablet 3  . metoprolol succinate (TOPROL-XL) 25 MG 24 hr tablet Take 25 mg by mouth daily as needed (heart rate).    Marland Kitchen omeprazole (PRILOSEC) 20 MG capsule Take 1 capsule (20 mg total) by mouth daily. 90 capsule 3  . potassium chloride (K-DUR,KLOR-CON) 10 MEQ tablet Take 10 mEq by mouth 3 (three) times a week.     . predniSONE (DELTASONE) 1 MG tablet Take 3 mg by mouth daily with breakfast.     . rivaroxaban (XARELTO) 20 MG TABS tablet Take 20 mg by mouth daily with supper.    . valsartan (DIOVAN) 320 MG tablet Take 1 tablet (320 mg total) by mouth daily. 90 tablet 1  . Calcium-Vitamin D 600-200 MG-UNIT per tablet Take 1 tablet by  mouth daily.      No current facility-administered medications for this visit.     Physical Exam: Vitals:   07/25/16 1439  BP: 128/68  Pulse: 84  Weight: 120 lb (54.4 kg)  Height: 5' 2.5" (1.588 m)    GEN- The patient is well appearing, alert and oriented x 3 today.   Head- normocephalic, atraumatic Eyes-  Sclera clear, conjunctiva pink Ears- hearing intact Oropharynx- clear Lungs- Clear to ausculation bilaterally, normal work of breathing Heart- Regular rate and rhythm, no murmurs, rubs or gallops, PMI not laterally displaced GI- soft, NT, ND, + BS Extremities- no clubbing, cyanosis, or edema  ekg today reveals normal sinus rhythm, normal ekg  Assessment and Plan:  1. afib Well controlled post ablation off AAD therapy Continue xarelto for chad2vasc score of 4 Lifestyle modification discussed today  2. HTN Stable No change required today  Follow-up with Butch Penny in the AF clinic in 3 months Follow-up with Dr Caryl Comes in 6 months I will see when needed going forward  Thompson Grayer MD, [facility] 07/25/2016 3:21 PM

## 2016-07-25 NOTE — Patient Instructions (Signed)
Medication Instructions:  Your physician recommends that you continue on your current medications as directed. Please refer to the Current Medication list given to you today.   Labwork: None ordered   Testing/Procedures: None ordered   Follow-Up: Your physician recommends that you schedule a follow-up appointment in: 3 months with Roderic Palau, NP and 6 months with Dr Caryl Comes   Any Other Special Instructions Will Be Listed Below (If Applicable).     If you need a refill on your cardiac medications before your next appointment, please call your pharmacy.

## 2016-08-01 ENCOUNTER — Telehealth (HOSPITAL_COMMUNITY): Payer: Self-pay | Admitting: *Deleted

## 2016-08-01 NOTE — Telephone Encounter (Signed)
Pt called in to report she has had an episode of afib the last two days. Each episode has lasted less than 1 hour. Pt has taken the PRN toprol with each episode. Instructed pt to keep a record of afib episodes if frequency/duration continue to increase to call back and would bring her in to discuss daily medication to reduce afib episodes. Pt verbalized understanding.

## 2016-08-12 DIAGNOSIS — B351 Tinea unguium: Secondary | ICD-10-CM | POA: Diagnosis not present

## 2016-08-12 DIAGNOSIS — L814 Other melanin hyperpigmentation: Secondary | ICD-10-CM | POA: Diagnosis not present

## 2016-08-12 DIAGNOSIS — L821 Other seborrheic keratosis: Secondary | ICD-10-CM | POA: Diagnosis not present

## 2016-08-12 DIAGNOSIS — L718 Other rosacea: Secondary | ICD-10-CM | POA: Diagnosis not present

## 2016-08-12 DIAGNOSIS — D0439 Carcinoma in situ of skin of other parts of face: Secondary | ICD-10-CM | POA: Diagnosis not present

## 2016-08-16 ENCOUNTER — Other Ambulatory Visit (HOSPITAL_COMMUNITY): Payer: Self-pay | Admitting: *Deleted

## 2016-08-16 MED ORDER — POTASSIUM CHLORIDE CRYS ER 10 MEQ PO TBCR: 10.0000 meq | EXTENDED_RELEASE_TABLET | ORAL | 6 refills | Status: DC

## 2016-08-18 ENCOUNTER — Ambulatory Visit: Payer: PPO | Admitting: Neurology

## 2016-08-20 ENCOUNTER — Other Ambulatory Visit: Payer: Self-pay | Admitting: Neurology

## 2016-08-22 DIAGNOSIS — L718 Other rosacea: Secondary | ICD-10-CM | POA: Diagnosis not present

## 2016-08-22 DIAGNOSIS — D0439 Carcinoma in situ of skin of other parts of face: Secondary | ICD-10-CM | POA: Diagnosis not present

## 2016-08-23 ENCOUNTER — Telehealth (HOSPITAL_COMMUNITY): Payer: Self-pay | Admitting: *Deleted

## 2016-08-23 NOTE — Telephone Encounter (Signed)
Patient called in stating she is having an increase in afib burden. Episodes can last from 15 mins up to 2 hours. Majority of the time she will take a metoprolol to help convert back to NSR. Episodes do not seem to correlate with anything she does report to being slightly more stressed with recent family death. Discussed with Roderic Palau NP recommended starting metoprolol XL 12.5mg  daily for next week and see if this helps reduce afib burden. Pt will call back with report in week.

## 2016-08-30 DIAGNOSIS — I10 Essential (primary) hypertension: Secondary | ICD-10-CM | POA: Diagnosis not present

## 2016-08-30 DIAGNOSIS — R946 Abnormal results of thyroid function studies: Secondary | ICD-10-CM | POA: Diagnosis not present

## 2016-09-01 DIAGNOSIS — M353 Polymyalgia rheumatica: Secondary | ICD-10-CM | POA: Diagnosis not present

## 2016-09-01 DIAGNOSIS — Z6821 Body mass index (BMI) 21.0-21.9, adult: Secondary | ICD-10-CM | POA: Diagnosis not present

## 2016-09-01 DIAGNOSIS — I480 Paroxysmal atrial fibrillation: Secondary | ICD-10-CM | POA: Diagnosis not present

## 2016-09-01 DIAGNOSIS — R946 Abnormal results of thyroid function studies: Secondary | ICD-10-CM | POA: Diagnosis not present

## 2016-09-01 DIAGNOSIS — I10 Essential (primary) hypertension: Secondary | ICD-10-CM | POA: Diagnosis not present

## 2016-09-01 DIAGNOSIS — R159 Full incontinence of feces: Secondary | ICD-10-CM | POA: Diagnosis not present

## 2016-09-07 ENCOUNTER — Other Ambulatory Visit: Payer: Self-pay | Admitting: Internal Medicine

## 2016-09-12 ENCOUNTER — Other Ambulatory Visit: Payer: Self-pay

## 2016-09-12 MED ORDER — RIVAROXABAN 20 MG PO TABS: 20.0000 mg | ORAL_TABLET | Freq: Every day | ORAL | 2 refills | Status: DC

## 2016-09-21 DIAGNOSIS — H40033 Anatomical narrow angle, bilateral: Secondary | ICD-10-CM | POA: Diagnosis not present

## 2016-09-21 DIAGNOSIS — H2513 Age-related nuclear cataract, bilateral: Secondary | ICD-10-CM | POA: Diagnosis not present

## 2016-09-21 DIAGNOSIS — H40013 Open angle with borderline findings, low risk, bilateral: Secondary | ICD-10-CM | POA: Diagnosis not present

## 2016-10-20 ENCOUNTER — Ambulatory Visit (HOSPITAL_COMMUNITY)
Admission: RE | Admit: 2016-10-20 | Discharge: 2016-10-20 | Disposition: A | Discharge status: Home / Self Care | Payer: PPO | Source: Ambulatory Visit | Attending: Nurse Practitioner | Admitting: Nurse Practitioner

## 2016-10-20 ENCOUNTER — Encounter (HOSPITAL_COMMUNITY): Payer: Self-pay | Admitting: Nurse Practitioner

## 2016-10-20 VITALS — BP 112/64 | HR 87 | Ht 62.5 in | Wt 119.0 lb

## 2016-10-20 DIAGNOSIS — I480 Paroxysmal atrial fibrillation: Secondary | ICD-10-CM

## 2016-10-20 DIAGNOSIS — I4891 Unspecified atrial fibrillation: Secondary | ICD-10-CM | POA: Insufficient documentation

## 2016-10-20 NOTE — Progress Notes (Signed)
Primary Care Physician: Jerlyn Ly, MD Referring Physician: Dr. Caryl Comes EP: Dr. Juanetta Snow Lorraine Little is a 75 y.o. female with a h/o aifb ablation 01/19/16 and has done well without afib since procedure. She called the office 8/24 c/o that her BP was running lower than usual with BP in the 99991111 systolic in the am but would normalize toward the pm. She was advised to stop amlodipine and to come in today. This is the second morning she did not take amlodipine. BP was in the 80's again. Now at A999333 systolic. She drinks a couple of glasses of tea each day but no water. No change in health or meds. Denies any diarrhea or excessively being out in  the heat.  Labs were drawn which showed  low K+, na and cl. K+ was replaced and her combo BP was stopped and she continued   on valsartan 320 mg daily. HCTZ was changed to 3x a week. Amlodipine on hold. TSH was checked and showed mildly abnormal result at 0.302.   She returns today, 9/8 and is feeling better. No further low BP readings. She did f/u with her PCP and bmet was improved and K+ replacement was changed to 3x a week. PCP felt that thyroid was reflection of being on amiodarone which pt stopped in July, and he will f/u with a thyroid panel in 6 weeks. No issues with afib.  F/u in afib clinic 12/21. Since she started back on daily metoprolol, her afib burden has diminished but she has noted some fatigue, suggested to move to bedtime.  Today, she denies symptoms of palpitations, chest pain, shortness of breath, orthopnea, PND, lower extremity edema, dizziness, presyncope, syncope, or neurologic sequela. The patient is tolerating medications without difficulties and is otherwise without complaint today.   Past Medical History:  Diagnosis Date  . Adrenal insufficiency (HCC)    takes prednisone 5mg  daily  . Cervical disc disease   . CVA (cerebral infarction)    a. 11/2011  . Depression   . Diverticulosis   . GERD (gastroesophageal reflux  disease)    pt denies symptoms with this  . Glaucoma   . Hyperlipidemia    a. Diagnosed 12/2011  . Hypertension   . Insomnia with sleep apnea   . Internal hemorrhoid   . Migraine headache   . Osteoarthritis   . Osteoporosis   . Persistent atrial fibrillation ([place])    a.  Diagnosed 07/2011;  b. Normal nuclear myoview 07/2011 and normal EF at that time;  c. Most recent echo 2/20 /13 - EF 55-60% wit mild-mod MR.;  d. s/p DCCV 01/12/2012  . Polymyalgia rheumatica ([place])    a. On chronic steroids  . Tubular adenoma of colon 04/2007   Past Surgical History:  Procedure Laterality Date  . BREAST LUMPECTOMY  1980's   right  x2 (benign)  . CARDIOVERSION  01/12/2012   Procedure: CARDIOVERSION;  Surgeon: Lelon Perla, MD;  Location: [place];  Service: Cardiovascular;  Laterality: N/A;  . CARDIOVERSION  03/28/2012   Procedure: CARDIOVERSION;  Surgeon: Larey Dresser, MD;  Location: [place];  Service: Cardiovascular;  Laterality: N/A;  . CARDIOVERSION  06/29/2012   Procedure: CARDIOVERSION;  Surgeon: Darlin Coco, MD;  [facility] ENDOSCOPY;  Service: Cardiovascular;  Laterality: N/A;  . CARDIOVERSION N/A 12/07/2015   Procedure: CARDIOVERSION;  Surgeon: Josue Hector, MD;  Location: [place];  Service: Cardiovascular;  Laterality: N/A;  . ELECTROPHYSIOLOGIC STUDY N/A 01/19/2016  AFib ablation by Dr Rayann Heman  . GLAUCOMA SURGERY    . TEE WITHOUT CARDIOVERSION N/A 01/19/2016   Procedure: TRANSESOPHAGEAL ECHOCARDIOGRAM (TEE);  Surgeon: Thayer Headings, MD;  Location: [place];  Service: Cardiovascular;  Laterality: N/A;  . TONSILLECTOMY  age 64    Current Outpatient Prescriptions  Medication Sig Dispense Refill  . alendronate (FOSAMAX) 70 MG tablet Take 70 mg by mouth every 7 (seven) days. Take with a full glass of water on an empty stomach.    Marland Kitchen atorvastatin (LIPITOR) 80 MG tablet TAKE 1 TABLET BY MOUTH EVERY DAY 30 tablet 10  . Calcium-Vitamin D 600-200 MG-UNIT per tablet Take 1 tablet by  mouth daily.     . Cholecalciferol (VITAMIN D-3) 5000 UNITS TABS Take 1 tablet by mouth daily.     . Coenzyme Q10 (CO Q 10 PO) Take 1 tablet by mouth daily.    . ferrous sulfate 325 (65 FE) MG tablet Take 325 mg by mouth daily with breakfast.    . FIBER PO Take 2 tablets by mouth daily.     Marland Kitchen gabapentin (NEURONTIN) 100 MG capsule TAKE ONE CAPSULE BY MOUTH IN THE MORNING ,1 TABLET MID AFTERNOON & 2 TABLETS AT BEDTIME 120 capsule 6  . hydrochlorothiazide (HYDRODIURIL) 25 MG tablet Take 1 tablet (25 mg total) by mouth 3 (three) times a week. 36 tablet 3  . metoprolol succinate (TOPROL-XL) 25 MG 24 hr tablet Take 12.5 mg by mouth daily as needed (heart rate).     Marland Kitchen omeprazole (PRILOSEC) 20 MG capsule Take 1 capsule (20 mg total) by mouth daily. 90 capsule 3  . potassium chloride (K-DUR,KLOR-CON) 10 MEQ tablet Take 1 tablet (10 mEq total) by mouth 3 (three) times a week. 15 tablet 6  . predniSONE (DELTASONE) 1 MG tablet Take 3 mg by mouth daily with breakfast.     . rivaroxaban (XARELTO) 20 MG TABS tablet Take 1 tablet (20 mg total) by mouth daily with supper. 90 tablet 2  . valsartan (DIOVAN) 320 MG tablet Take 1 tablet (320 mg total) by mouth daily. 90 tablet 1   No current facility-administered medications for this encounter.     No Known Allergies  Social History   Social History  . Marital status: Married    Spouse name: [name]  . Number of children: 3  . Years of education: college   Occupational History  . retired      Pharmacist, hospital , [place] History Main Topics  . Smoking status: Never Smoker  . Smokeless tobacco: Never Used  . Alcohol use 0.6 oz/week    1 Glasses of wine per week     Comment: rare  . Drug use: No  . Sexual activity: Not Currently   Other Topics Concern  . Not on file   Social History Narrative   Pt lives in [place] with spouse.  [name])  She was previously a Education officer, museum.    Caffeine- None   Right handed   Patient has three children.   Patient  has a college education.    Family History  Problem Relation Age of Onset  . Stroke Mother     Deceased @ 28  . Heart failure Mother     died from  . Liver cancer Father     Deceased @ 57  . Colon cancer Paternal Grandmother     ROS- All systems are reviewed and negative except as per the HPI above  Physical Exam: Vitals:  10/20/16 1002  BP: 112/64  Pulse: 87  Weight: 119 lb (54 kg)  Height: 5' 2.5" (1.588 m)    GEN- The patient is well appearing, alert and oriented x 3 today.   Head- normocephalic, atraumatic Eyes-  Sclera clear, conjunctiva pink Ears- hearing intact Oropharynx- clear Neck- supple, no JVP Lymph- no cervical lymphadenopathy Lungs- Clear to ausculation bilaterally, normal work of breathing Heart- Regular rate and rhythm, no murmurs, rubs or gallops, PMI not laterally displaced GI- soft, NT, ND, + BS Extremities- no clubbing, cyanosis, or edema MS- no significant deformity or atrophy Skin- no rash or lesion Psych- euthymic mood, full affect Neuro- strength and sensation are intact  EKG- NSR at 87 bpm, pr int 208 ms, qrs int 72 ms, qtc 447 ms   Assessment and Plan: 1. Hypertension  Improved with adjustment of BP meds  2. Afib In SR s/p ablation in March Off amiodarone  Continue metoprolol, move to bedtime for fatigue Continue xarelto with chadsvasc score of at least 4   F/u  with Dr. Jens Som in 3 months afib clinic as needed   Butch Penny C. Nomar Broad, [facility] [address] [place], [place] [address] [phone]

## 2016-11-11 ENCOUNTER — Ambulatory Visit (HOSPITAL_COMMUNITY)
Admission: RE | Admit: 2016-11-11 | Discharge: 2016-11-11 | Disposition: A | Discharge status: Home / Self Care | Payer: PPO | Source: Ambulatory Visit | Attending: Nurse Practitioner | Admitting: Nurse Practitioner

## 2016-11-11 VITALS — BP 152/90 | HR 82 | Ht 62.5 in | Wt 119.8 lb

## 2016-11-11 DIAGNOSIS — E785 Hyperlipidemia, unspecified: Secondary | ICD-10-CM | POA: Diagnosis not present

## 2016-11-11 DIAGNOSIS — Z7901 Long term (current) use of anticoagulants: Secondary | ICD-10-CM | POA: Insufficient documentation

## 2016-11-11 DIAGNOSIS — Z7952 Long term (current) use of systemic steroids: Secondary | ICD-10-CM | POA: Diagnosis not present

## 2016-11-11 DIAGNOSIS — I10 Essential (primary) hypertension: Secondary | ICD-10-CM | POA: Insufficient documentation

## 2016-11-11 DIAGNOSIS — I4891 Unspecified atrial fibrillation: Secondary | ICD-10-CM

## 2016-11-11 DIAGNOSIS — Z79899 Other long term (current) drug therapy: Secondary | ICD-10-CM | POA: Diagnosis not present

## 2016-11-11 DIAGNOSIS — M810 Age-related osteoporosis without current pathological fracture: Secondary | ICD-10-CM | POA: Insufficient documentation

## 2016-11-11 DIAGNOSIS — Z8673 Personal history of transient ischemic attack (TIA), and cerebral infarction without residual deficits: Secondary | ICD-10-CM | POA: Diagnosis not present

## 2016-11-11 DIAGNOSIS — Z7983 Long term (current) use of bisphosphonates: Secondary | ICD-10-CM | POA: Insufficient documentation

## 2016-11-11 DIAGNOSIS — M353 Polymyalgia rheumatica: Secondary | ICD-10-CM | POA: Diagnosis not present

## 2016-11-11 DIAGNOSIS — K219 Gastro-esophageal reflux disease without esophagitis: Secondary | ICD-10-CM | POA: Diagnosis not present

## 2016-11-11 DIAGNOSIS — I481 Persistent atrial fibrillation: Secondary | ICD-10-CM | POA: Insufficient documentation

## 2016-11-11 DIAGNOSIS — R079 Chest pain, unspecified: Secondary | ICD-10-CM | POA: Diagnosis not present

## 2016-11-11 LAB — TROPONIN I: Troponin I: <0.03 ng/mL (ref <0.03)

## 2016-11-11 NOTE — Patient Instructions (Signed)
Scheduler will call you to set up stress test.

## 2016-11-11 NOTE — Progress Notes (Signed)
Primary Care Physician: Jerlyn Ly, MD Referring Physician: Dr. Caryl Comes EP: Dr. Juanetta Snow Lorraine Little is a 76 y.o. female with a h/o aifb ablation 01/19/16 and has done well without afib since procedure. She called the office 8/24 c/o that her BP was running lower than usual with BP in the 99991111 systolic in the am but would normalize toward the pm. She was advised to stop amlodipine and to come in today. This is the second morning she did not take amlodipine. BP was in the 80's again. Now at A999333 systolic. She drinks a couple of glasses of tea each day but no water. No change in health or meds. Denies any diarrhea or excessively being out in  the heat.  Labs were drawn which showed  low K+, na and cl. K+ was replaced and her combo BP was stopped and she continued   on valsartan 320 mg daily. HCTZ was changed to 3x a week. Amlodipine on hold. TSH was checked and showed mildly abnormal result at 0.302.   She returns today, 9/8 and is feeling better. No further low BP readings. She did f/u with her PCP and bmet was improved and K+ replacement was changed to 3x a week. PCP felt that thyroid was reflection of being on amiodarone which pt stopped in July, and he will f/u with a thyroid panel in 6 weeks. No issues with afib.  F/u in afib clinic 12/21. Since she started back on daily metoprolol, her afib burden has diminished but she has noted some fatigue, suggested to move to bedtime.  Asked to be seen urgently 1/12 for an episode of chest pain that occurred  at 3 am last night. She woke up with a sensation of being cold, then she was aware that she got really hot. She developed nausea to the point that she thought  that she may vomit and took off her CPAP. She then noted chest pressure that radiated thru her anterior chest which she felt thru to her back and up into her neck. She checked her BP/HR and both were normal. She was not in afib and has not had any episodes prior to this. She denies  chest exertion/ shortness of breath with activity. Ekg today shows SR without any st/t wave changes. She feels somewhat weak but no further chest pain.  Today, she denies symptoms of palpitations, chest pain, shortness of breath, orthopnea, PND, lower extremity edema, dizziness, presyncope, syncope, or neurologic sequela. The patient is tolerating medications without difficulties and is otherwise without complaint today.   Past Medical History:  Diagnosis Date  . Adrenal insufficiency (HCC)    takes prednisone 5mg  daily  . Cervical disc disease   . CVA (cerebral infarction)    a. 11/2011  . Depression   . Diverticulosis   . GERD (gastroesophageal reflux disease)    pt denies symptoms with this  . Glaucoma   . Hyperlipidemia    a. Diagnosed 12/2011  . Hypertension   . Insomnia with sleep apnea   . Internal hemorrhoid   . Migraine headache   . Osteoarthritis   . Osteoporosis   . Persistent atrial fibrillation ([place])    a.  Diagnosed 07/2011;  b. Normal nuclear myoview 07/2011 and normal EF at that time;  c. Most recent echo 2/20 /13 - EF 55-60% wit mild-mod MR.;  d. s/p DCCV 01/12/2012  . Polymyalgia rheumatica ([place])    a. On chronic steroids  . Tubular  adenoma of colon 04/2007   Past Surgical History:  Procedure Laterality Date  . BREAST LUMPECTOMY  1980's   right  x2 (benign)  . CARDIOVERSION  01/12/2012   Procedure: CARDIOVERSION;  Surgeon: Lelon Perla, MD;  Location: [place];  Service: Cardiovascular;  Laterality: N/A;  . CARDIOVERSION  03/28/2012   Procedure: CARDIOVERSION;  Surgeon: Larey Dresser, MD;  Location: [place];  Service: Cardiovascular;  Laterality: N/A;  . CARDIOVERSION  06/29/2012   Procedure: CARDIOVERSION;  Surgeon: Darlin Coco, MD;  Location: [facility] ENDOSCOPY;  Service: Cardiovascular;  Laterality: N/A;  . CARDIOVERSION N/A 12/07/2015   Procedure: CARDIOVERSION;  Surgeon: Josue Hector, MD;  Location: [facility] ENDOSCOPY;  Service: Cardiovascular;  Laterality: N/A;  .  ELECTROPHYSIOLOGIC STUDY N/A 01/19/2016   AFib ablation by Dr Rayann Heman  . GLAUCOMA SURGERY    . TEE WITHOUT CARDIOVERSION N/A 01/19/2016   Procedure: TRANSESOPHAGEAL ECHOCARDIOGRAM (TEE);  Surgeon: Thayer Headings, MD;  Location: [place];  Service: Cardiovascular;  Laterality: N/A;  . TONSILLECTOMY  age 31    Current Outpatient Prescriptions  Medication Sig Dispense Refill  . alendronate (FOSAMAX) 70 MG tablet Take 70 mg by mouth every 7 (seven) days. Take with a full glass of water on an empty stomach.    Marland Kitchen atorvastatin (LIPITOR) 80 MG tablet TAKE 1 TABLET BY MOUTH EVERY DAY 30 tablet 10  . Calcium-Vitamin D 600-200 MG-UNIT per tablet Take 1 tablet by mouth daily.     . Cholecalciferol (VITAMIN D-3) 5000 UNITS TABS Take 1 tablet by mouth daily.     . Coenzyme Q10 (CO Q 10 PO) Take 1 tablet by mouth daily.    . ferrous sulfate 325 (65 FE) MG tablet Take 325 mg by mouth daily with breakfast.    . FIBER PO Take 2 tablets by mouth daily.     Marland Kitchen gabapentin (NEURONTIN) 100 MG capsule TAKE ONE CAPSULE BY MOUTH IN THE MORNING ,1 TABLET MID AFTERNOON & 2 TABLETS AT BEDTIME 120 capsule 6  . hydrochlorothiazide (HYDRODIURIL) 25 MG tablet Take 1 tablet (25 mg total) by mouth 3 (three) times a week. 36 tablet 3  . metoprolol succinate (TOPROL-XL) 25 MG 24 hr tablet Take 12.5 mg by mouth daily.     Marland Kitchen omeprazole (PRILOSEC) 20 MG capsule Take 1 capsule (20 mg total) by mouth daily. 90 capsule 3  . potassium chloride (K-DUR,KLOR-CON) 10 MEQ tablet Take 1 tablet (10 mEq total) by mouth 3 (three) times a week. 15 tablet 6  . predniSONE (DELTASONE) 1 MG tablet Take 3 mg by mouth daily with breakfast.     . rivaroxaban (XARELTO) 20 MG TABS tablet Take 1 tablet (20 mg total) by mouth daily with supper. 90 tablet 2  . valsartan (DIOVAN) 320 MG tablet Take 1 tablet (320 mg total) by mouth daily. 90 tablet 1   No current facility-administered medications for this encounter.     No Known Allergies  Social  History   Social History  . Marital status: Married    Spouse name: [name]  . Number of children: 3  . Years of education: college   Occupational History  . retired      Pharmacist, hospital , [place] History Main Topics  . Smoking status: Never Smoker  . Smokeless tobacco: Never Used  . Alcohol use 0.6 oz/week    1 Glasses of wine per week     Comment: rare  . Drug use: No  . Sexual activity: Not  Currently   Other Topics Concern  . Not on file   Social History Narrative   Pt lives in [place] with spouse.  [name])  She was previously a Education officer, museum.    Caffeine- None   Right handed   Patient has three children.   Patient has a college education.    Family History  Problem Relation Age of Onset  . Stroke Mother     Deceased @ 27  . Heart failure Mother     died from  . Liver cancer Father     Deceased @ 36  . Colon cancer Paternal Grandmother     ROS- All systems are reviewed and negative except as per the HPI above  Physical Exam: Vitals:   11/11/16 1132  BP: (!) 152/90  Pulse: 82  Weight: 119 lb 12.8 oz (54.3 kg)  Height: 5' 2.5" (1.588 m)    GEN- The patient is well appearing, alert and oriented x 3 today.   Head- normocephalic, atraumatic Eyes-  Sclera clear, conjunctiva pink Ears- hearing intact Oropharynx- clear Neck- supple, no JVP Lymph- no cervical lymphadenopathy Lungs- Clear to ausculation bilaterally, normal work of breathing Heart- Regular rate and rhythm, no murmurs, rubs or gallops, PMI not laterally displaced GI- soft, NT, ND, + BS Extremities- no clubbing, cyanosis, or edema MS- no significant deformity or atrophy Skin- no rash or lesion Psych- euthymic mood, full affect Neuro- strength and sensation are intact  EKG- NSR at 82 bpm, pr int 180 ms, qrs int 72 ms, qtc 425 ms   Assessment and Plan: 1. Episode of chest pain with associated anterior chest tightness with back and neck radiation/nausea EKG negative for acute  changes Troponin negative Stress test will be scheduled  Advised if she has similar symptoms in the  future to call 911 and go to the  ER  2.  Hypertension  Stable  3. Afib Quiet In SR s/p ablation in March Off amiodarone  Continue metoprolol daily Continue xarelto with chadsvasc score of at least 4   F/u  with Dr. Caryl Comes as scheduled afib clinic as needed   Butch Penny C. Finis Hendricksen, [facility] [address] [place], [place] [address] [phone]

## 2016-11-14 ENCOUNTER — Telehealth (HOSPITAL_COMMUNITY): Payer: Self-pay | Admitting: *Deleted

## 2016-11-14 NOTE — Telephone Encounter (Signed)
Patient given detailed instructions per Myocardial Perfusion Study Information Sheet for the test on 11/17/16 at 1000. Patient notified to arrive 15 minutes early and that it is imperative to arrive on time for appointment to keep from having the test rescheduled.  If you need to cancel or reschedule your appointment, please call the office within 24 hours of your appointment. Failure to do so may result in a cancellation of your appointment, and a $50 no show fee. Patient verbalized understanding.Nina Hoar W    

## 2016-11-15 ENCOUNTER — Telehealth (HOSPITAL_COMMUNITY): Payer: Self-pay | Admitting: *Deleted

## 2016-11-15 NOTE — Telephone Encounter (Signed)
Pt called reporting being out of rhythm with a HR of 155.  Pt was asked to take her BP and her reading was only 99/70 upon second check.  Pt stated she had taken a whole 25 mg Metoprolol at 7:15 this morning.  Pt was advised to drink water to raise her BP and if she is not feeling altogether too bad, try and rest.  Pt was advised if she is still having fast heart rate and her BP is OVER 100 then she may take another metoprolol tonight.  Pt understood that she did not have a whole lot of BP to work with to adjust meds and that if she feels any worse to call back immediately or if HR goes up more to go to the ER for emergent cardioversion.  If it does not go up, recheck all vitals at about 4:00 today and call our clinic to give Roderic Palau, NP these readings so that she could determine next move.  Pt understood.

## 2016-11-17 ENCOUNTER — Encounter (HOSPITAL_COMMUNITY): Payer: PPO

## 2016-11-21 ENCOUNTER — Telehealth (HOSPITAL_COMMUNITY): Payer: Self-pay | Admitting: *Deleted

## 2016-11-21 NOTE — Telephone Encounter (Signed)
Patient given detailed instructions per Myocardial Perfusion Study Information Sheet for the test on 11/23/16 at 1000. Patient notified to arrive 15 minutes early and that it is imperative to arrive on time for appointment to keep from having the test rescheduled.  If you need to cancel or reschedule your appointment, please call the office within 24 hours of your appointment. Failure to do so may result in a cancellation of your appointment, and a $50 no show fee. Patient verbalized understanding.Phyllis Whitefield, [facility]ana[facility]ms

## 2016-11-23 ENCOUNTER — Other Ambulatory Visit (HOSPITAL_COMMUNITY): Payer: Self-pay | Admitting: Nurse Practitioner

## 2016-11-23 ENCOUNTER — Ambulatory Visit (HOSPITAL_COMMUNITY): Payer: PPO | Attending: Cardiology

## 2016-11-23 DIAGNOSIS — I4891 Unspecified atrial fibrillation: Secondary | ICD-10-CM | POA: Diagnosis not present

## 2016-11-23 LAB — MYOCARDIAL PERFUSION IMAGING
CHL CUP MPHR: 143 {beats}/min
CHL CUP NUCLEAR SSS: 0
CHL CUP RESTING HR STRESS: 79 {beats}/min
CHL RATE OF PERCEIVED EXERTION: 18
CSEPEDS: 1 s
CSEPHR: 90 %
Estimated workload: 7 METS
Exercise duration (min): 5 min
LHR: 0.26
LV dias vol: 59 mL (ref 46–106)
LV sys vol: 15 mL
Peak HR: 129 {beats}/min
SDS: 0
SRS: 0
TID: 1.02

## 2016-11-23 MED ORDER — TECHNETIUM TC 99M TETROFOSMIN IV KIT
11.0000 | PACK | Freq: Once | INTRAVENOUS | Status: AC | PRN
  Administered 2016-11-23: 11 via INTRAVENOUS
  Filled 2016-11-23: qty 11

## 2016-11-23 MED ORDER — TECHNETIUM TC 99M TETROFOSMIN IV KIT
32.8000 | PACK | Freq: Once | INTRAVENOUS | Status: AC | PRN
Start: 2016-11-23 — End: 2016-11-23
  Administered 2016-11-23: 32.8 via INTRAVENOUS
  Filled 2016-11-23: qty 33

## 2016-11-29 DIAGNOSIS — Z85828 Personal history of other malignant neoplasm of skin: Secondary | ICD-10-CM | POA: Diagnosis not present

## 2016-11-29 DIAGNOSIS — L98499 Non-pressure chronic ulcer of skin of other sites with unspecified severity: Secondary | ICD-10-CM | POA: Diagnosis not present

## 2016-11-29 DIAGNOSIS — L718 Other rosacea: Secondary | ICD-10-CM | POA: Diagnosis not present

## 2016-11-29 DIAGNOSIS — L858 Other specified epidermal thickening: Secondary | ICD-10-CM | POA: Diagnosis not present

## 2016-12-01 ENCOUNTER — Telehealth (HOSPITAL_COMMUNITY): Payer: Self-pay | Admitting: *Deleted

## 2016-12-01 NOTE — Telephone Encounter (Signed)
Pt called in stating when she had her stress test last week she went into afib and was instructed by Dr. Rayann Heman to increase metoprolol to 25mg  daily and take an extra 25mg  for breakthrough afib. Since then patient has had 2 more episodes of breakthrough afib which has required extra metoprolol but has converted within less than 30 minutes of taking the extra dose. Pt wanted these episodes documented in her chart. Pt to call if frequency continues to increase.

## 2016-12-05 ENCOUNTER — Telehealth (HOSPITAL_COMMUNITY): Payer: Self-pay | Admitting: *Deleted

## 2016-12-05 NOTE — Telephone Encounter (Signed)
Patient states she has returned to NSR 104/71 HR 74. Feeling much better. Pt to see Dr. Rayann Heman on Friday to discuss increasing frequency of breakthrough afib. Pt will call if issues arise before then.

## 2016-12-05 NOTE — Telephone Encounter (Signed)
Patient called in stating she has been in afib most of the weekend states her HR was in the 150s most of the time yesterday. She did take extra metoprolol mid day yesterday along with her normal dose of metoprolol. She thought she returned to NSR overnight but by  9am she felt to be back in afib HR readings in the 150-170 range. Pt just feels like she is shaking out of her skin. She last took 25mg  of metoprolol at 9am. Instructed pt to take another metoprolol now and if in 43min to 1 hr she is still running fast she should report to there ER for further treatment. If the extra metoprolol returns her to NSR then recommended follow up to discuss frequent breakthrough afib. Pt agreeable.

## 2016-12-09 ENCOUNTER — Telehealth: Payer: Self-pay | Admitting: Internal Medicine

## 2016-12-09 ENCOUNTER — Encounter (HOSPITAL_COMMUNITY): Payer: Self-pay | Admitting: Nurse Practitioner

## 2016-12-09 ENCOUNTER — Ambulatory Visit (HOSPITAL_COMMUNITY)
Admission: RE | Admit: 2016-12-09 | Discharge: 2016-12-09 | Disposition: A | Discharge status: Home / Self Care | Payer: PPO | Source: Ambulatory Visit | Attending: Nurse Practitioner | Admitting: Nurse Practitioner

## 2016-12-09 VITALS — BP 124/68 | HR 80 | Ht 62.0 in | Wt 121.0 lb

## 2016-12-09 DIAGNOSIS — M1990 Unspecified osteoarthritis, unspecified site: Secondary | ICD-10-CM | POA: Diagnosis not present

## 2016-12-09 DIAGNOSIS — Z7901 Long term (current) use of anticoagulants: Secondary | ICD-10-CM | POA: Diagnosis not present

## 2016-12-09 DIAGNOSIS — G4733 Obstructive sleep apnea (adult) (pediatric): Secondary | ICD-10-CM

## 2016-12-09 DIAGNOSIS — G4700 Insomnia, unspecified: Secondary | ICD-10-CM | POA: Diagnosis not present

## 2016-12-09 DIAGNOSIS — I10 Essential (primary) hypertension: Secondary | ICD-10-CM | POA: Diagnosis not present

## 2016-12-09 DIAGNOSIS — I480 Paroxysmal atrial fibrillation: Secondary | ICD-10-CM

## 2016-12-09 DIAGNOSIS — R002 Palpitations: Secondary | ICD-10-CM | POA: Insufficient documentation

## 2016-12-09 DIAGNOSIS — E785 Hyperlipidemia, unspecified: Secondary | ICD-10-CM | POA: Diagnosis not present

## 2016-12-09 DIAGNOSIS — G4730 Sleep apnea, unspecified: Secondary | ICD-10-CM | POA: Diagnosis not present

## 2016-12-09 DIAGNOSIS — Z9989 Dependence on other enabling machines and devices: Secondary | ICD-10-CM

## 2016-12-09 DIAGNOSIS — I481 Persistent atrial fibrillation: Secondary | ICD-10-CM | POA: Insufficient documentation

## 2016-12-09 DIAGNOSIS — I4891 Unspecified atrial fibrillation: Secondary | ICD-10-CM

## 2016-12-09 DIAGNOSIS — F329 Major depressive disorder, single episode, unspecified: Secondary | ICD-10-CM | POA: Insufficient documentation

## 2016-12-09 DIAGNOSIS — M810 Age-related osteoporosis without current pathological fracture: Secondary | ICD-10-CM | POA: Insufficient documentation

## 2016-12-09 DIAGNOSIS — Z8673 Personal history of transient ischemic attack (TIA), and cerebral infarction without residual deficits: Secondary | ICD-10-CM | POA: Insufficient documentation

## 2016-12-09 DIAGNOSIS — E2740 Unspecified adrenocortical insufficiency: Secondary | ICD-10-CM | POA: Diagnosis not present

## 2016-12-09 DIAGNOSIS — K219 Gastro-esophageal reflux disease without esophagitis: Secondary | ICD-10-CM | POA: Diagnosis not present

## 2016-12-09 DIAGNOSIS — M5080 Other cervical disc disorders, unspecified cervical region: Secondary | ICD-10-CM | POA: Insufficient documentation

## 2016-12-09 NOTE — Progress Notes (Signed)
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  When I saw her last, she was doing very well.  Over the past few months, she has developed recurrent palpitations.  Though she thinks she is having afib, this has not been documented.  Episodes are short.  Her afib previously was refractory.  Today, she denies symptoms of chest pain, shortness of breath,  lower extremity edema, dizziness, presyncope, or syncope.  The patient is otherwise without complaint today.   Past Medical History:  Diagnosis Date  . Adrenal insufficiency (HCC)    takes prednisone 5mg  daily  . Cervical disc disease   . CVA (cerebral infarction)    a. 11/2011  . Depression   . Diverticulosis   . GERD (gastroesophageal reflux disease)    pt denies symptoms with this  . Glaucoma   . Hyperlipidemia    a. Diagnosed 12/2011  . Hypertension   . Insomnia with sleep apnea   . Internal hemorrhoid   . Migraine headache   . Osteoarthritis   . Osteoporosis   . Persistent atrial fibrillation ([place])    a.  Diagnosed 07/2011;  b. Normal nuclear myoview 07/2011 and normal EF at that time;  c. Most recent echo 2/20 /13 - EF 55-60% wit mild-mod MR.;  d. s/p DCCV 01/12/2012  . Polymyalgia rheumatica ([place])    a. On chronic steroids  . Tubular adenoma of colon 04/2007   Past Surgical History:  Procedure Laterality Date  . BREAST LUMPECTOMY  1980's   right  x2 (benign)  . CARDIOVERSION  01/12/2012   Procedure: CARDIOVERSION;  Surgeon: Lelon Perla, MD;  Location: [place];  Service: Cardiovascular;  Laterality: N/A;  . CARDIOVERSION  03/28/2012   Procedure: CARDIOVERSION;  Surgeon: Larey Dresser, MD;  Location: [place];  Service: Cardiovascular;  Laterality: N/A;  . CARDIOVERSION  06/29/2012   Procedure: CARDIOVERSION;  Surgeon: Darlin Coco, MD;  Location: [facility] ENDOSCOPY;  Service: Cardiovascular;  Laterality: N/A;  . CARDIOVERSION N/A  12/07/2015   Procedure: CARDIOVERSION;  Surgeon: Josue Hector, MD;  Location: [facility] ENDOSCOPY;  Service: Cardiovascular;  Laterality: N/A;  . ELECTROPHYSIOLOGIC STUDY N/A 01/19/2016   AFib ablation by Dr Rayann Heman  . GLAUCOMA SURGERY    . TEE WITHOUT CARDIOVERSION N/A 01/19/2016   Procedure: TRANSESOPHAGEAL ECHOCARDIOGRAM (TEE);  Surgeon: Thayer Headings, MD;  Location: [place];  Service: Cardiovascular;  Laterality: N/A;  . TONSILLECTOMY  age 57    ROS- all systems are reviewed and negatives except as per HPI above  Current Outpatient Prescriptions  Medication Sig Dispense Refill  . alendronate (FOSAMAX) 70 MG tablet Take 70 mg by mouth every 7 (seven) days. Take with a full glass of water on an empty stomach.    Marland Kitchen atorvastatin (LIPITOR) 80 MG tablet TAKE 1 TABLET BY MOUTH EVERY DAY 30 tablet 10  . Calcium-Vitamin D 600-200 MG-UNIT per tablet Take 1 tablet by mouth daily.     . Cholecalciferol (VITAMIN D-3) 5000 UNITS TABS Take 1 tablet by mouth daily.     . ferrous sulfate 325 (65 FE) MG tablet Take 325 mg by mouth daily with breakfast.    . FIBER PO Take 2 tablets by mouth daily.     Marland Kitchen gabapentin (NEURONTIN) 100 MG capsule TAKE ONE CAPSULE BY MOUTH IN THE MORNING ,1 TABLET MID AFTERNOON & 2 TABLETS AT BEDTIME 120 capsule 6  .  hydrochlorothiazide (HYDRODIURIL) 25 MG tablet Take 1 tablet (25 mg total) by mouth 3 (three) times a week. 36 tablet 3  . metoprolol succinate (TOPROL-XL) 25 MG 24 hr tablet Take 25 mg by mouth daily.     Marland Kitchen omeprazole (PRILOSEC) 20 MG capsule Take 1 capsule (20 mg total) by mouth daily. 90 capsule 3  . potassium chloride (K-DUR,KLOR-CON) 10 MEQ tablet Take 1 tablet (10 mEq total) by mouth 3 (three) times a week. 15 tablet 6  . potassium chloride (KLOR-CON 10) 10 MEQ tablet Take 1 tablet (10 mEq total) by mouth 3 (three) times a week. 31 tablet 3  . predniSONE (DELTASONE) 1 MG tablet Take 3 mg by mouth daily with breakfast.     . rivaroxaban (XARELTO) 20 MG TABS tablet  Take 1 tablet (20 mg total) by mouth daily with supper. 90 tablet 2  . valsartan (DIOVAN) 320 MG tablet Take 1 tablet (320 mg total) by mouth daily. 90 tablet 1  . Coenzyme Q10 (CO Q 10 PO) Take 1 tablet by mouth daily.     No current facility-administered medications for this encounter.     Physical Exam: Vitals:   12/09/16 1334  BP: 124/68  Pulse: 80  Weight: 121 lb (54.9 kg)  Height: 5\' 2"  (1.575 m)    GEN- The patient is well appearing, alert and oriented x 3 today.   Head- normocephalic, atraumatic Eyes-  Sclera clear, conjunctiva pink Ears- hearing intact Oropharynx- clear Lungs- Clear to ausculation bilaterally, normal work of breathing Heart- Regular rate and rhythm, no murmurs, rubs or gallops, PMI not laterally displaced GI- soft, NT, ND, + BS Extremities- no clubbing, cyanosis, or edema  ekg today reveals normal sinus rhythm, normal ekg  Assessment and Plan:  1. afib Doing reasonably well off AAD therapy Continue xarelto for chad2vasc score of 4 I think that before we proceed with additional treatments, we need to documented the source of her symptoms. We discussed AliveCor Jodelle Red) today.  She will purchase this and document episodes when they occur. ILR is also an option Echo (last echo 2/17) If she is found to have afib, we will consider repeat ablation.  2. HTN Stable No change required today  Follow-up with me in 3 months  Thompson Grayer MD, [facility] 12/09/2016 2:16 PM

## 2016-12-09 NOTE — Patient Instructions (Signed)
Your physician wants you to follow-up in: 3 months with Dr Allred You will receive a reminder letter in the mail two months in advance. If you don't receive a letter, please call our office to schedule the follow-up appointment.  

## 2016-12-09 NOTE — Telephone Encounter (Signed)
Patient scheduled to follow up with Roderic Palau, NP at the A-fib clinic on 2/9.  She will most likely need follow up with Dr. Caryl Comes in the future.

## 2016-12-09 NOTE — Progress Notes (Signed)
error 

## 2016-12-09 NOTE — Telephone Encounter (Signed)
FYI.. Patient states that she sees Dr. Rayann Heman at the [facility] now. Thanks.

## 2016-12-16 DIAGNOSIS — G4733 Obstructive sleep apnea (adult) (pediatric): Secondary | ICD-10-CM | POA: Diagnosis not present

## 2016-12-19 ENCOUNTER — Ambulatory Visit (HOSPITAL_COMMUNITY)
Admission: RE | Admit: 2016-12-19 | Discharge: 2016-12-19 | Disposition: A | Discharge status: Home / Self Care | Payer: PPO | Source: Ambulatory Visit | Attending: Internal Medicine | Admitting: Internal Medicine

## 2016-12-19 DIAGNOSIS — I4891 Unspecified atrial fibrillation: Secondary | ICD-10-CM

## 2016-12-19 DIAGNOSIS — I340 Nonrheumatic mitral (valve) insufficiency: Secondary | ICD-10-CM | POA: Diagnosis not present

## 2016-12-19 NOTE — Progress Notes (Signed)
  Echocardiogram 2D Echocardiogram has been performed.  Lorraine Little L Androw 12/19/2016, 2:47 PM

## 2016-12-20 ENCOUNTER — Other Ambulatory Visit (HOSPITAL_COMMUNITY): Payer: Self-pay | Admitting: Nurse Practitioner

## 2016-12-28 ENCOUNTER — Ambulatory Visit (INDEPENDENT_AMBULATORY_CARE_PROVIDER_SITE_OTHER): Payer: PPO | Admitting: Internal Medicine

## 2016-12-28 VITALS — BP 128/78 | HR 79 | Ht 62.0 in | Wt 120.5 lb

## 2016-12-28 DIAGNOSIS — I480 Paroxysmal atrial fibrillation: Secondary | ICD-10-CM

## 2016-12-28 DIAGNOSIS — I10 Essential (primary) hypertension: Secondary | ICD-10-CM

## 2016-12-28 MED ORDER — METOPROLOL SUCCINATE ER 50 MG PO TB24: 50.0000 mg | ORAL_TABLET | Freq: Every day | ORAL | 3 refills | Status: DC

## 2016-12-28 NOTE — Patient Instructions (Signed)
Medication Instructions:  Your physician has recommended you make the following change in your medication:  1) Increase Toprol to 50 mg daily   Labwork: None ordered   Testing/Procedures: None ordered   Follow-Up: Your physician recommends that you schedule a follow-up appointment in: 6 weeks with Dr Rayann Heman   Any Other Special Instructions Will Be Listed Below (If Applicable).     If you need a refill on your cardiac medications before your next appointment, please call your pharmacy.

## 2016-12-28 NOTE — Progress Notes (Signed)
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  She continues to have palpitations.  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  Today, she denies symptoms of chest pain, shortness of breath,  lower extremity edema, dizziness, presyncope, or syncope.  The patient is otherwise without complaint today.   Past Medical History:  Diagnosis Date  . Adrenal insufficiency (HCC)    takes prednisone 5mg  daily  . Cervical disc disease   . CVA (cerebral infarction)    a. 11/2011  . Depression   . Diverticulosis   . GERD (gastroesophageal reflux disease)    pt denies symptoms with this  . Glaucoma   . Hyperlipidemia    a. Diagnosed 12/2011  . Hypertension   . Insomnia with sleep apnea   . Internal hemorrhoid   . Migraine headache   . Osteoarthritis   . Osteoporosis   . Persistent atrial fibrillation ([place])    a.  Diagnosed 07/2011;  b. Normal nuclear myoview 07/2011 and normal EF at that time;  c. Most recent echo 2/20 /13 - EF 55-60% wit mild-mod MR.;  d. s/p DCCV 01/12/2012  . Polymyalgia rheumatica ([place])    a. On chronic steroids  . Tubular adenoma of colon 04/2007   Past Surgical History:  Procedure Laterality Date  . BREAST LUMPECTOMY  1980's   right  x2 (benign)  . CARDIOVERSION  01/12/2012   Procedure: CARDIOVERSION;  Surgeon: Lelon Perla, MD;  Location: [place];  Service: Cardiovascular;  Laterality: N/A;  . CARDIOVERSION  03/28/2012   Procedure: CARDIOVERSION;  Surgeon: Larey Dresser, MD;  Location: [place];  Service: Cardiovascular;  Laterality: N/A;  . CARDIOVERSION  06/29/2012   Procedure: CARDIOVERSION;  Surgeon: Darlin Coco, MD;  Location: [facility] ENDOSCOPY;  Service: Cardiovascular;  Laterality: N/A;  . CARDIOVERSION N/A  12/07/2015   Procedure: CARDIOVERSION;  Surgeon: Josue Hector, MD;  Location: [facility] ENDOSCOPY;  Service: Cardiovascular;  Laterality: N/A;  . ELECTROPHYSIOLOGIC STUDY N/A 01/19/2016   AFib ablation by Dr Rayann Heman  . GLAUCOMA SURGERY    . TEE WITHOUT CARDIOVERSION N/A 01/19/2016   Procedure: TRANSESOPHAGEAL ECHOCARDIOGRAM (TEE);  Surgeon: Thayer Headings, MD;  Location: [place];  Service: Cardiovascular;  Laterality: N/A;  . TONSILLECTOMY  age 74    ROS- all systems are reviewed and negatives except as per HPI above  Current Outpatient Prescriptions  Medication Sig Dispense Refill  . alendronate (FOSAMAX) 70 MG tablet Take 70 mg by mouth every 7 (seven) days. Take with a full glass of water on an empty stomach.    Marland Kitchen atorvastatin (LIPITOR) 80 MG tablet TAKE 1 TABLET BY MOUTH EVERY DAY 30 tablet 10  . Calcium-Vitamin D 600-200 MG-UNIT per tablet Take 1 tablet by mouth daily.     . Cholecalciferol (VITAMIN D-3) 5000 UNITS TABS Take 1 tablet by mouth daily.     . Coenzyme Q10 (CO Q 10 PO) Take 1 tablet by mouth daily.    . ferrous sulfate 325 (65 FE) MG tablet Take 325 mg by mouth daily with breakfast.    . FIBER PO Take 2 tablets by mouth daily.     Marland Kitchen gabapentin (NEURONTIN) 100 MG capsule TAKE ONE CAPSULE BY MOUTH IN THE MORNING ,1 TABLET  MID AFTERNOON & 2 TABLETS AT BEDTIME 120 capsule 6  . hydrochlorothiazide (HYDRODIURIL) 25 MG tablet Take 1 tablet (25 mg total) by mouth 3 (three) times a week. 36 tablet 3  . metoprolol succinate (TOPROL-XL) 25 MG 24 hr tablet Take 25 mg by mouth daily.     Marland Kitchen omeprazole (PRILOSEC) 20 MG capsule Take 1 capsule (20 mg total) by mouth daily. 90 capsule 3  . potassium chloride (K-DUR,KLOR-CON) 10 MEQ tablet Take 1 tablet (10 mEq total) by mouth 3 (three) times a week. 15 tablet 6  . potassium chloride (KLOR-CON 10) 10 MEQ tablet Take 1 tablet (10 mEq total) by mouth 3 (three) times a week. 31 tablet 3  . predniSONE (DELTASONE) 1 MG tablet Take 3 mg by mouth daily  with breakfast.     . rivaroxaban (XARELTO) 20 MG TABS tablet Take 1 tablet (20 mg total) by mouth daily with supper. 90 tablet 2  . valsartan (DIOVAN) 320 MG tablet TAKE 1 TABLET (320 MG TOTAL) BY MOUTH DAILY. 90 tablet 1   No current facility-administered medications for this visit.     Physical Exam: Vitals:   12/28/16 1600  BP: 128/78  Pulse: 79  SpO2: 95%  Weight: 120 lb 8 oz (54.7 kg)  Height: 5\' 2"  (1.575 m)    GEN- The patient is well appearing, alert and oriented x 3 today.   Head- normocephalic, atraumatic Eyes-  Sclera clear, conjunctiva pink Ears- hearing intact Oropharynx- clear Lungs- Clear to ausculation bilaterally, normal work of breathing Heart- Regular rate and rhythm, no murmurs, rubs or gallops, PMI not laterally displaced GI- soft, NT, ND, + BS Extremities- no clubbing, cyanosis, or edema   AliveCor is personally reviewed today  Assessment and Plan:  1. 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 Continue xarelto for chad2vasc score of 4 Therapeutic strategies for afib including medicine and ablation were discussed in detail with the patient today. Risk, benefits, and alternatives to EP study and radiofrequency ablation for afib were also discussed in detail today.  At this time, she is not ready to agree to ablation.  She wishes to increase her beta blocker and follow conservatively.   I have increased toprol to 50mg  daily. She may want to consider multaq if her arrhythmia continues.  2. HTN Stable No change required today  Follow-up with me in 6 weeks  Today, I have spent  25 minutes with the patient discussing afib management.  More than 50% of the visit time today was spent on this issue.    Thompson Grayer MD, [facility] 12/28/2016

## 2017-01-02 NOTE — Addendum Note (Signed)
Addended by: Della Goo C on: 01/02/2017 07:57 AM   Modules accepted: Orders

## 2017-01-08 ENCOUNTER — Other Ambulatory Visit (HOSPITAL_COMMUNITY): Payer: Self-pay | Admitting: Nurse Practitioner

## 2017-01-09 ENCOUNTER — Other Ambulatory Visit (HOSPITAL_COMMUNITY): Payer: Self-pay | Admitting: *Deleted

## 2017-01-09 MED ORDER — METOPROLOL SUCCINATE ER 50 MG PO TB24: 50.0000 mg | ORAL_TABLET | Freq: Every day | ORAL | 3 refills | Status: DC

## 2017-01-30 ENCOUNTER — Other Ambulatory Visit (HOSPITAL_COMMUNITY): Payer: Self-pay | Admitting: *Deleted

## 2017-01-30 DIAGNOSIS — I4819 Other persistent atrial fibrillation: Secondary | ICD-10-CM

## 2017-01-30 NOTE — Progress Notes (Addendum)
error 

## 2017-01-31 ENCOUNTER — Encounter (HOSPITAL_COMMUNITY): Payer: Self-pay | Admitting: *Deleted

## 2017-02-02 ENCOUNTER — Encounter: Payer: Self-pay | Admitting: Internal Medicine

## 2017-02-06 ENCOUNTER — Other Ambulatory Visit: Payer: Self-pay | Admitting: *Deleted

## 2017-02-06 ENCOUNTER — Other Ambulatory Visit: Payer: PPO

## 2017-02-06 DIAGNOSIS — I480 Paroxysmal atrial fibrillation: Secondary | ICD-10-CM | POA: Diagnosis not present

## 2017-02-07 LAB — CBC WITH DIFFERENTIAL/PLATELET
Basophils Absolute: 0 10*3/uL (ref 0.0–0.2)
Basos: 1 %
EOS (ABSOLUTE): 0 10*3/uL (ref 0.0–0.4)
EOS: 1 %
HEMATOCRIT: 37.9 % (ref 34.0–46.6)
Hemoglobin: 12.6 g/dL (ref 11.1–15.9)
Immature Grans (Abs): 0 10*3/uL (ref 0.0–0.1)
Immature Granulocytes: 1 %
LYMPHS ABS: 0.9 10*3/uL (ref 0.7–3.1)
Lymphs: 14 %
MCH: 30.6 pg (ref 26.6–33.0)
MCHC: 33.2 g/dL (ref 31.5–35.7)
MCV: 92 fL (ref 79–97)
MONOS ABS: 0.8 10*3/uL (ref 0.1–0.9)
Monocytes: 12 %
Neutrophils Absolute: 4.7 10*3/uL (ref 1.4–7.0)
Neutrophils: 71 %
Platelets: 229 10*3/uL (ref 150–379)
RBC: 4.12 x10E6/uL (ref 3.77–5.28)
RDW: 14 % (ref 12.3–15.4)
WBC: 6.5 10*3/uL (ref 3.4–10.8)

## 2017-02-07 LAB — BASIC METABOLIC PANEL
BUN/Creatinine Ratio: 21 (ref 12–28)
BUN: 16 mg/dL (ref 8–27)
CO2: 30 mmol/L — ABNORMAL HIGH (ref 18–29)
CREATININE: 0.77 mg/dL (ref 0.57–1.00)
Calcium: 9.4 mg/dL (ref 8.7–10.3)
Chloride: 100 mmol/L (ref 96–106)
GFR calc Af Amer: 86 mL/min/{1.73_m2} (ref >59)
GFR calc non Af Amer: 75 mL/min/{1.73_m2} (ref >59)
GLUCOSE: 88 mg/dL (ref 65–99)
Potassium: 4.4 mmol/L (ref 3.5–5.2)
SODIUM: 142 mmol/L (ref 134–144)

## 2017-02-08 ENCOUNTER — Ambulatory Visit: Payer: PPO | Admitting: Internal Medicine

## 2017-02-09 ENCOUNTER — Ambulatory Visit (HOSPITAL_COMMUNITY): Payer: PPO

## 2017-02-09 ENCOUNTER — Encounter (HOSPITAL_COMMUNITY): Payer: Self-pay

## 2017-02-09 ENCOUNTER — Telehealth: Payer: Self-pay | Admitting: Internal Medicine

## 2017-02-09 ENCOUNTER — Ambulatory Visit (HOSPITAL_COMMUNITY): Admission: RE | Admit: 2017-02-09 | Payer: PPO | Source: Ambulatory Visit

## 2017-02-09 ENCOUNTER — Ambulatory Visit (HOSPITAL_COMMUNITY)
Admission: RE | Admit: 2017-02-09 | Discharge: 2017-02-09 | Disposition: A | Discharge status: Home / Self Care | Payer: PPO | Source: Ambulatory Visit | Attending: Internal Medicine | Admitting: Internal Medicine

## 2017-02-09 DIAGNOSIS — I4891 Unspecified atrial fibrillation: Secondary | ICD-10-CM | POA: Diagnosis not present

## 2017-02-09 DIAGNOSIS — I517 Cardiomegaly: Secondary | ICD-10-CM | POA: Insufficient documentation

## 2017-02-09 DIAGNOSIS — I4819 Other persistent atrial fibrillation: Secondary | ICD-10-CM

## 2017-02-09 DIAGNOSIS — I481 Persistent atrial fibrillation: Secondary | ICD-10-CM | POA: Diagnosis not present

## 2017-02-09 MED ORDER — IOPAMIDOL (ISOVUE-370) INJECTION 76%
INTRAVENOUS | Status: AC
  Administered 2017-02-09: 80 mL
  Filled 2017-02-09: qty 100

## 2017-02-09 MED ORDER — METOPROLOL TARTRATE 5 MG/5ML IV SOLN
INTRAVENOUS | Status: AC
  Filled 2017-02-09: qty 5

## 2017-02-09 MED ORDER — METOPROLOL TARTRATE 5 MG/5ML IV SOLN
5.0000 mg | Freq: Once | INTRAVENOUS | Status: AC
  Administered 2017-02-09: 5 mg via INTRAVENOUS
  Filled 2017-02-09: qty 5

## 2017-02-09 NOTE — Progress Notes (Signed)
CT scan completed. Tolerated well. D/C home walking by herself. Awake and alert. In no distress.

## 2017-02-09 NOTE — Telephone Encounter (Signed)
Will route this request to Dr. Rayann Heman and  covering RN for further review and follow-up with [facility].

## 2017-02-09 NOTE — Telephone Encounter (Signed)
New message    Florina Ou from scheduling is calling stating that pt is scheduled this morning. She said Dr. Rayann Heman needs to sign the order.

## 2017-02-15 DIAGNOSIS — I10 Essential (primary) hypertension: Secondary | ICD-10-CM | POA: Diagnosis not present

## 2017-02-15 DIAGNOSIS — N390 Urinary tract infection, site not specified: Secondary | ICD-10-CM | POA: Diagnosis not present

## 2017-02-15 DIAGNOSIS — R8299 Other abnormal findings in urine: Secondary | ICD-10-CM | POA: Diagnosis not present

## 2017-02-15 DIAGNOSIS — R946 Abnormal results of thyroid function studies: Secondary | ICD-10-CM | POA: Diagnosis not present

## 2017-02-15 DIAGNOSIS — M810 Age-related osteoporosis without current pathological fracture: Secondary | ICD-10-CM | POA: Diagnosis not present

## 2017-02-15 DIAGNOSIS — E784 Other hyperlipidemia: Secondary | ICD-10-CM | POA: Diagnosis not present

## 2017-02-16 ENCOUNTER — Ambulatory Visit (HOSPITAL_COMMUNITY): Payer: PPO | Admitting: Certified Registered"

## 2017-02-16 ENCOUNTER — Encounter (HOSPITAL_COMMUNITY): Payer: Self-pay | Admitting: Certified Registered"

## 2017-02-16 ENCOUNTER — Ambulatory Visit (HOSPITAL_COMMUNITY)
Admission: RE | Admit: 2017-02-16 | Discharge: 2017-02-17 | Disposition: A | Discharge status: Home / Self Care | Payer: PPO | Source: Ambulatory Visit | Attending: Internal Medicine | Admitting: Internal Medicine

## 2017-02-16 ENCOUNTER — Encounter (HOSPITAL_COMMUNITY)
Admission: RE | Disposition: A | Discharge status: Home / Self Care | Payer: Self-pay | Source: Ambulatory Visit | Attending: Internal Medicine

## 2017-02-16 DIAGNOSIS — F329 Major depressive disorder, single episode, unspecified: Secondary | ICD-10-CM | POA: Insufficient documentation

## 2017-02-16 DIAGNOSIS — M353 Polymyalgia rheumatica: Secondary | ICD-10-CM | POA: Insufficient documentation

## 2017-02-16 DIAGNOSIS — Z8673 Personal history of transient ischemic attack (TIA), and cerebral infarction without residual deficits: Secondary | ICD-10-CM | POA: Diagnosis not present

## 2017-02-16 DIAGNOSIS — K219 Gastro-esophageal reflux disease without esophagitis: Secondary | ICD-10-CM | POA: Insufficient documentation

## 2017-02-16 DIAGNOSIS — Z7952 Long term (current) use of systemic steroids: Secondary | ICD-10-CM | POA: Diagnosis not present

## 2017-02-16 DIAGNOSIS — I10 Essential (primary) hypertension: Secondary | ICD-10-CM | POA: Insufficient documentation

## 2017-02-16 DIAGNOSIS — G4700 Insomnia, unspecified: Secondary | ICD-10-CM | POA: Diagnosis not present

## 2017-02-16 DIAGNOSIS — G43909 Migraine, unspecified, not intractable, without status migrainosus: Secondary | ICD-10-CM | POA: Insufficient documentation

## 2017-02-16 DIAGNOSIS — E785 Hyperlipidemia, unspecified: Secondary | ICD-10-CM | POA: Diagnosis not present

## 2017-02-16 DIAGNOSIS — I471 Supraventricular tachycardia: Secondary | ICD-10-CM | POA: Diagnosis not present

## 2017-02-16 DIAGNOSIS — M1990 Unspecified osteoarthritis, unspecified site: Secondary | ICD-10-CM | POA: Diagnosis not present

## 2017-02-16 DIAGNOSIS — R51 Headache: Secondary | ICD-10-CM | POA: Diagnosis not present

## 2017-02-16 DIAGNOSIS — I481 Persistent atrial fibrillation: Secondary | ICD-10-CM | POA: Insufficient documentation

## 2017-02-16 DIAGNOSIS — G4730 Sleep apnea, unspecified: Secondary | ICD-10-CM | POA: Diagnosis not present

## 2017-02-16 DIAGNOSIS — Z7901 Long term (current) use of anticoagulants: Secondary | ICD-10-CM | POA: Insufficient documentation

## 2017-02-16 DIAGNOSIS — E2740 Unspecified adrenocortical insufficiency: Secondary | ICD-10-CM | POA: Diagnosis not present

## 2017-02-16 DIAGNOSIS — M810 Age-related osteoporosis without current pathological fracture: Secondary | ICD-10-CM | POA: Diagnosis not present

## 2017-02-16 DIAGNOSIS — I4891 Unspecified atrial fibrillation: Secondary | ICD-10-CM | POA: Diagnosis not present

## 2017-02-16 DIAGNOSIS — I472 Ventricular tachycardia: Secondary | ICD-10-CM | POA: Diagnosis not present

## 2017-02-16 DIAGNOSIS — I739 Peripheral vascular disease, unspecified: Secondary | ICD-10-CM | POA: Diagnosis not present

## 2017-02-16 HISTORY — PX: ATRIAL FIBRILLATION ABLATION: EP1191

## 2017-02-16 LAB — POCT ACTIVATED CLOTTING TIME
ACTIVATED CLOTTING TIME: 175 s
Activated Clotting Time: 351 seconds
Activated Clotting Time: 313 seconds

## 2017-02-16 SURGERY — ATRIAL FIBRILLATION ABLATION
Anesthesia: General

## 2017-02-16 MED ORDER — HEPARIN SODIUM (PORCINE) 1000 UNIT/ML IJ SOLN
INTRAMUSCULAR | Status: DC | PRN
  Administered 2017-02-16: 12000 [IU] via INTRAVENOUS
  Administered 2017-02-16: 1000 [IU] via INTRAVENOUS

## 2017-02-16 MED ORDER — HEPARIN SODIUM (PORCINE) 1000 UNIT/ML IJ SOLN
INTRAMUSCULAR | Status: DC | PRN
  Administered 2017-02-16: 1000 [IU] via INTRAVENOUS

## 2017-02-16 MED ORDER — MIDAZOLAM HCL 5 MG/5ML IJ SOLN
INTRAMUSCULAR | Status: DC | PRN
  Administered 2017-02-16: 1 mg via INTRAVENOUS

## 2017-02-16 MED ORDER — AZELAIC ACID 15 % EX GEL: 1.0000 "application " | Freq: Every day | CUTANEOUS | Status: DC

## 2017-02-16 MED ORDER — SODIUM CHLORIDE 0.9% FLUSH
3.0000 mL | Freq: Two times a day (BID) | INTRAVENOUS | Status: DC
  Administered 2017-02-16 – 2017-02-17 (×3): 3 mL via INTRAVENOUS

## 2017-02-16 MED ORDER — IOPAMIDOL (ISOVUE-370) INJECTION 76%
INTRAVENOUS | Status: AC
  Filled 2017-02-16: qty 50

## 2017-02-16 MED ORDER — OFF THE BEAT BOOK
Freq: Once | Status: AC
  Administered 2017-02-16: 12:00:00
  Filled 2017-02-16: qty 1

## 2017-02-16 MED ORDER — LACTATED RINGERS IV SOLN
INTRAVENOUS | Status: DC | PRN
Start: 2017-02-16 — End: 2017-02-16

## 2017-02-16 MED ORDER — BUPIVACAINE HCL (PF) 0.25 % IJ SOLN
INTRAMUSCULAR | Status: AC
  Filled 2017-02-16: qty 30

## 2017-02-16 MED ORDER — ONDANSETRON HCL 4 MG/2ML IJ SOLN
4.0000 mg | Freq: Four times a day (QID) | INTRAMUSCULAR | Status: DC | PRN
Start: 2017-02-16 — End: 2017-02-17

## 2017-02-16 MED ORDER — HEPARIN SODIUM (PORCINE) 1000 UNIT/ML IJ SOLN
INTRAMUSCULAR | Status: AC
  Filled 2017-02-16: qty 1

## 2017-02-16 MED ORDER — PHENYLEPHRINE HCL 10 MG/ML IJ SOLN
INTRAMUSCULAR | Status: DC | PRN
  Administered 2017-02-16: 15 ug/min via INTRAVENOUS

## 2017-02-16 MED ORDER — IOPAMIDOL (ISOVUE-370) INJECTION 76%
INTRAVENOUS | Status: DC | PRN
  Administered 2017-02-16: 3 mL via INTRAVENOUS

## 2017-02-16 MED ORDER — SODIUM CHLORIDE 0.9 % IV SOLN
INTRAVENOUS | Status: DC | PRN
  Administered 2017-02-16: 08:00:00 via INTRAVENOUS

## 2017-02-16 MED ORDER — METOPROLOL SUCCINATE ER 50 MG PO TB24
50.0000 mg | ORAL_TABLET | Freq: Every day | ORAL | Status: DC
  Administered 2017-02-17: 50 mg via ORAL
  Filled 2017-02-16: qty 1

## 2017-02-16 MED ORDER — ISOPROTERENOL HCL 0.2 MG/ML IJ SOLN
INTRAMUSCULAR | Status: DC | PRN
  Administered 2017-02-16: 4 ug/min via INTRAVENOUS

## 2017-02-16 MED ORDER — PROPOFOL 500 MG/50ML IV EMUL
INTRAVENOUS | Status: DC | PRN
  Administered 2017-02-16: 75 ug/kg/min via INTRAVENOUS

## 2017-02-16 MED ORDER — SODIUM CHLORIDE 0.9% FLUSH: 3.0000 mL | INTRAVENOUS | Status: DC | PRN

## 2017-02-16 MED ORDER — BUPIVACAINE HCL (PF) 0.25 % IJ SOLN
INTRAMUSCULAR | Status: DC | PRN
  Administered 2017-02-16: 20 mL

## 2017-02-16 MED ORDER — SODIUM CHLORIDE 0.9 % IV SOLN
250.0000 mL | INTRAVENOUS | Status: DC | PRN
Start: 2017-02-16 — End: 2017-02-17

## 2017-02-16 MED ORDER — ISOPROTERENOL HCL 0.2 MG/ML IJ SOLN
INTRAMUSCULAR | Status: AC
  Filled 2017-02-16: qty 5

## 2017-02-16 MED ORDER — RIVAROXABAN 20 MG PO TABS
20.0000 mg | ORAL_TABLET | Freq: Every day | ORAL | Status: DC
  Administered 2017-02-16: 18:00:00 20 mg via ORAL
  Filled 2017-02-16: qty 1

## 2017-02-16 MED ORDER — PROTAMINE SULFATE 10 MG/ML IV SOLN
INTRAVENOUS | Status: DC | PRN
  Administered 2017-02-16: 30 mg via INTRAVENOUS

## 2017-02-16 MED ORDER — PREDNISONE 1 MG PO TABS
3.0000 mg | ORAL_TABLET | Freq: Every day | ORAL | Status: DC
  Administered 2017-02-17: 3 mg via ORAL
  Filled 2017-02-16 (×2): qty 3

## 2017-02-16 MED ORDER — FENTANYL CITRATE (PF) 100 MCG/2ML IJ SOLN
INTRAMUSCULAR | Status: DC | PRN
  Administered 2017-02-16 (×2): 25 ug via INTRAVENOUS

## 2017-02-16 MED ORDER — CO Q-10 300 MG PO CAPS: 300.0000 mg | ORAL_CAPSULE | Freq: Every day | ORAL | Status: DC

## 2017-02-16 MED ORDER — ACETAMINOPHEN 325 MG PO TABS: 650.0000 mg | ORAL_TABLET | ORAL | Status: DC | PRN

## 2017-02-16 MED ORDER — HYDROCODONE-ACETAMINOPHEN 5-325 MG PO TABS: 1.0000 | ORAL_TABLET | ORAL | Status: DC | PRN

## 2017-02-16 SURGICAL SUPPLY — 19 items
BAG SNAP BAND KOVER 36X36 (MISCELLANEOUS) ×3 IMPLANT
BLANKET WARM UNDERBOD FULL ACC (MISCELLANEOUS) ×3 IMPLANT
CATH JOSEPHSON QUAD-ALLRED 6FR (CATHETERS) ×2 IMPLANT
CATH NAVISTAR SMARTTOUCH DF (ABLATOR) ×2 IMPLANT
CATH SOUNDSTAR 3D IMAGING (CATHETERS) ×2 IMPLANT
CATH VARIABLE LASSO NAV 2515 (CATHETERS) ×2 IMPLANT
CATH WEBSTER BI DIR CS D-F CRV (CATHETERS) ×2 IMPLANT
COVER SWIFTLINK CONNECTOR (BAG) ×5 IMPLANT
NDL TRANSEP BRK 71CM 407200 (NEEDLE) IMPLANT
NEEDLE TRANSEP BRK 71CM 407200 (NEEDLE) ×3 IMPLANT
PACK EP LATEX FREE (CUSTOM PROCEDURE TRAY) ×3
PACK EP LF (CUSTOM PROCEDURE TRAY) ×1 IMPLANT
PAD DEFIB LIFELINK (PAD) ×3 IMPLANT
PATCH CARTO3 (PAD) ×2 IMPLANT
SHEATH AVANTI 11F 11CM (SHEATH) ×2 IMPLANT
SHEATH PINNACLE 7F 10CM (SHEATH) ×4 IMPLANT
SHEATH PINNACLE 9F 10CM (SHEATH) ×2 IMPLANT
SHEATH SWARTZ TS SL2 63CM 8.5F (SHEATH) ×2 IMPLANT
TUBING SMART ABLATE COOLFLOW (TUBING) ×2 IMPLANT

## 2017-02-16 NOTE — Discharge Instructions (Addendum)
No driving for 4 days. No lifting over 5 lbs for 1 week. No vigorous or sexual activity for 1 week. You may return to work on 02/23/17. Keep procedure site clean & dry. If you notice increased pain, swelling, bleeding or pus, call/return!  You may shower, but no soaking baths/hot tubs/pools for 1 week.    You have an appointment set up with the [facility].  Multiple studies have shown that being followed by a dedicated atrial fibrillation clinic in addition to the standard care you receive from your other physicians improves health. We believe that enrollment in the atrial fibrillation clinic will allow [place] to better care for you.   The phone number to the [facility] is [phone]. The clinic is staffed Monday through Friday from 8:30am to 5pm.  Parking Directions: The clinic is located in the Heart and Vascular Building connected to [facility]. 1)From [address] turn on to [address] and go to the 3rd entrance  (Heart and Vascular entrance) on the right. 2)Look to the right for Heart &Vascular Parking Garage. 3)A code for the entrance is required please call the clinic to receive this.   4)Take the elevators to the 1st floor. Registration is in the room with the glass walls at the end of the hallway.  If you have any trouble parking or locating the clinic, please dont hesitate to call [phone].   Information on my medicine - XARELTO (Rivaroxaban)  Why was Xarelto prescribed for you? Xarelto was prescribed for you to reduce the risk of a blood clot forming that can cause a stroke if you have a medical condition called atrial fibrillation (a type of irregular heartbeat).  What do you need to know about xarelto ? Take your Xarelto ONCE DAILY at the same time every day with your evening meal. If you have difficulty swallowing the tablet whole, you may crush it and mix in applesauce just prior to taking your dose.  Take Xarelto exactly  as prescribed by your doctor and DO NOT stop taking Xarelto without talking to the doctor who prescribed the medication.  Stopping without other stroke prevention medication to take the place of Xarelto may increase your risk of developing a clot that causes a stroke.  Refill your prescription before you run out.  After discharge, you should have regular check-up appointments with your healthcare provider that is prescribing your Xarelto.  In the future your dose may need to be changed if your kidney function or weight changes by a significant amount.  What do you do if you miss a dose? If you are taking Xarelto ONCE DAILY and you miss a dose, take it as soon as you remember on the same day then continue your regularly scheduled once daily regimen the next day. Do not take two doses of Xarelto at the same time or on the same day.   Important Safety Information A possible side effect of Xarelto is bleeding. You should call your healthcare provider right away if you experience any of the following: ? Bleeding from an injury or your nose that does not stop. ? Unusual colored urine (red or dark brown) or unusual colored stools (red or black). ? Unusual bruising for unknown reasons. ? A serious fall or if you hit your head (even if there is no bleeding).  Some medicines may interact with Xarelto and might increase your risk of bleeding while on Xarelto. To help avoid this, consult your healthcare provider or pharmacist  prior to using any new prescription or non-prescription medications, including herbals, vitamins, non-steroidal anti-inflammatory drugs (NSAIDs) and supplements.  This website has more information on Xarelto: [email or web address]-benson.[email or web address]

## 2017-02-16 NOTE — Care Management Note (Signed)
Case Management Note  Patient Details  Name: Lorraine Little MRN: [id number] Date of Birth: 12-Nov-1940  Subjective/Objective:  S/P  Atrial Fibrillation Ablation ,  NCM will follow for dc needs.                  Action/Plan:   Expected Discharge Date:                  Expected Discharge Plan:     In-House Referral:     Discharge planning Services  CM Consult  Post Acute Care Choice:    Choice offered to:     DME Arranged:    DME Agency:     HH Arranged:    HH Agency:     Status of Service:  In process, will continue to follow  If discussed at Long Length of Stay Meetings, dates discussed:    Additional Comments:  Zenon Mayo, RN 02/16/2017, 2:42 PM

## 2017-02-16 NOTE — Progress Notes (Signed)
ELECTROPHYSIOLOGY PROCEDURE DISCHARGE SUMMARY    Patient ID: Lorraine Little,  MRN: [id number], DOB/AGE: 1941/04/10 76 y.o.  Admit date: 02/16/2017 Discharge date: 02/17/17  Primary Care Physician: Jerlyn Ly, MD  Primary Cardiologist/Electrophysiologist: Dr. Caryl Comes  Primary Discharge Diagnosis:  1. Paroxysmal AFib 2. 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 PAC's/PVC's  CHA2DS2Vasc is at least 5, on xarelto  Secondary Discharge Diagnosis:  1. HTN 2. Old CVA 3. PMR     Chronic steroid use  Procedures This Admission:  1.  Electrophysiology study and radiofrequency catheter ablation on 02/16/17 by Dr Thompson Grayer.   This study demonstrated    CONCLUSIONS: 1. Sinus rhythm upon presentation.   2. Intracardiac echo reveals a moderately enlarged left atrium with four separate pulmonary veins without evidence of pulmonary vein stenosis. 3. All four pulmonary veins were quiescent from the prior ablation.  There was a small area of tissue along the inferior and posterior edge of the left inferior pulmonary vein which required ablation today.  No other ablation was required for the PVs today.  4. The posterior wall of the left atrium was also quiescent from the prior ablation procedure. 5. Easily inducible ectopic atrial tachycardia mapped and ablated long the mitral valve annulus at the 10 oclock position.  Tachycardia no longer inducible post ablation 6. Additional ablation was performed along the left atrial septal from the mitral valve annulus to the RSPV 7. Two additional ectopic atrial tachycardias were observed with catheter manipulation but were not sustained and thus not amenable to mapping or ablation today 8. No early apparent complications.  Brief HPI: Lorraine Little is a 76 y.o. female with a history of paroxysmal AFib as well as an ectopic atrial tachycardias.  She failed medical therapy with betablockers. Risks, benefits, and alternatives to catheter ablation of atrial fibrillation were  reviewed with the patient who wished to proceed.  The patient underwent cardiac CT prior to the procedure which demonstrated no LAA thrombus.    Hospital Course:  The patient was admitted and underwent EPS/RFCA of atrial fibrillation, atrial tachycardia with details as outlined above.  She was monitored on telemetry overnight which demonstrated SR.  Groin site was without complication on the day of discharge.  The patient was examined by Dr. Rayann Heman and considered to be stable for discharge.  Wound care and restrictions were reviewed with the patient.  The patient will be seen back by Roderic Palau, NP in 4 weeks and Dr Rayann Heman in 12 weeks for post ablation follow up.     Physical Exam: Vitals:   02/16/17 2100 02/16/17 2200 02/17/17 0301 02/17/17 0800  BP: (!) 103/49 (!) 106/40 (!) 138/58 (!) 108/93  Pulse:   85 89  Resp: 15 20 18 20   Temp:   98 F (36.7 C) 98.2 F (36.8 C)  TempSrc:   Axillary Oral  SpO2: 97% 97% 99% 100%  Weight:   125 lb (56.7 kg)   Height:        GEN- The patient is well appearing, alert and oriented x 3 today.   HEENT: normocephalic, atraumatic; sclera clear, conjunctiva pink; hearing intact; oropharynx clear; neck supple  Lungs- CTA b/l, normal work of breathing.  No wheezes, rales, rhonchi Heart- RRR, no murmurs, rubs or gallops  GI- soft, non-tender, non-distended  Extremities- no clubbing, cyanosis, or edema; DP/PT/radial pulses 2+ bilaterally, groin without hematoma/bruit MS- no significant deformity or atrophy Skin- warm and dry, no rash or lesion Psych- euthymic mood, full affect Neuro- strength and sensation  are intact   Labs:   Lab Results  Component Value Date   WBC 6.5 02/06/2017   HGB 12.8 06/23/2016   HCT 37.9 02/06/2017   MCV 92 02/06/2017   PLT 229 02/06/2017   No results for input(s): NA, K, CL, CO2, BUN, CREATININE, CALCIUM, PROT, BILITOT, ALKPHOS, ALT, AST, GLUCOSE in the last 168 hours.  Invalid input(s): LABALBU   Discharge  Medications:  Allergies as of 02/17/2017   No Known Allergies     Medication List    TAKE these medications   atorvastatin 80 MG tablet Commonly known as:  LIPITOR TAKE 1 TABLET BY MOUTH EVERY DAY   Co Q-10 300 MG Caps Take 300 mg by mouth at bedtime.   ferrous sulfate 325 (65 FE) MG tablet Take 325 mg by mouth at bedtime.   FIBER PO Take 2 tablets by mouth at bedtime. Chewable   FINACEA 15 % cream Generic drug:  Azelaic Acid Apply 1 application topically at bedtime. After skin is thoroughly washed and patted dry, gently but thoroughly massage a thin film of azelaic acid cream into the affected area twice daily, in the morning and evening.   gabapentin 100 MG capsule Commonly known as:  NEURONTIN TAKE ONE CAPSULE BY MOUTH IN THE MORNING ,1 TABLET MID AFTERNOON & 2 TABLETS AT BEDTIME   hydrochlorothiazide 25 MG tablet Commonly known as:  HYDRODIURIL Take 1 tablet (25 mg total) by mouth 3 (three) times a week. What changed:  additional instructions   metoprolol succinate 50 MG 24 hr tablet Commonly known as:  TOPROL-XL Take 1 tablet (50 mg total) by mouth daily. What changed:  when to take this  additional instructions   omeprazole 20 MG capsule Commonly known as:  PRILOSEC Take 1 capsule (20 mg total) by mouth daily.   potassium chloride 10 MEQ tablet Commonly known as:  K-DUR,KLOR-CON Take 1 tablet (10 mEq total) by mouth 3 (three) times a week.   potassium chloride 10 MEQ tablet Commonly known as:  KLOR-CON 10 Take 1 tablet (10 mEq total) by mouth 3 (three) times a week. What changed:  additional instructions   predniSONE 1 MG tablet Commonly known as:  DELTASONE Take 3 mg by mouth daily with breakfast.   PROBIOTIC DAILY PO Take 1 capsule by mouth daily.   REFRESH OP Place 1 drop into both eyes daily as needed (dry eyes).   rivaroxaban 20 MG Tabs tablet Commonly known as:  XARELTO Take 1 tablet (20 mg total) by mouth daily with supper.   sodium  chloride 0.65 % Soln nasal spray Commonly known as:  OCEAN Place 1 spray into both nostrils as needed for congestion.   valsartan 320 MG tablet Commonly known as:  DIOVAN TAKE 1 TABLET (320 MG TOTAL) BY MOUTH DAILY.   Vitamin D-3 5000 units Tabs Take 1 tablet by mouth daily.       Disposition:  Home  Follow-up Information    [facility] Follow up on 03/23/2017.   Specialty:  Cardiology Why:  2:00PM Contact information: [address] [id number] mc [place] [phone]       Thompson Grayer, MD Follow up on 05/22/2017.   Specialty:  Cardiology Why:  4:15PM Contact information: [place] [address] [phone]           Duration of Discharge Encounter: Greater than 30 minutes including physician time.  Venetia Night, PA-C 02/17/2017 9:32 AM  I have seen, examined the patient, and reviewed the above assessment and plan.  On exam, RRR.  Doing well.  Changes to above are made where necessary.  DC to home.  Resume home medicines.  Routine post afib ablation management.  Co Sign: Thompson Grayer, MD 02/17/2017 9:32 AM

## 2017-02-16 NOTE — Anesthesia Preprocedure Evaluation (Addendum)
Anesthesia Evaluation  Patient identified by MRN, date of birth, ID band Patient awake    Reviewed: Allergy & Precautions, NPO status , Patient's Chart, lab work & pertinent test results  Airway Mallampati: II  TM Distance: >3 FB     Dental  (+) Teeth Intact, Dental Advisory Given   Pulmonary sleep apnea ,    breath sounds clear to auscultation       Cardiovascular hypertension, + Peripheral Vascular Disease   Rhythm:Regular Rate:Normal  History noted. CG   Neuro/Psych  Headaches, CVA, No Residual Symptoms    GI/Hepatic Neg liver ROS, GERD  ,  Endo/Other  negative endocrine ROS  Renal/GU negative Renal ROS     Musculoskeletal   Abdominal   Peds  Hematology   Anesthesia Other Findings   Reproductive/Obstetrics                            Anesthesia Physical Anesthesia Plan  ASA: III  Anesthesia Plan: General   Post-op Pain Management:    Induction:   Airway Management Planned: LMA  Additional Equipment:   Intra-op Plan:   Post-operative Plan: Extubation in OR  Informed Consent: I have reviewed the patients History and Physical, chart, labs and discussed the procedure including the risks, benefits and alternatives for the proposed anesthesia with the patient or authorized representative who has indicated his/her understanding and acceptance.   Dental advisory given  Plan Discussed with: CRNA and Anesthesiologist  Anesthesia Plan Comments:        Anesthesia Quick Evaluation

## 2017-02-16 NOTE — Progress Notes (Signed)
Site area: Right groin a 7,9,11 french venous sheath was removed  Site Prior to Removal:  Level 0  Pressure Applied For 20 MINUTES    Bedrest Beginning at 1125am  Manual:   Yes.    Patient Status During Pull:  stable  Post Pull Groin Site:  Level 0  Post Pull Instructions Given:  Yes.    Post Pull Pulses Present:  Yes.    Dressing Applied:  Yes.    Comments:  VS remain stable during sheath pull.

## 2017-02-16 NOTE — Anesthesia Postprocedure Evaluation (Signed)
Anesthesia Post Note  Patient: Lorraine Little  Procedure(s) Performed: Procedure(s) (LRB): Atrial Fibrillation Ablation (N/A)  Patient location during evaluation: Endoscopy Anesthesia Type: General Level of consciousness: awake Pain management: pain level controlled Respiratory status: spontaneous breathing Cardiovascular status: stable Anesthetic complications: no       Last Vitals:  Vitals:   02/16/17 1125 02/16/17 1200  BP: 124/66   Pulse: 76 75  Resp: 15   Temp:  36.7 C    Last Pain:  Vitals:   02/16/17 1200  TempSrc: Oral                 Thersa Mohiuddin

## 2017-02-16 NOTE — Transfer of Care (Signed)
Immediate Anesthesia Transfer of Care Note  Patient: Lorraine Little  Procedure(s) Performed: Procedure(s): Atrial Fibrillation Ablation (N/A)  Patient Location: Cath Lab  Anesthesia Type:MAC  Level of Consciousness: awake, alert  and oriented  Airway & Oxygen Therapy: Patient Spontanous Breathing and Patient connected to nasal cannula oxygen  Post-op Assessment: Report given to RN, Post -op Vital signs reviewed and stable and Patient moving all extremities X 4  Post vital signs: Reviewed and stable  Last Vitals:  Vitals:   02/16/17 0553  BP: 136/74  Pulse: 79  Resp: 18  Temp: 36.9 C    Last Pain:  Vitals:   02/16/17 0553  TempSrc: Oral         Complications: No apparent anesthesia complications

## 2017-02-16 NOTE — H&P (Signed)
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 female who presents today for ablation of afib and SVT.  She continues to have palpitations. 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  Today, she denies symptoms of chest pain, shortness of breath,  lower extremity edema, dizziness, presyncope, or syncope.  The patient is otherwise without complaint today.       Past Medical History:  Diagnosis Date  . Adrenal insufficiency (HCC)    takes prednisone 5mg  daily  . Cervical disc disease   . CVA (cerebral infarction)    a. 11/2011  . Depression   . Diverticulosis   . GERD (gastroesophageal reflux disease)    pt denies symptoms with this  . Glaucoma   . Hyperlipidemia    a. Diagnosed 12/2011  . Hypertension   . Insomnia with sleep apnea   . Internal hemorrhoid   . Migraine headache   . Osteoarthritis   . Osteoporosis   . Persistent atrial fibrillation ([place])    a.  Diagnosed 07/2011;  b. Normal nuclear myoview 07/2011 and normal EF at that time;  c. Most recent echo 2/20 /13 - EF 55-60% wit mild-mod MR.;  d. s/p DCCV 01/12/2012  . Polymyalgia rheumatica ([place])    a. On chronic steroids  . Tubular adenoma of colon 04/2007        Past Surgical History:  Procedure Laterality Date  . BREAST LUMPECTOMY  1980's   right  x2 (benign)  . CARDIOVERSION  01/12/2012   Procedure: CARDIOVERSION;  Surgeon: Lelon Perla, MD;  Location: [facility];  Service: Cardiovascular;  Laterality: N/A;  . CARDIOVERSION  03/28/2012   Procedure: CARDIOVERSION;  Surgeon: Larey Dresser, MD;  Location: [facility];  Service: Cardiovascular;  Laterality: N/A;  . CARDIOVERSION  06/29/2012   Procedure: CARDIOVERSION;  Surgeon: Darlin Coco, MD;  Location: [facility] ENDOSCOPY;  Service: Cardiovascular;  Laterality: N/A;  . CARDIOVERSION N/A 12/07/2015    Procedure: CARDIOVERSION;  Surgeon: Josue Hector, MD;  Location: [facility] ENDOSCOPY;  Service: Cardiovascular;  Laterality: N/A;  . ELECTROPHYSIOLOGIC STUDY N/A 01/19/2016   AFib ablation by Dr Rayann Heman  . GLAUCOMA SURGERY    . TEE WITHOUT CARDIOVERSION N/A 01/19/2016   Procedure: TRANSESOPHAGEAL ECHOCARDIOGRAM (TEE);  Surgeon: Thayer Headings, MD;  Location: [facility];  Service: Cardiovascular;  Laterality: N/A;  . TONSILLECTOMY  age 23    ROS- all systems are reviewed and negatives except as per HPI above        Current Outpatient Prescriptions  Medication Sig Dispense Refill  . alendronate (FOSAMAX) 70 MG tablet Take 70 mg by mouth every 7 (seven) days. Take with a full glass of water on an empty stomach.    Marland Kitchen atorvastatin (LIPITOR) 80 MG tablet TAKE 1 TABLET BY MOUTH EVERY DAY 30 tablet 10  . Calcium-Vitamin D 600-200 MG-UNIT per tablet Take 1 tablet by mouth daily.     . Cholecalciferol (VITAMIN D-3) 5000 UNITS TABS Take 1 tablet by mouth daily.     . Coenzyme Q10 (CO Q 10 PO) Take 1 tablet by mouth daily.    . ferrous sulfate 325 (65 FE) MG tablet Take 325 mg by mouth daily with breakfast.    . FIBER PO Take 2 tablets by mouth daily.     Marland Kitchen gabapentin (NEURONTIN) 100 MG capsule TAKE ONE CAPSULE  BY MOUTH IN THE MORNING ,1 TABLET MID AFTERNOON & 2 TABLETS AT BEDTIME 120 capsule 6  . hydrochlorothiazide (HYDRODIURIL) 25 MG tablet Take 1 tablet (25 mg total) by mouth 3 (three) times a week. 36 tablet 3  . metoprolol succinate (TOPROL-XL) 25 MG 24 hr tablet Take 25 mg by mouth daily.     Marland Kitchen omeprazole (PRILOSEC) 20 MG capsule Take 1 capsule (20 mg total) by mouth daily. 90 capsule 3  . potassium chloride (K-DUR,KLOR-CON) 10 MEQ tablet Take 1 tablet (10 mEq total) by mouth 3 (three) times a week. 15 tablet 6  . potassium chloride (KLOR-CON 10) 10 MEQ tablet Take 1 tablet (10 mEq total) by mouth 3 (three) times a week. 31 tablet 3  . predniSONE (DELTASONE) 1 MG tablet Take  3 mg by mouth daily with breakfast.     . rivaroxaban (XARELTO) 20 MG TABS tablet Take 1 tablet (20 mg total) by mouth daily with supper. 90 tablet 2  . valsartan (DIOVAN) 320 MG tablet TAKE 1 TABLET (320 MG TOTAL) BY MOUTH DAILY. 90 tablet 1   No current facility-administered medications for this visit.     Physical Exam:    Vitals:   12/28/16 1600  BP: 128/78  Pulse: 79  SpO2: 95%  Weight: 120 lb 8 oz (54.7 kg)  Height: 5\' 2"  (1.575 m)    GEN- The patient is well appearing, alert and oriented x 3 today.   Head- normocephalic, atraumatic Eyes-  Sclera clear, conjunctiva pink Ears- hearing intact Oropharynx- clear Lungs- Clear to ausculation bilaterally, normal work of breathing Heart- Regular rate and rhythm, no murmurs, rubs or gallops, PMI not laterally displaced GI- soft, NT, ND, + BS Extremities- no clubbing, cyanosis, or edema   AliveCor is personally reviewed today  Assessment and Plan:  1. 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 Continue xarelto for chad2vasc score of 4 Therapeutic strategies for her arrhythmias including medicine and ablation were discussed in detail with the patient today. Risk, benefits, and alternatives to EP study and radiofrequency ablation were also discussed in detail today. These risks include but are not limited to stroke, bleeding, vascular damage, tamponade, perforation, damage to the esophagus, lungs, and other structures, AV block requiring PPM, pulmonary vein stenosis, worsening renal function, and death. The patient understands these risk and wishes to proceed.  Cardiac CT reviewed with patient.  She reports compliance with her xarelto.  Thompson Grayer MD, [facility] 02/16/2017 6:56 AM

## 2017-02-17 DIAGNOSIS — M353 Polymyalgia rheumatica: Secondary | ICD-10-CM | POA: Diagnosis not present

## 2017-02-17 DIAGNOSIS — M1990 Unspecified osteoarthritis, unspecified site: Secondary | ICD-10-CM | POA: Diagnosis not present

## 2017-02-17 DIAGNOSIS — I10 Essential (primary) hypertension: Secondary | ICD-10-CM | POA: Diagnosis not present

## 2017-02-17 DIAGNOSIS — G4700 Insomnia, unspecified: Secondary | ICD-10-CM | POA: Diagnosis not present

## 2017-02-17 DIAGNOSIS — I471 Supraventricular tachycardia: Secondary | ICD-10-CM | POA: Diagnosis not present

## 2017-02-17 DIAGNOSIS — M810 Age-related osteoporosis without current pathological fracture: Secondary | ICD-10-CM | POA: Diagnosis not present

## 2017-02-17 DIAGNOSIS — I481 Persistent atrial fibrillation: Secondary | ICD-10-CM | POA: Diagnosis not present

## 2017-02-17 DIAGNOSIS — F329 Major depressive disorder, single episode, unspecified: Secondary | ICD-10-CM | POA: Diagnosis not present

## 2017-02-17 DIAGNOSIS — I480 Paroxysmal atrial fibrillation: Secondary | ICD-10-CM | POA: Diagnosis not present

## 2017-02-17 DIAGNOSIS — G43909 Migraine, unspecified, not intractable, without status migrainosus: Secondary | ICD-10-CM | POA: Diagnosis not present

## 2017-02-17 DIAGNOSIS — E785 Hyperlipidemia, unspecified: Secondary | ICD-10-CM | POA: Diagnosis not present

## 2017-02-17 DIAGNOSIS — K219 Gastro-esophageal reflux disease without esophagitis: Secondary | ICD-10-CM | POA: Diagnosis not present

## 2017-02-17 DIAGNOSIS — G4730 Sleep apnea, unspecified: Secondary | ICD-10-CM | POA: Diagnosis not present

## 2017-02-17 NOTE — Progress Notes (Signed)
Pt being discharged home via wheelchair with family. Pt alert and oriented x4. VSS. Pt c/o no pain at this time. No signs of respiratory distress. Education complete and care plans resolved. IV removed with catheter intact and pt tolerated well. No further issues at this time. Pt to follow up with PCP. Conlin Brahm R, RN 

## 2017-02-22 DIAGNOSIS — M353 Polymyalgia rheumatica: Secondary | ICD-10-CM | POA: Diagnosis not present

## 2017-02-22 DIAGNOSIS — Z0000 Encounter for general adult medical examination without abnormal findings: Secondary | ICD-10-CM | POA: Diagnosis not present

## 2017-02-22 DIAGNOSIS — R8299 Other abnormal findings in urine: Secondary | ICD-10-CM | POA: Diagnosis not present

## 2017-02-22 DIAGNOSIS — M810 Age-related osteoporosis without current pathological fracture: Secondary | ICD-10-CM | POA: Diagnosis not present

## 2017-02-22 DIAGNOSIS — R3915 Urgency of urination: Secondary | ICD-10-CM | POA: Diagnosis not present

## 2017-02-22 DIAGNOSIS — R358 Other polyuria: Secondary | ICD-10-CM | POA: Diagnosis not present

## 2017-02-22 DIAGNOSIS — Z1389 Encounter for screening for other disorder: Secondary | ICD-10-CM | POA: Diagnosis not present

## 2017-02-22 DIAGNOSIS — D6489 Other specified anemias: Secondary | ICD-10-CM | POA: Diagnosis not present

## 2017-02-22 DIAGNOSIS — I6340 Cerebral infarction due to embolism of unspecified cerebral artery: Secondary | ICD-10-CM | POA: Diagnosis not present

## 2017-02-22 DIAGNOSIS — R159 Full incontinence of feces: Secondary | ICD-10-CM | POA: Diagnosis not present

## 2017-02-22 DIAGNOSIS — I480 Paroxysmal atrial fibrillation: Secondary | ICD-10-CM | POA: Diagnosis not present

## 2017-02-22 DIAGNOSIS — Z6822 Body mass index (BMI) 22.0-22.9, adult: Secondary | ICD-10-CM | POA: Diagnosis not present

## 2017-02-22 DIAGNOSIS — Z1231 Encounter for screening mammogram for malignant neoplasm of breast: Secondary | ICD-10-CM | POA: Diagnosis not present

## 2017-02-22 DIAGNOSIS — I10 Essential (primary) hypertension: Secondary | ICD-10-CM | POA: Diagnosis not present

## 2017-03-09 DIAGNOSIS — Z1231 Encounter for screening mammogram for malignant neoplasm of breast: Secondary | ICD-10-CM | POA: Diagnosis not present

## 2017-03-23 ENCOUNTER — Ambulatory Visit (HOSPITAL_COMMUNITY)
Admission: RE | Admit: 2017-03-23 | Discharge: 2017-03-23 | Disposition: A | Discharge status: Home / Self Care | Payer: PPO | Source: Ambulatory Visit | Attending: Nurse Practitioner | Admitting: Nurse Practitioner

## 2017-03-23 ENCOUNTER — Encounter (HOSPITAL_COMMUNITY): Payer: Self-pay | Admitting: Nurse Practitioner

## 2017-03-23 VITALS — BP 116/64 | HR 84 | Ht 62.5 in | Wt 120.0 lb

## 2017-03-23 DIAGNOSIS — M353 Polymyalgia rheumatica: Secondary | ICD-10-CM | POA: Diagnosis not present

## 2017-03-23 DIAGNOSIS — Z7952 Long term (current) use of systemic steroids: Secondary | ICD-10-CM | POA: Insufficient documentation

## 2017-03-23 DIAGNOSIS — Z8249 Family history of ischemic heart disease and other diseases of the circulatory system: Secondary | ICD-10-CM | POA: Diagnosis not present

## 2017-03-23 DIAGNOSIS — G4730 Sleep apnea, unspecified: Secondary | ICD-10-CM | POA: Diagnosis not present

## 2017-03-23 DIAGNOSIS — Z823 Family history of stroke: Secondary | ICD-10-CM | POA: Insufficient documentation

## 2017-03-23 DIAGNOSIS — F329 Major depressive disorder, single episode, unspecified: Secondary | ICD-10-CM | POA: Insufficient documentation

## 2017-03-23 DIAGNOSIS — M1990 Unspecified osteoarthritis, unspecified site: Secondary | ICD-10-CM | POA: Insufficient documentation

## 2017-03-23 DIAGNOSIS — Z7901 Long term (current) use of anticoagulants: Secondary | ICD-10-CM | POA: Diagnosis not present

## 2017-03-23 DIAGNOSIS — K219 Gastro-esophageal reflux disease without esophagitis: Secondary | ICD-10-CM | POA: Insufficient documentation

## 2017-03-23 DIAGNOSIS — I480 Paroxysmal atrial fibrillation: Secondary | ICD-10-CM | POA: Diagnosis not present

## 2017-03-23 DIAGNOSIS — E2740 Unspecified adrenocortical insufficiency: Secondary | ICD-10-CM | POA: Diagnosis not present

## 2017-03-23 DIAGNOSIS — Z79899 Other long term (current) drug therapy: Secondary | ICD-10-CM | POA: Insufficient documentation

## 2017-03-23 DIAGNOSIS — I481 Persistent atrial fibrillation: Secondary | ICD-10-CM | POA: Diagnosis not present

## 2017-03-23 DIAGNOSIS — I10 Essential (primary) hypertension: Secondary | ICD-10-CM | POA: Diagnosis not present

## 2017-03-23 DIAGNOSIS — G4700 Insomnia, unspecified: Secondary | ICD-10-CM | POA: Diagnosis not present

## 2017-03-23 DIAGNOSIS — Z9889 Other specified postprocedural states: Secondary | ICD-10-CM | POA: Diagnosis not present

## 2017-03-23 DIAGNOSIS — Z8673 Personal history of transient ischemic attack (TIA), and cerebral infarction without residual deficits: Secondary | ICD-10-CM | POA: Insufficient documentation

## 2017-03-23 DIAGNOSIS — E785 Hyperlipidemia, unspecified: Secondary | ICD-10-CM | POA: Insufficient documentation

## 2017-03-23 DIAGNOSIS — Z800 Family history of malignant neoplasm of digestive organs: Secondary | ICD-10-CM | POA: Diagnosis not present

## 2017-03-23 MED ORDER — METOPROLOL SUCCINATE ER 50 MG PO TB24: 25.0000 mg | ORAL_TABLET | Freq: Every day | ORAL | 11 refills | Status: DC

## 2017-03-23 NOTE — Progress Notes (Signed)
Primary Care Physician: Crist Infante, MD Referring Physician: Dr. Caryl Comes EP: Dr. Juanetta Snow Lorraine Little is a 76 y.o. female with a h/o aifb ablation 01/19/16 and repeat ablation 02/16/17 that is in the afib clinic for f/u.She has done very well since the ablation and is pleased that she has not had any recurrent sustained arrhythmia. Denies any swallowing or rt groin issues.  Today, she denies symptoms of palpitations, chest pain, shortness of breath, orthopnea, PND, lower extremity edema, dizziness, presyncope, syncope, or neurologic sequela. The patient is tolerating medications without difficulties and is otherwise without complaint today.   Past Medical History:  Diagnosis Date  . Adrenal insufficiency (HCC)    takes prednisone 5mg  daily  . Cervical disc disease   . CVA (cerebral infarction)    a. 11/2011  . Depression   . Diverticulosis   . GERD (gastroesophageal reflux disease)    pt denies symptoms with this  . Glaucoma   . Hyperlipidemia    a. Diagnosed 12/2011  . Hypertension   . Insomnia with sleep apnea   . Internal hemorrhoid   . Migraine headache   . Osteoarthritis   . Osteoporosis   . Persistent atrial fibrillation ([place])    a.  Diagnosed 07/2011;  b. Normal nuclear myoview 07/2011 and normal EF at that time;  c. Most recent echo 2/20 /13 - EF 55-60% wit mild-mod MR.;  d. s/p DCCV 01/12/2012  . Polymyalgia rheumatica ([place])    a. On chronic steroids  . Tubular adenoma of colon 04/2007   Past Surgical History:  Procedure Laterality Date  . ATRIAL FIBRILLATION ABLATION N/A 02/16/2017   Procedure: Atrial Fibrillation Ablation;  Surgeon: Thompson Grayer, MD;  Location: [place] [facility];  Service: Cardiovascular;  Laterality: N/A;  . BREAST LUMPECTOMY  1980's   right  x2 (benign)  . CARDIOVERSION  01/12/2012   Procedure: CARDIOVERSION;  Surgeon: Lelon Perla, MD;  Location: [place];  Service: Cardiovascular;  Laterality: N/A;  . CARDIOVERSION  03/28/2012   Procedure: CARDIOVERSION;  Surgeon: Larey Dresser, MD;  Location: [place];  Service: Cardiovascular;  Laterality: N/A;  . CARDIOVERSION  06/29/2012   Procedure: CARDIOVERSION;  Surgeon: Darlin Coco, MD;  Location[facility] ENDOSCOPY;  Service: Cardiovascular;  Laterality: N/A;  . CARDIOVERSION N/A 12/07/2015   Procedure: CARDIOVERSION;  Surgeon: Josue Hector, MD;  Location: [facility] ENDOSCOPY;  Service: Cardiovascular;  Laterality: N/A;  . ELECTROPHYSIOLOGIC STUDY N/A 01/19/2016   AFib ablation by Dr Rayann Heman  . GLAUCOMA SURGERY    . TEE WITHOUT CARDIOVERSION N/A 01/19/2016   Procedure: TRANSESOPHAGEAL ECHOCARDIOGRAM (TEE);  Surgeon: Thayer Headings, MD;  Location: [place];  Service: Cardiovascular;  Laterality: N/A;  . TONSILLECTOMY  age 67    Current Outpatient Prescriptions  Medication Sig Dispense Refill  . atorvastatin (LIPITOR) 80 MG tablet TAKE 1 TABLET BY MOUTH EVERY DAY 30 tablet 10  . Azelaic Acid (FINACEA) 15 % cream Apply 1 application topically at bedtime. After skin is thoroughly washed and patted dry, gently but thoroughly massage a thin film of azelaic acid cream into the affected area twice daily, in the morning and evening.    . Cholecalciferol (VITAMIN D-3) 5000 UNITS TABS Take 1 tablet by mouth daily.     . Coenzyme Q10 (CO Q-10) 300 MG CAPS Take 300 mg by mouth at bedtime.    . ferrous sulfate 325 (65 FE) MG tablet Take 325 mg by mouth at bedtime.     Marland Kitchen  FIBER PO Take 2 tablets by mouth at bedtime. Chewable    . gabapentin (NEURONTIN) 100 MG capsule TAKE ONE CAPSULE BY MOUTH IN THE MORNING ,1 TABLET MID AFTERNOON & 2 TABLETS AT BEDTIME (Patient taking differently: TAKE ONE CAPSULE BY MOUTH IN THE MORNING & 2 TABLETS AT BEDTIME) 120 capsule 6  . hydrochlorothiazide (HYDRODIURIL) 25 MG tablet Take 1 tablet (25 mg total) by mouth 3 (three) times a week. (Patient taking differently: Take 25 mg by mouth 3 (three) times a week. Monday, Wednesday and Friday) 36 tablet 3  . metoprolol  succinate (TOPROL-XL) 50 MG 24 hr tablet Take 25 mg by mouth daily. Take with or immediately following a meal.    . omeprazole (PRILOSEC) 20 MG capsule Take 1 capsule (20 mg total) by mouth daily. 90 capsule 3  . Polyvinyl Alcohol-Povidone (REFRESH OP) Place 1 drop into both eyes daily as needed (dry eyes).    . potassium chloride (KLOR-CON 10) 10 MEQ tablet Take 1 tablet (10 mEq total) by mouth 3 (three) times a week. (Patient taking differently: Take 10 mEq by mouth 3 (three) times a week. Monday Wednesday and friday) 31 tablet 3  . predniSONE (DELTASONE) 1 MG tablet Take 3 mg by mouth daily with breakfast.     . Probiotic Product (PROBIOTIC DAILY PO) Take 1 capsule by mouth daily.    . rivaroxaban (XARELTO) 20 MG TABS tablet Take 1 tablet (20 mg total) by mouth daily with supper. 90 tablet 2  . sodium chloride (OCEAN) 0.65 % SOLN nasal spray Place 1 spray into both nostrils as needed for congestion.    . valsartan (DIOVAN) 320 MG tablet TAKE 1 TABLET (320 MG TOTAL) BY MOUTH DAILY. 90 tablet 1   No current facility-administered medications for this encounter.     No Known Allergies  Social History   Social History  . Marital status: Married    Spouse name: [name]  . Number of children: 3  . Years of education: college   Occupational History  . retired      Pharmacist, hospital , [place] History Main Topics  . Smoking status: Never Smoker  . Smokeless tobacco: Never Used  . Alcohol use 0.6 oz/week    1 Glasses of wine per week     Comment: rare  . Drug use: No  . Sexual activity: Not Currently   Other Topics Concern  . Not on file   Social History Narrative   Pt lives in [place] with spouse.  [name])  She was previously a Education officer, museum.    Caffeine- None   Right handed   Patient has three children.   Patient has a college education.    Family History  Problem Relation Age of Onset  . Stroke Mother        Deceased @ 44  . Heart failure Mother        died from  . Liver  cancer Father        Deceased @ 51  . Colon cancer Paternal Grandmother     ROS- All systems are reviewed and negative except as per the HPI above  Physical Exam: Vitals:   03/23/17 1341  BP: 116/64  Pulse: 84  Weight: 120 lb (54.4 kg)  Height: 5' 2.5" (1.588 m)    GEN- The patient is well appearing, alert and oriented x 3 today.   Head- normocephalic, atraumatic Eyes-  Sclera clear, conjunctiva pink Ears- hearing intact Oropharynx- clear Neck- supple, no  JVP Lymph- no cervical lymphadenopathy Lungs- Clear to ausculation bilaterally, normal work of breathing Heart- Regular rate and rhythm, no murmurs, rubs or gallops, PMI not laterally displaced GI- soft, NT, ND, + BS Extremities- no clubbing, cyanosis, or edema MS- no significant deformity or atrophy Skin- no rash or lesion Psych- euthymic mood, full affect Neuro- strength and sensation are intact  EKG- NSR at 84 bpm, pr int 204 ms, qrs int 70 ms, qtc 439 ms   Assessment and Plan: 1. Paroxysmal afib Resolved with last ablation one month ago Feels well Decrease metoprolol xl to 50 mg 1/2 tab a day Continue xarelto with chadsvasc score of at least 4  2.  Hypertension  Stable  3. Afib Quiet In SR s/p ablation in March Off amiodarone  Continue metoprolol daily Continue xarelto with chadsvasc score of at least 4   F/u  with Dr. Rayann Heman as scheduled afib clinic as needed   Butch Penny C. Natilee Gauer, [facility] [address] [place], [place] [address] [phone]

## 2017-05-02 ENCOUNTER — Other Ambulatory Visit: Payer: Self-pay | Admitting: Neurology

## 2017-05-02 IMAGING — CT CT HEART MORPH/PULM VEIN W/ CM & W/O CA SCORE
1 of 5 series · 11 of 20 positions shown, 14 images · non-contrast
Comparison: None.

CLINICAL DATA: Atrial fibrillation scheduled for an ablation.

EXAM:
Cardiac CT/CTA
TECHNIQUE: The patient was scanned on a Siemens Somatom scanner.

[Series 5: coradseq 0.5 i26f 3 60 - 80 % · axial · 0.30mm/px · z∈[+1509,+1653]mm · 11 of 2870 slices shown, 14 images]
[im 240/2870  vessel]
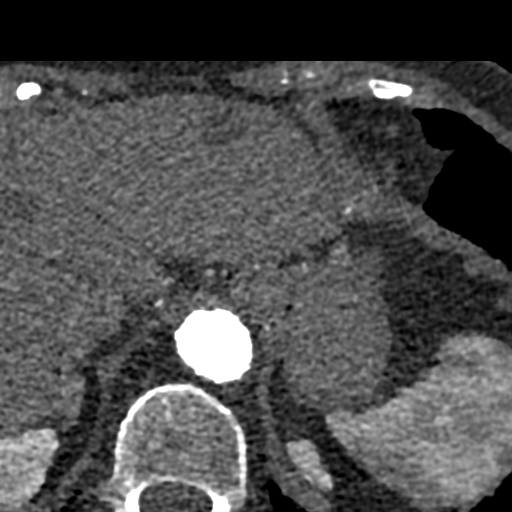
[im 240/2870  lung]
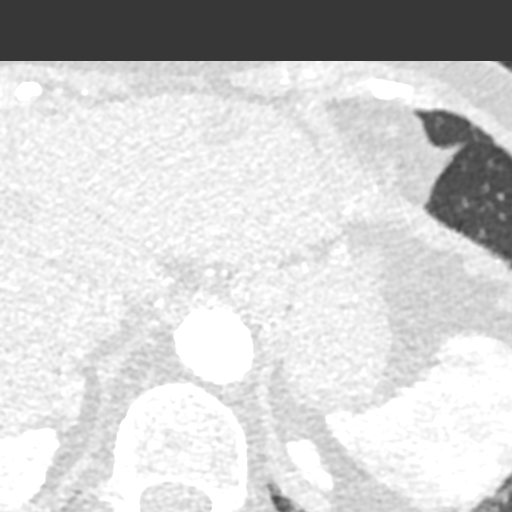
[im 479/2870  vessel]
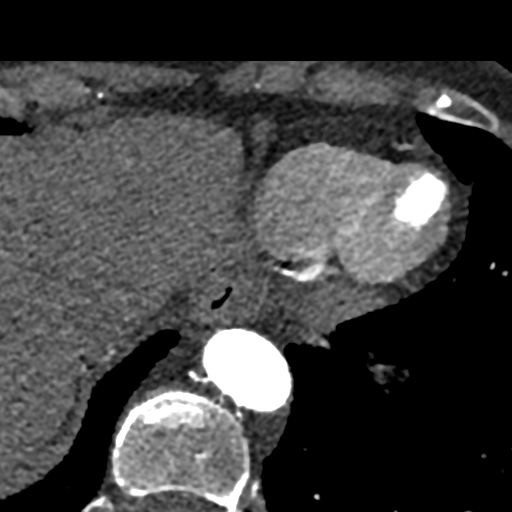
[im 718/2870  vessel]
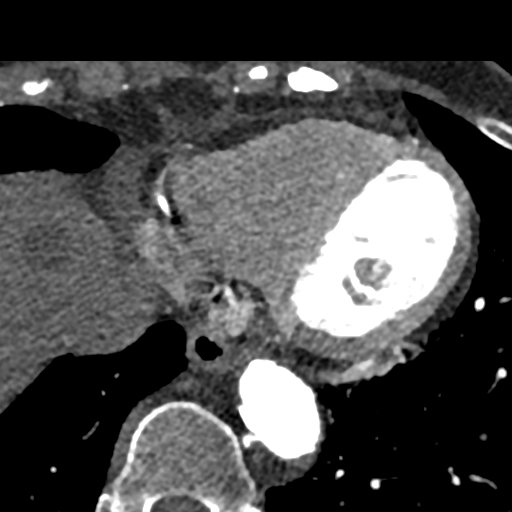
[im 957/2870  vessel]
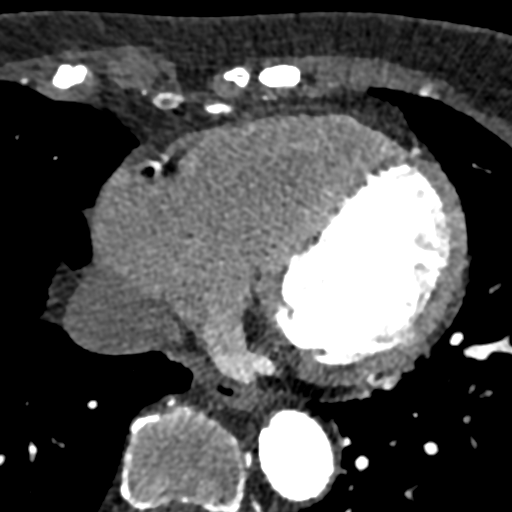
[im 1196/2870  vessel]
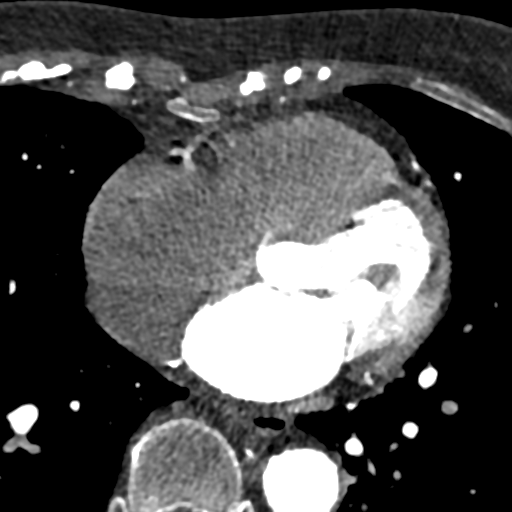
[im 1196/2870  lung]
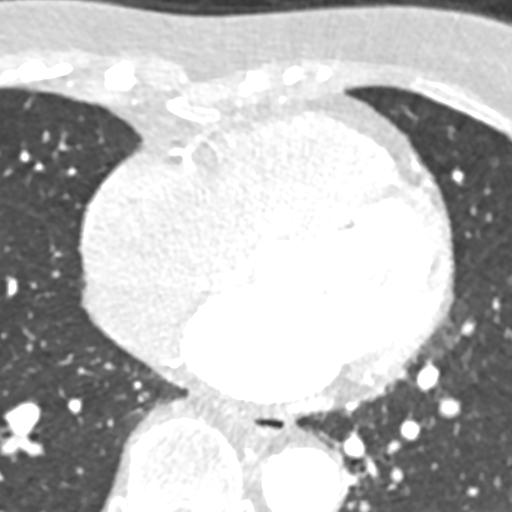
[im 1435/2870  vessel]
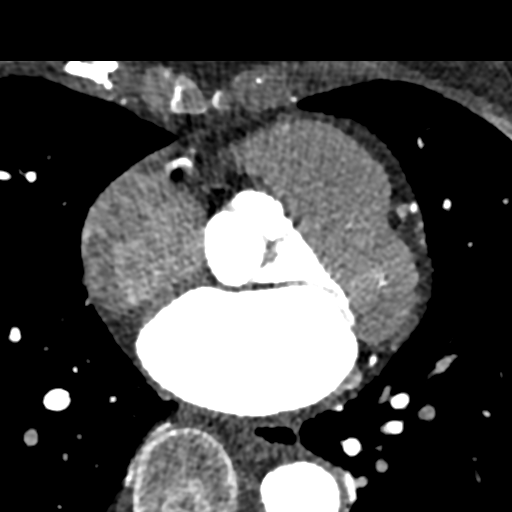
[im 1674/2870  vessel]
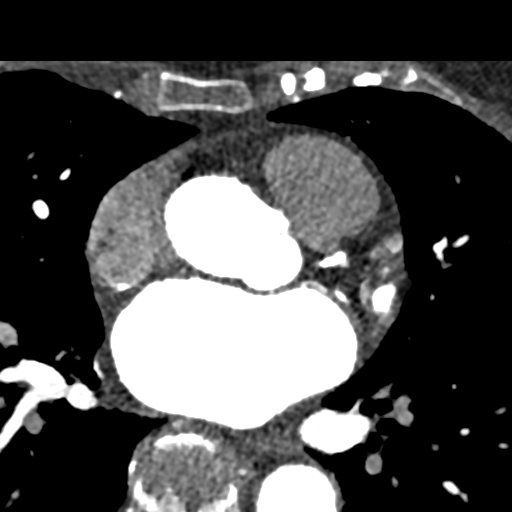
[im 1913/2870  vessel]
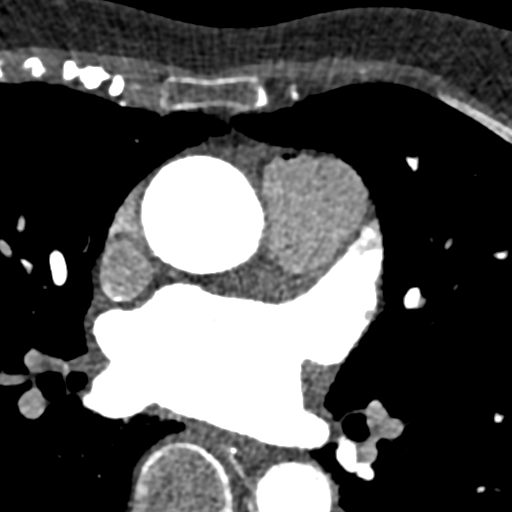
[im 2152/2870  vessel]
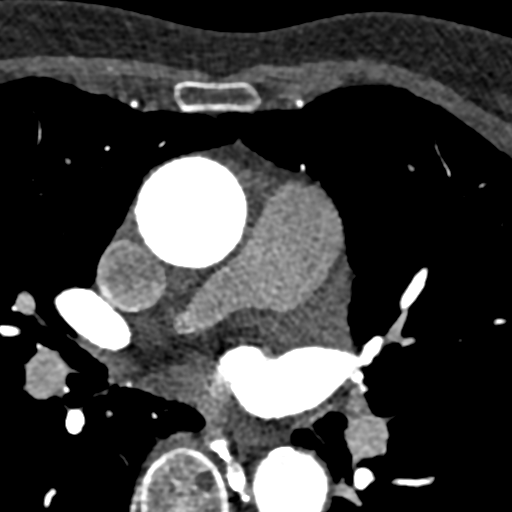
[im 2152/2870  lung]
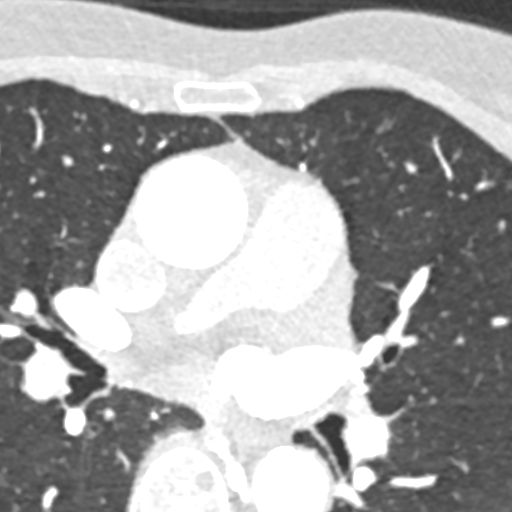
[im 2391/2870  vessel]
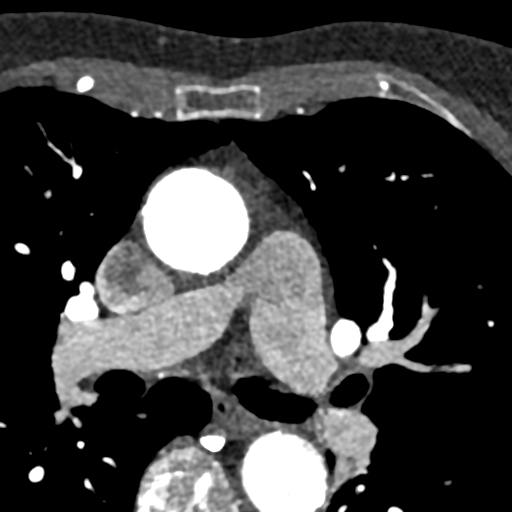
[im 2630/2870  vessel]
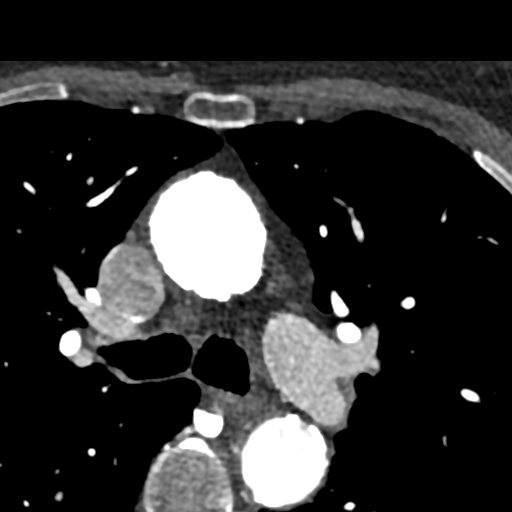

[11 of 20 positions shown; findings below may reference images not displayed]

FINDINGS: A 120 kV prospective scan was triggered in the descending thoracic
aorta at 111 HU's. Gantry rotation speed was 280 msecs and
collimation was .9 mm. No beta blockade and no NTG was given. The 3D
data set was reconstructed in 5% intervals of the 60-80 % of the R-R
cycle. Diastolic phases were analyzed on a dedicated work station
using MPR, MIP and VRT modes. The patient received 80 cc of
contrast.

There was moderate biatrial enlargement. There was no ASD/VSD. No
pericardial effusion The esophagus coursed closest to the LLPV
ostium There was no BASTOLA thrombus

All 4 pulmonary veins drained into the LA with no anomalies

RUPV:  Ostium 20 mm Area 2.3 cm2

RLPV:  Ostium 17 mm Area 2.7 cm2

LUPV:  Ostium 17 mm Area 2.2 cm2

LLPV:  Ostium 15 mm Area 1.9 cm2
IMPRESSION: 1) No BASTOLA thrombus

2) Moderate Biatrial enlargement

3) Normal pulmonary veins with no anomalies measurements as above

4) Esophagus courses closest to LLPV ostium

5) No pericardial effusion

Quirijn Amazigh

EXAM:
OVER-READ INTERPRETATION  CT CHEST

The following report is an over-read performed by radiologist Dr.
Justo Jeanbaptiste [[email or web address]] on 02/09/2017. This over-read
does not include interpretation of cardiac or coronary anatomy or
pathology. The coronary CTA interpretation by the cardiologist is
attached.
FINDINGS: Cardiovascular: Diffuse calcifications throughout the aorta.
Visualized aorta is normal caliber. heart is normal size.

Mediastinum/Nodes: No mediastinal, hilar, or axillary adenopathy.

Lungs/Pleura: Visualized lungs are clear.  No effusions.

Upper Abdomen: Numerous low-density lesions throughout the liver,
likely cysts. No acute findings.

Musculoskeletal: Chest wall soft tissues are unremarkable. No acute
bony abnormality.
IMPRESSION: No acute or significant extracardiac abnormality.

## 2017-05-02 MED ORDER — GABAPENTIN 100 MG PO CAPS: ORAL_CAPSULE | ORAL | 3 refills | Status: DC

## 2017-05-12 ENCOUNTER — Other Ambulatory Visit (HOSPITAL_COMMUNITY): Payer: Self-pay | Admitting: Nurse Practitioner

## 2017-05-22 ENCOUNTER — Encounter: Payer: Self-pay | Admitting: Internal Medicine

## 2017-05-22 ENCOUNTER — Ambulatory Visit (INDEPENDENT_AMBULATORY_CARE_PROVIDER_SITE_OTHER): Payer: PPO | Admitting: Internal Medicine

## 2017-05-22 VITALS — BP 118/70 | HR 86 | Ht 62.5 in | Wt 123.2 lb

## 2017-05-22 DIAGNOSIS — I480 Paroxysmal atrial fibrillation: Secondary | ICD-10-CM | POA: Diagnosis not present

## 2017-05-22 MED ORDER — METOPROLOL SUCCINATE ER 25 MG PO TB24: ORAL_TABLET | ORAL | 0 refills | Status: DC

## 2017-05-22 MED ORDER — LOSARTAN POTASSIUM 100 MG PO TABS: 100.0000 mg | ORAL_TABLET | Freq: Every day | ORAL | 3 refills | Status: DC

## 2017-05-22 NOTE — Patient Instructions (Addendum)
Medication Instructions:  Your physician has recommended you make the following change in your medication:  1) STOP Valsartan 2) START Losartan 100 mg once daily 3) Decrease Metoprolol Succinate to 12.5 mg (1/2 of 25 mg tablet) once daily for 2 weeks then STOP completely   Labwork: None ordered   Testing/Procedures: None ordered   Follow-Up: Your physician wants you to follow-up in: 3 months with Dr. Rayann Heman    Any Other Special Instructions Will Be Listed Below (If Applicable).     If you need a refill on your cardiac medications before your next appointment, please call your pharmacy.

## 2017-05-22 NOTE — Progress Notes (Signed)
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  Since his recent afib ablation, the patient reports doing very well.  she denies procedure related complications and is pleased with the results of the procedure.  Today, she denies symptoms of palpitations, chest pain, shortness of breath,  lower extremity edema, dizziness, presyncope, or syncope.  The patient is otherwise without complaint today.   Past Medical History:  Diagnosis Date  . Adrenal insufficiency (HCC)    takes prednisone 5mg  daily  . Cervical disc disease   . CVA (cerebral infarction)    a. 11/2011  . Depression   . Diverticulosis   . GERD (gastroesophageal reflux disease)    pt denies symptoms with this  . Glaucoma   . Hyperlipidemia    a. Diagnosed 12/2011  . Hypertension   . Insomnia with sleep apnea   . Internal hemorrhoid   . Migraine headache   . Osteoarthritis   . Osteoporosis   . Persistent atrial fibrillation ([place])    a.  Diagnosed 07/2011;  b. Normal nuclear myoview 07/2011 and normal EF at that time;  c. Most recent echo 2/20 /13 - EF 55-60% wit mild-mod MR.;  d. s/p DCCV 01/12/2012  . Polymyalgia rheumatica ([place])    a. On chronic steroids  . Tubular adenoma of colon 04/2007   Past Surgical History:  Procedure Laterality Date  . ATRIAL FIBRILLATION ABLATION N/A 02/16/2017   Procedure: Atrial Fibrillation Ablation;  Surgeon: Thompson Grayer, MD;  Location: [facility];  Service: Cardiovascular;  Laterality: N/A;  . BREAST LUMPECTOMY  1980's   right  x2 (benign)  . CARDIOVERSION  01/12/2012   Procedure: CARDIOVERSION;  Surgeon: Lelon Perla, MD;  Location: [facility];  Service: Cardiovascular;  Laterality: N/A;  . CARDIOVERSION  03/28/2012   Procedure: CARDIOVERSION;  Surgeon: Larey Dresser, MD;  Location: [facility];  Service: Cardiovascular;  Laterality: N/A;  . CARDIOVERSION  06/29/2012   Procedure:  CARDIOVERSION;  Surgeon: Darlin Coco, MD;  Location: [facility] ENDOSCOPY;  Service: Cardiovascular;  Laterality: N/A;  . CARDIOVERSION N/A 12/07/2015   Procedure: CARDIOVERSION;  Surgeon: Josue Hector, MD;  Location: [facility] ENDOSCOPY;  Service: Cardiovascular;  Laterality: N/A;  . ELECTROPHYSIOLOGIC STUDY N/A 01/19/2016   AFib ablation by Dr Rayann Heman  . GLAUCOMA SURGERY    . TEE WITHOUT CARDIOVERSION N/A 01/19/2016   Procedure: TRANSESOPHAGEAL ECHOCARDIOGRAM (TEE);  Surgeon: Thayer Headings, MD;  Location: [facility] ENDOSCOPY;  Service: Cardiovascular;  Laterality: N/A;  . TONSILLECTOMY  age 81    ROS- all systems are personally reviewed and negatives except as per HPI above  Current Outpatient Prescriptions  Medication Sig Dispense Refill  . atorvastatin (LIPITOR) 80 MG tablet TAKE 1 TABLET BY MOUTH EVERY DAY 30 tablet 10  . Azelaic Acid (FINACEA) 15 % cream Apply 1 application topically at bedtime. After skin is thoroughly washed and patted dry, gently but thoroughly massage a thin film of azelaic acid cream into the affected area twice daily, in the morning and evening.    . Cholecalciferol (VITAMIN D-3) 5000 UNITS TABS Take 1 tablet by mouth daily.     . Coenzyme Q10 (CO Q-10) 300 MG CAPS Take 300 mg by mouth at bedtime.    . ferrous sulfate 325 (65 FE) MG tablet Take 325 mg by mouth at bedtime.     Marland Kitchen FIBER PO Take 2 tablets by mouth at bedtime.  Chewable    . gabapentin (NEURONTIN) 100 MG capsule TAKE ONE CAPSULE BY MOUTH IN THE MORNING ,1 TABLET MID AFTERNOON & 2 TABLETS AT BEDTIME 360 capsule 3  . hydrochlorothiazide (HYDRODIURIL) 25 MG tablet Take 1 tablet (25 mg total) by mouth 3 (three) times a week. 36 tablet 0  . metoprolol succinate (TOPROL-XL) 50 MG 24 hr tablet Take 25 mg by mouth daily. Take with or immediately following a meal.    . omeprazole (PRILOSEC) 20 MG capsule Take 1 capsule (20 mg total) by mouth daily. 90 capsule 3  . Polyvinyl Alcohol-Povidone (REFRESH OP) Place 1 drop into both  eyes daily as needed (dry eyes).    . potassium chloride (KLOR-CON 10) 10 MEQ tablet Take 1 tablet (10 mEq total) by mouth 3 (three) times a week. 31 tablet 3  . predniSONE (DELTASONE) 1 MG tablet Take 3 mg by mouth daily with breakfast.     . Probiotic Product (PROBIOTIC DAILY PO) Take 1 capsule by mouth daily.    . rivaroxaban (XARELTO) 20 MG TABS tablet Take 1 tablet (20 mg total) by mouth daily with supper. 90 tablet 2  . sodium chloride (OCEAN) 0.65 % SOLN nasal spray Place 1 spray into both nostrils as needed for congestion (As directed).     . valsartan (DIOVAN) 320 MG tablet TAKE 1 TABLET (320 MG TOTAL) BY MOUTH DAILY. 90 tablet 1   No current facility-administered medications for this visit.     Physical Exam: Vitals:   05/22/17 1613  BP: 118/70  Pulse: 86  SpO2: 97%  Weight: 123 lb 3.2 oz (55.9 kg)  Height: 5' 2.5" (1.588 m)    GEN- The patient is well appearing, alert and oriented x 3 today.   Head- normocephalic, atraumatic Eyes-  Sclera clear, conjunctiva pink Ears- hearing intact Oropharynx- clear Lungs- Clear to ausculation bilaterally, normal work of breathing Heart- Regular rate and rhythm, no murmurs, rubs or gallops, PMI not laterally displaced GI- soft, NT, ND, + BS Extremities- no clubbing, cyanosis, or edema  EKG tracing ordered today is personally reviewed and shows sinus rhythm, normal ekg  Assessment and Plan:  1. paroxysmal atrial fibrillation/ ectopic atrial tachycardia Doing well s/p ablation chads2vasc score is 4 Continue anticoagulation Wean metoprolol to off per patient preference  2. HTN Stable Stop metoprolol as above Switch valsartan to losartan due to recent recalls  Return to see me in 3 months  Thompson Grayer MD, [facility] 05/22/2017 4:34 PM

## 2017-06-08 ENCOUNTER — Other Ambulatory Visit (HOSPITAL_COMMUNITY): Payer: Self-pay | Admitting: Nurse Practitioner

## 2017-06-12 ENCOUNTER — Other Ambulatory Visit (HOSPITAL_COMMUNITY): Payer: Self-pay | Admitting: Nurse Practitioner

## 2017-07-18 ENCOUNTER — Encounter: Payer: Self-pay | Admitting: Neurology

## 2017-07-20 ENCOUNTER — Ambulatory Visit (INDEPENDENT_AMBULATORY_CARE_PROVIDER_SITE_OTHER): Payer: PPO | Admitting: Neurology

## 2017-07-20 ENCOUNTER — Other Ambulatory Visit: Payer: Self-pay | Admitting: Neurology

## 2017-07-20 ENCOUNTER — Encounter: Payer: Self-pay | Admitting: Neurology

## 2017-07-20 VITALS — BP 102/60 | HR 87 | Ht 62.0 in | Wt 118.0 lb

## 2017-07-20 DIAGNOSIS — Z9989 Dependence on other enabling machines and devices: Secondary | ICD-10-CM

## 2017-07-20 DIAGNOSIS — G4733 Obstructive sleep apnea (adult) (pediatric): Secondary | ICD-10-CM

## 2017-07-20 DIAGNOSIS — I480 Paroxysmal atrial fibrillation: Secondary | ICD-10-CM | POA: Diagnosis not present

## 2017-07-20 NOTE — Progress Notes (Signed)
[facility]  Provider:  Larey Seat, M D  Referring Provider: Crist Infante, MD Primary Care Physician:  Crist Infante, MD  Chief Complaint  Patient presents with  . Follow-up    pt alone, rm 11. pt says everything going well. she had a heart abblation in April. pt would like to talk about a different face mask because the one she is useing it irritated and causing bruising on her nose.     HPI:  Lorraine Little is a 76 y.o., caucasian, right handed female.  08-14-14 ; Patient [place] underwent a sleep study on 11-27-13. AHI was 33.1 and her RDI was 33.1/hr. as well. Not a mild case of apnea.The lowest oxygen level was at 78% , 02 saturation was 12.6 minutes total time below 89% saturation. She had irregular heart rate and normal sinus rhythm throughout the diagnostic study. There were no periodic limb movements noted. Based on the high AHI CPAP was initiated and the patient was only titrated to 5 cm with. She still had a very poor sleep efficiency but AHI was reduced to 0. She used a nasal pillow and an ESON  nasal mask.  Download was reviewed today. She brought me today at a download from her machine and it shows that she has used the machine 5 hours and 5 minutes nightly , a 97% compliance over 30 days.  Again,  her main problem now is not the residual apnea but her insomnia. She had trouble sleeping for a long time before OSA was diagnosed and CPAP initiated. The CPAP has helped her headaches! Her problem is sleep initiation, not staying asleep. She has less palpiations. Continues on amiodarone , which can induce insomnia . She is tolerating it well, but had some neuropathic changes.  Belsomra at 10 mg was felt too strong, to groggy making in AM.  I offered her 5 mg today. I would like for her to consider the ablation therapy form atrial fibrillation, and to be able to d/c the amiodarone treatment. She will discuss this step with Dr. Virl Axe , her cardiologist and  electrophysiologist this afternoon.   Revisit from 08-20-15, Lorraine Little is here today and reports that she has not used her CPAP for over 5 months. Her headaches however have not returned. She does not feel it had any impact on her sleep or lack of sleep. She has been treated with gabapentin and she thinks this has successfully controlled her headaches. Her fatigue severity score today is 31 points and her Epworth sleepiness score is 4 points the geriatric depression score is endorsed at one point. Overall she feels well. She never found an comfortable interface without any breakout or rash, and the nasal pillow dislodged frequently. This actually interrupted her sleep and she felt that this she got less sleep on CPAP than without. She effectively discontinued CPAP use. Her cardiologist suggested to use CPAP for better HTN control. She will have to make a decision.   Rv 01-14-16  I agree with Dr Rayann Heman that atrial fibrillations are precipitated by risk factors such as caffeine or stimulant use,  OSA. Her apnea is moderate -severe. She struggles with Insomnia and felt CPAP was contributing or at least not helping. She reported air leaks form nasal interface , irritating her eyes. I will reinitiate CPAP at current pressure.  She endorsed the Epworth sleepiness score at 3 points fatigue severity at 32 points, geriatric depression at one point. Today's download shows an 83% compliance was  24 out of 30 days of over 4 hours of daily use. The patient used to machine 5 hours and 31 minutes a day , on average this is an Auto machine between 5 and 10 cm water pressure was 1 cm EPR, residual AHI is 0.6 and excellent result the 95th percentile pressure is 10 cm water. I will ask her to have an appointment for a daytime PAP Nap. This should reveal the best fitting interface - perhaps a wisp?   She  is seen here as a revisit  from Dr. Joylene Draft for evaluation of sleep apnea with insomnia on CPAP . Dr. Leonie Man has seen this  patient on  [facility] service.     07-19-2016 Lorraine Little an established patient in the [place] and is presenting here today with a complaint of intermittent insomnia. She can not even sleep on vacation [place]. This has been a problem for several years. Her primary care physician [name] has at times prescribed sleep aids,  but felt that it was reasonable to evaluate her sleep further before continuing to prescribe sedatives. The sleep test was positive and the patient was started on CPAP- after initially struggling, she is now compliant and used CPAP with great regularity. The mask took some getting used to. She remains hoarse - see Assessment and Plan. She has seen a specialist at [facility], has a problem at the voice box, a tremor.  Just this Sunday night she reports she had severe palpitations and almost fell that her whole body was moving she couldn't keep still. This was an exception because most nights she is doing very well with the use of her CPAP and has actually some restful sleep. I was able to see today's compliance report dated 18th of September 2017 and she has 100% compliance for days 93 complaint for hours of use for 7 hours and 30 minutes average she is using an AutoSet between 5 and 10 with a residual AHI of 0.5 a pressure requirement at the 91st percentile is 10 cm water. No changes have to be made. She is not using any Ambien or Xanax or any sleep aid ! Good result.  She will see Dr Rayann Heman , follow up from ablation !  I had the pleasure of seeing Lorraine Little today on 07/20/2017, this was meant to be a routine follow-up visit for CPAP compliance but the patient also remarked that she has been back in atrial fibrillation and will see her cardiologist, Dr. Rayann Heman, to plan a new ablation procedure. Lorraine Little has been compliant with her CPAP she has used at 30 out of 30 days was 97 complaint percent compliance by hours. On average use is 7 hours  and 8 minutes at night this is an AutoSet between 5 and 10 cm water was 1 cm EPR, the patient's AHI is 1.3. This is an excellent result The patient noticed palpitations over the last month and more frequently at a time when she was compliant with CPAP use. She does need better follow-up from her durable medical equipment company. 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 He is now with [facility] and if she likes I will refer her there. She would like to try a wisp.     Review of Systems: Out of a complete 14 system review, the patient complains of only the following symptoms, and all other reviewed systems are  negative. Snoring, dry mouth, sore nostrils on pillows,  Bridge of nose pressure. palpitations,Insomnia better controlled on melatonin.    Social History   Social History  . Marital status: Married    Spouse name: [name]  . Number of children: 3  . Years of education: college   Occupational History  . retired      Pharmacist, hospital , [place] History Main Topics  . Smoking status: Never Smoker  . Smokeless tobacco: Never Used  . Alcohol use 0.6 oz/week    1 Glasses of wine per week     Comment: rare  . Drug use: No  . Sexual activity: Not Currently   Other Topics Concern  . Not on file   Social History Narrative   Pt lives in [place] with spouse.  [name])  She was previously a Education officer, museum.    Caffeine- None   Right handed   Patient has three children.   Patient has a college education.    Family History  Problem Relation Age of Onset  . Stroke Mother        Deceased @ 65  . Heart failure Mother        died from  . Liver cancer Father        Deceased @ 63  . Colon cancer Paternal Grandmother      Allergies as of 07/20/2017  . (No Known Allergies)    Vitals: BP 102/60   Pulse 87   Ht 5\' 2"  (1.575 m)   Wt 118 lb (53.5 kg)   BMI 21.58 kg/m  Last Weight:  Wt Readings from Last 1 Encounters:  07/20/17 118 lb (53.5  kg)   Last Height:   Ht Readings from Last 1 Encounters:  07/20/17 5\' 2"  (1.575 m)    Physical exam:  General: The patient is awake, alert and appears not in acute distress. The patient is very well groomed. Head: Normocephalic, atraumatic. Neck is supple. Mallampati 2, neck circumference: 14 inches . No retrognathia.  Cardiovascular:  Regular rate and rhythm , without  murmurs or carotid bruit, and without distended neck veins. Respiratory: Lungs are clear to auscultation. Skin:  Without evidence of edema, Trunk: no scoliosis. Neurologic exam : The patient is awake and alert, oriented to place and time.  Memory subjective  described as intact.  There is a normal attention span & concentration ability. The patient is articulate and cooperative.  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 Mood and affect are anxious. Cranial nerves: Pupils are equal and briskly reactive to light. Facial motor strength is symmetric and tongue and uvula move midline.Motor exam:   Normal tone and normal muscle bulk and symmetric normal strength in all extremities. Coordination: Rapid alternating movements in the fingers/hands is normal. Gait and station: Patient walks without assistive device.Deep tendon reflexes: in the upper and lower extremities are symmetric and intact.    Assessment:  After physical and neurologic examination, review of laboratory studies, imaging, neurophysiology testing and pre-existing records,  25 minute assessment with more than 50% of the face to face time dedicated to discussion of atrial fib overlay with OSA, insomnia now not longer present.     1)  OSA on CPAP treatment  97% compliance .    2) patient had another ablation for atrial fib scheduled, Dr. Rayann Heman or Dr.Klein.       RV in 12 month   Larey Seat, MD

## 2017-07-27 ENCOUNTER — Other Ambulatory Visit: Payer: Self-pay | Admitting: Internal Medicine

## 2017-08-13 ENCOUNTER — Other Ambulatory Visit: Payer: Self-pay | Admitting: Internal Medicine

## 2017-08-15 ENCOUNTER — Other Ambulatory Visit (HOSPITAL_COMMUNITY): Payer: Self-pay | Admitting: Internal Medicine

## 2017-08-24 ENCOUNTER — Telehealth: Payer: Self-pay

## 2017-08-24 DIAGNOSIS — I480 Paroxysmal atrial fibrillation: Secondary | ICD-10-CM

## 2017-08-24 DIAGNOSIS — G4733 Obstructive sleep apnea (adult) (pediatric): Secondary | ICD-10-CM

## 2017-08-24 NOTE — Telephone Encounter (Signed)
Patient states that you had given her a list of supplies that she can get from [facility] and she has misplaced it, could you please send her another list. Thanks.

## 2017-08-25 ENCOUNTER — Ambulatory Visit (INDEPENDENT_AMBULATORY_CARE_PROVIDER_SITE_OTHER): Payer: PPO | Admitting: Internal Medicine

## 2017-08-25 ENCOUNTER — Encounter: Payer: Self-pay | Admitting: Internal Medicine

## 2017-08-25 VITALS — BP 120/64 | HR 85 | Ht 62.0 in | Wt 121.4 lb

## 2017-08-25 DIAGNOSIS — I480 Paroxysmal atrial fibrillation: Secondary | ICD-10-CM | POA: Diagnosis not present

## 2017-08-25 DIAGNOSIS — I10 Essential (primary) hypertension: Secondary | ICD-10-CM

## 2017-08-25 DIAGNOSIS — Z86010 Personal history of colonic polyps: Secondary | ICD-10-CM | POA: Diagnosis not present

## 2017-08-25 DIAGNOSIS — R159 Full incontinence of feces: Secondary | ICD-10-CM | POA: Insufficient documentation

## 2017-08-25 DIAGNOSIS — R8290 Unspecified abnormal findings in urine: Secondary | ICD-10-CM | POA: Diagnosis not present

## 2017-08-25 DIAGNOSIS — K921 Melena: Secondary | ICD-10-CM | POA: Diagnosis not present

## 2017-08-25 DIAGNOSIS — R531 Weakness: Secondary | ICD-10-CM | POA: Diagnosis not present

## 2017-08-25 NOTE — Progress Notes (Signed)
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  Since last being seen in our clinic, the patient reports doing very well. Her primary concern is with chronic back pain.  Today, she denies symptoms of palpitations, chest pain, shortness of breath,  lower extremity edema, dizziness, presyncope, or syncope.  The patient is otherwise without complaint today.   Past Medical History:  Diagnosis Date  . Adrenal insufficiency (HCC)    takes prednisone 5mg  daily  . Cervical disc disease   . CVA (cerebral infarction)    a. 11/2011  . Depression   . Diverticulosis   . GERD (gastroesophageal reflux disease)    pt denies symptoms with this  . Glaucoma   . Hyperlipidemia    a. Diagnosed 12/2011  . Hypertension   . Insomnia with sleep apnea   . Internal hemorrhoid   . Migraine headache   . Osteoarthritis   . Osteoporosis   . Persistent atrial fibrillation ([place])    a.  Diagnosed 07/2011;  b. Normal nuclear myoview 07/2011 and normal EF at that time;  c. Most recent echo 2/20 /13 - EF 55-60% wit mild-mod MR.;  d. s/p DCCV 01/12/2012  . Polymyalgia rheumatica ([place])    a. On chronic steroids  . Tubular adenoma of colon 04/2007   Past Surgical History:  Procedure Laterality Date  . ATRIAL FIBRILLATION ABLATION N/A 02/16/2017   Procedure: Atrial Fibrillation Ablation;  Surgeon: Thompson Grayer, MD;  Location: [place] [facility];  Service: Cardiovascular;  Laterality: N/A;  . BREAST LUMPECTOMY  1980's   right  x2 (benign)  . CARDIOVERSION  01/12/2012   Procedure: CARDIOVERSION;  Surgeon: Lelon Perla, MD;  Location: [place];  Service: Cardiovascular;  Laterality: N/A;  . CARDIOVERSION  03/28/2012   Procedure: CARDIOVERSION;  Surgeon: Larey Dresser, MD;  Location: [place];  Service: Cardiovascular;  Laterality: N/A;  . CARDIOVERSION  06/29/2012   Procedure: CARDIOVERSION;  Surgeon: Darlin Coco, MD;   Location: [facility] ENDOSCOPY;  Service: Cardiovascular;  Laterality: N/A;  . CARDIOVERSION N/A 12/07/2015   Procedure: CARDIOVERSION;  Surgeon: Josue Hector, MD;  Location: [facility] ENDOSCOPY;  Service: Cardiovascular;  Laterality: N/A;  . ELECTROPHYSIOLOGIC STUDY N/A 01/19/2016   AFib ablation by Dr Rayann Heman  . GLAUCOMA SURGERY    . TEE WITHOUT CARDIOVERSION N/A 01/19/2016   Procedure: TRANSESOPHAGEAL ECHOCARDIOGRAM (TEE);  Surgeon: Thayer Headings, MD;  Location: [place];  Service: Cardiovascular;  Laterality: N/A;  . TONSILLECTOMY  age 11    ROS- all systems are reviewed and negatives except as per HPI above  Current Outpatient Prescriptions  Medication Sig Dispense Refill  . atorvastatin (LIPITOR) 80 MG tablet TAKE 1 TABLET BY MOUTH EVERY DAY 30 tablet 9  . Azelaic Acid (FINACEA) 15 % cream Apply 1 application topically at bedtime. After skin is thoroughly washed and patted dry, gently but thoroughly massage a thin film of azelaic acid cream into the affected area twice daily, in the morning and evening.    . Cholecalciferol (VITAMIN D-3) 5000 UNITS TABS Take 1 tablet by mouth daily.     . Coenzyme Q10 (CO Q-10) 300 MG CAPS Take 300 mg by mouth at bedtime.    Marland Kitchen FERROUS SULFATE PO Take 165 mg by mouth at bedtime.     Marland Kitchen FIBER PO Take 2 tablets by mouth at bedtime. Chewable    . gabapentin (NEURONTIN) 100 MG capsule  TAKE ONE CAPSULE BY MOUTH IN THE MORNING ,1 TABLET MID AFTERNOON & 2 TABLETS AT BEDTIME 360 capsule 3  . hydrochlorothiazide (HYDRODIURIL) 25 MG tablet TAKE 1 TABLET BY MOUTH 3 TIMES A WEEK 45 tablet 2  . losartan (COZAAR) 100 MG tablet Take 1 tablet (100 mg total) by mouth daily. 90 tablet 3  . metoprolol succinate (TOPROL-XL) 25 MG 24 hr tablet Take 1/2 tablet by mouth as needed for afib heart rate over 100 30 tablet 1  . omeprazole (PRILOSEC) 20 MG capsule Take 1 capsule (20 mg total) by mouth daily. 90 capsule 3  . Polyvinyl Alcohol-Povidone (REFRESH OP) Place 1 drop into both eyes  daily as needed (dry eyes).    . potassium chloride (KLOR-CON 10) 10 MEQ tablet Take 1 tablet (10 mEq total) by mouth 3 (three) times a week. 31 tablet 3  . predniSONE (DELTASONE) 1 MG tablet Take 3 mg by mouth daily with breakfast.     . Probiotic Product (PROBIOTIC DAILY PO) Take 1 capsule by mouth daily.    . sodium chloride (OCEAN) 0.65 % SOLN nasal spray Place 1 spray into both nostrils as needed for congestion (As directed).     Alveda Reasons 20 MG TABS tablet TAKE 1 TABLET BY MOUTH DAILY WITH SUPPER 90 tablet 0   No current facility-administered medications for this visit.     Physical Exam: Vitals:   08/25/17 1456  BP: 120/64  Pulse: 85  SpO2: 99%  Weight: 121 lb 6.4 oz (55.1 kg)  Height: 5\' 2"  (1.575 m)    GEN- The patient is well appearing, alert and oriented x 3 today.   Head- normocephalic, atraumatic Eyes-  Sclera clear, conjunctiva pink Ears- hearing intact Oropharynx- clear Lungs- Clear to ausculation bilaterally, normal work of breathing Heart- Regular rate and rhythm, no murmurs, rubs or gallops, PMI not laterally displaced GI- soft, NT, ND, + BS Extremities- no clubbing, cyanosis, or edema  EKG tracing ordered today is personally reviewed and shows sinus rhythm 85 bpm, otherwise normal ekg   Assessment and Plan:  1. Paroxysmal atrial fibrillation/ ectopic atrial tachycardia Doing very well post ablation off AAD therapy without recurrence chads2vasc score is 4.  No changes today  2. HTN Stable No change required today   Thompson Grayer MD, [facility] 08/25/2017 3:09 PM

## 2017-08-25 NOTE — Patient Instructions (Signed)
Medication Instructions:  Your physician recommends that you continue on your current medications as directed. Please refer to the Current Medication list given to you today.  -- If you need a refill on your cardiac medications before your next appointment, please call your pharmacy. --  Labwork: None ordered  Testing/Procedures: None ordered  Follow-Up: Your physician wants you to follow-up in: 6 MONTHS with Dr. Rayann Heman.  You will receive a reminder letter in the mail two months in advance. If you don't receive a letter, please call our office to schedule the follow-up appointment.  Thank you for choosing [facility]!!   Frederik Schmidt, RN [phone]  Any Other Special Instructions Will Be Listed Below (If Applicable).

## 2017-08-28 ENCOUNTER — Telehealth: Payer: Self-pay | Admitting: Neurology

## 2017-08-28 NOTE — Telephone Encounter (Signed)
I think we discussed a possible chin strap- and an alternative mask, possible a WISP model- this discussion took place after the visit was closed.   [employer] order will be placed.

## 2017-08-28 NOTE — Addendum Note (Signed)
Addended by: Larey Seat on: 08/28/2017 10:19 AM   Modules accepted: Orders

## 2017-08-28 NOTE — Telephone Encounter (Signed)
Called and spoke with the patient. She states she is still struggling with mask after getting one from Dr Brett Fairy at her office visit. I have instructed the patient to call [employer] and set up an apt. I informed her to take her mask that she has trialed and failed and see if they can fit her for one that may be helpful. Pt verbalized understanding. Pt had no questions at this time but was encouraged to call back if questions arise.

## 2017-08-29 DIAGNOSIS — G4733 Obstructive sleep apnea (adult) (pediatric): Secondary | ICD-10-CM | POA: Diagnosis not present

## 2017-09-26 DIAGNOSIS — H40033 Anatomical narrow angle, bilateral: Secondary | ICD-10-CM | POA: Diagnosis not present

## 2017-09-26 DIAGNOSIS — H2513 Age-related nuclear cataract, bilateral: Secondary | ICD-10-CM | POA: Diagnosis not present

## 2017-10-10 ENCOUNTER — Other Ambulatory Visit: Payer: Self-pay | Admitting: Internal Medicine

## 2017-10-10 ENCOUNTER — Other Ambulatory Visit (HOSPITAL_COMMUNITY): Payer: Self-pay | Admitting: Nurse Practitioner

## 2017-10-18 ENCOUNTER — Telehealth: Payer: Self-pay | Admitting: Internal Medicine

## 2017-10-18 DIAGNOSIS — S92352A Displaced fracture of fifth metatarsal bone, left foot, initial encounter for closed fracture: Secondary | ICD-10-CM | POA: Diagnosis not present

## 2017-10-18 DIAGNOSIS — M79672 Pain in left foot: Secondary | ICD-10-CM | POA: Diagnosis not present

## 2017-10-18 DIAGNOSIS — K581 Irritable bowel syndrome with constipation: Secondary | ICD-10-CM | POA: Diagnosis not present

## 2017-10-18 DIAGNOSIS — R159 Full incontinence of feces: Secondary | ICD-10-CM | POA: Diagnosis not present

## 2017-10-18 NOTE — Telephone Encounter (Signed)
Patient's husband came into the office asking if we can do an X-ray of her ankle. I advised the patient's husband that she would dhave to be an established patient with [facility] in order to do that so we can have orders for the doctor. Patient's husband refused to have her transfer. I also asked if we needed to call the EMS to have her taken to the ER to have the X-ray and he refused. No further action is required.

## 2017-10-19 DIAGNOSIS — S92355A Nondisplaced fracture of fifth metatarsal bone, left foot, initial encounter for closed fracture: Secondary | ICD-10-CM | POA: Diagnosis not present

## 2017-11-06 ENCOUNTER — Other Ambulatory Visit (HOSPITAL_COMMUNITY): Payer: Self-pay | Admitting: *Deleted

## 2017-11-06 MED ORDER — METOPROLOL SUCCINATE ER 25 MG PO TB24: ORAL_TABLET | ORAL | 1 refills | Status: DC

## 2017-11-10 ENCOUNTER — Other Ambulatory Visit (HOSPITAL_COMMUNITY): Payer: Self-pay | Admitting: Nurse Practitioner

## 2017-11-16 DIAGNOSIS — S92355D Nondisplaced fracture of fifth metatarsal bone, left foot, subsequent encounter for fracture with routine healing: Secondary | ICD-10-CM | POA: Diagnosis not present

## 2017-12-14 DIAGNOSIS — S92355D Nondisplaced fracture of fifth metatarsal bone, left foot, subsequent encounter for fracture with routine healing: Secondary | ICD-10-CM | POA: Diagnosis not present

## 2018-01-17 DIAGNOSIS — M6284 Sarcopenia: Secondary | ICD-10-CM | POA: Diagnosis not present

## 2018-01-17 DIAGNOSIS — R159 Full incontinence of feces: Secondary | ICD-10-CM | POA: Diagnosis not present

## 2018-01-18 ENCOUNTER — Encounter: Payer: Self-pay | Admitting: Internal Medicine

## 2018-01-24 DIAGNOSIS — L821 Other seborrheic keratosis: Secondary | ICD-10-CM | POA: Diagnosis not present

## 2018-01-24 DIAGNOSIS — L718 Other rosacea: Secondary | ICD-10-CM | POA: Diagnosis not present

## 2018-01-24 DIAGNOSIS — D692 Other nonthrombocytopenic purpura: Secondary | ICD-10-CM | POA: Diagnosis not present

## 2018-01-24 DIAGNOSIS — D2261 Melanocytic nevi of right upper limb, including shoulder: Secondary | ICD-10-CM | POA: Diagnosis not present

## 2018-01-24 DIAGNOSIS — L858 Other specified epidermal thickening: Secondary | ICD-10-CM | POA: Diagnosis not present

## 2018-01-24 DIAGNOSIS — L603 Nail dystrophy: Secondary | ICD-10-CM | POA: Diagnosis not present

## 2018-01-24 DIAGNOSIS — L570 Actinic keratosis: Secondary | ICD-10-CM | POA: Diagnosis not present

## 2018-01-24 DIAGNOSIS — D225 Melanocytic nevi of trunk: Secondary | ICD-10-CM | POA: Diagnosis not present

## 2018-01-29 ENCOUNTER — Encounter: Payer: Self-pay | Admitting: Internal Medicine

## 2018-01-29 ENCOUNTER — Ambulatory Visit: Payer: PPO | Admitting: Internal Medicine

## 2018-01-29 VITALS — BP 122/62 | HR 93 | Ht 62.0 in | Wt 121.0 lb

## 2018-01-29 DIAGNOSIS — I480 Paroxysmal atrial fibrillation: Secondary | ICD-10-CM | POA: Diagnosis not present

## 2018-01-29 DIAGNOSIS — I10 Essential (primary) hypertension: Secondary | ICD-10-CM

## 2018-01-29 NOTE — Progress Notes (Signed)
PCP: Crist Infante, MD Primary EP: Dr Rayann Heman  Lorraine Little is a 77 y.o. female who presents today for routine electrophysiology followup.  Since last being seen in our clinic, the patient reports doing very well.  Her afib is well controlled.  She has had only a couple symptomatic events on her Jodelle Red since I saw her last and they are for sinus rhythm.  Palpitations are rare.  She describes "forceful heart beat" rather than irregularity.  Today, she denies symptoms of chest pain, shortness of breath,  lower extremity edema, dizziness, presyncope, or syncope.  The patient is otherwise without complaint today.   Past Medical History:  Diagnosis Date  . Adrenal insufficiency (HCC)    takes prednisone 5mg  daily  . Cervical disc disease   . CVA (cerebral infarction)    a. 11/2011  . Depression   . Diverticulosis   . GERD (gastroesophageal reflux disease)    pt denies symptoms with this  . Glaucoma   . Hyperlipidemia    a. Diagnosed 12/2011  . Hypertension   . Insomnia with sleep apnea   . Internal hemorrhoid   . Migraine headache   . Osteoarthritis   . Osteoporosis   . Persistent atrial fibrillation ([place])    a.  Diagnosed 07/2011;  b. Normal nuclear myoview 07/2011 and normal EF at that time;  c. Most recent echo 2/20 /13 - EF 55-60% wit mild-mod MR.;  d. s/p DCCV 01/12/2012  . Polymyalgia rheumatica ([place])    a. On chronic steroids  . Tubular adenoma of colon 04/2007   Past Surgical History:  Procedure Laterality Date  . ATRIAL FIBRILLATION ABLATION N/A 02/16/2017   Procedure: Atrial Fibrillation Ablation;  Surgeon: Thompson Grayer, MD;  Location: [place] [facility];  Service: Cardiovascular;  Laterality: N/A;  . BREAST LUMPECTOMY  1980's   right  x2 (benign)  . CARDIOVERSION  01/12/2012   Procedure: CARDIOVERSION;  Surgeon: Lelon Perla, MD;  Location: [place];  Service: Cardiovascular;  Laterality: N/A;  . CARDIOVERSION  03/28/2012   Procedure: CARDIOVERSION;  Surgeon: Larey Dresser, MD;  Location: [place];  Service: Cardiovascular;  Laterality: N/A;  . CARDIOVERSION  06/29/2012   Procedure: CARDIOVERSION;  Surgeon: Darlin Coco, MD;  Location: [facility] ENDOSCOPY;  Service: Cardiovascular;  Laterality: N/A;  . CARDIOVERSION N/A 12/07/2015   Procedure: CARDIOVERSION;  Surgeon: Josue Hector, MD;  Location: [facility] ENDOSCOPY;  Service: Cardiovascular;  Laterality: N/A;  . ELECTROPHYSIOLOGIC STUDY N/A 01/19/2016   AFib ablation by Dr Rayann Heman  . GLAUCOMA SURGERY    . TEE WITHOUT CARDIOVERSION N/A 01/19/2016   Procedure: TRANSESOPHAGEAL ECHOCARDIOGRAM (TEE);  Surgeon: Thayer Headings, MD;  Location: [facility] ENDOSCOPY;  Service: Cardiovascular;  Laterality: N/A;  . TONSILLECTOMY  age 81    ROS- all systems are reviewed and negatives except as per HPI above  Current Outpatient Medications  Medication Sig Dispense Refill  . atorvastatin (LIPITOR) 80 MG tablet TAKE 1 TABLET BY MOUTH EVERY DAY 30 tablet 9  . Azelaic Acid (FINACEA) 15 % cream Apply 1 application topically at bedtime. After skin is thoroughly washed and patted dry, gently but thoroughly massage a thin film of azelaic acid cream into the affected area twice daily, in the morning and evening.    . Cholecalciferol (VITAMIN D-3) 5000 UNITS TABS Take 1 tablet by mouth daily.     . Coenzyme Q10 (CO Q-10) 300 MG CAPS Take 300 mg by mouth at bedtime.    Marland Kitchen FERROUS  SULFATE PO Take 165 mg by mouth at bedtime.     Marland Kitchen FIBER PO Take 2 tablets by mouth at bedtime. Chewable    . gabapentin (NEURONTIN) 100 MG capsule TAKE ONE CAPSULE BY MOUTH IN THE MORNING ,1 TABLET MID AFTERNOON & 2 TABLETS AT BEDTIME 360 capsule 3  . hydrochlorothiazide (HYDRODIURIL) 25 MG tablet TAKE 1 TABLET BY MOUTH 3 TIMES A WEEK 45 tablet 2  . KLOR-CON 10 10 MEQ tablet TAKE 1 TABLET (10 MEQ TOTAL) BY MOUTH 3 (THREE) TIMES A WEEK. 15 tablet 9  . metoprolol succinate (TOPROL-XL) 25 MG 24 hr tablet Take 1/2 tablet by mouth as needed for afib HR over 100 30 tablet 1  .  omeprazole (PRILOSEC) 20 MG capsule Take 1 capsule (20 mg total) by mouth daily. 90 capsule 3  . Polyvinyl Alcohol-Povidone (REFRESH OP) Place 1 drop into both eyes daily as needed (dry eyes).    . predniSONE (DELTASONE) 1 MG tablet Take 3 mg by mouth daily with breakfast.     . Probiotic Product (PROBIOTIC DAILY PO) Take 1 capsule by mouth daily.    . sodium chloride (OCEAN) 0.65 % SOLN nasal spray Place 1 spray into both nostrils as needed for congestion (As directed).     Alveda Reasons 20 MG TABS tablet TAKE 1 TABLET BY MOUTH DAILY WITH SUPPER 90 tablet 1  . losartan (COZAAR) 100 MG tablet Take 1 tablet (100 mg total) by mouth daily. 90 tablet 3   No current facility-administered medications for this visit.     Physical Exam: Vitals:   01/29/18 1502  BP: 122/62  Pulse: 93  Weight: 54.9 kg (121 lb)  Height: 5\' 2"  (1.575 m)    GEN- The patient is well appearing, alert and oriented x 3 today.   Head- normocephalic, atraumatic Eyes-  Sclera clear, conjunctiva pink Ears- hearing intact Oropharynx- clear Lungs- Clear to ausculation bilaterally, normal work of breathing Heart- Regular rate and rhythm, no murmurs, rubs or gallops, PMI not laterally displaced GI- soft, NT, ND, + BS Extremities- no clubbing, cyanosis, or edema  EKG tracing ordered today is personally reviewed and shows sinus rhythm with PVCs  Assessment and Plan:  1. Paroxysmal atrial fibrillation/ ectopic atrial tachycardia Doing well s/p ablation off AAD therapy chads2vasc score is 4.  Continue anticoagulation.  2. HTN Stable No change required today  Return in 6  months  Thompson Grayer MD, [facility] 01/29/2018 3:10 PM

## 2018-01-29 NOTE — Patient Instructions (Addendum)
Medication Instructions:  Your physician recommends that you continue on your current medications as directed. Please refer to the Current Medication list given to you today.   Labwork: None ordered   Testing/Procedures: None ordered   Follow-Up: Your physician wants you to follow-up in: 6 months with Dr. Allred    Any Other Special Instructions Will Be Listed Below (If Applicable).     If you need a refill on your cardiac medications before your next appointment, please call your pharmacy.   

## 2018-01-31 NOTE — Addendum Note (Signed)
Addended by: Marlis Edelson C on: 01/31/2018 04:22 PM   Modules accepted: Orders

## 2018-03-01 ENCOUNTER — Other Ambulatory Visit (HOSPITAL_COMMUNITY): Payer: Self-pay | Admitting: Nurse Practitioner

## 2018-03-13 DIAGNOSIS — Z1231 Encounter for screening mammogram for malignant neoplasm of breast: Secondary | ICD-10-CM | POA: Diagnosis not present

## 2018-04-05 DIAGNOSIS — G4733 Obstructive sleep apnea (adult) (pediatric): Secondary | ICD-10-CM | POA: Diagnosis not present

## 2018-04-12 DIAGNOSIS — M810 Age-related osteoporosis without current pathological fracture: Secondary | ICD-10-CM | POA: Diagnosis not present

## 2018-04-12 DIAGNOSIS — H40033 Anatomical narrow angle, bilateral: Secondary | ICD-10-CM | POA: Diagnosis not present

## 2018-04-12 DIAGNOSIS — H25011 Cortical age-related cataract, right eye: Secondary | ICD-10-CM | POA: Diagnosis not present

## 2018-04-12 DIAGNOSIS — H2513 Age-related nuclear cataract, bilateral: Secondary | ICD-10-CM | POA: Diagnosis not present

## 2018-04-12 DIAGNOSIS — I10 Essential (primary) hypertension: Secondary | ICD-10-CM | POA: Diagnosis not present

## 2018-04-12 DIAGNOSIS — H52222 Regular astigmatism, left eye: Secondary | ICD-10-CM | POA: Diagnosis not present

## 2018-04-12 DIAGNOSIS — H2512 Age-related nuclear cataract, left eye: Secondary | ICD-10-CM | POA: Diagnosis not present

## 2018-04-12 DIAGNOSIS — E7849 Other hyperlipidemia: Secondary | ICD-10-CM | POA: Diagnosis not present

## 2018-04-12 DIAGNOSIS — H25012 Cortical age-related cataract, left eye: Secondary | ICD-10-CM | POA: Diagnosis not present

## 2018-04-12 DIAGNOSIS — R82998 Other abnormal findings in urine: Secondary | ICD-10-CM | POA: Diagnosis not present

## 2018-04-12 DIAGNOSIS — R946 Abnormal results of thyroid function studies: Secondary | ICD-10-CM | POA: Diagnosis not present

## 2018-04-12 DIAGNOSIS — H2511 Age-related nuclear cataract, right eye: Secondary | ICD-10-CM | POA: Diagnosis not present

## 2018-04-19 DIAGNOSIS — I10 Essential (primary) hypertension: Secondary | ICD-10-CM | POA: Diagnosis not present

## 2018-04-19 DIAGNOSIS — Z6822 Body mass index (BMI) 22.0-22.9, adult: Secondary | ICD-10-CM | POA: Diagnosis not present

## 2018-04-19 DIAGNOSIS — M353 Polymyalgia rheumatica: Secondary | ICD-10-CM | POA: Diagnosis not present

## 2018-04-19 DIAGNOSIS — R159 Full incontinence of feces: Secondary | ICD-10-CM | POA: Diagnosis not present

## 2018-04-19 DIAGNOSIS — M810 Age-related osteoporosis without current pathological fracture: Secondary | ICD-10-CM | POA: Diagnosis not present

## 2018-04-19 DIAGNOSIS — I6340 Cerebral infarction due to embolism of unspecified cerebral artery: Secondary | ICD-10-CM | POA: Diagnosis not present

## 2018-04-19 DIAGNOSIS — E7849 Other hyperlipidemia: Secondary | ICD-10-CM | POA: Diagnosis not present

## 2018-04-19 DIAGNOSIS — I480 Paroxysmal atrial fibrillation: Secondary | ICD-10-CM | POA: Diagnosis not present

## 2018-04-19 DIAGNOSIS — H4089 Other specified glaucoma: Secondary | ICD-10-CM | POA: Diagnosis not present

## 2018-04-19 DIAGNOSIS — Z0000 Encounter for general adult medical examination without abnormal findings: Secondary | ICD-10-CM | POA: Diagnosis not present

## 2018-04-19 DIAGNOSIS — Z1212 Encounter for screening for malignant neoplasm of rectum: Secondary | ICD-10-CM | POA: Diagnosis not present

## 2018-04-19 DIAGNOSIS — Z1389 Encounter for screening for other disorder: Secondary | ICD-10-CM | POA: Diagnosis not present

## 2018-04-19 DIAGNOSIS — R195 Other fecal abnormalities: Secondary | ICD-10-CM | POA: Diagnosis not present

## 2018-04-24 ENCOUNTER — Other Ambulatory Visit: Payer: Self-pay | Admitting: Internal Medicine

## 2018-04-24 NOTE — Telephone Encounter (Signed)
SCr 0.8 in [facility], CrCl 49.4, previously 51. Ok to refill full dose Xarelto.

## 2018-04-25 DIAGNOSIS — H40033 Anatomical narrow angle, bilateral: Secondary | ICD-10-CM | POA: Diagnosis not present

## 2018-04-25 DIAGNOSIS — K219 Gastro-esophageal reflux disease without esophagitis: Secondary | ICD-10-CM | POA: Diagnosis not present

## 2018-04-25 DIAGNOSIS — Z7952 Long term (current) use of systemic steroids: Secondary | ICD-10-CM | POA: Diagnosis not present

## 2018-04-25 DIAGNOSIS — H2512 Age-related nuclear cataract, left eye: Secondary | ICD-10-CM | POA: Diagnosis not present

## 2018-04-25 DIAGNOSIS — H52222 Regular astigmatism, left eye: Secondary | ICD-10-CM | POA: Insufficient documentation

## 2018-04-25 DIAGNOSIS — Z8673 Personal history of transient ischemic attack (TIA), and cerebral infarction without residual deficits: Secondary | ICD-10-CM | POA: Diagnosis not present

## 2018-04-25 DIAGNOSIS — G4730 Sleep apnea, unspecified: Secondary | ICD-10-CM | POA: Diagnosis not present

## 2018-04-25 DIAGNOSIS — M069 Rheumatoid arthritis, unspecified: Secondary | ICD-10-CM | POA: Diagnosis not present

## 2018-04-25 DIAGNOSIS — Z7901 Long term (current) use of anticoagulants: Secondary | ICD-10-CM | POA: Diagnosis not present

## 2018-04-25 DIAGNOSIS — I480 Paroxysmal atrial fibrillation: Secondary | ICD-10-CM | POA: Diagnosis not present

## 2018-04-25 DIAGNOSIS — H2513 Age-related nuclear cataract, bilateral: Secondary | ICD-10-CM | POA: Diagnosis not present

## 2018-04-25 DIAGNOSIS — H25012 Cortical age-related cataract, left eye: Secondary | ICD-10-CM | POA: Diagnosis not present

## 2018-04-25 DIAGNOSIS — H25013 Cortical age-related cataract, bilateral: Secondary | ICD-10-CM | POA: Diagnosis not present

## 2018-04-25 DIAGNOSIS — Z79899 Other long term (current) drug therapy: Secondary | ICD-10-CM | POA: Diagnosis not present

## 2018-04-25 DIAGNOSIS — I10 Essential (primary) hypertension: Secondary | ICD-10-CM | POA: Diagnosis not present

## 2018-04-25 DIAGNOSIS — Z9889 Other specified postprocedural states: Secondary | ICD-10-CM | POA: Diagnosis not present

## 2018-04-25 DIAGNOSIS — G43909 Migraine, unspecified, not intractable, without status migrainosus: Secondary | ICD-10-CM | POA: Diagnosis not present

## 2018-04-27 ENCOUNTER — Telehealth (HOSPITAL_COMMUNITY): Payer: Self-pay | Admitting: *Deleted

## 2018-04-27 NOTE — Telephone Encounter (Addendum)
Patient had called into afib clinic stating she was worried she had an episode of afib the night before. Pt sent in Lorraine Little readings -- all strips were reviewed by Roderic Palau NP no afib detected. All strips were NSR with artifact . Pt notified.

## 2018-05-07 ENCOUNTER — Other Ambulatory Visit (HOSPITAL_COMMUNITY): Payer: Self-pay | Admitting: Internal Medicine

## 2018-05-10 ENCOUNTER — Telehealth: Payer: Self-pay | Admitting: Internal Medicine

## 2018-05-10 NOTE — Telephone Encounter (Signed)
Please advise on refill request as patient does not have a recent lipid panel in epic and it does not look like Dr Rayann Heman manages her lipids. Thank, MI

## 2018-05-10 NOTE — Telephone Encounter (Signed)
New message   Per Darnelle Maffucci    *STAT* If patient is at the pharmacy, call can be transferred to refill team.   1. Which medications need to be refilled? (please list name of each medication and dose if known) atorvastatin (LIPITOR) 80 MG tablet  2. Which pharmacy/location (including street and city if local pharmacy) is medication to be sent to?[facility] on [address]   3. Do they need a 30 day or 90 day supply? 30 days

## 2018-05-11 NOTE — Telephone Encounter (Signed)
Dr. Greggory Brandy can do this.  Agree with check of fasting lipids.

## 2018-05-12 ENCOUNTER — Other Ambulatory Visit: Payer: Self-pay | Admitting: Internal Medicine

## 2018-05-28 ENCOUNTER — Other Ambulatory Visit: Payer: Self-pay

## 2018-05-28 DIAGNOSIS — E785 Hyperlipidemia, unspecified: Secondary | ICD-10-CM

## 2018-05-29 NOTE — Progress Notes (Signed)
Per Dr. Rayann Heman, Refer Pt to lipid clinic for management of cholesterol medications.

## 2018-05-29 NOTE — Addendum Note (Signed)
Addended by: Willeen Cass A on: 05/29/2018 05:12 PM   Modules accepted: Orders

## 2018-06-01 MED ORDER — ATORVASTATIN CALCIUM 80 MG PO TABS: 80.0000 mg | ORAL_TABLET | Freq: Every day | ORAL | 2 refills | Status: DC

## 2018-06-01 NOTE — Telephone Encounter (Signed)
Per Willeen Cass, RN, Dr. Jackalyn Lombard nurse, ok to refill 3 mos until appt is made with Lipid clinic. Explained everything to pt and pt is waiting to hear from Lipid clinic for an appt. Pt verbalized understanding.

## 2018-06-01 NOTE — Addendum Note (Signed)
Addended by: Derl Barrow on: 06/01/2018 10:27 AM   Modules accepted: Orders

## 2018-06-19 ENCOUNTER — Other Ambulatory Visit: Payer: Self-pay | Admitting: Neurology

## 2018-06-22 DIAGNOSIS — Z6822 Body mass index (BMI) 22.0-22.9, adult: Secondary | ICD-10-CM | POA: Diagnosis not present

## 2018-06-22 DIAGNOSIS — L509 Urticaria, unspecified: Secondary | ICD-10-CM | POA: Diagnosis not present

## 2018-06-26 ENCOUNTER — Ambulatory Visit: Payer: PPO | Admitting: Pharmacist

## 2018-06-26 DIAGNOSIS — E785 Hyperlipidemia, unspecified: Secondary | ICD-10-CM

## 2018-06-26 NOTE — Patient Instructions (Signed)
It was great seeing you today!  Today we discussed diet and exercise to reduce your LDL. Your last LDL (bad cholesterol) in June was 71, and your goal is <70. We have scheduled you for follow up labs in October as well as a clinic visit. If your labs look good we can cancel the clinic visit. We also discussed that if your labs are still not at goal, adding on a medication called Zetia (ezetimibe). If you have any questions please give the clinic a call at [phone].  Please look over the following information about diet and exercise.  Cholesterol Cholesterol is a fat. Your body needs a small amount of cholesterol. Cholesterol (plaque) may build up in your blood vessels (arteries). That makes you more likely to have a heart attack or stroke. You cannot feel your cholesterol level. Having a blood test is the only way to find out if your level is high. Keep your test results. Work with your doctor to keep your cholesterol at a good level. What do the results mean?  Total cholesterol is how much cholesterol is in your blood.  LDL is bad cholesterol. This is the type that can build up. Try to have low LDL.  HDL is good cholesterol. It cleans your blood vessels and carries LDL away. Try to have high HDL.  Triglycerides are fat that the body can store or burn for energy. What are good levels of cholesterol?  Total cholesterol below 200.  LDL below 100 is good for people who have health risks. LDL below 70 is good for people who have very high risks.  HDL above 40 is good. It is best to have HDL of 60 or higher.  Triglycerides below 150. How can I lower my cholesterol? Diet Follow your diet program as told by your doctor.  Choose fish, white meat chicken, or [place] that is roasted or baked. Try not to eat red meat, fried foods, sausage, or lunch meats.  Eat lots of fresh fruits and vegetables.  Choose whole grains, beans, pasta, potatoes, and cereals.  Choose olive oil, corn oil, or  canola oil. Only use small amounts.  Try not to eat butter, mayonnaise, shortening, or palm kernel oils.  Try not to eat foods with trans fats.  Choose low-fat or nonfat dairy foods. ? Drink skim or nonfat milk. ? Eat low-fat or nonfat yogurt and cheeses. ? Try not to drink whole milk or cream. ? Try not to eat ice cream, egg yolks, or full-fat cheeses.  Healthy desserts include angel food cake, ginger snaps, animal crackers, hard candy, popsicles, and low-fat or nonfat frozen yogurt. Try not to eat pastries, cakes, pies, and cookies.  Exercise Follow your exercise program as told by your doctor.  Be more active. Try gardening, walking, and taking the stairs.  Ask your doctor about ways that you can be more active.  Medicine  Take over-the-counter and prescription medicines only as told by your doctor. This information is not intended to replace advice given to you by your health care provider. Make sure you discuss any questions you have with your health care provider. Document Released: 01/13/2009 Document Revised: 05/18/2016 Document Reviewed: 04/28/2016 Elsevier Interactive Patient Education  [employer].

## 2018-06-26 NOTE — Progress Notes (Signed)
Patient ID: [name]                 DOB: 18-Oct-1941                    MRN: [id number]     HPI: Lorraine Little is a 77 y.o. female patient of Dr. Rayann Little that presents today for lipid evaluation.  PMH includes HTN, atrial fibrillation, HLD, sinus bradycardia, CVA, GERD, sleep apnea, depression, OA, migraines. She was last seen in clinic by Dr. Rayann Little on 01/29/18 for a fib and HTN. Patient's pharmacy contacted clinic on 06/01/18 regarding cholesterol medication, however patient's PCP manages her cholesterol. Appointment was then made for lipid clinic as last lipid panel on file is from 2013.  Patient presents today in good spirits for lipid management. Reports good adherence to atorvastatin, has not had any gaps in medications. She also denies muscle pain. Discussed cholesterol medication options and lifestyle modifications with patient. Patient prefers to try lifestyle modifications first as she is already on many medications.  Risk Factors: CVA, HTN, HLD, a fib LDL Goal: <70 mg/dL  Current Medications: atorvastatin 80 mg daily  Intolerances: none  Diet: most meals are from home. Eats out 3-4 times a week at [employer]. Mostly eats chicken and seafood, seldomly pork or beef. Her husband is on the keto diet so she has been eating a lot of vegetables. Drinks 1 glass of unsweet decaf tea, and 3-4 cups decaf coffee with Stevia a day. Does drink some water but "not enough".  [place] [facility] [place]water aerobics, for mylagia) for 45 minutes. Gardens and does yardwork  Family History: Mother- HF, Father- 60  Social History: never smoker, rarely drinks 1 glass of wine   Labs: 04/12/2018 TC 160, TG 55, HDL 78, LDL 71  Past Medical History:  Diagnosis Date  . Adrenal insufficiency (HCC)    takes prednisone 5mg  daily  . Cervical disc disease   . CVA (cerebral infarction)    a. 11/2011  . Depression   . Diverticulosis   . GERD  (gastroesophageal reflux disease)    pt denies symptoms with this  . Glaucoma   . Hyperlipidemia    a. Diagnosed 12/2011  . Hypertension   . Insomnia with sleep apnea   . Internal hemorrhoid   . Migraine headache   . Osteoarthritis   . Osteoporosis   . Persistent atrial fibrillation ([place])    a.  Diagnosed 07/2011;  b. Normal nuclear myoview 07/2011 and normal EF at that time;  c. Most recent echo 2/20 /13 - EF 55-60% wit mild-mod MR.;  d. s/p DCCV 01/12/2012  . Polymyalgia rheumatica ([place])    a. On chronic steroids  . Tubular adenoma of colon 04/2007    Current Outpatient Medications on File Prior to Visit  Medication Sig Dispense Refill  . atorvastatin (LIPITOR) 80 MG tablet Take 1 tablet (80 mg total) by mouth daily. 30 tablet 2  . Azelaic Acid (FINACEA) 15 % cream Apply 1 application topically at bedtime. After skin is thoroughly washed and patted dry, gently but thoroughly massage a thin film of azelaic acid cream into the affected area twice daily, in the morning and evening.    . Cholecalciferol (VITAMIN D-3) 5000 UNITS TABS Take 1 tablet by mouth daily.     . Coenzyme Q10 (CO Q-10) 300 MG CAPS Take 300 mg by mouth at bedtime.    Marland Kitchen FERROUS SULFATE PO  Take 165 mg by mouth at bedtime.     Marland Kitchen FIBER PO Take 2 tablets by mouth at bedtime. Chewable    . gabapentin (NEURONTIN) 100 MG capsule TAKE ONE CAPSULE BY MOUTH IN THE MORNING ,1 TABLET MID AFTERNOON & 2 TABLETS AT BEDTIME 360 capsule 0  . hydrochlorothiazide (HYDRODIURIL) 25 MG tablet TAKE 1 TABLET BY MOUTH 3 TIMES A WEEK 45 tablet 2  . KLOR-CON 10 10 MEQ tablet TAKE 1 TABLET (10 MEQ TOTAL) BY MOUTH 3 (THREE) TIMES A WEEK. 15 tablet 9  . losartan (COZAAR) 100 MG tablet TAKE 1 TABLET BY MOUTH EVERY DAY 90 tablet 2  . metoprolol succinate (TOPROL-XL) 25 MG 24 hr tablet TAKE 1/2 TABLET BY MOUTH AS NEEDED FOR AFIB HR OVER 100 30 tablet 1  . omeprazole (PRILOSEC) 20 MG capsule Take 1 capsule (20 mg total) by mouth daily. 90 capsule 3  .  Polyvinyl Alcohol-Povidone (REFRESH OP) Place 1 drop into both eyes daily as needed (dry eyes).    . predniSONE (DELTASONE) 1 MG tablet Take 3 mg by mouth daily with breakfast.     . Probiotic Product (PROBIOTIC DAILY PO) Take 1 capsule by mouth daily.    . sodium chloride (OCEAN) 0.65 % SOLN nasal spray Place 1 spray into both nostrils as needed for congestion (As directed).     Alveda Reasons 20 MG TABS tablet TAKE 1 TABLET BY MOUTH DAILY WITH SUPPER 90 tablet 1  . [DISCONTINUED] flecainide (TAMBOCOR) 100 MG tablet Take 100mg  1 tablet prn onset of atrial fib. 30 tablet 6   No current facility-administered medications on file prior to visit.     No Known Allergies  Assessment/Plan:  1. Hyperlipidemia -  LDL currently borderline above goal (LDL<70 mg/dL). Informed patient about LDL goal and risk reduction. Discussed ezetimibe and lifestyle modifications with patient. Patient prefers to try lifestyle modifications first as her LDL is very close to goal and she is already on many medications. Provided education and information about diet and exercise modifications. F/u lipid panel on 08/21/18 and f/u lipid clinic visit on 08/23/18. If lipid panel from 10/22 returns within goal, can cancel lipid clinic visit on 10/24.  Patient seen with Danella Penton, PharmD Candidate  Thanks, Tana Coast, PharmD

## 2018-06-26 NOTE — Progress Notes (Deleted)
Patient ID: Lorraine Little                 DOB: July 25, 1941                    MRN: [id number]     HPI: Lorraine Little is a 77 y.o. female patient of Dr. Rayann Heman that presents today for lipid evaluation.  PMH includes HTN, atrial fibrillation, HLD, sinus bradycardia, CVA, GERD, sleep apnea, depression, OA, migraines. She was last seen in clinic by Dr. Rayann Heman on 01/29/18 for a fib and HTN. Patient's pharmacy contacted clinic on 06/01/18 regarding cholesterol medication, however patient's PCP manages her cholesterol. Appointment was then made for lipid clinic as last lipid panel on file is from 2013.  Patient presents today for lipid management.  Risk Factors: CVA, HTN, HLD, a fib LDL Goal: <70 mg/dL  Current Medications: atorvastatin 80 mg daily Intolerances: none  Diet:   Exercise:   Family History: Mother- HF, Father- 90  Social History: never smoker, rarely drinks 1 glass of wine   Labs: 04/12/2018 TC 160, TG 55, HDL 78, LDL 71  Past Medical History:  Diagnosis Date  . Adrenal insufficiency (HCC)    takes prednisone 5mg  daily  . Cervical disc disease   . CVA (cerebral infarction)    a. 11/2011  . Depression   . Diverticulosis   . GERD (gastroesophageal reflux disease)    pt denies symptoms with this  . Glaucoma   . Hyperlipidemia    a. Diagnosed 12/2011  . Hypertension   . Insomnia with sleep apnea   . Internal hemorrhoid   . Migraine headache   . Osteoarthritis   . Osteoporosis   . Persistent atrial fibrillation ([place])    a.  Diagnosed 07/2011;  b. Normal nuclear myoview 07/2011 and normal EF at that time;  c. Most recent echo 2/20 /13 - EF 55-60% wit mild-mod MR.;  d. s/p DCCV 01/12/2012  . Polymyalgia rheumatica ([place])    a. On chronic steroids  . Tubular adenoma of colon 04/2007    Current Outpatient Medications on File Prior to Visit  Medication Sig Dispense Refill  . atorvastatin (LIPITOR) 80 MG tablet Take 1 tablet (80 mg total) by mouth daily. 30 tablet 2  .  Azelaic Acid (FINACEA) 15 % cream Apply 1 application topically at bedtime. After skin is thoroughly washed and patted dry, gently but thoroughly massage a thin film of azelaic acid cream into the affected area twice daily, in the morning and evening.    . Cholecalciferol (VITAMIN D-3) 5000 UNITS TABS Take 1 tablet by mouth daily.     . Coenzyme Q10 (CO Q-10) 300 MG CAPS Take 300 mg by mouth at bedtime.    Marland Kitchen FERROUS SULFATE PO Take 165 mg by mouth at bedtime.     Marland Kitchen FIBER PO Take 2 tablets by mouth at bedtime. Chewable    . gabapentin (NEURONTIN) 100 MG capsule TAKE ONE CAPSULE BY MOUTH IN THE MORNING ,1 TABLET MID AFTERNOON & 2 TABLETS AT BEDTIME 360 capsule 0  . hydrochlorothiazide (HYDRODIURIL) 25 MG tablet TAKE 1 TABLET BY MOUTH 3 TIMES A WEEK 45 tablet 2  . KLOR-CON 10 10 MEQ tablet TAKE 1 TABLET (10 MEQ TOTAL) BY MOUTH 3 (THREE) TIMES A WEEK. 15 tablet 9  . losartan (COZAAR) 100 MG tablet TAKE 1 TABLET BY MOUTH EVERY DAY 90 tablet 2  . metoprolol succinate (TOPROL-XL) 25 MG 24 hr tablet TAKE 1/2 TABLET  BY MOUTH AS NEEDED FOR AFIB HR OVER 100 30 tablet 1  . omeprazole (PRILOSEC) 20 MG capsule Take 1 capsule (20 mg total) by mouth daily. 90 capsule 3  . Polyvinyl Alcohol-Povidone (REFRESH OP) Place 1 drop into both eyes daily as needed (dry eyes).    . predniSONE (DELTASONE) 1 MG tablet Take 3 mg by mouth daily with breakfast.     . Probiotic Product (PROBIOTIC DAILY PO) Take 1 capsule by mouth daily.    . sodium chloride (OCEAN) 0.65 % SOLN nasal spray Place 1 spray into both nostrils as needed for congestion (As directed).     Alveda Reasons 20 MG TABS tablet TAKE 1 TABLET BY MOUTH DAILY WITH SUPPER 90 tablet 1  . [DISCONTINUED] flecainide (TAMBOCOR) 100 MG tablet Take 100mg  1 tablet prn onset of atrial fib. 30 tablet 6   No current facility-administered medications on file prior to visit.     No Known Allergies  Assessment/Plan:  Hyperlipidemia: LDL currently borderline above goal (LDL<70  mg/dL).   - add ezetimibe, f/u lipid panel in 2 months, does not need f/u with lipid clinic  Get lipid panel Can continue atorvastatin.   Thank you,  Lelan Pons. Patterson Hammersmith, [facility]  06/26/2018 9:45 AM

## 2018-07-05 DIAGNOSIS — G4733 Obstructive sleep apnea (adult) (pediatric): Secondary | ICD-10-CM | POA: Diagnosis not present

## 2018-07-09 ENCOUNTER — Other Ambulatory Visit (HOSPITAL_COMMUNITY): Payer: Self-pay | Admitting: Nurse Practitioner

## 2018-07-22 ENCOUNTER — Encounter: Payer: Self-pay | Admitting: Neurology

## 2018-07-24 ENCOUNTER — Encounter: Payer: Self-pay | Admitting: Neurology

## 2018-07-24 ENCOUNTER — Ambulatory Visit (INDEPENDENT_AMBULATORY_CARE_PROVIDER_SITE_OTHER): Payer: PPO | Admitting: Neurology

## 2018-07-24 ENCOUNTER — Ambulatory Visit: Payer: PPO | Admitting: Neurology

## 2018-07-24 VITALS — BP 138/74 | HR 83 | Ht 62.0 in | Wt 123.0 lb

## 2018-07-24 DIAGNOSIS — R002 Palpitations: Secondary | ICD-10-CM | POA: Insufficient documentation

## 2018-07-24 DIAGNOSIS — G4733 Obstructive sleep apnea (adult) (pediatric): Secondary | ICD-10-CM | POA: Diagnosis not present

## 2018-07-24 DIAGNOSIS — R42 Dizziness and giddiness: Secondary | ICD-10-CM | POA: Diagnosis not present

## 2018-07-24 DIAGNOSIS — Z9989 Dependence on other enabling machines and devices: Secondary | ICD-10-CM

## 2018-07-24 NOTE — Progress Notes (Signed)
[facility]  Provider:  Larey Seat, M D  Referring Provider: Crist Infante, MD Primary Care Physician:  Crist Infante, MD  Chief Complaint  Patient presents with  . Follow-up    pt alone, rm 10. pt states CPAP is working well. DME [facility]. She states the mask is working well that they were able to provide for her    HPI:  Lorraine Little is a 77 y.o., caucasian, right handed female patient with insomnia and OSA, now both conditions are well treated. She mentioned that she had a fall on 19th of December 2018. She felt her left leg wouldn't move after she rose for her chair. She fell onto the carpet. Fractured her ankle.    08-14-14 ; Lorraine Little underwent a sleep study on 11-27-13. AHI was 33.1/h and her RDI was 33.1/hr. as well. Not a mild case of apnea.The lowest oxygen level was at 78% , 02 saturation was 12.6 minutes total time below 89% saturation. She had irregular heart rate and normal sinus rhythm throughout the diagnostic study. There were no periodic limb movements noted. Based on the high AHI CPAP was initiated and the patient was only titrated to 5 cm with. She still had a very poor sleep efficiency but AHI was reduced to 0. She used a nasal pillow and an ESON  nasal mask.  Download was reviewed today. She brought me today at a download from her machine and it shows that she has used the machine 5 hours and 5 minutes nightly , a 97% compliance over 30 days.  Again,  her main problem now is not the residual apnea but her insomnia. She had trouble sleeping for a long time before OSA was diagnosed and CPAP initiated. The CPAP has helped her headaches! Her problem is sleep initiation, not staying asleep. She has less palpiations. Continues on amiodarone , which can induce insomnia . She is tolerating it well, but had some neuropathic changes.  Belsomra at 10 mg was felt too strong, to groggy making in AM.  I offered her 5 mg today. I would like for her to consider the  ablation therapy form atrial fibrillation, and to be able to d/c the amiodarone treatment. She will discuss this step with Dr. Virl Axe , her cardiologist and electrophysiologist this afternoon.   Revisit from 08-20-15, Lorraine Little is here today and reports that she has not used her CPAP for over 5 months. Her headaches however have not returned. She does not feel it had any impact on her sleep or lack of sleep. She has been treated with gabapentin and she thinks this has successfully controlled her headaches. Her fatigue severity score today is 31 points and her Epworth sleepiness score is 4 points the geriatric depression score is endorsed at one point. Overall she feels well. She never found an comfortable interface without any breakout or rash, and the nasal pillow dislodged frequently. This actually interrupted her sleep and she felt that this she got less sleep on CPAP than without. She effectively discontinued CPAP use. Her cardiologist suggested to use CPAP for better HTN control. She will have to make a decision.   Rv 01-14-16  I agree with Dr Rayann Heman that atrial fibrillations are precipitated by risk factors such as caffeine or stimulant use,  OSA. Her apnea is moderate -severe. She struggles with Insomnia and felt CPAP was contributing or at least not helping. She reported air leaks form nasal interface , irritating her eyes. I will  reinitiate CPAP at current pressure.  She endorsed the Epworth sleepiness score at 3 points fatigue severity at 32 points, geriatric depression at one point. Today's download shows an 83% compliance was 24 out of 30 days of over 4 hours of daily use. The patient used to machine 5 hours and 31 minutes a day , on average this is an Auto machine between 5 and 10 cm water pressure was 1 cm EPR, residual AHI is 0.6 and excellent result the 95th percentile pressure is 10 cm water. I will ask her to have an appointment for a daytime PAP Nap. This should reveal the best  fitting interface - perhaps a wisp?   She  is seen here as a revisit  from Dr. Joylene Draft for evaluation of sleep apnea with insomnia on CPAP . Dr. Leonie Man has seen this patient on  [facility] [place] [facility] service.     07-19-2016 Lorraine Little an established patient in the [place] and is presenting here today with a complaint of intermittent insomnia. She can not even sleep on vacation while at her beach house. This has been a problem for several years. Her primary care physician [name] has at times prescribed sleep aids,  but felt that it was reasonable to evaluate her sleep further before continuing to prescribe sedatives. The sleep test was positive and the patient was started on CPAP- after initially struggling, she is now compliant and used CPAP with great regularity. The mask took some getting used to. She remains hoarse - see Assessment and Plan. She has seen a specialist at [facility], has a problem at the voice box, a tremor.  Just this Sunday night she reports she had severe palpitations and almost fell that her whole body was moving she couldn't keep still. This was an exception because most nights she is doing very well with the use of her CPAP and has actually some restful sleep. I was able to see today's compliance report dated 18th of September 2017 and she has 100% compliance for days 93 complaint for hours of use for 7 hours and 30 minutes average she is using an AutoSet between 5 and 10 with a residual AHI of 0.5 a pressure requirement at the 91st percentile is 10 cm water. No changes have to be made. She is not using any Ambien or Xanax or any sleep aid ! Good result.  She will see Dr Rayann Heman , follow up from ablation !  I had the pleasure of seeing Lorraine Little today on 07/20/2017, this was meant to be a routine follow-up visit for CPAP compliance but the patient also remarked that she has been back in atrial fibrillation and will see her cardiologist, Dr. Rayann Heman, to plan a new  ablation procedure. Lorraine Little has been compliant with her CPAP she has used at 30 out of 30 days was 97 complaint percent compliance by hours. On average use is 7 hours and 8 minutes at night this is an AutoSet between 5 and 10 cm water was 1 cm EPR, the patient's AHI is 1.3. This is an excellent result The patient noticed palpitations over the last month and more frequently at a time when she was compliant with CPAP use. She does need better follow-up from her durable medical equipment company. 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 He is now with [facility] and, if she likes, I will refer her there. She would like to  try a wisp.   Interval history from 24 July 2018, I have the pleasure of seeing Lorraine Little today for her yearly revisit, she still suffers from atrial fibrillation, palpitation and is status post 2 ablations by now.  Her CPAP experience has become better and she is now 100% compliant over the last 90 days, with an average use at time of 7 hours 33 minutes on an AutoSet CPAP which allows a pressure window between 5 and 10 cmH2O was 1 cm EPR, residual AHI is 0.4 which is an excellent resolution, she does not have Cheyne-Stokes respirations, the 95th percentile pressure is at 9.9 cmH2O and she does have some moderate air leakage which she attributes to oral air leaking.  She may try to use a chinstrap again if she wants to, but I think that given the low residual apnea count she is actually well treated without.       Review of Systems: Out of a complete 14 system review, the patient complains of only the following symptoms, and all other reviewed systems are negative. Snoring, dry mouth, sore nostrils on pillows,  Bridge of nose pressure with nasal mask and FFM - intolerant of a chin strap. Still has palpitations, Insomnia better controlled on melatonin.   Epworth score at 4 points, FSS at 25/ 63 points.    Social History   Socioeconomic  History  . Marital status: Married    Spouse name: [name]  . Number of children: 3  . Years of education: college  . Highest education level: Not on file  Occupational History  . Occupation: retired     Comment: Pharmacist, hospital , [place]  Social Needs  . Financial resource strain: Not on file  . Food insecurity:    Worry: Not on file    Inability: Not on file  . Transportation needs:    Medical: Not on file    Non-medical: Not on file  Tobacco Use  . Smoking status: Never Smoker  . Smokeless tobacco: Never Used  Substance and Sexual Activity  . Alcohol use: Yes    Alcohol/week: 1.0 standard drinks    Types: 1 Glasses of wine per week    Comment: rare  . Drug use: No  . Sexual activity: Not Currently  Lifestyle  . Physical activity:    Days per week: Not on file    Minutes per session: Not on file  . Stress: Not on file  Relationships  . Social connections:    Talks on phone: Not on file    Gets together: Not on file    Attends religious service: Not on file    Active member of club or organization: Not on file    Attends meetings of clubs or organizations: Not on file    Relationship status: Not on file  . Intimate partner violence:    Fear of current or ex partner: Not on file    Emotionally abused: Not on file    Physically abused: Not on file    Forced sexual activity: Not on file  Other Topics Concern  . Not on file  Social History Narrative   Pt lives in [place] with spouse.  [name])  She was previously a Education officer, museum.    Caffeine- None   Right handed   Patient has three children.   Patient has a college education.    Family History  Problem Relation Age of Onset  . Stroke Mother        Deceased @  60  . Heart failure Mother        died from  . Liver cancer Father        Deceased @ 31  . Colon cancer Paternal Grandmother      Allergies as of 07/24/2018  . (No Known Allergies)    Vitals: BP 138/74   Pulse 83   Ht 5\' 2"  (1.575 m)   Wt 123 lb (55.8  kg)   BMI 22.50 kg/m  Last Weight:  Wt Readings from Last 1 Encounters:  07/24/18 123 lb (55.8 kg)   Last Height:   Ht Readings from Last 1 Encounters:  07/24/18 5\' 2"  (1.575 m)    Physical exam: hoarse voice.   General: The patient is awake, alert and appears not in acute distress. The patient is very well groomed. Head: Normocephalic, atraumatic. Neck is supple. Mallampati 2, neck circumference: 14 inches . No retrognathia.  Cardiovascular:  Regular rate and rhythm , without murmurs or carotid bruit, and without distended neck veins. Respiratory: Lungs are clear to auscultation. Skin:  Without evidence of edema, Trunk: no scoliosis. Neurologic exam : The patient is awake and alert, oriented to place and time.  Memory subjective described as intact.  There is a normal attention span & concentration ability. The patient is articulate and cooperative.  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  Mood and affect are anxious. Cranial nerves: Pupils are equal and briskly reactive to light. Facial motor strength is symmetric,  tongue and uvula move in midline.  Motor exam:   Normal tone and normal muscle bulk and symmetric normal strength in all extremities. Coordination: Rapid alternating movements in the fingers/hands is normal, no resting tremor, no cog wheeling. . Gait and station: Patient walks without assistive device. Deep tendon reflexes: in the upper and lower extremities are symmetric and intact.    Assessment:  After physical and neurologic examination, review of laboratory studies, imaging, neurophysiology testing and pre-existing records,  25 minute assessment with more than 50% of the face to face time dedicated to discussion of atrial fib overlay with OSA, insomnia now not longer present.     1)  OSA on CPAP treatment with very good ( 100% ) compliance, finally happy with her mask fit - Thank you, Jonni Sanger !!!.   She has some nights an oral air leak but chin strap is not comfortable, she should not go to a FFM.  Epworth score at 4 points, FSS at 25/ 63 points.     2)  Atrial fibrillation, following with Dr. Rayann Heman . She is status post 2 ablations , still paroxysmal.   3)  She has had more headaches and slight dizziness with rising form a chair - related to a fib?Marland Kitchen Will do a gait exam again with next NP visit.      RV in 6 month with NP and me alternating.    Larey Seat, MD

## 2018-07-27 ENCOUNTER — Encounter: Payer: Self-pay | Admitting: Internal Medicine

## 2018-07-27 ENCOUNTER — Ambulatory Visit: Payer: PPO | Admitting: Internal Medicine

## 2018-07-27 VITALS — BP 128/70 | HR 95 | Ht 62.0 in | Wt 119.4 lb

## 2018-07-27 DIAGNOSIS — I471 Supraventricular tachycardia: Secondary | ICD-10-CM

## 2018-07-27 DIAGNOSIS — I480 Paroxysmal atrial fibrillation: Secondary | ICD-10-CM | POA: Diagnosis not present

## 2018-07-27 DIAGNOSIS — I10 Essential (primary) hypertension: Secondary | ICD-10-CM | POA: Diagnosis not present

## 2018-07-27 NOTE — Progress Notes (Signed)
PCP: Crist Infante, MD   Primary EP: Dr Rayann Heman  Lorraine Little is a 77 y.o. female who presents today for routine electrophysiology followup.  Since last being seen in our clinic, the patient reports doing very well.  She has occasional palpitations.  I have personally reviewed all Kardia tracings today which reveal only sinus rhythm with PACs.  I do not see any afib episodes.  Today, she denies symptoms of chest pain, shortness of breath,  lower extremity edema, dizziness, presyncope, or syncope.  The patient is otherwise without complaint today.   Past Medical History:  Diagnosis Date  . Adrenal insufficiency (HCC)    takes prednisone 5mg  daily  . Cervical disc disease   . CVA (cerebral infarction)    a. 11/2011  . Depression   . Diverticulosis   . GERD (gastroesophageal reflux disease)    pt denies symptoms with this  . Glaucoma   . Hyperlipidemia    a. Diagnosed 12/2011  . Hypertension   . Insomnia with sleep apnea   . Internal hemorrhoid   . Migraine headache   . Osteoarthritis   . Osteoporosis   . Persistent atrial fibrillation ([place])    a.  Diagnosed 07/2011;  b. Normal nuclear myoview 07/2011 and normal EF at that time;  c. Most recent echo 2/20 /13 - EF 55-60% wit mild-mod MR.;  d. s/p DCCV 01/12/2012  . Polymyalgia rheumatica ([place])    a. On chronic steroids  . Tubular adenoma of colon 04/2007   Past Surgical History:  Procedure Laterality Date  . ATRIAL FIBRILLATION ABLATION N/A 02/16/2017   Procedure: Atrial Fibrillation Ablation;  Surgeon: Thompson Grayer, MD;  Location: [facility];  Service: Cardiovascular;  Laterality: N/A;  . BREAST LUMPECTOMY  1980's   right  x2 (benign)  . CARDIOVERSION  01/12/2012   Procedure: CARDIOVERSION;  Surgeon: Lelon Perla, MD;  Location: [facility];  Service: Cardiovascular;  Laterality: N/A;  . CARDIOVERSION  03/28/2012   Procedure: CARDIOVERSION;  Surgeon: Larey Dresser, MD;  Location: [facility];  Service: Cardiovascular;   Laterality: N/A;  . CARDIOVERSION  06/29/2012   Procedure: CARDIOVERSION;  Surgeon: Darlin Coco, MD;  Location[facility];  Service: Cardiovascular;  Laterality: N/A;  . CARDIOVERSION N/A 12/07/2015   Procedure: CARDIOVERSION;  Surgeon: Josue Hector, MD;  Location: [facility] ENDOSCOPY;  Service: Cardiovascular;  Laterality: N/A;  . ELECTROPHYSIOLOGIC STUDY N/A 01/19/2016   AFib ablation by Dr Rayann Heman  . GLAUCOMA SURGERY    . TEE WITHOUT CARDIOVERSION N/A 01/19/2016   Procedure: TRANSESOPHAGEAL ECHOCARDIOGRAM (TEE);  Surgeon: Thayer Headings, MD;  Location: [facility] ENDOSCOPY;  Service: Cardiovascular;  Laterality: N/A;  . TONSILLECTOMY  age 31    ROS- all systems are reviewed and negatives except as per HPI above  Current Outpatient Medications  Medication Sig Dispense Refill  . atorvastatin (LIPITOR) 80 MG tablet Take 1 tablet (80 mg total) by mouth daily. 30 tablet 2  . Azelaic Acid (FINACEA) 15 % cream Apply 1 application topically at bedtime. After skin is thoroughly washed and patted dry, gently but thoroughly massage a thin film of azelaic acid cream into the affected area twice daily, in the morning and evening.    . Cholecalciferol (VITAMIN D-3) 5000 UNITS TABS Take 1 tablet by mouth daily.     . Coenzyme Q10 (CO Q-10) 300 MG CAPS Take 300 mg by mouth at bedtime.    Marland Kitchen FERROUS SULFATE PO Take 165 mg by mouth at bedtime.     Marland Kitchen  FIBER PO Take 2 tablets by mouth at bedtime. Chewable    . gabapentin (NEURONTIN) 100 MG capsule TAKE ONE CAPSULE BY MOUTH IN THE MORNING ,1 TABLET MID AFTERNOON & 2 TABLETS AT BEDTIME 360 capsule 0  . losartan (COZAAR) 100 MG tablet TAKE 1 TABLET BY MOUTH EVERY DAY 90 tablet 2  . metoprolol succinate (TOPROL-XL) 25 MG 24 hr tablet TAKE 1/2 TABLET BY MOUTH AS NEEDED FOR AFIB HR OVER 100 30 tablet 1  . omeprazole (PRILOSEC) 20 MG capsule Take 1 capsule (20 mg total) by mouth daily. 90 capsule 3  . OVER THE COUNTER MEDICATION 4 tablets daily. Tart Cherries OTC to help with  arthritis    . Polyvinyl Alcohol-Povidone (REFRESH OP) Place 1 drop into both eyes daily as needed (dry eyes).    . predniSONE (DELTASONE) 1 MG tablet Take 3 mg by mouth daily with breakfast.     . Probiotic Product (PROBIOTIC DAILY PO) Take 1 capsule by mouth daily.    . sodium chloride (OCEAN) 0.65 % SOLN nasal spray Place 1 spray into both nostrils as needed for congestion (As directed).     Alveda Reasons 20 MG TABS tablet TAKE 1 TABLET BY MOUTH DAILY WITH SUPPER 90 tablet 1   No current facility-administered medications for this visit.     Physical Exam: Vitals:   07/27/18 1505  BP: 128/70  Pulse: 95  SpO2: 95%  Weight: 119 lb 6.4 oz (54.2 kg)  Height: 5\' 2"  (1.575 m)    GEN- The patient is well appearing, alert and oriented x 3 today.   Head- normocephalic, atraumatic Eyes-  Sclera clear, conjunctiva pink Ears- hearing intact Oropharynx- clear Lungs- Clear to ausculation bilaterally, normal work of breathing Heart- Regular rate and rhythm, no murmurs, rubs or gallops, PMI not laterally displaced GI- soft, NT, ND, + BS Extremities- no clubbing, cyanosis, or edema  Wt Readings from Last 3 Encounters:  07/27/18 119 lb 6.4 oz (54.2 kg)  07/24/18 123 lb (55.8 kg)  01/29/18 121 lb (54.9 kg)    EKG tracing ordered today is personally reviewed and shows sinus rhythm 95 bpm, PR 194, QRS 72 msec, QTc 449 msec  Assessment and Plan:  1. Paroxysmal atrial fibrillation/ ectopic atrial tachycardia Maintaining sinus rhythm post ablation off AAD therapy chads2vasc score is 4.  Continue xarelto  2. HTN Stable No change required today  3. HL Being managed by lifestyle modification Follows in lipid clinic  Return to see Butch Penny in AF clinic every 6 months  Thompson Grayer MD, [facility] 07/27/2018 3:12 PM

## 2018-07-27 NOTE — Patient Instructions (Addendum)
Medication Instructions:  Your physician recommends that you continue on your current medications as directed. Please refer to the Current Medication list given to you today.  Labwork: None ordered.  Testing/Procedures: None ordered.  Follow-Up: Your physician wants you to follow-up in: 6 months with Roderic Palau NP.   You will receive a reminder letter in the mail two months in advance. If you don't receive a letter, please call our office to schedule the follow-up appointment.  Any Other Special Instructions Will Be Listed Below (If Applicable).  If you need a refill on your cardiac medications before your next appointment, please call your pharmacy.

## 2018-08-04 DIAGNOSIS — Z23 Encounter for immunization: Secondary | ICD-10-CM | POA: Diagnosis not present

## 2018-08-21 ENCOUNTER — Other Ambulatory Visit: Payer: PPO

## 2018-08-21 DIAGNOSIS — E785 Hyperlipidemia, unspecified: Secondary | ICD-10-CM | POA: Diagnosis not present

## 2018-08-21 LAB — LIPID PANEL
CHOL/HDL RATIO: 1.7 ratio (ref 0.0–4.4)
Cholesterol, Total: 146 mg/dL (ref 100–199)
HDL: 86 mg/dL (ref >39)
LDL Calculated: 47 mg/dL (ref 0–99)
Triglycerides: 65 mg/dL (ref 0–149)
VLDL Cholesterol Cal: 13 mg/dL (ref 5–40)

## 2018-08-23 ENCOUNTER — Ambulatory Visit (INDEPENDENT_AMBULATORY_CARE_PROVIDER_SITE_OTHER): Payer: PPO | Admitting: Pharmacist

## 2018-08-23 DIAGNOSIS — E785 Hyperlipidemia, unspecified: Secondary | ICD-10-CM

## 2018-08-23 NOTE — Progress Notes (Signed)
Patient ID: Lorraine Little                 DOB: [birth year]                    MRN: [id number]     HPI: HARBOUR NORDMEYER is a 77 y.o. female patient of Dr. Rayann Heman that presents today for lipid evaluation.  PMH includes HTN, atrial fibrillation, HLD, sinus bradycardia, CVA, GERD, sleep apnea, depression, OA, migraines. She was last seen in clinic by Dr. Rayann Heman on 01/29/18 for a fib and HTN. Patient's pharmacy contacted clinic on 06/01/18 regarding cholesterol medication, however patient's PCP manages her cholesterol. At her most recent lipid visit she preferred to try lifestyle modifications instead of adding ezetimibe.   She presents today for discussion of cholesterol after her most recent cholesterol panel. She has been sort of on the ketodiet with her husband. She has never been a sweet person and does not eat a lot of carbohydrates. She has been using margarine instead of butter for her foods and has tried to lose some weight. She states that her weight has unfortunately not gone down. She states she continues to tolerate her atorvastatin well and will continue on this medication.  Risk Factors: CVA, HTN, HLD, a fib LDL Goal: <70 mg/dL  Current Medications: atorvastatin 80 mg daily  Intolerances: none  Diet: most meals are from home. Eats out 3-4 times a week at [employer]. Mostly eats chicken and seafood, seldomly pork or beef. Her husband is on the keto diet so she has been eating a lot of vegetables. Drinks 1 glass of unsweet decaf tea, and 3-4 cups decaf coffee with Stevia a day. Does drink some water but "not enough".  [place] [facility] [place]water aerobics, for mylagia) for 45 minutes. Gardens and does yardwork  Family History: Mother- HF, Father- 19  Social History: never smoker, rarely drinks 1 glass of wine   Labs: 08/21/18:  TC 146, TG 65, HDL 86, LDL 47 (atorvastatin 80mg  daily with dietary modifications) 04/12/2018 TC 160, TG 55, HDL  78, LDL 71 (atorvastatin 80mg  daily)  Past Medical History:  Diagnosis Date  . Adrenal insufficiency (HCC)    takes prednisone 5mg  daily  . Cervical disc disease   . CVA (cerebral infarction)    a. 11/2011  . Depression   . Diverticulosis   . GERD (gastroesophageal reflux disease)    pt denies symptoms with this  . Glaucoma   . Hyperlipidemia    a. Diagnosed 12/2011  . Hypertension   . Insomnia with sleep apnea   . Internal hemorrhoid   . Migraine headache   . Osteoarthritis   . Osteoporosis   . Persistent atrial fibrillation ([place])    a.  Diagnosed 07/2011;  b. Normal nuclear myoview 07/2011 and normal EF at that time;  c. Most recent echo 2/20 /13 - EF 55-60% wit mild-mod MR.;  d. s/p DCCV 01/12/2012  . Polymyalgia rheumatica ([place])    a. On chronic steroids  . Tubular adenoma of colon 04/2007    Current Outpatient Medications on File Prior to Visit  Medication Sig Dispense Refill  . atorvastatin (LIPITOR) 80 MG tablet Take 1 tablet (80 mg total) by mouth daily. 30 tablet 2  . Azelaic Acid (FINACEA) 15 % cream Apply 1 application topically at bedtime. After skin is thoroughly washed and patted dry, gently but thoroughly massage a thin film of azelaic acid cream  into the affected area twice daily, in the morning and evening.    . Cholecalciferol (VITAMIN D-3) 5000 UNITS TABS Take 1 tablet by mouth daily.     . Coenzyme Q10 (CO Q-10) 300 MG CAPS Take 300 mg by mouth at bedtime.    Marland Kitchen FERROUS SULFATE PO Take 165 mg by mouth at bedtime.     Marland Kitchen FIBER PO Take 2 tablets by mouth at bedtime. Chewable    . gabapentin (NEURONTIN) 100 MG capsule TAKE ONE CAPSULE BY MOUTH IN THE MORNING ,1 TABLET MID AFTERNOON & 2 TABLETS AT BEDTIME 360 capsule 0  . losartan (COZAAR) 100 MG tablet TAKE 1 TABLET BY MOUTH EVERY DAY 90 tablet 2  . metoprolol succinate (TOPROL-XL) 25 MG 24 hr tablet TAKE 1/2 TABLET BY MOUTH AS NEEDED FOR AFIB HR OVER 100 30 tablet 1  . omeprazole (PRILOSEC) 20 MG capsule Take 1  capsule (20 mg total) by mouth daily. 90 capsule 3  . OVER THE COUNTER MEDICATION 4 tablets daily. Tart Cherries OTC to help with arthritis    . Polyvinyl Alcohol-Povidone (REFRESH OP) Place 1 drop into both eyes daily as needed (dry eyes).    . predniSONE (DELTASONE) 1 MG tablet Take 3 mg by mouth daily with breakfast.     . Probiotic Product (PROBIOTIC DAILY PO) Take 1 capsule by mouth daily.    . sodium chloride (OCEAN) 0.65 % SOLN nasal spray Place 1 spray into both nostrils as needed for congestion (As directed).     Alveda Reasons 20 MG TABS tablet TAKE 1 TABLET BY MOUTH DAILY WITH SUPPER 90 tablet 1  . [DISCONTINUED] flecainide (TAMBOCOR) 100 MG tablet Take 100mg  1 tablet prn onset of atrial fib. 30 tablet 6   No current facility-administered medications on file prior to visit.     No Known Allergies  Assessment/Plan:  1. Hyperlipidemia -  LDL now at goal with lifestyle modifications and atorvastatin 80mg  daily. Continue medications and lifestyle modifications. Follow up with Dr. Aggie Cosier as scheduled and lipid clinic as needed.    Thanks, Tana Coast, PharmD

## 2018-08-23 NOTE — Patient Instructions (Addendum)
CONGRATULATIONS - Your cholesterol is now at your goal!!! Your bad cholesterol (LDL) is less than 70.  Keep up the good work with your diet and exercise!   Continue to take atorvastatin 80mg  daily.   Follow up with Dr. Rayann Heman as scheduled and lipid clinic if needed   Cholesterol Cholesterol is a fat. Your body needs a small amount of cholesterol. Cholesterol (plaque) may build up in your blood vessels (arteries). That makes you more likely to have a heart attack or stroke. You cannot feel your cholesterol level. Having a blood test is the only way to find out if your level is high. Keep your test results. Work with your doctor to keep your cholesterol at a good level. What do the results mean?  Total cholesterol is how much cholesterol is in your blood.  LDL is bad cholesterol. This is the type that can build up. Try to have low LDL.  HDL is good cholesterol. It cleans your blood vessels and carries LDL away. Try to have high HDL.  Triglycerides are fat that the body can store or burn for energy. What are good levels of cholesterol?  Total cholesterol below 200.  LDL below 100 is good for people who have health risks. LDL below 70 is good for people who have very high risks.  HDL above 40 is good. It is best to have HDL of 60 or higher.  Triglycerides below 150. How can I lower my cholesterol? Diet Follow your diet program as told by your doctor.  Choose fish, white meat chicken, or [place] that is roasted or baked. Try not to eat red meat, fried foods, sausage, or lunch meats.  Eat lots of fresh fruits and vegetables.  Choose whole grains, beans, pasta, potatoes, and cereals.  Choose olive oil, corn oil, or canola oil. Only use small amounts.  Try not to eat butter, mayonnaise, shortening, or palm kernel oils.  Try not to eat foods with trans fats.  Choose low-fat or nonfat dairy foods. ? Drink skim or nonfat milk. ? Eat low-fat or nonfat yogurt and cheeses. ? Try not  to drink whole milk or cream. ? Try not to eat ice cream, egg yolks, or full-fat cheeses.  Healthy desserts include angel food cake, ginger snaps, animal crackers, hard candy, popsicles, and low-fat or nonfat frozen yogurt. Try not to eat pastries, cakes, pies, and cookies.  Exercise Follow your exercise program as told by your doctor.  Be more active. Try gardening, walking, and taking the stairs.  Ask your doctor about ways that you can be more active.  Medicine  Take over-the-counter and prescription medicines only as told by your doctor. This information is not intended to replace advice given to you by your health care provider. Make sure you discuss any questions you have with your health care provider. Document Released: 01/13/2009 Document Revised: 05/18/2016 Document Reviewed: 04/28/2016 Elsevier Interactive Patient Education  [employer].

## 2018-09-04 ENCOUNTER — Other Ambulatory Visit: Payer: Self-pay | Admitting: Internal Medicine

## 2018-09-05 ENCOUNTER — Encounter: Payer: Self-pay | Admitting: Podiatry

## 2018-09-05 ENCOUNTER — Ambulatory Visit: Payer: PPO | Admitting: Podiatry

## 2018-09-05 VITALS — BP 153/83 | HR 97

## 2018-09-05 DIAGNOSIS — L600 Ingrowing nail: Secondary | ICD-10-CM

## 2018-09-09 NOTE — Progress Notes (Signed)
   Subjective: Patient presents today for evaluation of pain to the left hallux that began a few years ago. She also reports possible nail fungus of bilateral great toes. Patient is concerned for possible ingrown nail of the left great toenail. Wearing shoes or applying pressure to the area increases the pain. She has not done anything for treatment. She states she had the right great toenail removed in the past but believes the fungus came back. Patient presents today for further treatment and evaluation.  Past Medical History:  Diagnosis Date  . Adrenal insufficiency (HCC)    takes prednisone 5mg  daily  . Cervical disc disease   . CVA (cerebral infarction)    a. 11/2011  . Depression   . Diverticulosis   . GERD (gastroesophageal reflux disease)    pt denies symptoms with this  . Glaucoma   . Hyperlipidemia    a. Diagnosed 12/2011  . Hypertension   . Insomnia with sleep apnea   . Internal hemorrhoid   . Migraine headache   . Osteoarthritis   . Osteoporosis   . Persistent atrial fibrillation    a.  Diagnosed 07/2011;  b. Normal nuclear myoview 07/2011 and normal EF at that time;  c. Most recent echo 2/20 /13 - EF 55-60% wit mild-mod MR.;  d. s/p DCCV 01/12/2012  . Polymyalgia rheumatica ([place])    a. On chronic steroids  . Tubular adenoma of colon 04/2007    Objective:  General: Well developed, nourished, in no acute distress, alert and oriented x3   Dermatology: Skin is warm, dry and supple bilateral. Left hallux appears to be erythematous with evidence of an ingrowing nail. Pain on palpation noted to the border of the nail fold. The remaining nails appear unremarkable at this time. There are no open sores, lesions.  Vascular: Dorsalis Pedis artery and Posterior Tibial artery pedal pulses palpable. No lower extremity edema noted.   Neruologic: Grossly intact via light touch bilateral.  Musculoskeletal: Muscular strength within normal limits in all groups bilateral. Normal range of  motion noted to all pedal and ankle joints.   Assesement: #1 Paronychia with ingrowing nail left hallux  #2 Pain in toe #3 Incurvated nail  Plan of Care:  1. Patient evaluated.  2. Mechanical debridement of the left great toenail performed using a nail nipper. Filed with dremel without incident.  3. Recommended laser treatment as an option.  4. Return to clinic as needed.    Edrick Kins, DPM [facility]  Dr. Edrick Kins, [address]                                        [place], [place] [address]                Office [phone]  Fax [phone]

## 2018-10-12 ENCOUNTER — Other Ambulatory Visit: Payer: Self-pay | Admitting: Neurology

## 2018-11-07 ENCOUNTER — Other Ambulatory Visit (HOSPITAL_COMMUNITY): Payer: Self-pay | Admitting: Nurse Practitioner

## 2018-12-17 ENCOUNTER — Other Ambulatory Visit: Payer: Self-pay | Admitting: Internal Medicine

## 2018-12-17 NOTE — Telephone Encounter (Signed)
CrC 41mL/min, SCr in [facility] is 0.8 from June 2019.

## 2018-12-21 ENCOUNTER — Other Ambulatory Visit (HOSPITAL_COMMUNITY): Payer: Self-pay | Admitting: Nurse Practitioner

## 2018-12-28 NOTE — Progress Notes (Signed)
[facility]  PATIENT: Lorraine Little DOB: Nov 24, 1940   REASON FOR VISIT: Follow-up for obstructive sleep apnea with CPAP compliance, cluster headaches HISTORY FROM: Patient    HISTORY OF PRESENT ILLNESS:UPDATE 3/2/2020CM Lorraine Little, 78 year old female returns for follow-up with history of obstructive sleep apnea here for CPAP compliance.  She also has a history of atrial fibrillation status post 2 ablations.  CPAP compliance data 11/30/2018-12/29/2018 shows compliance greater than 4 hours at 97%.  Average usage 7 hours 22 minutes.  Set pressure 5 to 10 cm.  EPR level 1 leak 95th percentile 37.4 AHI 0.5 ESS 6.  She also has a history of headaches for which she takes gabapentin with relief.  She describes them as a dull headache.  No recent falls gait exam is normal.  She returns for reevaluation   9/24/19CDInterval history from 24 July 2018, I have the pleasure of seeing Lorraine Little today for her yearly revisit, she still suffers from atrial fibrillation, palpitation and is status post 2 ablations by now.  Her CPAP experience has become better and she is now 100% compliant over the last 90 days, with an average use at time of 7 hours 33 minutes on an AutoSet CPAP which allows a pressure window between 5 and 10 cmH2O was 1 cm EPR, residual AHI is 0.4 which is an excellent resolution, she does not have Cheyne-Stokes respirations, the 95th percentile pressure is at 9.9 cmH2O and she does have some moderate air leakage which she attributes to oral air leaking.  She may try to use a chinstrap again if she wants to, but I think that given the low residual apnea count she is actually well treated without.  REVIEW OF SYSTEMS: Full 14 system review of systems performed and notable only for those listed, all others are neg:  Constitutional: neg  Cardiovascular: neg Ear/Nose/Throat: neg  Skin: neg Eyes: Blurred vision  Respiratory: neg Gastroitestinal: neg  Hematology/Lymphatic:  neg  Endocrine: Intolerance to cold Musculoskeletal:neg Allergy/Immunology: neg Neurological: Headache Psychiatric: neg Sleep : Obstructive sleep apnea with CPAP   ALLERGIES: No Known Allergies  HOME MEDICATIONS: Outpatient Medications Prior to Visit  Medication Sig Dispense Refill  . atorvastatin (LIPITOR) 80 MG tablet TAKE 1 TABLET BY MOUTH EVERY DAY 90 tablet 3  . Azelaic Acid (FINACEA) 15 % cream Apply 1 application topically at bedtime. After skin is thoroughly washed and patted dry, gently but thoroughly massage a thin film of azelaic acid cream into the affected area twice daily, in the morning and evening.    . Cholecalciferol (VITAMIN D-3) 5000 UNITS TABS Take 1 tablet by mouth daily.     . Coenzyme Q10 (CO Q-10) 300 MG CAPS Take 300 mg by mouth at bedtime.    Marland Kitchen FERROUS SULFATE PO Take 165 mg by mouth at bedtime.     Marland Kitchen FIBER PO Take 2 tablets by mouth at bedtime. Chewable    . gabapentin (NEURONTIN) 100 MG capsule TAKE ONE CAPSULE BY MOUTH IN THE MORNING ,1 TABLET MID AFTERNOON & 2 TABLETS AT BEDTIME 360 capsule 1  . losartan (COZAAR) 100 MG tablet TAKE 1 TABLET BY MOUTH EVERY DAY 90 tablet 2  . metoprolol succinate (TOPROL-XL) 25 MG 24 hr tablet TAKE 1/2 TABLET BY MOUTH AS NEEDED FOR AFIB HR OVER 100 (Patient taking differently: Take 12.5 mg by mouth daily. Take 1/2 tablet by mouth for afib HR over 100) 30 tablet 1  . omeprazole (PRILOSEC) 20 MG capsule Take 1 capsule (20  mg total) by mouth daily. 90 capsule 3  . OVER THE COUNTER MEDICATION 4 tablets daily. Tart Cherries OTC to help with arthritis    . Polyvinyl Alcohol-Povidone (REFRESH OP) Place 1 drop into both eyes daily as needed (dry eyes).    . predniSONE (DELTASONE) 1 MG tablet Take 3 mg by mouth daily with breakfast.     . Probiotic Product (PROBIOTIC DAILY PO) Take 1 capsule by mouth daily.    . sodium chloride (OCEAN) 0.65 % SOLN nasal spray Place 1 spray into both nostrils as needed for congestion (As directed).       Alveda Reasons 20 MG TABS tablet TAKE 1 TABLET BY MOUTH DAILY WITH SUPPER 90 tablet 1   No facility-administered medications prior to visit.     PAST MEDICAL HISTORY: Past Medical History:  Diagnosis Date  . Adrenal insufficiency (HCC)    takes prednisone 5mg  daily  . Cervical disc disease   . CVA (cerebral infarction)    a. 11/2011  . Depression   . Diverticulosis   . GERD (gastroesophageal reflux disease)    pt denies symptoms with this  . Glaucoma   . Hyperlipidemia    a. Diagnosed 12/2011  . Hypertension   . Insomnia with sleep apnea   . Internal hemorrhoid   . Migraine headache   . Osteoarthritis   . Osteoporosis   . Persistent atrial fibrillation    a.  Diagnosed 07/2011;  b. Normal nuclear myoview 07/2011 and normal EF at that time;  c. Most recent echo 2/20 /13 - EF 55-60% wit mild-mod MR.;  d. s/p DCCV 01/12/2012  . Polymyalgia rheumatica ([place])    a. On chronic steroids  . Tubular adenoma of colon 04/2007    PAST SURGICAL HISTORY: Past Surgical History:  Procedure Laterality Date  . ATRIAL FIBRILLATION ABLATION N/A 02/16/2017   Procedure: Atrial Fibrillation Ablation;  Surgeon: Thompson Grayer, MD;  Location: [facility];  Service: Cardiovascular;  Laterality: N/A;  . BREAST LUMPECTOMY  1980's   right  x2 (benign)  . CARDIOVERSION  01/12/2012   Procedure: CARDIOVERSION;  Surgeon: Lelon Perla, MD;  Location: [facility];  Service: Cardiovascular;  Laterality: N/A;  . CARDIOVERSION  03/28/2012   Procedure: CARDIOVERSION;  Surgeon: Larey Dresser, MD;  Location: [facility];  Service: Cardiovascular;  Laterality: N/A;  . CARDIOVERSION  06/29/2012   Procedure: CARDIOVERSION;  Surgeon: Darlin Coco, MD;  Location: [facility] ENDOSCOPY;  Service: Cardiovascular;  Laterality: N/A;  . CARDIOVERSION N/A 12/07/2015   Procedure: CARDIOVERSION;  Surgeon: Josue Hector, MD;  Location: [facility] ENDOSCOPY;  Service: Cardiovascular;  Laterality: N/A;  . ELECTROPHYSIOLOGIC STUDY N/A 01/19/2016   AFib  ablation by Dr Rayann Heman  . GLAUCOMA SURGERY    . TEE WITHOUT CARDIOVERSION N/A 01/19/2016   Procedure: TRANSESOPHAGEAL ECHOCARDIOGRAM (TEE);  Surgeon: Thayer Headings, MD;  Location: [facility] ENDOSCOPY;  Service: Cardiovascular;  Laterality: N/A;  . TONSILLECTOMY  age 56    FAMILY HISTORY: Family History  Problem Relation Age of Onset  . Stroke Mother        Deceased @ 49  . Heart failure Mother        died from  . Liver cancer Father        Deceased @ 28  . Colon cancer Paternal Grandmother     SOCIAL HISTORY: Social History   Socioeconomic History  . Marital status: Married    Spouse name: [name]  . Number of children: 3  . Years  of education: college  . Highest education level: Not on file  Occupational History  . Occupation: retired     Comment: Pharmacist, hospital , [place]  Social Needs  . Financial resource strain: Not on file  . Food insecurity:    Worry: Not on file    Inability: Not on file  . Transportation needs:    Medical: Not on file    Non-medical: Not on file  Tobacco Use  . Smoking status: Never Smoker  . Smokeless tobacco: Never Used  Substance and Sexual Activity  . Alcohol use: Yes    Alcohol/week: 1.0 standard drinks    Types: 1 Glasses of wine per week    Comment: rare  . Drug use: No  . Sexual activity: Not Currently  Lifestyle  . Physical activity:    Days per week: Not on file    Minutes per session: Not on file  . Stress: Not on file  Relationships  . Social connections:    Talks on phone: Not on file    Gets together: Not on file    Attends religious service: Not on file    Active member of club or organization: Not on file    Attends meetings of clubs or organizations: Not on file    Relationship status: Not on file  . Intimate partner violence:    Fear of current or ex partner: Not on file    Emotionally abused: Not on file    Physically abused: Not on file    Forced sexual activity: Not on file  Other Topics Concern  . Not on file  Social  History Narrative   Pt lives in [place] with spouse.  [name])  She was previously a Education officer, museum.    Caffeine- None   Right handed   Patient has three children.   Patient has a college education.     PHYSICAL EXAM  Vitals:   12/31/18 1449  BP: 121/68  Pulse: 90  Weight: 123 lb (55.8 kg)  Height: 5\' 2"  (1.575 m)   Body mass index is 22.5 kg/m.  Generalized: Well developed, in no acute distress  Head: normocephalic and atraumatic,. Oropharynx benign mallopatti2 Neck: Supple, circumference 14 Cardiac: Regular rate rhythm, no murmur  Lungs clear Musculoskeletal: No deformity  Skin no rash or edema Neurological examination   Mentation: Alert oriented to time, place, history taking. Attention span and concentration appropriate. Recent and remote memory intact.  Follows all commands speech and language fluent.   Cranial nerve II-XII: Pupils were equal round reactive to light extraocular movements were full, visual field were full on confrontational test. Facial sensation and strength were normal. hearing was intact to finger rubbing bilaterally. Uvula tongue midline. head turning and shoulder shrug were normal and symmetric.Tongue protrusion into cheek strength was normal. Motor: normal bulk and tone, full strength in the BUE, BLE,  Sensory: normal and symmetric to light touch,  Coordination: finger-nose-finger, heel-to-shin bilaterally, no dysmetria Gait and Station: Rising up from seated position without assistance, normal stance,  moderate stride, good arm swing, smooth turning, able to perform tiptoe, and heel walking without difficulty. Tandem gait is steady.  No assistive device  DIAGNOSTIC DATA (LABS, IMAGING, TESTING) - I reviewed patient records, labs, notes, testing and imaging myself where available.  Lab Results  Component Value Date   WBC 6.5 02/06/2017   HGB 12.6 02/06/2017   HCT 37.9 02/06/2017   MCV 92 02/06/2017   PLT 229 02/06/2017      Component  Value Date/Time   NA 142 02/06/2017 1442   K 4.4 02/06/2017 1442   CL 100 02/06/2017 1442   CO2 30 (H) 02/06/2017 1442   GLUCOSE 88 02/06/2017 1442   GLUCOSE 104 (H) 06/23/2016 1145   BUN 16 02/06/2017 1442   CREATININE 0.77 02/06/2017 1442   CREATININE 1.15 (H) 01/12/2016 1247   CALCIUM 9.4 02/06/2017 1442   PROT 6.3 (L) 08/04/2015 1415   ALBUMIN 3.9 08/04/2015 1415   AST 34 08/04/2015 1415   ALT 23 08/04/2015 1415   ALKPHOS 72 08/04/2015 1415   BILITOT 0.9 08/04/2015 1415   GFRNONAA 75 02/06/2017 1442   GFRAA 86 02/06/2017 1442   Lab Results  Component Value Date   CHOL 146 08/21/2018   HDL 86 08/21/2018   LDLCALC 47 08/21/2018   TRIG 65 08/21/2018   CHOLHDL 1.7 08/21/2018   Lab Results  Component Value Date   HGBA1C 5.8 (H) 12/21/2011   No results found for: VITAMINB12 Lab Results  Component Value Date   TSH 0.302 (L) 06/23/2016      ASSESSMENT AND PLAN  78 y.o. year old female  has a past medical history of Adrenal insufficiency ([place]), Cervical disc disease, CVA (cerebral infarction), Depression, Diverticulosis, GERD (gastroesophageal reflux disease), Glaucoma, Hyperlipidemia, Hypertension, Insomnia with sleep apnea, Internal hemorrhoid, Migraine headache, Osteoarthritis, Osteoporosis, Persistent atrial fibrillation, Polymyalgia rheumatica ([place]), and Tubular adenoma of colon (04/2007). here to follow-up for obstructive sleep apnea.  She has 97% compliance.  She has a leak in her mask but it is time to change her mask and she says she will do that tonight.  Gabapentin has worked well for her headaches   PLAN: CPAP compliance 97% greater than 4 hours Continue same settings Need to change your  Mask Continue gabapentin at current dose Continue water aerobics 3 times a week Follow-up in 6 months Dennie Bible, [facility], [facility], APRN  [facility] [address], [place], [place] [address] [phone]

## 2018-12-31 ENCOUNTER — Ambulatory Visit: Payer: PPO | Admitting: Nurse Practitioner

## 2018-12-31 ENCOUNTER — Encounter: Payer: Self-pay | Admitting: Nurse Practitioner

## 2018-12-31 VITALS — BP 121/68 | HR 90 | Ht 62.0 in | Wt 123.0 lb

## 2018-12-31 DIAGNOSIS — G4733 Obstructive sleep apnea (adult) (pediatric): Secondary | ICD-10-CM

## 2018-12-31 DIAGNOSIS — Z9989 Dependence on other enabling machines and devices: Secondary | ICD-10-CM | POA: Diagnosis not present

## 2018-12-31 NOTE — Patient Instructions (Signed)
CPAP compliance 97% greater than 4 hours Continue same settings Need to change your  Mask Continue gabapentin at current dose Follow-up in 6 months

## 2019-01-05 ENCOUNTER — Other Ambulatory Visit (HOSPITAL_COMMUNITY): Payer: Self-pay | Admitting: Nurse Practitioner

## 2019-01-08 ENCOUNTER — Other Ambulatory Visit (HOSPITAL_COMMUNITY): Payer: Self-pay | Admitting: Nurse Practitioner

## 2019-01-08 MED ORDER — METOPROLOL SUCCINATE ER 25 MG PO TB24: 12.5000 mg | ORAL_TABLET | Freq: Every day | ORAL | 1 refills | Status: DC

## 2019-01-08 NOTE — Addendum Note (Signed)
Addended by: Juluis Mire on: 01/08/2019 11:32 AM   Modules accepted: Orders

## 2019-01-10 DIAGNOSIS — G4733 Obstructive sleep apnea (adult) (pediatric): Secondary | ICD-10-CM | POA: Diagnosis not present

## 2019-01-22 ENCOUNTER — Ambulatory Visit: Payer: PPO | Admitting: Nurse Practitioner

## 2019-01-24 ENCOUNTER — Ambulatory Visit (HOSPITAL_COMMUNITY): Payer: PPO | Admitting: Nurse Practitioner

## 2019-02-01 ENCOUNTER — Other Ambulatory Visit: Payer: Self-pay | Admitting: Internal Medicine

## 2019-03-19 DIAGNOSIS — H25011 Cortical age-related cataract, right eye: Secondary | ICD-10-CM | POA: Diagnosis not present

## 2019-03-19 DIAGNOSIS — H40033 Anatomical narrow angle, bilateral: Secondary | ICD-10-CM | POA: Diagnosis not present

## 2019-03-19 DIAGNOSIS — H2511 Age-related nuclear cataract, right eye: Secondary | ICD-10-CM | POA: Diagnosis not present

## 2019-03-19 DIAGNOSIS — Z961 Presence of intraocular lens: Secondary | ICD-10-CM | POA: Diagnosis not present

## 2019-04-29 ENCOUNTER — Other Ambulatory Visit: Payer: Self-pay | Admitting: Neurology

## 2019-05-30 DIAGNOSIS — R82998 Other abnormal findings in urine: Secondary | ICD-10-CM | POA: Diagnosis not present

## 2019-05-30 DIAGNOSIS — M810 Age-related osteoporosis without current pathological fracture: Secondary | ICD-10-CM | POA: Diagnosis not present

## 2019-05-30 DIAGNOSIS — I10 Essential (primary) hypertension: Secondary | ICD-10-CM | POA: Diagnosis not present

## 2019-05-30 DIAGNOSIS — E7849 Other hyperlipidemia: Secondary | ICD-10-CM | POA: Diagnosis not present

## 2019-06-04 DIAGNOSIS — I6340 Cerebral infarction due to embolism of unspecified cerebral artery: Secondary | ICD-10-CM | POA: Diagnosis not present

## 2019-06-04 DIAGNOSIS — D649 Anemia, unspecified: Secondary | ICD-10-CM | POA: Diagnosis not present

## 2019-06-04 DIAGNOSIS — Z1331 Encounter for screening for depression: Secondary | ICD-10-CM | POA: Diagnosis not present

## 2019-06-04 DIAGNOSIS — N390 Urinary tract infection, site not specified: Secondary | ICD-10-CM | POA: Diagnosis not present

## 2019-06-04 DIAGNOSIS — M353 Polymyalgia rheumatica: Secondary | ICD-10-CM | POA: Diagnosis not present

## 2019-06-04 DIAGNOSIS — R946 Abnormal results of thyroid function studies: Secondary | ICD-10-CM | POA: Diagnosis not present

## 2019-06-04 DIAGNOSIS — R159 Full incontinence of feces: Secondary | ICD-10-CM | POA: Diagnosis not present

## 2019-06-04 DIAGNOSIS — I480 Paroxysmal atrial fibrillation: Secondary | ICD-10-CM | POA: Diagnosis not present

## 2019-06-04 DIAGNOSIS — I10 Essential (primary) hypertension: Secondary | ICD-10-CM | POA: Diagnosis not present

## 2019-06-04 DIAGNOSIS — L509 Urticaria, unspecified: Secondary | ICD-10-CM | POA: Diagnosis not present

## 2019-06-04 DIAGNOSIS — R3915 Urgency of urination: Secondary | ICD-10-CM | POA: Diagnosis not present

## 2019-06-04 DIAGNOSIS — Z0000 Encounter for general adult medical examination without abnormal findings: Secondary | ICD-10-CM | POA: Diagnosis not present

## 2019-06-04 DIAGNOSIS — K921 Melena: Secondary | ICD-10-CM | POA: Diagnosis not present

## 2019-06-12 ENCOUNTER — Other Ambulatory Visit: Payer: Self-pay | Admitting: Internal Medicine

## 2019-06-12 NOTE — Telephone Encounter (Signed)
Pt last saw Dr Rayann Heman 07/27/18, last labs 05/30/19 Creat 0.8 at [facility] per [facility], age 78, weight 55.8kg, CrCl 49.41, based on CrCl pt is not on appropriate dosage of Xarelto. CrCl 15-50 should be 15mg  QD.  Will send staff message to Dr Rayann Heman to see if he would like to reduce pt's current dosage of Xarelto.  Will await response to refill rx.

## 2019-06-18 NOTE — Telephone Encounter (Signed)
-----   Message from Thompson Grayer, MD sent at 06/13/2019  7:44 AM EDT ----- Continue current dose unless CrCl drops further  ----- Message ----- From: Brynda Peon, RN Sent: 06/12/2019   8:21 AM EDT To: Thompson Grayer, MD  Pt last saw Dr Rayann Heman 07/27/18, last labs 05/30/19 Creat 0.8 at [facility] per [facility], age 78, weight 55.8kg, CrCl 49.41, based on CrCl pt is not on appropriate dosage of Xarelto. CrCl 15-50 should be 15mg  QD.  Will send staff message to Dr Rayann Heman to see if he would like to reduce pt's current dosage of Xarelto.  Will await response to refill rx.

## 2019-06-18 NOTE — Telephone Encounter (Signed)
Will refill Xarelto 20mg  QD per Dr Rayann Heman.

## 2019-07-09 ENCOUNTER — Encounter: Payer: Self-pay | Admitting: Adult Health

## 2019-07-11 ENCOUNTER — Other Ambulatory Visit: Payer: Self-pay

## 2019-07-11 ENCOUNTER — Ambulatory Visit (INDEPENDENT_AMBULATORY_CARE_PROVIDER_SITE_OTHER): Payer: PPO | Admitting: Adult Health

## 2019-07-11 ENCOUNTER — Encounter: Payer: Self-pay | Admitting: Adult Health

## 2019-07-11 VITALS — BP 135/65 | HR 85 | Temp 97.3°F | Ht 62.0 in | Wt 123.6 lb

## 2019-07-11 DIAGNOSIS — G4489 Other headache syndrome: Secondary | ICD-10-CM | POA: Diagnosis not present

## 2019-07-11 DIAGNOSIS — G4733 Obstructive sleep apnea (adult) (pediatric): Secondary | ICD-10-CM

## 2019-07-11 DIAGNOSIS — Z9989 Dependence on other enabling machines and devices: Secondary | ICD-10-CM | POA: Diagnosis not present

## 2019-07-11 NOTE — Patient Instructions (Addendum)
Continue using CPAP nightly and greater than 4 hours each night Continue to monitor headaches. If headache frequency increases please let me know If your symptoms worsen or you develop new symptoms please let us know.

## 2019-07-11 NOTE — Progress Notes (Signed)
PATIENT: Lorraine Little DOB: 12-29-1940  REASON FOR VISIT: follow up HISTORY FROM: patient  HISTORY OF PRESENT ILLNESS: Today 07/11/19:  Lorraine Little is an 78 year old female with a history of obstructive sleep apnea on CPAP.  She returns today for follow-up.  Her download indicates that she used her machine nightly for compliance of 100%.  Every night she use her machine greater than 4 hours.  On average she uses her machine 7 hours and 33 minutes.  Her residual AHI is 0.5 on 5 to 10 cm of water with an EPR of 1.  Her leak in the 95th percentile is 37.  She reports that her headache frequency did increase for the month of July.  She states in June she had 2 occasions where she had to take an extra gabapentin in July she had 6 occasions and in August back down to 3.  She states that her headaches typically occur in the temporal regions bilaterally.  She states that her headaches are different than her migraine headaches that she used to have.  She denies any visual changes.  She returns today for evaluation.  HISTORY 3/2/2020CM Lorraine Little, 78 year old female returns for follow-up with history of obstructive sleep apnea here for CPAP compliance.  She also has a history of atrial fibrillation status post 2 ablations.  CPAP compliance data 11/30/2018-12/29/2018 shows compliance greater than 4 hours at 97%.  Average usage 7 hours 22 minutes.  Set pressure 5 to 10 cm.  EPR level 1 leak 95th percentile 37.4 AHI 0.5 ESS 6.  She also has a history of headaches for which she takes gabapentin with relief.  She describes them as a dull headache.  No recent falls gait exam is normal.  She returns for reevaluation   REVIEW OF SYSTEMS: Out of a complete 14 system review of symptoms, the patient complains only of the following symptoms, and all other reviewed systems are negative.  Fatigue severity score 36 Epworth sleepiness score 4 ALLERGIES: No Known Allergies  HOME MEDICATIONS: Outpatient Medications  Prior to Visit  Medication Sig Dispense Refill  . atorvastatin (LIPITOR) 80 MG tablet TAKE 1 TABLET BY MOUTH EVERY DAY 90 tablet 3  . Azelaic Acid (FINACEA) 15 % cream Apply 1 application topically at bedtime. After skin is thoroughly washed and patted dry, gently but thoroughly massage a thin film of azelaic acid cream into the affected area twice daily, in the morning and evening.    . Cholecalciferol (VITAMIN D-3) 5000 UNITS TABS Take 1 tablet by mouth daily.     . Coenzyme Q10 (CO Q-10) 300 MG CAPS Take 300 mg by mouth at bedtime.    Marland Kitchen FERROUS SULFATE PO Take 165 mg by mouth at bedtime.     Marland Kitchen FIBER PO Take 2 tablets by mouth at bedtime. Chewable    . gabapentin (NEURONTIN) 100 MG capsule TAKE ONE CAPSULE BY MOUTH IN THE MORNING ,1 TABLET MID AFTERNOON & 2 TABLETS AT BEDTIME 360 capsule 1  . losartan (COZAAR) 100 MG tablet TAKE 1 TABLET BY MOUTH EVERY DAY 90 tablet 2  . metoprolol succinate (TOPROL-XL) 25 MG 24 hr tablet Take 0.5 tablets (12.5 mg total) by mouth daily. Take 1/2 tablet by mouth for afib HR over 100 45 tablet 1  . omeprazole (PRILOSEC) 20 MG capsule Take 1 capsule (20 mg total) by mouth daily. 90 capsule 3  . OVER THE COUNTER MEDICATION 4 tablets daily. Tart Cherries OTC to help with arthritis    .  Polyvinyl Alcohol-Povidone (REFRESH OP) Place 1 drop into both eyes daily as needed (dry eyes).    . predniSONE (DELTASONE) 1 MG tablet Take 3 mg by mouth daily with breakfast.     . Probiotic Product (PROBIOTIC DAILY PO) Take 1 capsule by mouth daily.    . sodium chloride (OCEAN) 0.65 % SOLN nasal spray Place 1 spray into both nostrils as needed for congestion (As directed).     Alveda Reasons 20 MG TABS tablet TAKE 1 TABLET BY MOUTH DAILY WITH SUPPER 90 tablet 1   No facility-administered medications prior to visit.     PAST MEDICAL HISTORY: Past Medical History:  Diagnosis Date  . Adrenal insufficiency (HCC)    takes prednisone 5mg  daily  . Cervical disc disease   . CVA  (cerebral infarction)    a. 11/2011  . Depression   . Diverticulosis   . GERD (gastroesophageal reflux disease)    pt denies symptoms with this  . Glaucoma   . Hyperlipidemia    a. Diagnosed 12/2011  . Hypertension   . Insomnia with sleep apnea   . Internal hemorrhoid   . Migraine headache   . Osteoarthritis   . Osteoporosis   . Persistent atrial fibrillation    a.  Diagnosed 07/2011;  b. Normal nuclear myoview 07/2011 and normal EF at that time;  c. Most recent echo 2/20 /13 - EF 55-60% wit mild-mod MR.;  d. s/p DCCV 01/12/2012  . Polymyalgia rheumatica ([place])    a. On chronic steroids  . Tubular adenoma of colon 04/2007    PAST SURGICAL HISTORY: Past Surgical History:  Procedure Laterality Date  . ATRIAL FIBRILLATION ABLATION N/A 02/16/2017   Procedure: Atrial Fibrillation Ablation;  Surgeon: Thompson Grayer, MD;  Location: [place] [facility];  Service: Cardiovascular;  Laterality: N/A;  . BREAST LUMPECTOMY  1980's   right  x2 (benign)  . CARDIOVERSION  01/12/2012   Procedure: CARDIOVERSION;  Surgeon: Lelon Perla, MD;  Location: [place];  Service: Cardiovascular;  Laterality: N/A;  . CARDIOVERSION  03/28/2012   Procedure: CARDIOVERSION;  Surgeon: Larey Dresser, MD;  Location: [place];  Service: Cardiovascular;  Laterality: N/A;  . CARDIOVERSION  06/29/2012   Procedure: CARDIOVERSION;  Surgeon: Darlin Coco, MD;  Location: [facility] ENDOSCOPY;  Service: Cardiovascular;  Laterality: N/A;  . CARDIOVERSION N/A 12/07/2015   Procedure: CARDIOVERSION;  Surgeon: Josue Hector, MD;  Location: [facility] ENDOSCOPY;  Service: Cardiovascular;  Laterality: N/A;  . ELECTROPHYSIOLOGIC STUDY N/A 01/19/2016   AFib ablation by Dr Rayann Heman  . GLAUCOMA SURGERY    . TEE WITHOUT CARDIOVERSION N/A 01/19/2016   Procedure: TRANSESOPHAGEAL ECHOCARDIOGRAM (TEE);  Surgeon: Thayer Headings, MD;  Location: [facility] ENDOSCOPY;  Service: Cardiovascular;  Laterality: N/A;  . TONSILLECTOMY  age 14    FAMILY HISTORY: Family History   Problem Relation Age of Onset  . Stroke Mother        Deceased @ 20  . Heart failure Mother        died from  . Liver cancer Father        Deceased @ 25  . Colon cancer Paternal Grandmother     SOCIAL HISTORY: Social History   Socioeconomic History  . Marital status: Married    Spouse name: [name]  . Number of children: 3  . Years of education: college  . Highest education level: Not on file  Occupational History  . Occupation: retired     Comment: Pharmacist, hospital , [place]  Social Needs  .  Financial resource strain: Not on file  . Food insecurity    Worry: Not on file    Inability: Not on file  . Transportation needs    Medical: Not on file    Non-medical: Not on file  Tobacco Use  . Smoking status: Never Smoker  . Smokeless tobacco: Never Used  Substance and Sexual Activity  . Alcohol use: Yes    Alcohol/week: 1.0 standard drinks    Types: 1 Glasses of wine per week    Comment: rare  . Drug use: No  . Sexual activity: Not Currently  Lifestyle  . Physical activity    Days per week: Not on file    Minutes per session: Not on file  . Stress: Not on file  Relationships  . Social Herbalist on phone: Not on file    Gets together: Not on file    Attends religious service: Not on file    Active member of club or organization: Not on file    Attends meetings of clubs or organizations: Not on file    Relationship status: Not on file  . Intimate partner violence    Fear of current or ex partner: Not on file    Emotionally abused: Not on file    Physically abused: Not on file    Forced sexual activity: Not on file  Other Topics Concern  . Not on file  Social History Narrative   Pt lives in [place] with spouse.  [name])  She was previously a Education officer, museum.    Caffeine- None   Right handed   Patient has three children.   Patient has a college education.      PHYSICAL EXAM  Vitals:   07/11/19 1310  BP: 135/65  Pulse: 85  Temp: (!) 97.3 F (36.3 C)   Weight: 123 lb 9.6 oz (56.1 kg)  Height: 5\' 2"  (1.575 m)   Body mass index is 22.61 kg/m.  Generalized: Well developed, in no acute distress  Chest: Lungs clear to auscultation bilaterally  Neurological examination  Mentation: Alert oriented to time, place, history taking. Follows all commands speech and language fluent Cranial nerve II-XII: Extraocular movements were full, visual field were full on confrontational test. . Head turning and shoulder shrug  were normal and symmetric. Motor: The motor testing reveals 5 over 5 strength of all 4 extremities. Good symmetric motor tone is noted throughout.  Sensory: Sensory testing is intact to soft touch on all 4 extremities. No evidence of extinction is noted.  Gait and station: Gait is normal. .   DIAGNOSTIC DATA (LABS, IMAGING, TESTING) - I reviewed patient records, labs, notes, testing and imaging myself where available.  Lab Results  Component Value Date   WBC 6.5 02/06/2017   HGB 12.6 02/06/2017   HCT 37.9 02/06/2017   MCV 92 02/06/2017   PLT 229 02/06/2017      Component Value Date/Time   NA 142 02/06/2017 1442   K 4.4 02/06/2017 1442   CL 100 02/06/2017 1442   CO2 30 (H) 02/06/2017 1442   GLUCOSE 88 02/06/2017 1442   GLUCOSE 104 (H) 06/23/2016 1145   BUN 16 02/06/2017 1442   CREATININE 0.77 02/06/2017 1442   CREATININE 1.15 (H) 01/12/2016 1247   CALCIUM 9.4 02/06/2017 1442   PROT 6.3 (L) 08/04/2015 1415   ALBUMIN 3.9 08/04/2015 1415   AST 34 08/04/2015 1415   ALT 23 08/04/2015 1415   ALKPHOS 72 08/04/2015 1415   BILITOT  0.9 08/04/2015 1415   GFRNONAA 75 02/06/2017 1442   GFRAA 86 02/06/2017 1442   Lab Results  Component Value Date   CHOL 146 08/21/2018   HDL 86 08/21/2018   LDLCALC 47 08/21/2018   TRIG 65 08/21/2018   CHOLHDL 1.7 08/21/2018   Lab Results  Component Value Date   HGBA1C 5.8 (H) 12/21/2011    Lab Results  Component Value Date   TSH 0.302 (L) 06/23/2016      ASSESSMENT AND PLAN 78  y.o. year old female  has a past medical history of Adrenal insufficiency ([place]), Cervical disc disease, CVA (cerebral infarction), Depression, Diverticulosis, GERD (gastroesophageal reflux disease), Glaucoma, Hyperlipidemia, Hypertension, Insomnia with sleep apnea, Internal hemorrhoid, Migraine headache, Osteoarthritis, Osteoporosis, Persistent atrial fibrillation, Polymyalgia rheumatica ([place]), and Tubular adenoma of colon (04/2007). here with:  1.  Obstructive sleep apnea on CPAP  Patient CPAP download shows excellent compliance and good treatment of her apnea.  She is encouraged to continue using CPAP nightly and greater than 4 hours each night.  She was encouraged to continue monitoring her headaches.  If the headache frequency increases she should let us know. .  She will follow-up in 1 year or sooner if needed   I spent 15 minutes with the patient. 50% of this time was spent reviewing CPAP download   Ward Givens, MSN, NP-C 07/11/2019, 11:42 AM [facility] [address] [place], [place] [address] [phone]

## 2019-07-13 DIAGNOSIS — Z23 Encounter for immunization: Secondary | ICD-10-CM | POA: Diagnosis not present

## 2019-07-24 ENCOUNTER — Other Ambulatory Visit (HOSPITAL_COMMUNITY): Payer: Self-pay | Admitting: Nurse Practitioner

## 2019-09-03 ENCOUNTER — Other Ambulatory Visit: Payer: Self-pay | Admitting: Internal Medicine

## 2019-09-10 ENCOUNTER — Other Ambulatory Visit: Payer: Self-pay

## 2019-09-10 ENCOUNTER — Emergency Department (HOSPITAL_BASED_OUTPATIENT_CLINIC_OR_DEPARTMENT_OTHER): Payer: PPO

## 2019-09-10 ENCOUNTER — Encounter (HOSPITAL_BASED_OUTPATIENT_CLINIC_OR_DEPARTMENT_OTHER): Payer: Self-pay

## 2019-09-10 ENCOUNTER — Emergency Department (HOSPITAL_BASED_OUTPATIENT_CLINIC_OR_DEPARTMENT_OTHER)
Admission: EM | Admit: 2019-09-10 | Discharge: 2019-09-10 | Disposition: A | Discharge status: Home / Self Care | Payer: PPO | Attending: Emergency Medicine | Admitting: Emergency Medicine

## 2019-09-10 DIAGNOSIS — Y92018 Other place in single-family (private) house as the place of occurrence of the external cause: Secondary | ICD-10-CM | POA: Diagnosis not present

## 2019-09-10 DIAGNOSIS — Y998 Other external cause status: Secondary | ICD-10-CM | POA: Diagnosis not present

## 2019-09-10 DIAGNOSIS — Z7901 Long term (current) use of anticoagulants: Secondary | ICD-10-CM | POA: Diagnosis not present

## 2019-09-10 DIAGNOSIS — W010XXA Fall on same level from slipping, tripping and stumbling without subsequent striking against object, initial encounter: Secondary | ICD-10-CM | POA: Insufficient documentation

## 2019-09-10 DIAGNOSIS — S0083XA Contusion of other part of head, initial encounter: Secondary | ICD-10-CM | POA: Diagnosis not present

## 2019-09-10 DIAGNOSIS — R0781 Pleurodynia: Secondary | ICD-10-CM | POA: Insufficient documentation

## 2019-09-10 DIAGNOSIS — I10 Essential (primary) hypertension: Secondary | ICD-10-CM | POA: Insufficient documentation

## 2019-09-10 DIAGNOSIS — W19XXXA Unspecified fall, initial encounter: Secondary | ICD-10-CM

## 2019-09-10 DIAGNOSIS — M25562 Pain in left knee: Secondary | ICD-10-CM | POA: Diagnosis not present

## 2019-09-10 DIAGNOSIS — Y9389 Activity, other specified: Secondary | ICD-10-CM | POA: Insufficient documentation

## 2019-09-10 DIAGNOSIS — S8992XA Unspecified injury of left lower leg, initial encounter: Secondary | ICD-10-CM | POA: Diagnosis not present

## 2019-09-10 DIAGNOSIS — Z8673 Personal history of transient ischemic attack (TIA), and cerebral infarction without residual deficits: Secondary | ICD-10-CM | POA: Diagnosis not present

## 2019-09-10 DIAGNOSIS — S299XXA Unspecified injury of thorax, initial encounter: Secondary | ICD-10-CM | POA: Diagnosis not present

## 2019-09-10 DIAGNOSIS — S0990XA Unspecified injury of head, initial encounter: Secondary | ICD-10-CM | POA: Diagnosis not present

## 2019-09-10 MED ORDER — LIDOCAINE 5 % EX PTCH: 1.0000 | MEDICATED_PATCH | CUTANEOUS | 0 refills | Status: DC

## 2019-09-10 MED ORDER — HYDROCODONE-ACETAMINOPHEN 5-325 MG PO TABS: 1.0000 | ORAL_TABLET | ORAL | 0 refills | Status: DC | PRN

## 2019-09-10 MED ORDER — OXYCODONE HCL 5 MG PO TABS
5.0000 mg | ORAL_TABLET | Freq: Once | ORAL | Status: AC | PRN
  Administered 2019-09-10: 5 mg via ORAL
  Filled 2019-09-10: qty 1

## 2019-09-10 NOTE — ED Notes (Signed)
Patient transported to CT 

## 2019-09-10 NOTE — ED Triage Notes (Signed)
Pt sates she fell on her deck ~230pm-pain to left wrist/hand, left knee, left side and left mid back-NAD-to triage in w/c

## 2019-09-10 NOTE — ED Notes (Signed)
ED Provider at bedside. 

## 2019-09-10 NOTE — Discharge Instructions (Addendum)
You were evaluated in the Emergency Department and after careful evaluation, we did not find any emergent condition requiring admission or further testing in the hospital.  Your exam/testing today is overall reassuring.  Your x-rays and CAT scans today were normal.  Please use the Lidoderm numbing patches on your ribs during the day to help with the pain.  If you are having trouble with pain at night you can use the Norco pain medication.  It is important to take deep breaths throughout the day to prevent pneumonia.  Please return to the Emergency Department if you experience any worsening of your condition.  We encourage you to follow up with a primary care provider.  Thank you for allowing [place] to be a part of your care.

## 2019-09-10 NOTE — ED Provider Notes (Signed)
[facility] Emergency Department Provider Note MRN:  [id number]  Arrival date & time: 09/10/19     Chief Complaint   Fall   History of Present Illness   Lorraine Little is a 78 y.o. year-old female with a history of CVA, A. fib, hypertension presenting to the ED with chief complaint of fall.  Patient was blowing the leaves on her deck when she fell.  States that she did not pass out, no dizziness or chest pain prior to the fall.  Thinks that she must have tripped or lost her balance.  Endorsing trauma to the left side of her face, left knee pain, left rib pain.  Pain is mild to moderate in severity, constant, worse with palpation.  Review of Systems  A complete 10 system review of systems was obtained and all systems are negative except as noted in the HPI and PMH.   Patient's Health History    Past Medical History:  Diagnosis Date  . Adrenal insufficiency (HCC)    takes prednisone 5mg  daily  . Cervical disc disease   . CVA (cerebral infarction)    a. 11/2011  . Depression   . Diverticulosis   . GERD (gastroesophageal reflux disease)    pt denies symptoms with this  . Glaucoma   . Hyperlipidemia    a. Diagnosed 12/2011  . Hypertension   . Insomnia with sleep apnea   . Internal hemorrhoid   . Migraine headache   . Osteoarthritis   . Osteoporosis   . Persistent atrial fibrillation ([place])    a.  Diagnosed 07/2011;  b. Normal nuclear myoview 07/2011 and normal EF at that time;  c. Most recent echo 2/20 /13 - EF 55-60% wit mild-mod MR.;  d. s/p DCCV 01/12/2012  . Polymyalgia rheumatica ([place])    a. On chronic steroids  . Tubular adenoma of colon 04/2007    Past Surgical History:  Procedure Laterality Date  . ATRIAL FIBRILLATION ABLATION N/A 02/16/2017   Procedure: Atrial Fibrillation Ablation;  Surgeon: Thompson Grayer, MD;  Location: [facility];  Service: Cardiovascular;  Laterality: N/A;  . BREAST LUMPECTOMY  1980's   right  x2 (benign)  .  CARDIOVERSION  01/12/2012   Procedure: CARDIOVERSION;  Surgeon: Lelon Perla, MD;  Location: [place];  Service: Cardiovascular;  Laterality: N/A;  . CARDIOVERSION  03/28/2012   Procedure: CARDIOVERSION;  Surgeon: Larey Dresser, MD;  Location: [place];  Service: Cardiovascular;  Laterality: N/A;  . CARDIOVERSION  06/29/2012   Procedure: CARDIOVERSION;  Surgeon: Darlin Coco, MD;  Location: [facility] ENDOSCOPY;  Service: Cardiovascular;  Laterality: N/A;  . CARDIOVERSION N/A 12/07/2015   Procedure: CARDIOVERSION;  Surgeon: Josue Hector, MD;  Location: [facility] ENDOSCOPY;  Service: Cardiovascular;  Laterality: N/A;  . ELECTROPHYSIOLOGIC STUDY N/A 01/19/2016   AFib ablation by Dr Rayann Heman  . GLAUCOMA SURGERY    . TEE WITHOUT CARDIOVERSION N/A 01/19/2016   Procedure: TRANSESOPHAGEAL ECHOCARDIOGRAM (TEE);  Surgeon: Thayer Headings, MD;  Location: [facility] ENDOSCOPY;  Service: Cardiovascular;  Laterality: N/A;  . TONSILLECTOMY  age 62    Family History  Problem Relation Age of Onset  . Stroke Mother        Deceased @ 87  . Heart failure Mother        died from  . Liver cancer Father        Deceased @ 35  . Colon cancer Paternal Grandmother     Social History   Socioeconomic History  .  Marital status: Married    Spouse name: [name]  . Number of children: 3  . Years of education: college  . Highest education level: Not on file  Occupational History  . Occupation: retired     Comment: Pharmacist, hospital , [place]  Social Needs  . Financial resource strain: Not on file  . Food insecurity    Worry: Not on file    Inability: Not on file  . Transportation needs    Medical: Not on file    Non-medical: Not on file  Tobacco Use  . Smoking status: Never Smoker  . Smokeless tobacco: Never Used  Substance and Sexual Activity  . Alcohol use: Yes    Comment: rare  . Drug use: No  . Sexual activity: Not on file  Lifestyle  . Physical activity    Days per week: Not on file    Minutes per session: Not on file  . Stress:  Not on file  Relationships  . Social Herbalist on phone: Not on file    Gets together: Not on file    Attends religious service: Not on file    Active member of club or organization: Not on file    Attends meetings of clubs or organizations: Not on file    Relationship status: Not on file  . Intimate partner violence    Fear of current or ex partner: Not on file    Emotionally abused: Not on file    Physically abused: Not on file    Forced sexual activity: Not on file  Other Topics Concern  . Not on file  Social History Narrative   Pt lives in [place] with spouse.  [name])  She was previously a Education officer, museum.    Caffeine- None   Right handed   Patient has three children.   Patient has a college education.     Physical Exam  Vital Signs and Nursing Notes reviewed Vitals:   09/10/19 2100 09/10/19 2201  BP: (!) 148/78 (!) 148/70  Pulse: 85 92  Resp: (!) 24 20  Temp:    SpO2: 100% 100%    CONSTITUTIONAL: Well-appearing, NAD NEURO:  Alert and oriented x 3, no focal deficits EYES:  eyes equal and reactive ENT/NECK:  no LAD, no JVD CARDIO: Regular rate, well-perfused, normal S1 and S2; tenderness palpation to the left lateral ribs PULM:  CTAB no wheezing or rhonchi GI/GU:  normal bowel sounds, non-distended, non-tender MSK/SPINE:  No gross deformities, no edema SKIN: Abrasion to the left cheek, abrasion to the left knee PSYCH:  Appropriate speech and behavior  Diagnostic and Interventional Summary    EKG Interpretation  Date/Time: September 10, 2019 at 20: 03: 07   Ventricular Rate:  87 PR Interval:  204 QRS Duration: 84 QT Interval:  362 QTC Calculation: 436 R Axis:     Text Interpretation: Sinus rhythm, first-degree AV block Confirmed by Dr. Gerlene Fee at 9:02 PM      Labs Reviewed - No data to display  XR Chest 2 View  Final Result    CT Head  Final Result    DG Knee Complete 4 Views Left    (Results Pending)    Medications  oxyCODONE  (Oxy IR/ROXICODONE) immediate release tablet 5 mg (5 mg Oral Given 09/10/19 1922)     Procedures  /  Critical Care Procedures  ED Course and Medical Decision Making  I have reviewed the triage vital signs and the nursing notes.  Pertinent  labs & imaging results that were available during my care of the patient were reviewed by me and considered in my medical decision making (see below for details).     Suspect mechanical fall, EKG is without acute concerns, imaging pending.  Patient is anticoagulated and with evidence of facial/head trauma, will obtain CT head.  Possibility of syncopal fall was considered, however patient has no abnormal vital signs, no history of heart failure, will place patient on cardiac monitoring.  Without arrhythmia, patient will be appropriate for discharge.  CT head is normal, x-rays are without fracture, patient is appropriate for discharge with pain management.  Barth Kirks. Sedonia Small, MD [place] [email or web address]  Final Clinical Impressions(s) / ED Diagnoses     ICD-10-CM   1. Fall, initial encounter  W19.XXXA   2. Rib pain on left side  R07.81 XR Chest 2 View    XR Chest 2 View  3. Contusion of face, initial encounter  S00.83XA   4. Anticoagulated  Z79.01     ED Discharge Orders         Ordered    HYDROcodone-acetaminophen (NORCO/VICODIN) 5-325 MG tablet  Every 4 hours PRN     09/10/19 2209    lidocaine (LIDODERM) 5 %  Every 24 hours     09/10/19 2209           Discharge Instructions Discussed with and Provided to Patient:     Discharge Instructions     You were evaluated in the Emergency Department and after careful evaluation, we did not find any emergent condition requiring admission or further testing in the hospital.  Your exam/testing today is overall reassuring.  Your x-rays and CAT scans today were normal.  Please use the Lidoderm numbing patches on your ribs during the day to help  with the pain.  If you are having trouble with pain at night you can use the Norco pain medication.  It is important to take deep breaths throughout the day to prevent pneumonia.  Please return to the Emergency Department if you experience any worsening of your condition.  We encourage you to follow up with a primary care provider.  Thank you for allowing [place] to be a part of your care.       Maudie Flakes, MD 09/10/19 2211

## 2019-09-28 ENCOUNTER — Other Ambulatory Visit: Payer: Self-pay | Admitting: Nurse Practitioner

## 2019-09-30 ENCOUNTER — Telehealth (HOSPITAL_COMMUNITY): Payer: Self-pay | Admitting: *Deleted

## 2019-09-30 MED ORDER — ATORVASTATIN CALCIUM 80 MG PO TABS: 80.0000 mg | ORAL_TABLET | Freq: Every day | ORAL | 0 refills | Status: DC

## 2019-09-30 NOTE — Telephone Encounter (Signed)
Pt needs refill on Lipitor sent to [facility] [address]. Appears to be followed by Dr. Rayann Heman for lipids.

## 2019-10-18 ENCOUNTER — Other Ambulatory Visit: Payer: Self-pay | Admitting: Internal Medicine

## 2019-10-18 ENCOUNTER — Other Ambulatory Visit: Payer: Self-pay | Admitting: Neurology

## 2019-10-18 ENCOUNTER — Other Ambulatory Visit (HOSPITAL_COMMUNITY): Payer: Self-pay | Admitting: Nurse Practitioner

## 2019-10-18 NOTE — Telephone Encounter (Signed)
Prescription refill request for Xarelto received.   Last office visit: 07/27/2018, [place] Weight: 53.5  kg, 09/10/2019 Age: 78 y.o. Scr:0.8, 05/30/2019, via [facility] CrCl: 47 ml/min  Pt is overdue for an office visit, pt has an appointment scheduled for 11/05/2019 with Roderic Palau. Pt qualifies for a dose change. Will message Dr. Rayann Heman to see if he would like to reduce pt's current dosage of xarelto.

## 2019-10-21 NOTE — Telephone Encounter (Signed)
Message Received: Yesterday Message Contents  Thompson Grayer, MD  Lutricia Feil, RN  Continue current dosing. We can reassess at follow-up in AF clinic.  No changes at this time.        Will refill prescription with current dose.

## 2019-10-22 ENCOUNTER — Other Ambulatory Visit (HOSPITAL_COMMUNITY): Payer: Self-pay | Admitting: Internal Medicine

## 2019-10-22 ENCOUNTER — Other Ambulatory Visit: Payer: Self-pay | Admitting: Nurse Practitioner

## 2019-11-05 ENCOUNTER — Ambulatory Visit (HOSPITAL_COMMUNITY)
Admission: RE | Admit: 2019-11-05 | Discharge: 2019-11-05 | Disposition: A | Discharge status: Home / Self Care | Payer: PPO | Source: Ambulatory Visit | Attending: Nurse Practitioner | Admitting: Nurse Practitioner

## 2019-11-05 ENCOUNTER — Encounter (HOSPITAL_COMMUNITY): Payer: Self-pay | Admitting: Nurse Practitioner

## 2019-11-05 ENCOUNTER — Other Ambulatory Visit: Payer: Self-pay

## 2019-11-05 VITALS — BP 140/72 | HR 88 | Ht 62.0 in | Wt 126.0 lb

## 2019-11-05 DIAGNOSIS — I480 Paroxysmal atrial fibrillation: Secondary | ICD-10-CM | POA: Diagnosis not present

## 2019-11-05 DIAGNOSIS — K5790 Diverticulosis of intestine, part unspecified, without perforation or abscess without bleeding: Secondary | ICD-10-CM | POA: Diagnosis not present

## 2019-11-05 DIAGNOSIS — Z7901 Long term (current) use of anticoagulants: Secondary | ICD-10-CM | POA: Diagnosis not present

## 2019-11-05 DIAGNOSIS — Z79899 Other long term (current) drug therapy: Secondary | ICD-10-CM | POA: Diagnosis not present

## 2019-11-05 DIAGNOSIS — M810 Age-related osteoporosis without current pathological fracture: Secondary | ICD-10-CM | POA: Insufficient documentation

## 2019-11-05 DIAGNOSIS — K219 Gastro-esophageal reflux disease without esophagitis: Secondary | ICD-10-CM | POA: Insufficient documentation

## 2019-11-05 DIAGNOSIS — D6869 Other thrombophilia: Secondary | ICD-10-CM | POA: Diagnosis not present

## 2019-11-05 DIAGNOSIS — I10 Essential (primary) hypertension: Secondary | ICD-10-CM | POA: Insufficient documentation

## 2019-11-05 DIAGNOSIS — E785 Hyperlipidemia, unspecified: Secondary | ICD-10-CM | POA: Diagnosis not present

## 2019-11-05 MED ORDER — RIVAROXABAN 20 MG PO TABS: ORAL_TABLET | ORAL | 3 refills | Status: DC

## 2019-11-05 MED ORDER — METOPROLOL SUCCINATE ER 25 MG PO TB24: ORAL_TABLET | ORAL | 3 refills | Status: DC

## 2019-11-05 NOTE — Progress Notes (Signed)
Primary Care Physician: Crist Infante, MD Referring Physician: Dr. Caryl Comes EP: Dr. Juanetta Snow Lorraine Little is a 79 y.o. female with a h/o aifb ablation 01/19/16 and  repeat ablation 02/16/17 that is in the afib clinic for f/u.She has done very well since the ablation and is pleased that she has not had any recurrent sustained arrhythmia.  Continues on xarelto 20 mg with a CHA2DS2VASc score of at least 5. No recent labs in Epic to calculate crcl to see  if xarelto dose is still correct. Will request labs.   Today, she denies symptoms of palpitations, chest pain, shortness of breath, orthopnea, PND, lower extremity edema, dizziness, presyncope, syncope, or neurologic sequela. The patient is tolerating medications without difficulties and is otherwise without complaint today.   Past Medical History:  Diagnosis Date  . Adrenal insufficiency (HCC)    takes prednisone 5mg  daily  . Cervical disc disease   . CVA (cerebral infarction)    a. 11/2011  . Depression   . Diverticulosis   . GERD (gastroesophageal reflux disease)    pt denies symptoms with this  . Glaucoma   . Hyperlipidemia    a. Diagnosed 12/2011  . Hypertension   . Insomnia with sleep apnea   . Internal hemorrhoid   . Migraine headache   . Osteoarthritis   . Osteoporosis   . Persistent atrial fibrillation ([place])    a.  Diagnosed 07/2011;  b. Normal nuclear myoview 07/2011 and normal EF at that time;  c. Most recent echo 2/20 /13 - EF 55-60% wit mild-mod MR.;  d. s/p DCCV 01/12/2012  . Polymyalgia rheumatica ([place])    a. On chronic steroids  . Tubular adenoma of colon 04/2007   Past Surgical History:  Procedure Laterality Date  . ATRIAL FIBRILLATION ABLATION N/A 02/16/2017   Procedure: Atrial Fibrillation Ablation;  Surgeon: Thompson Grayer, MD;  Location: [facility];  Service: Cardiovascular;  Laterality: N/A;  . BREAST LUMPECTOMY  1980's   right  x2 (benign)  . CARDIOVERSION  01/12/2012   Procedure: CARDIOVERSION;   Surgeon: Lelon Perla, MD;  Location: [facility];  Service: Cardiovascular;  Laterality: N/A;  . CARDIOVERSION  03/28/2012   Procedure: CARDIOVERSION;  Surgeon: Larey Dresser, MD;  Location: [facility];  Service: Cardiovascular;  Laterality: N/A;  . CARDIOVERSION  06/29/2012   Procedure: CARDIOVERSION;  Surgeon: Darlin Coco, MD;  Location: [facility];  Service: Cardiovascular;  Laterality: N/A;  . CARDIOVERSION N/A 12/07/2015   Procedure: CARDIOVERSION;  Surgeon: Josue Hector, MD;  Location: [facility];  Service: Cardiovascular;  Laterality: N/A;  . ELECTROPHYSIOLOGIC STUDY N/A 01/19/2016   AFib ablation by Dr Rayann Heman  . GLAUCOMA SURGERY    . TEE WITHOUT CARDIOVERSION N/A 01/19/2016   Procedure: TRANSESOPHAGEAL ECHOCARDIOGRAM (TEE);  Surgeon: Thayer Headings, MD;  Location: [facility];  Service: Cardiovascular;  Laterality: N/A;  . TONSILLECTOMY  age 53    Current Outpatient Medications  Medication Sig Dispense Refill  . Ascorbic Acid (VITAMIN C) 1000 MG tablet Take 1,000 mg by mouth daily.    Marland Kitchen atorvastatin (LIPITOR) 80 MG tablet TAKE 1 TAB DAILY. PLEASE CALL TO SCHEDULE AN APPOINTMENT FOR FUTURE REFILLS. THANK YOU [phone] 30 tablet 0  . Azelaic Acid (FINACEA) 15 % cream Apply 1 application topically at bedtime. After skin is thoroughly washed and patted dry, gently but thoroughly massage a thin film of azelaic acid cream into the affected area twice daily, in the morning and  evening.    . Cholecalciferol (VITAMIN D-3) 5000 UNITS TABS Take 1 tablet by mouth daily.     . Coenzyme Q10 (CO Q-10) 300 MG CAPS Take 300 mg by mouth at bedtime.    Marland Kitchen FERROUS SULFATE PO Take 165 mg by mouth at bedtime.     Marland Kitchen FIBER PO Take 2 tablets by mouth at bedtime. Chewable    . gabapentin (NEURONTIN) 100 MG capsule TAKE ONE CAPSULE BY MOUTH IN THE MORNING ,1 TABLET MID AFTERNOON & 2 TABLETS AT BEDTIME 360 capsule 1  . HYDROcodone-acetaminophen (NORCO/VICODIN) 5-325 MG tablet Take 1 tablet by mouth every 4  (four) hours as needed. 6 tablet 0  . lidocaine (LIDODERM) 5 % Place 1 patch onto the skin daily. Remove & Discard patch within 12 hours or as directed by MD 5 patch 0  . losartan (COZAAR) 100 MG tablet TAKE 1 TABLET (100 MG TOTAL) BY MOUTH DAILY. APPT REQUIRED FOR REFILLS [phone] 30 tablet 0  . metoprolol succinate (TOPROL-XL) 25 MG 24 hr tablet TAKE 0.5 TABS DAILY. TAKE 1/2 TAB FOR AFIB HR OVER 100 45 tablet 3  . omeprazole (PRILOSEC) 20 MG capsule Take 1 capsule (20 mg total) by mouth daily. 90 capsule 3  . OVER THE COUNTER MEDICATION 4 tablets daily. Tart Cherries OTC to help with arthritis    . Polyvinyl Alcohol-Povidone (REFRESH OP) Place 1 drop into both eyes daily as needed (dry eyes).    . predniSONE (DELTASONE) 1 MG tablet Take 3 mg by mouth daily with breakfast.     . Probiotic Product (PROBIOTIC DAILY PO) Take 1 capsule by mouth daily.    . rivaroxaban (XARELTO) 20 MG TABS tablet TAKE 1 TABLET BY MOUTH DAILY WITH SUPPER 90 tablet 3  . sodium chloride (OCEAN) 0.65 % SOLN nasal spray Place 1 spray into both nostrils as needed for congestion (As directed).     Marland Kitchen UNABLE TO FIND Take by mouth daily. Med Name: Citracell. Take one tablespoon     No current facility-administered medications for this encounter.    No Known Allergies  Social History   Socioeconomic History  . Marital status: Married    Spouse name: [name]  . Number of children: 3  . Years of education: college  . Highest education level: Not on file  Occupational History  . Occupation: retired     Comment: Pharmacist, hospital , [place]  Tobacco Use  . Smoking status: Never Smoker  . Smokeless tobacco: Never Used  Substance and Sexual Activity  . Alcohol use: Yes    Comment: rare  . Drug use: No  . Sexual activity: Not on file  Other Topics Concern  . Not on file  Social History Narrative   Pt lives in [place] with spouse.  [name])  She was previously a Education officer, museum.    Caffeine- None   Right handed   Patient  has three children.   Patient has a college education.   Social Determinants of Health   Financial Resource Strain:   . Difficulty of Paying Living Expenses: Not on file  Food Insecurity:   . Worried About Charity fundraiser in the Last Year: Not on file  . Ran Out of Food in the Last Year: Not on file  Transportation Needs:   . Lack of Transportation (Medical): Not on file  . Lack of Transportation (Non-Medical): Not on file  Physical Activity:   . Days of Exercise per Week: Not on file  . Minutes of  Exercise per Session: Not on file  Stress:   . Feeling of Stress : Not on file  Social Connections:   . Frequency of Communication with Friends and Family: Not on file  . Frequency of Social Gatherings with Friends and Family: Not on file  . Attends Religious Services: Not on file  . Active Member of Clubs or Organizations: Not on file  . Attends Archivist Meetings: Not on file  . Marital Status: Not on file  Intimate Partner Violence:   . Fear of Current or Ex-Partner: Not on file  . Emotionally Abused: Not on file  . Physically Abused: Not on file  . Sexually Abused: Not on file    Family History  Problem Relation Age of Onset  . Stroke Mother        Deceased @ 19  . Heart failure Mother        died from  . Liver cancer Father        Deceased @ 52  . Colon cancer Paternal Grandmother     ROS- All systems are reviewed and negative except as per the HPI above  Physical Exam: Vitals:   11/05/19 1105  BP: 140/72  Pulse: 88  Weight: 57.2 kg  Height: 5\' 2"  (1.575 m)    GEN- The patient is well appearing, alert and oriented x 3 today.   Head- normocephalic, atraumatic Eyes-  Sclera clear, conjunctiva pink Ears- hearing intact Oropharynx- clear Neck- supple, no JVP Lymph- no cervical lymphadenopathy Lungs- Clear to ausculation bilaterally, normal work of breathing Heart- Regular rate and rhythm, no murmurs, rubs or gallops, PMI not laterally  displaced GI- soft, NT, ND, + BS Extremities- no clubbing, cyanosis, or edema MS- no significant deformity or atrophy Skin- no rash or lesion Psych- euthymic mood, full affect Neuro- strength and sensation are intact  EKG- NSR at 84 bpm, pr int 204 ms, qrs int 70 ms, qtc 439 ms   Assessment and Plan: 1. Paroxysmal afib Quiet since last ablation Feels well Continue  metoprolol xl at current dose  Continue xarelto 20 mg  with chadsvasc score of at least 4  Recent  bmet from  PCP shows last bmet in July at 0.8mg /ml, crcl cal at 50.60 so will continue on current dose  2.  Hypertension  Stable   F/u afib clinic in one year   Geroge Baseman. Shayann Garbutt, [facility] [address] [place], [place] [address] [phone]

## 2019-11-15 ENCOUNTER — Other Ambulatory Visit (HOSPITAL_COMMUNITY): Payer: Self-pay | Admitting: Internal Medicine

## 2019-11-15 ENCOUNTER — Other Ambulatory Visit: Payer: Self-pay | Admitting: Nurse Practitioner

## 2019-11-25 ENCOUNTER — Encounter: Payer: Self-pay | Admitting: Neurology

## 2019-11-25 ENCOUNTER — Ambulatory Visit: Payer: PPO | Admitting: Neurology

## 2019-11-25 ENCOUNTER — Other Ambulatory Visit: Payer: Self-pay

## 2019-11-25 VITALS — BP 128/70 | HR 95 | Temp 97.0°F | Ht 61.5 in | Wt 125.0 lb

## 2019-11-25 DIAGNOSIS — Z9989 Dependence on other enabling machines and devices: Secondary | ICD-10-CM | POA: Diagnosis not present

## 2019-11-25 DIAGNOSIS — R001 Bradycardia, unspecified: Secondary | ICD-10-CM | POA: Diagnosis not present

## 2019-11-25 DIAGNOSIS — W19XXXS Unspecified fall, sequela: Secondary | ICD-10-CM | POA: Diagnosis not present

## 2019-11-25 DIAGNOSIS — S069X9A Unspecified intracranial injury with loss of consciousness of unspecified duration, initial encounter: Secondary | ICD-10-CM | POA: Diagnosis not present

## 2019-11-25 DIAGNOSIS — I480 Paroxysmal atrial fibrillation: Secondary | ICD-10-CM | POA: Diagnosis not present

## 2019-11-25 DIAGNOSIS — M353 Polymyalgia rheumatica: Secondary | ICD-10-CM | POA: Diagnosis not present

## 2019-11-25 DIAGNOSIS — G4733 Obstructive sleep apnea (adult) (pediatric): Secondary | ICD-10-CM | POA: Diagnosis not present

## 2019-11-25 DIAGNOSIS — K219 Gastro-esophageal reflux disease without esophagitis: Secondary | ICD-10-CM | POA: Diagnosis not present

## 2019-11-25 MED ORDER — CYPROHEPTADINE HCL 4 MG PO TABS: 4.0000 mg | ORAL_TABLET | Freq: Every day | ORAL | 0 refills | Status: DC

## 2019-11-25 MED ORDER — GABAPENTIN 100 MG PO CAPS: ORAL_CAPSULE | ORAL | 1 refills | Status: DC

## 2019-11-25 NOTE — Patient Instructions (Addendum)
Post traumatic headaches, shoulder pain, face injury.  Visual hallucination , possibly (?) not sure  Fall may be relate to atrial fib ? Cardiac monitor.   Rv with NP in 3 month .   Cyproheptadine tablets What is this medicine? CYPROHEPTADINE (si proe HEP ta deen) is a antihistamine. This medicine is used to treat allergy symptoms and some headches. It is can help stop runny nose. This medicine may be used for other purposes; ask your health care provider or pharmacist if you have questions. COMMON BRAND NAME(S): Periactin What should I tell my health care provider before I take this medicine? They need to know if you have any of these conditions:    Glaucoma-   prostate disease  ulcers or other stomach problems  an unusual or allergic reaction to cyproheptadine, other medicines foods, dyes, or preservatives  pregnant or trying to get pregnant  breast-feeding How should I use this medicine? Take this medicine by mouth with a glass of water. Follow the directions on the prescription label. Take your doses at regular intervals. Do not take your medicine more often than directed. Talk to your pediatrician regarding the use of this medicine in children. While this drug may be prescribed for children as young as 4 years of age for selected conditions, precautions do apply. Overdosage: If you think you have taken too much of this medicine contact a poison control center or emergency room at once. NOTE: This medicine is only for you. Do not share this medicine with others. What if I miss a dose? If you miss a dose, take it as soon as you can. If it is almost time for your next dose, take only that dose. Do not take double or extra doses. What may interact with this medicine? Do not take this medicine with any of the following medications:  MAOIs like Carbex, Eldepryl, Marplan, Nardil, and Parnate This medicine may also interact with the following medications:  alcohol  barbiturate  medicines for inducing sleep or treating seizures  medicines for depression, anxiety or psychotic disturbances  medicines for movement abnormalities  medicines for sleep  medicines for stomach problems  some medicines for cold or allergies This list may not describe all possible interactions. Give your health care provider a list of all the medicines, herbs, non-prescription drugs, or dietary supplements you use. Also tell them if you smoke, drink alcohol, or use illegal drugs. Some items may interact with your medicine. What should I watch for while using this medicine? Visit your doctor or health care professional for regular check ups. Tell your doctor if your symptoms do not improve or if they get worse. You may get drowsy or dizzy. Do not drive, use machinery, or do anything that needs mental alertness until you know how this medicine affects you. Do not stand or sit up quickly, especially if you are an older patient. This reduces the risk of dizzy or fainting spells. Alcohol may interfere with the effect of this medicine. Avoid alcoholic drinks. Your mouth may get dry. Chewing sugarless gum or sucking hard candy, and drinking plenty of water may help. Contact your doctor if the problem does not go away or is severe. This medicine may cause dry eyes and blurred vision. If you wear contact lenses you may feel some discomfort. Lubricating drops may help. See your eye doctor if the problem does not go away or is severe. This medicine can make you more sensitive to the sun. Keep out of the sun.  If you cannot avoid being in the sun, wear protective clothing and use sunscreen. Do not use sun lamps or tanning beds/booths. What side effects may I notice from receiving this medicine? Side effects that you should report to your doctor or health care professional as soon as possible:  allergic reactions like skin rash, itching or hives, swelling of the face, lips, or tongue  agitation, nervousness,  excitability, not able to sleep  chest pain  irregular, fast heartbeat  pain or difficulty passing urine  seizures  unusual bleeding or bruising  unusually weak or tired  yellowing of the eyes or skin Side effects that usually do not require medical attention (report to your doctor or health care professional if they continue or are bothersome):  constipation or diarrhea  headache  loss of appetite  nausea, vomiting  stomach upset  weight gain This list may not describe all possible side effects. Call your doctor for medical advice about side effects. You may report side effects to FDA at 1-800-FDA-1088. Where should I keep my medicine? Keep out of the reach of children. Store at room temperature between 15 and 30 degrees C (59 and 86 degrees F). Keep container tightly closed. Throw away any unused medicine after the expiration date. NOTE: This sheet is a summary. It may not cover all possible information. If you have questions about this medicine, talk to your doctor, pharmacist, or health care provider.  2020 Elsevier/Gold Standard (2008-01-21 16:29:53)

## 2019-11-25 NOTE — Progress Notes (Signed)
[facility]  Provider:  Larey Little, M D  Referring Provider: Crist Infante, MD Primary Care Physician:  Lorraine Infante, MD  Chief Complaint  Patient presents with  . Follow-up    new problem, pt alone, rm 10. pt had a fall nov 10. hit head on the left side and + LOC. in the last week she developed a hallucination, she heard someone knocking on the door. it was 4:15 in the morning and the knocking woke her up. she swears she saw a man standing. that is only time that has occured.     HPI:  11-25-2019: Patient was last seen 07-2019 by Lorraine Givens, NP. Here today after 2 falls, the first leading to a head injury. Lorraine Little is a 79 y.o., caucasian, right handed female patient with insomnia and OSA, now both conditions are well treated. She has had atrial fibrillation and had a severe fall -the patient has a history also of CVA, hypertension that presented to the emergency room on the 11th of note and September 10, 2019.  Apparently she had used a leaf blower to clear her patio and all she remembers is that she must have hit the deck with her face because she came back to on the floor and had noticed how her left face felt stinging.  There is no memory of the fall itself.  Not sure how long she was out.  The leaf blower also hit the patio - her hsuabnd called her by phone and that was woke her.  Past medical history is positive for cervical disc disease CVA in January 2013 adrenal insufficiency, depression, hyperlipidemia, hypertension, insomnia with sleep apnea which is well treated.  Persistent atrial fibrillation diagnosed 2012 polymyalgia rheumatica on steroids migraine headaches.  The patient reports that now following the fall and after undergoing a head CT which was negative for bleed she developed persistent headaches but also visual hallucinations.  I was able to review Lorraine Little's labs which are already from May 30, 2019, the patient has not seen her  primary care physician since the fall, she has  been seen in the atrial fibrillation clinic.  She reports having seen a female person on the outside of the glass doors, after she was woken by a knocking sound. She is convinced she saw a man- can describe his clothing. She did not leave the bed, not wanting to expose herself. When she looked out again , the person was still standing there - 30 minutes later? She is unsure if that person couldn't be real.  She did not hear anything else.  She is not taking PD or RLS medications.   CPAP Lorraine Little CPAP compliance has been 100% again, she has always been an excellent compliant patient she loves using her CPAP.  Average user time is 7 hours 32 minutes, this machine is an Manufacturing systems engineer with a minimum pressure of 5 and a maximum pressure of 10 cmH2O 1 cm EPR.  Her residual AHI also known as apnea hypopnea index, is 0.7/h which speaks for an excellent resolution.  95th percentile pressure is 9.9 cmH2O air leaks are moderate.   PS :  She had mentioned lin 2019  that she had a fall on 19th of December 2018. She felt her left leg wouldn't move after she rose for her chair. She fell onto the carpet. Fractured her ankle.    08-14-14 ; Lorraine Little underwent a sleep study on 11-27-13. AHI was 33.1/h and  her RDI was 33.1/hr. as well. Not a mild case of apnea.The lowest oxygen level was at 78% , 02 saturation was 12.6 minutes total time below 89% saturation. She had irregular heart rate and normal sinus rhythm throughout the diagnostic study. There were no periodic limb movements noted. Based on the high AHI CPAP was initiated and the patient was only titrated to 5 cm with. She still had a very poor sleep efficiency but AHI was reduced to 0. She used a nasal pillow and an ESON  nasal mask.  Download was reviewed today. She brought me today at a download from her machine and it shows that she has used the machine 5 hours and 5 minutes nightly , a 97% compliance over 30  days.  Again,  her main problem now is not the residual apnea but her insomnia. She had trouble sleeping for a long time before OSA was diagnosed and CPAP initiated. The CPAP has helped her headaches! Her problem is sleep initiation, not staying asleep. She has less palpiations. Continues on amiodarone , which can induce insomnia . She is tolerating it well, but had some neuropathic changes.  Belsomra at 10 mg was felt too strong, to groggy making in AM.  I offered her 5 mg today. I would like for her to consider the ablation therapy form atrial fibrillation, and to be able to d/c the amiodarone treatment. She will discuss this step with Lorraine Little , her cardiologist and electrophysiologist this afternoon.   Revisit from 08-20-15, Lorraine Little is here today and reports that she has not used her CPAP for over 5 months. Her headaches however have not returned. She does not feel it had any impact on her sleep or lack of sleep. She has been treated with gabapentin and she thinks this has successfully controlled her headaches. Her fatigue severity score today is 31 points and her Epworth sleepiness score is 4 points the geriatric depression score is endorsed at one point. Overall she feels well. She never found an comfortable interface without any breakout or rash, and the nasal pillow dislodged frequently. This actually interrupted her sleep and she felt that this she got less sleep on CPAP than without. She effectively discontinued CPAP use. Her cardiologist suggested to use CPAP for better HTN control. She will have to make a decision.   Rv 01-14-16  I agree with Dr Lorraine Little that atrial fibrillations are precipitated by risk factors such as caffeine or stimulant use,  OSA. Her apnea is moderate -severe. She struggles with Insomnia and felt CPAP was contributing or at least not helping. She reported air leaks form nasal interface , irritating her eyes. I will reinitiate CPAP at current pressure.  She  endorsed the Epworth sleepiness score at 3 points fatigue severity at 32 points, geriatric depression at one point. Today's download shows an 83% compliance was 24 out of 30 days of over 4 hours of daily use. The patient used to machine 5 hours and 31 minutes a day , on average this is an Auto machine between 5 and 10 cm water pressure was 1 cm EPR, residual AHI is 0.6 and excellent result the 95th percentile pressure is 10 cm water. I will ask her to have an appointment for a daytime PAP Nap. This should reveal the best fitting interface - perhaps a wisp?   She  is seen here as a revisit  from Dr. Joylene Draft for evaluation of sleep apnea with insomnia on CPAP . Dr. Leonie Man  has seen this patient on  [facility] Stroke service.     07-19-2016 Mrs. Holle an established patient in the [place] and is presenting here today with a complaint of intermittent insomnia. She can not even sleep on vacation while [place]. This has been a problem for several years. Her primary care physician Dr. Crist Little has at times prescribed sleep aids,  but felt that it was reasonable to evaluate her sleep further before continuing to prescribe sedatives. The sleep test was positive and the patient was started on CPAP- after initially struggling, she is now compliant and used CPAP with great regularity. The mask took some getting used to. She remains hoarse - see Assessment and Plan. She has seen a specialist at [facility], has a problem at the voice box, a tremor.  Just this Sunday night she reports she had severe palpitations and almost fell that her whole body was moving she couldn't keep still. This was an exception because most nights she is doing very well with the use of her CPAP and has actually some restful sleep. I was able to see today's compliance report dated 18th of September 2017 and she has 100% compliance for days 93 complaint for hours of use for 7 hours and 30 minutes average she is using an AutoSet  between 5 and 10 with a residual AHI of 0.5 a pressure requirement at the 91st percentile is 10 cm water. No changes have to be made. She is not using any Ambien or Xanax or any sleep aid ! Good result.  She will see Dr Lorraine Little , follow up from ablation !  I had the pleasure of seeing Mrs. Azurdia today on 07/20/2017, this was meant to be a routine follow-up visit for CPAP compliance but the patient also remarked that she has been back in atrial fibrillation and will see her cardiologist, Dr. Rayann Little, to plan a new ablation procedure. Mrs. Richerson has been compliant with her CPAP she has used at 30 out of 30 days was 97 complaint percent compliance by hours. On average use is 7 hours and 8 minutes at night this is an AutoSet between 5 and 10 cm water was 1 cm EPR, the patient's AHI is 1.3. This is an excellent result The patient noticed palpitations over the last month and more frequently at a time when she was compliant with CPAP use. She does need better follow-up from her durable medical equipment company. 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 He is now with [facility] and, if she likes, I will refer her there. She would like to try a wisp.   Interval history from 24 July 2018, I have the pleasure of seeing Mrs. Sciandra today for her yearly revisit, she still suffers from atrial fibrillation, palpitation and is status post 2 ablations by now.  Her CPAP experience has become better and she is now 100% compliant over the last 90 days, with an average use at time of 7 hours 33 minutes on an AutoSet CPAP which allows a pressure window between 5 and 10 cmH2O was 1 cm EPR, residual AHI is 0.4 which is an excellent resolution, she does not have Cheyne-Stokes respirations, the 95th percentile pressure is at 9.9 cmH2O and she does have some moderate air leakage which she attributes to oral air leaking.  She may try to use a chinstrap again if she wants to, but I think that  given  the low residual apnea count she is actually well treated without.       Review of Systems: Out of a complete 14 system review, the patient complains of only the following symptoms, and all other reviewed systems are negative. Snoring, dry mouth, sore nostrils on pillows,  Bridge of nose pressure with nasal mask and FFM - intolerant of a chin strap. Still has palpitations, Insomnia better controlled on melatonin.   How likely are you to doze in the following situations: 0 = not likely, 1 = slight chance, 2 = moderate chance, 3 = high chance  Sitting and Reading? 1 Watching Television? 1-2 Sitting inactive in a public place (theater or meeting)? Lying down in the afternoon when circumstances permit?2 Sitting and talking to someone? Sitting quietly after lunch without alcohol?1 In a car, while stopped for a few minutes in traffic? As a passenger in a car for an hour without a break?  Total = 6   Epworth score at 6 points, FSS at 28/ 63 points. GDS 4/ 15 points    Social History   Socioeconomic History  . Marital status: Married    Spouse name: [name]  . Number of children: 3  . Years of education: college  . Highest education level: Not on file  Occupational History  . Occupation: retired     Comment: Pharmacist, hospital , [place]  Tobacco Use  . Smoking status: Never Smoker  . Smokeless tobacco: Never Used  Substance and Sexual Activity  . Alcohol use: Yes    Comment: rare  . Drug use: No  . Sexual activity: Not on file  Other Topics Concern  . Not on file  Social History Narrative   Pt lives in [place] with spouse.  [name])  She was previously a Education officer, museum.    Caffeine- None   Right handed   Patient has three children.   Patient has a college education.   Social Determinants of Health   Financial Resource Strain:   . Difficulty of Paying Living Expenses: Not on file  Food Insecurity:   . Worried About Charity fundraiser in the Last Year: Not on file  . Ran Out of  Food in the Last Year: Not on file  Transportation Needs:   . Lack of Transportation (Medical): Not on file  . Lack of Transportation (Non-Medical): Not on file  Physical Activity:   . Days of Exercise per Week: Not on file  . Minutes of Exercise per Session: Not on file  Stress:   . Feeling of Stress : Not on file  Social Connections:   . Frequency of Communication with Friends and Family: Not on file  . Frequency of Social Gatherings with Friends and Family: Not on file  . Attends Religious Services: Not on file  . Active Member of Clubs or Organizations: Not on file  . Attends Archivist Meetings: Not on file  . Marital Status: Not on file  Intimate Partner Violence:   . Fear of Current or Ex-Partner: Not on file  . Emotionally Abused: Not on file  . Physically Abused: Not on file  . Sexually Abused: Not on file    Family History  Problem Relation Age of Onset  . Stroke Mother        Deceased @ 62  . Heart failure Mother        died from  . Liver cancer Father        Deceased @ 25  .  Colon cancer Paternal Grandmother      Allergies as of 11/25/2019  . (No Known Allergies)    Vitals: BP 128/70   Pulse 95   Temp (!) 97 F (36.1 C)   Ht 5' 1.5" (1.562 m)   Wt 125 lb (56.7 kg)   BMI 23.24 kg/m  Last Weight:  Wt Readings from Last 1 Encounters:  11/25/19 125 lb (56.7 kg)   Last Height:   Ht Readings from Last 1 Encounters:  11/25/19 5' 1.5" (1.562 m)    Physical exam: hoarse voice.  Loss of sense of smell - years   General: The patient is awake, alert and appears not in acute distress. The patient is very well groomed. Head: Normocephalic, but visible bruising on the left cheekbone  . Neck is supple. Mallampati 2, neck circumference: 14 inches . No retrognathia.  Cardiovascular:  Regular rate and rhythm , without murmurs or carotid bruit, and without distended neck veins. Respiratory: Lungs are clear to auscultation. Skin:  Without evidence of  edema, Trunk: no scoliosis. Neurologic exam : The patient is awake and alert, oriented to place and time.   Memory subjective described as intact.  There is a normal attention span & concentration ability. The patient is articulate and cooperative.  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  Mood and affect are anxious, concerned, worried. Cranial nerves: loss of smell for at least 2 years ( can't smell perfumes") Pupils are equal and briskly reactive to light. Facial motor strength is symmetric,  tongue and uvula move in midline.  Motor exam:   Left biceps with cog-wheeling- but patient is in pain- the left shoulder was injured in the fall- Coordination: Rapid alternating movements in the fingers/hands is normal, no resting tremor. Gait and station: Patient walks without assistive device. She walked fast, did not lean or drift. Has a slightly stooped posture. Normal arm swing. Turned with 3 steps.  Deep tendon reflexes: in the upper and lower extremities are symmetric and intact.    Assessment:  After physical and neurologic examination, review of laboratory studies, imaging, neurophysiology testing and pre-existing records,  45 minute total assessment with more than 50% of the face to face time dedicated to discussion of atrial fib overlay with OSA, insomnia now not longer present.   1) fall , unexplained with post traumatic frontal headaches - and report of a possible visual hallucination. Also blurred vision, dry eyes, loss of smell and left over right cog wheeling. Could this be Lewy body disaese?  2) small vessel disease, brain atrophy. 3) chronic atrial fib= 4)  OSA on CPAP treatment with very good compliance, finally happy with her mask fit - Thank you, Jonni Sanger !!!.  PS:   She has had previously ( 2019) reported more headaches after another fall and slight dizziness with rising form a chair - related to a fib?.  I like for  her to have a repeat cardiac monitor and try periactin for headache-  And keep on gabapentin.      RV in 6 month with NP.    Lorraine Seat, MD

## 2019-11-26 ENCOUNTER — Telehealth: Payer: Self-pay | Admitting: Neurology

## 2019-11-26 NOTE — Telephone Encounter (Signed)
Pt called and LVM stating that she was put on a new medication but she does not know if she was to continue the Gabapentin and take them together or to stop the Gabapentin and just take the new medication. Please advise.

## 2019-11-26 NOTE — Telephone Encounter (Signed)
Called the patient to review. There was no answer. Unable to LVM for the patient due to mailbox full.   If patient calls back both medications are safe and she would like patient to continue to take gabapentin with the new prescribed medication.

## 2019-12-01 ENCOUNTER — Encounter: Payer: Self-pay | Admitting: Neurology

## 2019-12-09 ENCOUNTER — Other Ambulatory Visit: Payer: Self-pay

## 2019-12-09 ENCOUNTER — Ambulatory Visit: Payer: PPO | Admitting: Podiatry

## 2019-12-09 VITALS — Temp 96.3°F

## 2019-12-09 DIAGNOSIS — M79676 Pain in unspecified toe(s): Secondary | ICD-10-CM | POA: Diagnosis not present

## 2019-12-09 DIAGNOSIS — B351 Tinea unguium: Secondary | ICD-10-CM | POA: Diagnosis not present

## 2019-12-11 NOTE — Progress Notes (Signed)
   SUBJECTIVE Patient presents to office today complaining of elongated, thickened nails that cause pain while ambulating in shoes. She is unable to trim her own nails. Patient is here for further evaluation and treatment.  Past Medical History:  Diagnosis Date  . Adrenal insufficiency (HCC)    takes prednisone 5mg  daily  . Cervical disc disease   . CVA (cerebral infarction)    a. 11/2011  . Depression   . Diverticulosis   . GERD (gastroesophageal reflux disease)    pt denies symptoms with this  . Glaucoma   . Hyperlipidemia    a. Diagnosed 12/2011  . Hypertension   . Insomnia with sleep apnea   . Internal hemorrhoid   . Migraine headache   . Osteoarthritis   . Osteoporosis   . Persistent atrial fibrillation ([place])    a.  Diagnosed 07/2011;  b. Normal nuclear myoview 07/2011 and normal EF at that time;  c. Most recent echo 2/20 /13 - EF 55-60% wit mild-mod MR.;  d. s/p DCCV 01/12/2012  . Polymyalgia rheumatica ([place])    a. On chronic steroids  . Tubular adenoma of colon 04/2007    OBJECTIVE General Patient is awake, alert, and oriented x 3 and in no acute distress. Derm Skin is dry and supple bilateral. Negative open lesions or macerations. Remaining integument unremarkable. Nails are tender, long, thickened and dystrophic with subungual debris, consistent with onychomycosis, 1-5 bilateral. No signs of infection noted. Vasc  DP and PT pedal pulses palpable bilaterally. Temperature gradient within normal limits.  Neuro Epicritic and protective threshold sensation grossly intact bilaterally.  Musculoskeletal Exam No symptomatic pedal deformities noted bilateral. Muscular strength within normal limits.  ASSESSMENT 1. Onychodystrophic nails 1-5 bilateral with hyperkeratosis of nails.  2. Onychomycosis of nail due to dermatophyte bilateral 3. Pain in foot bilateral  PLAN OF CARE 1. Patient evaluated today.  2. Instructed to maintain good pedal hygiene and foot care.  3. Mechanical  debridement of nails 1-5 bilaterally performed using a nail nipper. Filed with dremel without incident.  4. Return to clinic in 3 mos.    Edrick Kins, DPM [facility]  Dr. Edrick Kins, [address]                                        [place], [place] [address]                Office [phone]  Fax [phone]

## 2019-12-18 ENCOUNTER — Other Ambulatory Visit: Payer: Self-pay | Admitting: Neurology

## 2019-12-25 DIAGNOSIS — H40033 Anatomical narrow angle, bilateral: Secondary | ICD-10-CM | POA: Diagnosis not present

## 2019-12-25 DIAGNOSIS — H2511 Age-related nuclear cataract, right eye: Secondary | ICD-10-CM | POA: Diagnosis not present

## 2019-12-25 DIAGNOSIS — N390 Urinary tract infection, site not specified: Secondary | ICD-10-CM | POA: Diagnosis not present

## 2019-12-25 DIAGNOSIS — Z961 Presence of intraocular lens: Secondary | ICD-10-CM | POA: Diagnosis not present

## 2020-02-07 ENCOUNTER — Other Ambulatory Visit (HOSPITAL_COMMUNITY): Payer: Self-pay | Admitting: *Deleted

## 2020-02-07 MED ORDER — METOPROLOL SUCCINATE ER 25 MG PO TB24: ORAL_TABLET | ORAL | 6 refills | Status: DC

## 2020-02-10 ENCOUNTER — Other Ambulatory Visit (HOSPITAL_COMMUNITY): Payer: Self-pay | Admitting: Internal Medicine

## 2020-02-11 NOTE — Telephone Encounter (Signed)
This is a A-Fib pt. Dr. Rayann Heman referred the pt to the A-Fib clinic.

## 2020-02-24 ENCOUNTER — Other Ambulatory Visit: Payer: Self-pay

## 2020-02-24 ENCOUNTER — Ambulatory Visit: Payer: PPO | Admitting: Adult Health

## 2020-02-24 ENCOUNTER — Encounter: Payer: Self-pay | Admitting: Adult Health

## 2020-02-24 VITALS — BP 134/82 | HR 78 | Temp 97.4°F | Ht 61.5 in | Wt 123.0 lb

## 2020-02-24 DIAGNOSIS — Z9989 Dependence on other enabling machines and devices: Secondary | ICD-10-CM

## 2020-02-24 DIAGNOSIS — G4733 Obstructive sleep apnea (adult) (pediatric): Secondary | ICD-10-CM

## 2020-02-24 DIAGNOSIS — G4489 Other headache syndrome: Secondary | ICD-10-CM

## 2020-02-24 NOTE — Progress Notes (Signed)
PATIENT: Lorraine Little DOB: 12/15/1940  REASON FOR VISIT: follow up HISTORY FROM: patient  HISTORY OF PRESENT ILLNESS: Today 02/24/20:  Lorraine Little is an 79 year old female with a history of obstructive sleep apnea on CPAP.  Her download indicates that she use her machine nightly for compliance of 100%.  She used her machine greater than 4 hours 29 out of 30 days for compliance of 97%.  She used her machine on average 7 hours and 50 minutes.  Her residual AHI is 0.5 on 5 to 10 cm of water with EPR of 1.  Leak in the 95th percentile is 34.8 L/min.  She continues to take gabapentin for headaches.  She typically takes 300 mg daily on occasion she may take 400 if she has a headache.  She reports that she has been having sharp shooting pains behind the left ear.  These only last for seconds.  Reports that they do not occur consistently.  HISTORY 11-25-2019: Patient was last seen 07-2019 by Ward Givens, NP. Here today after 2 falls, the first leading to a head injury. Lorraine Little is a 79 y.o., caucasian, right handed female patient with insomnia and OSA, now both conditions are well treated. She has had atrial fibrillation and had a severe fall -the patient has a history also of CVA, hypertension that presented to the emergency room on the 11th of note and September 10, 2019.  Apparently she had used a leaf blower to clear her patio and all she remembers is that she must have hit the deck with her face because she came back to on the floor and had noticed how her left face felt stinging.  There is no memory of the fall itself.  Not sure how long she was out.  The leaf blower also hit the patio - her hsuabnd called her by phone and that was woke her.  Past medical history is positive for cervical disc disease CVA in January 2013 adrenal insufficiency, depression, hyperlipidemia, hypertension, insomnia with sleep apnea which is well treated.  Persistent atrial fibrillation diagnosed 2012  polymyalgia rheumatica on steroids migraine headaches.  The patient reports that now following the fall and after undergoing a head CT which was negative for bleed she developed persistent headaches but also visual hallucinations.  I was able to review [name] labs which are already from May 30, 2019, the patient has not seen her primary care physician since the fall, she has  been seen in the atrial fibrillation clinic.  She reports having seen a female person on the outside of the glass doors, after she was woken by a knocking sound. She is convinced she saw a man- can describe his clothing. She did not leave the bed, not wanting to expose herself. When she looked out again , the person was still standing there - 30 minutes later? She is unsure if that person couldn't be real.  She did not hear anything else.  She is not taking PD or RLS medications.   CPAP Lorraine Little CPAP compliance has been 100% again, she has always been an excellent compliant patient she loves using her CPAP.  Average user time is 7 hours 32 minutes, this machine is an Manufacturing systems engineer with a minimum pressure of 5 and a maximum pressure of 10 cmH2O 1 cm EPR.  Her residual AHI also known as apnea hypopnea index, is 0.7/h which speaks for an excellent resolution.  95th percentile pressure is 9.9 cmH2O air  leaks are moderate.    REVIEW OF SYSTEMS: Out of a complete 14 system review of symptoms, the patient complains only of the following symptoms, and all other reviewed systems are negative.  FSS 17  ALLERGIES: No Known Allergies  HOME MEDICATIONS: Outpatient Medications Prior to Visit  Medication Sig Dispense Refill  . Ascorbic Acid (VITAMIN C) 1000 MG tablet Take 1,000 mg by mouth daily.    Marland Kitchen atorvastatin (LIPITOR) 80 MG tablet Take 1 tablet (80 mg total) by mouth daily at 6 PM. 90 tablet 0  . Azelaic Acid (FINACEA) 15 % cream Apply 1 application topically at bedtime. After skin is thoroughly washed and patted  dry, gently but thoroughly massage a thin film of azelaic acid cream into the affected area twice daily, in the morning and evening.    . Calcium Carbonate-Vitamin D (CALCIUM 600+D HIGH POTENCY PO) Take 1 tablet by mouth daily.    . Cholecalciferol (VITAMIN D-3) 5000 UNITS TABS Take 1 tablet by mouth daily.     . Coenzyme Q10 (CO Q-10) 300 MG CAPS Take 300 mg by mouth at bedtime.    Marland Kitchen FERROUS SULFATE PO Take 165 mg by mouth at bedtime.     Marland Kitchen FIBER PO Take 2 tablets by mouth at bedtime. Chewable    . gabapentin (NEURONTIN) 100 MG capsule 1 in PM and 2 at night 360 capsule 1  . losartan (COZAAR) 100 MG tablet Take 1 tablet (100 mg total) by mouth daily. 30 tablet 11  . metoprolol succinate (TOPROL-XL) 25 MG 24 hr tablet TAKE 0.5 TABS DAILY. TAKE 1/2 TAB FOR AFIB HR OVER 100 45 tablet 6  . omeprazole (PRILOSEC) 20 MG capsule Take 1 capsule (20 mg total) by mouth daily. 90 capsule 3  . OVER THE COUNTER MEDICATION 4 tablets daily. Tart Cherries OTC to help with arthritis    . polyethylene glycol powder (GLYCOLAX/MIRALAX) 17 GM/SCOOP powder Take by mouth.    . Polyvinyl Alcohol-Povidone (REFRESH OP) Place 1 drop into both eyes daily as needed (dry eyes).    Marland Kitchen POTASSIUM CHLORIDE PO Take by mouth 3 (three) times daily.    . predniSONE (DELTASONE) 1 MG tablet Take 4 mg by mouth daily with breakfast.     . Probiotic Product (PROBIOTIC DAILY PO) Take 1 capsule by mouth daily.    . rivaroxaban (XARELTO) 20 MG TABS tablet TAKE 1 TABLET BY MOUTH DAILY WITH SUPPER 90 tablet 3  . sodium chloride (OCEAN) 0.65 % SOLN nasal spray Place 1 spray into both nostrils as needed for congestion (As directed).     Marland Kitchen UNABLE TO FIND Take by mouth daily. Med Name: Citracell. Take one tablespoon    . alendronate (FOSAMAX) 70 MG tablet Take 70 mg by mouth once a week. Take with a full glass of water on an empty stomach.    . cyproheptadine (PERIACTIN) 4 MG tablet Take 1 tablet (4 mg total) by mouth at bedtime. 30 tablet 0  .  lidocaine (LIDODERM) 5 % Place 1 patch onto the skin daily. Remove & Discard patch within 12 hours or as directed by MD 5 patch 0  . Omega-3 Fatty Acids (FISH OIL) 1000 MG CAPS Take 1 capsule by mouth daily.     No facility-administered medications prior to visit.    PAST MEDICAL HISTORY: Past Medical History:  Diagnosis Date  . Adrenal insufficiency (HCC)    takes prednisone 5mg  daily  . Cervical disc disease   . CVA (cerebral infarction)  a. 11/2011  . Depression   . Diverticulosis   . GERD (gastroesophageal reflux disease)    pt denies symptoms with this  . Glaucoma   . Hyperlipidemia    a. Diagnosed 12/2011  . Hypertension   . Insomnia with sleep apnea   . Internal hemorrhoid   . Migraine headache   . Osteoarthritis   . Osteoporosis   . Persistent atrial fibrillation ([place])    a.  Diagnosed 07/2011;  b. Normal nuclear myoview 07/2011 and normal EF at that time;  c. Most recent echo 2/20 /13 - EF 55-60% wit mild-mod MR.;  d. s/p DCCV 01/12/2012  . Polymyalgia rheumatica ([place])    a. On chronic steroids  . Tubular adenoma of colon 04/2007    PAST SURGICAL HISTORY: Past Surgical History:  Procedure Laterality Date  . ATRIAL FIBRILLATION ABLATION N/A 02/16/2017   Procedure: Atrial Fibrillation Ablation;  Surgeon: Thompson Grayer, MD;  Location: [facility];  Service: Cardiovascular;  Laterality: N/A;  . BREAST LUMPECTOMY  1980's   right  x2 (benign)  . CARDIOVERSION  01/12/2012   Procedure: CARDIOVERSION;  Surgeon: Lelon Perla, MD;  Location: [place] [facility];  Service: Cardiovascular;  Laterality: N/A;  . CARDIOVERSION  03/28/2012   Procedure: CARDIOVERSION;  Surgeon: Larey Dresser, MD;  Location: [place];  Service: Cardiovascular;  Laterality: N/A;  . CARDIOVERSION  06/29/2012   Procedure: CARDIOVERSION;  Surgeon: Darlin Coco, MD;  Location: [facility] ENDOSCOPY;  Service: Cardiovascular;  Laterality: N/A;  . CARDIOVERSION N/A 12/07/2015   Procedure: CARDIOVERSION;  Surgeon: Josue Hector, MD;  Location: [facility] ENDOSCOPY;  Service: Cardiovascular;  Laterality: N/A;  . ELECTROPHYSIOLOGIC STUDY N/A 01/19/2016   AFib ablation by Dr Rayann Heman  . GLAUCOMA SURGERY    . TEE WITHOUT CARDIOVERSION N/A 01/19/2016   Procedure: TRANSESOPHAGEAL ECHOCARDIOGRAM (TEE);  Surgeon: Thayer Headings, MD;  Location: [facility] ENDOSCOPY;  Service: Cardiovascular;  Laterality: N/A;  . TONSILLECTOMY  age 57    FAMILY HISTORY: Family History  Problem Relation Age of Onset  . Stroke Mother        Deceased @ 22  . Heart failure Mother        died from  . Liver cancer Father        Deceased @ 17  . Colon cancer Paternal Grandmother     SOCIAL HISTORY: Social History   Socioeconomic History  . Marital status: Married    Spouse name: [name]  . Number of children: 3  . Years of education: college  . Highest education level: Not on file  Occupational History  . Occupation: retired     Comment: Pharmacist, hospital , [place]  Tobacco Use  . Smoking status: Never Smoker  . Smokeless tobacco: Never Used  Substance and Sexual Activity  . Alcohol use: Yes    Comment: rare  . Drug use: No  . Sexual activity: Not on file  Other Topics Concern  . Not on file  Social History Narrative   Pt lives in [place] with spouse.  [name])  She was previously a Education officer, museum.    Caffeine- None   Right handed   Patient has three children.   Patient has a college education.   Social Determinants of Health   Financial Resource Strain:   . Difficulty of Paying Living Expenses:   Food Insecurity:   . Worried About Charity fundraiser in the Last Year:   . [place] in the Last Year:  Transportation Needs:   . Film/video editor (Medical):   Marland Kitchen Lack of Transportation (Non-Medical):   Physical Activity:   . Days of Exercise per Week:   . Minutes of Exercise per Session:   Stress:   . Feeling of Stress :   Social Connections:   . Frequency of Communication with Friends and Family:   . Frequency of Social  Gatherings with Friends and Family:   . Attends Religious Services:   . Active Member of Clubs or Organizations:   . Attends Archivist Meetings:   Marland Kitchen Marital Status:   Intimate Partner Violence:   . Fear of Current or Ex-Partner:   . Emotionally Abused:   Marland Kitchen Physically Abused:   . Sexually Abused:       PHYSICAL EXAM  Vitals:   02/24/20 1345  BP: 134/82  Pulse: 78  Temp: (!) 97.4 F (36.3 C)  Weight: 123 lb (55.8 kg)  Height: 5' 1.5" (1.562 m)   Body mass index is 22.86 kg/m.  Generalized: Well developed, in no acute distress  Chest: Lungs clear to auscultation bilaterally  Neurological examination  Mentation: Alert oriented to time, place, history taking. Follows all commands speech and language fluent Cranial nerve II-XII: Extraocular movements were full, visual field were full on confrontational test Head turning and shoulder shrug  were normal and symmetric. Motor: The motor testing reveals 5 over 5 strength of all 4 extremities. Good symmetric motor tone is noted throughout.  Sensory: Sensory testing is intact to soft touch on all 4 extremities. No evidence of extinction is noted.  Gait and station: Gait is normal.    DIAGNOSTIC DATA (LABS, IMAGING, TESTING) - I reviewed patient records, labs, notes, testing and imaging myself where available.  Lab Results  Component Value Date   WBC 6.5 02/06/2017   HGB 12.6 02/06/2017   HCT 37.9 02/06/2017   MCV 92 02/06/2017   PLT 229 02/06/2017      Component Value Date/Time   NA 142 02/06/2017 1442   K 4.4 02/06/2017 1442   CL 100 02/06/2017 1442   CO2 30 (H) 02/06/2017 1442   GLUCOSE 88 02/06/2017 1442   GLUCOSE 104 (H) 06/23/2016 1145   BUN 16 02/06/2017 1442   CREATININE 0.77 02/06/2017 1442   CREATININE 1.15 (H) 01/12/2016 1247   CALCIUM 9.4 02/06/2017 1442   PROT 6.3 (L) 08/04/2015 1415   ALBUMIN 3.9 08/04/2015 1415   AST 34 08/04/2015 1415   ALT 23 08/04/2015 1415   ALKPHOS 72 08/04/2015 1415     BILITOT 0.9 08/04/2015 1415   GFRNONAA 75 02/06/2017 1442   GFRAA 86 02/06/2017 1442   Lab Results  Component Value Date   CHOL 146 08/21/2018   HDL 86 08/21/2018   LDLCALC 47 08/21/2018   TRIG 65 08/21/2018   CHOLHDL 1.7 08/21/2018   Lab Results  Component Value Date   HGBA1C 5.8 (H) 12/21/2011   No results found for: [id number] Lab Results  Component Value Date   TSH 0.302 (L) 06/23/2016      ASSESSMENT AND PLAN 79 y.o. year old female  has a past medical history of Adrenal insufficiency ([place]), Cervical disc disease, CVA (cerebral infarction), Depression, Diverticulosis, GERD (gastroesophageal reflux disease), Glaucoma, Hyperlipidemia, Hypertension, Insomnia with sleep apnea, Internal hemorrhoid, Migraine headache, Osteoarthritis, Osteoporosis, Persistent atrial fibrillation ([place]), Polymyalgia rheumatica ([place]), and Tubular adenoma of colon (04/2007). here with:  1. OSA on CPAP  - CPAP compliance excellent - Good treatment of AHI  -  Encourage patient to use CPAP nightly and > 4 hours each night  2.  Migraine headaches -Continue gabapentin 300 mg daily may need to increase to 400 mg if headaches worsen  Advised if symptoms worsen or she develops new symptoms she should let us know. F/U in 1 year or sooner if needed   I spent 25 minutes of face-to-face and non-face-to-face time with patient.  This included previsit chart review, lab review, study review, order entry, electronic health record documentation, patient education.  Ward Givens, MSN, NP-C 02/24/2020, 2:16 PM [facility] [address] [place], [place] [address] [phone]

## 2020-02-24 NOTE — Patient Instructions (Addendum)
Your Plan:  Continue CPAP Continue Gabapentin Continue to monitor symptoms If your symptoms worsen or you develop new symptoms please let us know     Thank you for coming to see [place] at [facility]. I hope we have been able to provide you high quality care today.  You may receive a patient satisfaction survey over the next few weeks. We would appreciate your feedback and comments so that we may continue to improve ourselves and the health of our patients.

## 2020-02-27 ENCOUNTER — Other Ambulatory Visit: Payer: Self-pay

## 2020-02-27 NOTE — Telephone Encounter (Signed)
This is a A-Fib clinic pt 

## 2020-03-06 ENCOUNTER — Other Ambulatory Visit: Payer: Self-pay

## 2020-03-06 MED ORDER — ATORVASTATIN CALCIUM 80 MG PO TABS: 80.0000 mg | ORAL_TABLET | Freq: Every day | ORAL | 0 refills | Status: DC

## 2020-03-06 NOTE — Telephone Encounter (Signed)
Pt's medication was sent to pt's pharmacy as requested. Confirmation received.  °

## 2020-03-09 ENCOUNTER — Ambulatory Visit: Payer: PPO | Admitting: Podiatry

## 2020-03-09 ENCOUNTER — Other Ambulatory Visit: Payer: Self-pay

## 2020-03-09 ENCOUNTER — Encounter: Payer: Self-pay | Admitting: Podiatry

## 2020-03-09 VITALS — Temp 97.2°F

## 2020-03-09 DIAGNOSIS — B351 Tinea unguium: Secondary | ICD-10-CM | POA: Diagnosis not present

## 2020-03-09 DIAGNOSIS — M79676 Pain in unspecified toe(s): Secondary | ICD-10-CM

## 2020-03-09 NOTE — Patient Instructions (Signed)

## 2020-03-15 NOTE — Progress Notes (Signed)
PCP:  Crist Infante, MD Primary Cardiologist: No primary care provider on file. Electrophysiologist: Thompson Grayer, MD   Lorraine Little is a 79 y.o. female seen today for Thompson Grayer, MD for routine electrophysiology followup.  Since last being seen in our clinic the patient reports doing well overall. Seen in Lipid clinic several years ago, and has been doing well to her knowledge since then on atorvastatin. She has mild, chronic dyspnea climbing a set up stairs, but otherwise does her activities without difficulty. Occasional lightheadedness with rapid standing, but not marked or limiting. she denies chest pain, palpitations, PND, orthopnea, nausea, vomiting, syncope, edema, weight gain, or early satiety.  Past Medical History:  Diagnosis Date  . Adrenal insufficiency (HCC)    takes prednisone 5mg  daily  . Cervical disc disease   . CVA (cerebral infarction)    a. 11/2011  . Depression   . Diverticulosis   . GERD (gastroesophageal reflux disease)    pt denies symptoms with this  . Glaucoma   . Hyperlipidemia    a. Diagnosed 12/2011  . Hypertension   . Insomnia with sleep apnea   . Internal hemorrhoid   . Migraine headache   . Osteoarthritis   . Osteoporosis   . Persistent atrial fibrillation ([place])    a.  Diagnosed 07/2011;  b. Normal nuclear myoview 07/2011 and normal EF at that time;  c. Most recent echo 2/20 /13 - EF 55-60% wit mild-mod MR.;  d. s/p DCCV 01/12/2012  . Polymyalgia rheumatica ([place])    a. On chronic steroids  . Tubular adenoma of colon 04/2007   Past Surgical History:  Procedure Laterality Date  . ATRIAL FIBRILLATION ABLATION N/A 02/16/2017   Procedure: Atrial Fibrillation Ablation;  Surgeon: Thompson Grayer, MD;  Location: [place] [facility];  Service: Cardiovascular;  Laterality: N/A;  . BREAST LUMPECTOMY  1980's   right  x2 (benign)  . CARDIOVERSION  01/12/2012   Procedure: CARDIOVERSION;  Surgeon: Lelon Perla, MD;  Location: [place];  Service: Cardiovascular;   Laterality: N/A;  . CARDIOVERSION  03/28/2012   Procedure: CARDIOVERSION;  Surgeon: Larey Dresser, MD;  Location: [place];  Service: Cardiovascular;  Laterality: N/A;  . CARDIOVERSION  06/29/2012   Procedure: CARDIOVERSION;  Surgeon: Darlin Coco, MD;  Location: [facility] ENDOSCOPY;  Service: Cardiovascular;  Laterality: N/A;  . CARDIOVERSION N/A 12/07/2015   Procedure: CARDIOVERSION;  Surgeon: Josue Hector, MD;  Location: [facility] ENDOSCOPY;  Service: Cardiovascular;  Laterality: N/A;  . ELECTROPHYSIOLOGIC STUDY N/A 01/19/2016   AFib ablation by Dr Rayann Heman  . GLAUCOMA SURGERY    . TEE WITHOUT CARDIOVERSION N/A 01/19/2016   Procedure: TRANSESOPHAGEAL ECHOCARDIOGRAM (TEE);  Surgeon: Thayer Headings, MD;  Location: [place];  Service: Cardiovascular;  Laterality: N/A;  . TONSILLECTOMY  age 46    Current Outpatient Medications  Medication Sig Dispense Refill  . Ascorbic Acid (VITAMIN C) 1000 MG tablet Take 1,000 mg by mouth daily.    Marland Kitchen atorvastatin (LIPITOR) 80 MG tablet Take 1 tablet (80 mg total) by mouth daily at 6 PM. Please make overdue appt with Dr. Rayann Heman before anymore refills. 1st attempt 30 tablet 0  . Azelaic Acid (FINACEA) 15 % cream Apply 1 application topically at bedtime. After skin is thoroughly washed and patted dry, gently but thoroughly massage a thin film of azelaic acid cream into the affected area twice daily, in the morning and evening.    . Calcium Carbonate-Vitamin D (CALCIUM 600+D HIGH POTENCY PO) Take 1  tablet by mouth daily.    . Cholecalciferol (VITAMIN D-3) 5000 UNITS TABS Take 1 tablet by mouth daily.     . Coenzyme Q10 (CO Q-10) 300 MG CAPS Take 300 mg by mouth at bedtime.    Marland Kitchen FERROUS SULFATE PO Take 165 mg by mouth at bedtime.     Marland Kitchen FIBER PO Take 2 tablets by mouth at bedtime. Chewable    . gabapentin (NEURONTIN) 100 MG capsule 1 in PM and 2 at night 360 capsule 1  . losartan (COZAAR) 100 MG tablet Take 1 tablet (100 mg total) by mouth daily. 30 tablet 11  . metoprolol  succinate (TOPROL-XL) 25 MG 24 hr tablet TAKE 0.5 TABS DAILY. TAKE 1/2 TAB FOR AFIB HR OVER 100 45 tablet 6  . omeprazole (PRILOSEC) 20 MG capsule Take 1 capsule (20 mg total) by mouth daily. 90 capsule 3  . OVER THE COUNTER MEDICATION 4 tablets daily. Tart Cherries OTC to help with arthritis    . polyethylene glycol powder (GLYCOLAX/MIRALAX) 17 GM/SCOOP powder Take by mouth.    . Polyvinyl Alcohol-Povidone (REFRESH OP) Place 1 drop into both eyes daily as needed (dry eyes).    Marland Kitchen POTASSIUM CHLORIDE PO Take by mouth 3 (three) times daily.    . predniSONE (DELTASONE) 1 MG tablet Take 4 mg by mouth daily with breakfast.     . Probiotic Product (PROBIOTIC DAILY PO) Take 1 capsule by mouth daily.    . rivaroxaban (XARELTO) 20 MG TABS tablet TAKE 1 TABLET BY MOUTH DAILY WITH SUPPER 90 tablet 3  . sodium chloride (OCEAN) 0.65 % SOLN nasal spray Place 1 spray into both nostrils as needed for congestion (As directed).     Marland Kitchen UNABLE TO FIND Take by mouth daily. Med Name: Citracell. Take one tablespoon     No current facility-administered medications for this visit.    No Known Allergies  Social History   Socioeconomic History  . Marital status: Married    Spouse name: [name]  . Number of children: 3  . Years of education: college  . Highest education level: Not on file  Occupational History  . Occupation: retired     Comment: Pharmacist, hospital , [place]  Tobacco Use  . Smoking status: Never Smoker  . Smokeless tobacco: Never Used  Substance and Sexual Activity  . Alcohol use: Yes    Comment: rare  . Drug use: No  . Sexual activity: Not on file  Other Topics Concern  . Not on file  Social History Narrative   Pt lives in [place] with spouse.  [name])  She was previously a Education officer, museum.    Caffeine- None   Right handed   Patient has three children.   Patient has a college education.   Social Determinants of Health   Financial Resource Strain:   . Difficulty of Paying Living Expenses:   Food  Insecurity:   . Worried About Charity fundraiser in the Last Year:   . Arboriculturist in the Last Year:   Transportation Needs:   . Film/video editor (Medical):   Marland Kitchen Lack of Transportation (Non-Medical):   Physical Activity:   . Days of Exercise per Week:   . Minutes of Exercise per Session:   Stress:   . Feeling of Stress :   Social Connections:   . Frequency of Communication with Friends and Family:   . Frequency of Social Gatherings with Friends and Family:   . Attends Religious Services:   .  Active Member of Clubs or Organizations:   . Attends Archivist Meetings:   Marland Kitchen Marital Status:   Intimate Partner Violence:   . Fear of Current or Ex-Partner:   . Emotionally Abused:   Marland Kitchen Physically Abused:   . Sexually Abused:      Review of Systems: General: No chills, fever, night sweats or weight changes  Cardiovascular:  No chest pain, dyspnea on exertion, edema, orthopnea, palpitations, paroxysmal nocturnal dyspnea Dermatological: No rash, lesions or masses Respiratory: No cough, dyspnea Urologic: No hematuria, dysuria Abdominal: No nausea, vomiting, diarrhea, bright red blood per rectum, melena, or hematemesis Neurologic: No visual changes, weakness, changes in mental status All other systems reviewed and are otherwise negative except as noted above.  Physical Exam: Vitals:   03/16/20 0954  BP: 116/60  Pulse: 97  Weight: 124 lb (56.2 kg)  Height: 5' 1.5" (1.562 m)    GEN- The patient is well appearing, alert and oriented x 3 today.   HEENT: normocephalic, atraumatic; sclera clear, conjunctiva pink; hearing intact; oropharynx clear; neck supple, no JVP Lymph- no cervical lymphadenopathy Lungs- Clear to ausculation bilaterally, normal work of breathing.  No wheezes, rales, rhonchi Heart- Regular rate and rhythm, no murmurs, rubs or gallops, PMI not laterally displaced GI- soft, non-tender, non-distended, bowel sounds present, no  hepatosplenomegaly Extremities- no clubbing, cyanosis, or edema; DP/PT/radial pulses 2+ bilaterally MS- no significant deformity or atrophy Skin- warm and dry, no rash or lesion Psych- euthymic mood, full affect Neuro- strength and sensation are intact  EKG is ordered. Personal review of EKG from today shows NSR at 97 bpm with mild 1st degree AV block at 214 ms, QRS 70 ms. Stable from 11/05/2019  Additional studies reviewed include: Previous office notes,   Assessment and Plan:  1. Paroxysmal atrial fibrilliaton/ectopic atrial tachycardia Maintaining NSR s/p ablation off AAD Continue Xarelto for CHA2DS2VASC of at least 6    2. HTN Continue current management  3. HLD Being managed by lifestyle modification  Previously seen in lipid clinic.  Stable lipids 05/30/2019 (scanned report) She has eaten this am, so will ask PCP to take over this management when she sees for annual visit in July. Can be seen back by lipid clinic prn.   Keep recall for AF clinic in January.   Shirley Friar, PA-C  03/16/20 10:07 AM

## 2020-03-16 ENCOUNTER — Ambulatory Visit: Payer: PPO | Admitting: Student

## 2020-03-16 ENCOUNTER — Other Ambulatory Visit: Payer: Self-pay

## 2020-03-16 VITALS — BP 116/60 | HR 97 | Ht 61.5 in | Wt 124.0 lb

## 2020-03-16 DIAGNOSIS — E785 Hyperlipidemia, unspecified: Secondary | ICD-10-CM

## 2020-03-16 DIAGNOSIS — Z79899 Other long term (current) drug therapy: Secondary | ICD-10-CM

## 2020-03-16 DIAGNOSIS — I10 Essential (primary) hypertension: Secondary | ICD-10-CM | POA: Diagnosis not present

## 2020-03-16 LAB — BASIC METABOLIC PANEL
BUN/Creatinine Ratio: 16 (ref 12–28)
BUN: 15 mg/dL (ref 8–27)
CO2: 25 mmol/L (ref 20–29)
Calcium: 9.2 mg/dL (ref 8.7–10.3)
Chloride: 99 mmol/L (ref 96–106)
Creatinine, Ser: 0.92 mg/dL (ref 0.57–1.00)
GFR calc Af Amer: 68 mL/min/{1.73_m2} (ref >59)
GFR calc non Af Amer: 59 mL/min/{1.73_m2} — ABNORMAL LOW (ref >59)
Glucose: 88 mg/dL (ref 65–99)
Potassium: 4.3 mmol/L (ref 3.5–5.2)
Sodium: 142 mmol/L (ref 134–144)

## 2020-03-16 LAB — CBC
Hematocrit: 38.3 % (ref 34.0–46.6)
Hemoglobin: 12.9 g/dL (ref 11.1–15.9)
MCH: 31.9 pg (ref 26.6–33.0)
MCHC: 33.7 g/dL (ref 31.5–35.7)
MCV: 95 fL (ref 79–97)
Platelets: 212 10*3/uL (ref 150–450)
RBC: 4.04 x10E6/uL (ref 3.77–5.28)
RDW: 12.4 % (ref 11.7–15.4)
WBC: 5.8 10*3/uL (ref 3.4–10.8)

## 2020-03-16 MED ORDER — ATORVASTATIN CALCIUM 80 MG PO TABS: 80.0000 mg | ORAL_TABLET | Freq: Every day | ORAL | 3 refills | Status: DC

## 2020-03-16 NOTE — Progress Notes (Signed)
Subjective: Lorraine Little is a 79 y.o. female patient seen today painful mycotic nails b/l that are difficult to trim. Pain interferes with ambulation. Aggravating factors include wearing enclosed shoe gear. Pain is relieved with periodic professional debridement.  She voices no new pedal concerns on today's visit.  Patient Active Problem List   Diagnosis Date Noted  . Episodic lightheadedness 07/24/2018  . Heart palpitations 07/24/2018  . Regular astigmatism of left eye 04/25/2018  . Fecal incontinence 08/25/2017  . Long term current use of anticoagulant therapy 07/19/2016  . A-fib ([place]) 01/19/2016  . Noncompliance with CPAP treatment 08/20/2015  . Mixed insomnia 08/20/2015  . Insomnia due to psychological stress 03/20/2015  . Hypertension 09/05/2014  . Gastroesophageal reflux disease 09/05/2014  . Polymyalgia rheumatica ([place]) 09/05/2014  . Dysphonia 09/05/2014  . Insomnia with sleep apnea 08/14/2014  . OSA on CPAP 02/10/2014  . Persistent insomnia 02/10/2014  . Obstructive sleep apnea syndrome 02/10/2014  . CVA (cerebral vascular accident) ([place]) 10/18/2013  . Snoring 10/18/2013  . Cerebrovascular accident ([place]) 10/18/2013  . Cerebral embolism with cerebral infarction ([place]) 06/13/2013  . Disturbance of skin sensation 06/13/2013  . 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 Skin sensation disturbance 06/13/2013  . Sinus bradycardia question chronotropic incompetence 02/22/2013  . Alopecia 02/22/2013  . Loss of hair 02/22/2013  . Tremor 12/19/2012  . Hyperlipidemia 10/03/2012  . Anatomical narrow angle 07/31/2012  . Glaucoma suspect 07/31/2012  . 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 Nuclear sclerotic cataract of both eyes 07/31/2012  . Nuclear senile cataract 07/31/2012  . Narrow angle glaucoma suspect of both eyes 07/31/2012  . Cerebral infarction ([place]) 12/21/2011  . Atrial fibrillation ([place]) 08/10/2011    Current Outpatient  Medications on File Prior to Visit  Medication Sig Dispense Refill  . Ascorbic Acid (VITAMIN C) 1000 MG tablet Take 1,000 mg by mouth daily.    Marland Kitchen atorvastatin (LIPITOR) 80 MG tablet Take 1 tablet (80 mg total) by mouth daily at 6 PM. Please make overdue appt with Dr. Rayann Heman before anymore refills. 1st attempt 30 tablet 0  . Azelaic Acid (FINACEA) 15 % cream Apply 1 application topically at bedtime. After skin is thoroughly washed and patted dry, gently but thoroughly massage a thin film of azelaic acid cream into the affected area twice daily, in the morning and evening.    . Calcium Carbonate-Vitamin D (CALCIUM 600+D HIGH POTENCY PO) Take 1 tablet by mouth daily.    . Cholecalciferol (VITAMIN D-3) 5000 UNITS TABS Take 1 tablet by mouth daily.     . Coenzyme Q10 (CO Q-10) 300 MG CAPS Take 300 mg by mouth at bedtime.    Marland Kitchen FERROUS SULFATE PO Take 165 mg by mouth at bedtime.     Marland Kitchen FIBER PO Take 2 tablets by mouth at bedtime. Chewable    . gabapentin (NEURONTIN) 100 MG capsule 1 in PM and 2 at night 360 capsule 1  . losartan (COZAAR) 100 MG tablet Take 1 tablet (100 mg total) by mouth daily. 30 tablet 11  . metoprolol succinate (TOPROL-XL) 25 MG 24 hr tablet TAKE 0.5 TABS DAILY. TAKE 1/2 TAB FOR AFIB HR OVER 100 45 tablet 6  . omeprazole (PRILOSEC) 20 MG capsule Take 1 capsule (20 mg total) by mouth daily. 90 capsule 3  . OVER THE COUNTER MEDICATION 4 tablets daily. Tart Cherries OTC to help with arthritis    . polyethylene glycol powder (GLYCOLAX/MIRALAX) 17 GM/SCOOP powder Take by mouth.    Marland Kitchen  Polyvinyl Alcohol-Povidone (REFRESH OP) Place 1 drop into both eyes daily as needed (dry eyes).    Marland Kitchen POTASSIUM CHLORIDE PO Take by mouth 3 (three) times daily.    . predniSONE (DELTASONE) 1 MG tablet Take 4 mg by mouth daily with breakfast.     . Probiotic Product (PROBIOTIC DAILY PO) Take 1 capsule by mouth daily.    . rivaroxaban (XARELTO) 20 MG TABS tablet TAKE 1 TABLET BY MOUTH DAILY WITH SUPPER 90 tablet  3  . sodium chloride (OCEAN) 0.65 % SOLN nasal spray Place 1 spray into both nostrils as needed for congestion (As directed).     Marland Kitchen UNABLE TO FIND Take by mouth daily. Med Name: Citracell. Take one tablespoon    . [DISCONTINUED] flecainide (TAMBOCOR) 100 MG tablet Take 100mg  1 tablet prn onset of atrial fib. 30 tablet 6   No current facility-administered medications on file prior to visit.    No Known Allergies  Objective: Physical Exam  General: Lorraine Little is a pleasant 79 y.o. y.o. Caucasian female, in NAD. AAO x 3.   Vascular:  Neurovascular status unchanged b/l. Capillary refill time to digits immediate b/l. Palpable DP pulses b/l. Palpable PT pulses b/l. Pedal hair present b/l. Skin temperature gradient within normal limits b/l.  Dermatological:  Pedal skin with normal turgor, texture and tone bilaterally. No open wounds bilaterally. No interdigital macerations bilaterally. Toenails 1-5 b/l elongated, dystrophic, thickened, crumbly with subungual debris and tenderness to dorsal palpation.  Musculoskeletal:  Normal muscle strength 5/5 to all lower extremity muscle groups bilaterally. No gross bony deformities bilaterally. No pain crepitus or joint limitation noted with ROM b/l.  Neurological:  Protective sensation intact 5/5 intact bilaterally with 10g monofilament b/l. Vibratory sensation intact b/l. Proprioception intact bilaterally.  Assessment and Plan:  1. Pain due to onychomycosis of toenail    -Examined patient. -No new findings. No new orders. -Toenails 1-5 b/l were debrided in length and girth with sterile nail nippers and dremel without iatrogenic bleeding.  -Patient to continue soft, supportive shoe gear daily. -Patient to report any pedal injuries to medical professional immediately. -Patient/POA to call should there be question/concern in the interim.  Return in about 3 months (around 06/09/2020).  Marzetta Board, DPM

## 2020-03-16 NOTE — Patient Instructions (Addendum)
Medication Instructions:  NONE *If you need a refill on your cardiac medications before your next appointment, please call your pharmacy*   Lab Work:  TODAY BMET CBC If you have labs (blood work) drawn today and your tests are completely normal, you will receive your results only by: Marland Kitchen MyChart Message (if you have MyChart) OR . A paper copy in the mail If you have any lab test that is abnormal or we need to change your treatment, we will call you to review the results.   Testing/Procedures: NONE   Follow-Up: At [facility], you and your health needs are our priority.  As part of our continuing mission to provide you with exceptional heart care, we have created designated Provider Care Teams.  These Care Teams include your primary Cardiologist (physician) and Advanced Practice Providers (APPs -  Physician Assistants and Nurse Practitioners) who all work together to provide you with the care you need, when you need it.    Other Instructions

## 2020-06-02 DIAGNOSIS — Z1231 Encounter for screening mammogram for malignant neoplasm of breast: Secondary | ICD-10-CM | POA: Diagnosis not present

## 2020-06-24 DIAGNOSIS — Z961 Presence of intraocular lens: Secondary | ICD-10-CM | POA: Diagnosis not present

## 2020-06-24 DIAGNOSIS — H26492 Other secondary cataract, left eye: Secondary | ICD-10-CM | POA: Diagnosis not present

## 2020-06-24 DIAGNOSIS — H2511 Age-related nuclear cataract, right eye: Secondary | ICD-10-CM | POA: Diagnosis not present

## 2020-07-07 DIAGNOSIS — M810 Age-related osteoporosis without current pathological fracture: Secondary | ICD-10-CM | POA: Diagnosis not present

## 2020-07-07 DIAGNOSIS — R946 Abnormal results of thyroid function studies: Secondary | ICD-10-CM | POA: Diagnosis not present

## 2020-07-07 DIAGNOSIS — E785 Hyperlipidemia, unspecified: Secondary | ICD-10-CM | POA: Diagnosis not present

## 2020-07-07 DIAGNOSIS — I10 Essential (primary) hypertension: Secondary | ICD-10-CM | POA: Diagnosis not present

## 2020-07-08 ENCOUNTER — Encounter: Payer: Self-pay | Admitting: Neurology

## 2020-07-13 ENCOUNTER — Encounter: Payer: Self-pay | Admitting: Adult Health

## 2020-07-13 ENCOUNTER — Ambulatory Visit: Payer: PPO | Admitting: Adult Health

## 2020-07-13 ENCOUNTER — Other Ambulatory Visit: Payer: Self-pay

## 2020-07-13 VITALS — BP 110/65 | HR 83 | Ht 61.5 in | Wt 126.2 lb

## 2020-07-13 DIAGNOSIS — G4733 Obstructive sleep apnea (adult) (pediatric): Secondary | ICD-10-CM

## 2020-07-13 DIAGNOSIS — Z9989 Dependence on other enabling machines and devices: Secondary | ICD-10-CM | POA: Diagnosis not present

## 2020-07-13 DIAGNOSIS — G4489 Other headache syndrome: Secondary | ICD-10-CM | POA: Diagnosis not present

## 2020-07-13 NOTE — Progress Notes (Signed)
PATIENT: Lorraine ESTABROOK DOB: September 20, 1941  REASON FOR VISIT: follow up HISTORY FROM: patient  HISTORY OF PRESENT ILLNESS: Today 07/13/20:  Lorraine Little is an 79 year old female with a history of obstructive sleep apnea on CPAP.  Her download indicates that she use her machine nightly for compliance of 100%.  She use her machine greater than 4 hours each night.  On average she uses her machine 7 hours and 47 minutes.  Her residual AHI is 0.4 on 5 to 10 cm of water with EPR of 1.  Leak in the 95th percentile is 38.9 L/min.    She reports that she continues to take gabapentin for headaches.  She states that she most recently had sharp shooting pains in the left temporal region.  This is not typical for her headaches.  She reports that it was very brief but severe.  Denies any visual changes.  Reports that this has happened in the past but does not happen very often.  She does have a history of PMR and is on prednisone daily.  Reports that this is not currently being managed by a rheumatologist but rather her PCP.  HISTORY 02/24/20:  Lorraine Little is an 79 year old female with a history of obstructive sleep apnea on CPAP.  Her download indicates that she use her machine nightly for compliance of 100%.  She used her machine greater than 4 hours 29 out of 30 days for compliance of 97%.  She used her machine on average 7 hours and 50 minutes.  Her residual AHI is 0.5 on 5 to 10 cm of water with EPR of 1.  Leak in the 95th percentile is 34.8 L/min.  She continues to take gabapentin for headaches.  She typically takes 300 mg daily on occasion she may take 400 if she has a headache.  She reports that she has been having sharp shooting pains behind the left ear.  These only last for seconds.  Reports that they do not occur consistently.   REVIEW OF SYSTEMS: Out of a complete 14 system review of symptoms, the patient complains only of the following symptoms, and all other reviewed systems are  negative.  FSS 25 ESS 7  ALLERGIES: No Known Allergies  HOME MEDICATIONS: Outpatient Medications Prior to Visit  Medication Sig Dispense Refill  . Ascorbic Acid (VITAMIN C) 1000 MG tablet Take 1,000 mg by mouth daily.    Marland Kitchen atorvastatin (LIPITOR) 80 MG tablet TAKE 1 TABLET (80 MG TOTAL) BY MOUTH DAILY AT 6 PM. 90 tablet 0  . atorvastatin (LIPITOR) 80 MG tablet Take 1 tablet (80 mg total) by mouth daily at 6 PM. Please make overdue appt with Dr. Rayann Heman before anymore refills. 1st attempt 90 tablet 3  . Azelaic Acid (FINACEA) 15 % cream Apply 1 application topically at bedtime. After skin is thoroughly washed and patted dry, gently but thoroughly massage a thin film of azelaic acid cream into the affected area twice daily, in the morning and evening.    . Calcium Carbonate-Vitamin D (CALCIUM 600+D HIGH POTENCY PO) Take 1 tablet by mouth daily.    . Cholecalciferol (VITAMIN D-3) 5000 UNITS TABS Take 1 tablet by mouth daily.     . Coenzyme Q10 (CO Q-10) 300 MG CAPS Take 300 mg by mouth at bedtime.    Marland Kitchen FERROUS SULFATE PO Take 165 mg by mouth at bedtime.     Marland Kitchen FIBER PO Take 2 tablets by mouth at bedtime. Chewable    . gabapentin (  NEURONTIN) 100 MG capsule 1 in PM and 2 at night 360 capsule 1  . losartan (COZAAR) 100 MG tablet Take 1 tablet (100 mg total) by mouth daily. 30 tablet 11  . metoprolol succinate (TOPROL-XL) 25 MG 24 hr tablet TAKE 0.5 TABS DAILY. TAKE 1/2 TAB FOR AFIB HR OVER 100 45 tablet 6  . omeprazole (PRILOSEC) 20 MG capsule Take 1 capsule (20 mg total) by mouth daily. 90 capsule 3  . OVER THE COUNTER MEDICATION 4 tablets daily. Tart Cherries OTC to help with arthritis    . polyethylene glycol powder (GLYCOLAX/MIRALAX) 17 GM/SCOOP powder Take by mouth.    . Polyvinyl Alcohol-Povidone (REFRESH OP) Place 1 drop into both eyes daily as needed (dry eyes).    Marland Kitchen POTASSIUM CHLORIDE PO Take by mouth 3 (three) times daily.    . predniSONE (DELTASONE) 1 MG tablet Take 4 mg by mouth daily  with breakfast.     . Probiotic Product (PROBIOTIC DAILY PO) Take 1 capsule by mouth daily.    . rivaroxaban (XARELTO) 20 MG TABS tablet TAKE 1 TABLET BY MOUTH DAILY WITH SUPPER 90 tablet 3  . sodium chloride (OCEAN) 0.65 % SOLN nasal spray Place 1 spray into both nostrils as needed for congestion (As directed).     Marland Kitchen UNABLE TO FIND Take by mouth daily. Med Name: Citracell. Take one tablespoon     No facility-administered medications prior to visit.    PAST MEDICAL HISTORY: Past Medical History:  Diagnosis Date  . Adrenal insufficiency (HCC)    takes prednisone 45m daily  . Cervical disc disease   . CVA (cerebral infarction)    a. 11/2011  . Depression   . Diverticulosis   . GERD (gastroesophageal reflux disease)    pt denies symptoms with this  . Glaucoma   . Hyperlipidemia    a. Diagnosed 12/2011  . Hypertension   . Insomnia with sleep apnea   . Internal hemorrhoid   . Migraine headache   . Osteoarthritis   . Osteoporosis   . Persistent atrial fibrillation ([place]    a.  Diagnosed 07/2011;  b. Normal nuclear myoview 07/2011 and normal EF at that time;  c. Most recent echo 2/20 /13 - EF 55-60% wit mild-mod MR.;  d. s/p DCCV 01/12/2012  . [place]umatica ([place]    a. On chronic steroids  . Tubular adenoma of colon 04/2007    PAST SURGICAL HISTORY: Past Surgical History:  Procedure Laterality Date  . ATRIAL FIBRILLATION ABLATION N/A 02/16/2017   Procedure: Atrial Fibrillation Ablation;  Surgeon: JThompson Grayer MD;  [facility];  Service: Cardiovascular;  Laterality: N/A;  . BREAST LUMPECTOMY  1980's   right  x2 (benign)  . CARDIOVERSION  01/12/2012   Procedure: CARDIOVERSION;  Surgeon: BLelon Perla MD;  [place]  Service: Cardiovascular;  Laterality: N/A;  . CARDIOVERSION  03/28/2012   Procedure: CARDIOVERSION;  Surgeon: DLarey Dresser MD;  [place] [facility]  Service: Cardiovascular;  Laterality: N/A;  . CARDIOVERSION  06/29/2012   Procedure:  CARDIOVERSION;  Surgeon: TDarlin Coco MD;  [facility];  Service: Cardiovascular;  Laterality: N/A;  . CARDIOVERSION N/A 12/07/2015   Procedure: CARDIOVERSION;  Surgeon: PJosue Hector MD;  [facility];  Service: Cardiovascular;  Laterality: N/A;  . ELECTROPHYSIOLOGIC STUDY N/A 01/19/2016   AFib ablation by Dr ARayann Heman . GLAUCOMA SURGERY    . TEE WITHOUT CARDIOVERSION N/A 01/19/2016   Procedure: TRANSESOPHAGEAL ECHOCARDIOGRAM (TEE);  Surgeon:  Thayer Headings, MD;  Location: [place];  Service: Cardiovascular;  Laterality: N/A;  . TONSILLECTOMY  age 19    FAMILY HISTORY: Family History  Problem Relation Age of Onset  . Stroke Mother        Deceased @ 80  . Heart failure Mother        died from  . Liver cancer Father        Deceased @ 25  . Colon cancer Paternal Grandmother     SOCIAL HISTORY: Social History   Socioeconomic History  . Marital status: Married    Spouse name: [name]  . Number of children: 3  . Years of education: college  . Highest education level: Not on file  Occupational History  . Occupation: retired     Comment: Pharmacist, hospital , [place]  Tobacco Use  . Smoking status: Never Smoker  . Smokeless tobacco: Never Used  Vaping Use  . Vaping Use: Never used  Substance and Sexual Activity  . Alcohol use: Yes    Comment: rare  . Drug use: No  . Sexual activity: Not on file  Other Topics Concern  . Not on file  Social History Narrative   Pt lives in [place] with spouse.  [name])  She was previously a Education officer, museum.    Caffeine- None   Right handed   Patient has three children.   Patient has a college education.   Social Determinants of Health   Financial Resource Strain:   . Difficulty of Paying Living Expenses: Not on file  Food Insecurity:   . Worried About Charity fundraiser in the Last Year: Not on file  . Ran Out of Food in the Last Year: Not on file  Transportation Needs:   . Lack of Transportation (Medical): Not on file   . Lack of Transportation (Non-Medical): Not on file  Physical Activity:   . Days of Exercise per Week: Not on file  . Minutes of Exercise per Session: Not on file  Stress:   . Feeling of Stress : Not on file  Social Connections:   . Frequency of Communication with Friends and Family: Not on file  . Frequency of Social Gatherings with Friends and Family: Not on file  . Attends Religious Services: Not on file  . Active Member of Clubs or Organizations: Not on file  . Attends Archivist Meetings: Not on file  . Marital Status: Not on file  Intimate Partner Violence:   . Fear of Current or Ex-Partner: Not on file  . Emotionally Abused: Not on file  . Physically Abused: Not on file  . Sexually Abused: Not on file      PHYSICAL EXAM  Vitals:   07/13/20 1307  BP: 110/65  Pulse: 83  Weight: 126 lb 3.2 oz (57.2 kg)  Height: 5' 1.5" (1.562 m)   Body mass index is 23.46 kg/m.  Generalized: Well developed, in no acute distress  Chest: Lungs clear to auscultation bilaterally  Neurological examination  Mentation: Alert oriented to time, place, history taking. Follows all commands speech and language fluent Cranial nerve II-XII: Extraocular movements were full, visual field were full on confrontational test Head turning and shoulder shrug  were normal and symmetric. Motor: The motor testing reveals 5 over 5 strength of all 4 extremities. Good symmetric motor tone is noted throughout.  Sensory: Sensory testing is intact to soft touch on all 4 extremities. No evidence of extinction is noted.  Gait and station: Gait  is normal.    DIAGNOSTIC DATA (LABS, IMAGING, TESTING) - I reviewed patient records, labs, notes, testing and imaging myself where available.  Lab Results  Component Value Date   WBC 5.8 03/16/2020   HGB 12.9 03/16/2020   HCT 38.3 03/16/2020   MCV 95 03/16/2020   PLT 212 03/16/2020      Component Value Date/Time   NA 142 03/16/2020 1032   K 4.3  03/16/2020 1032   CL 99 03/16/2020 1032   CO2 25 03/16/2020 1032   GLUCOSE 88 03/16/2020 1032   GLUCOSE 104 (H) 06/23/2016 1145   BUN 15 03/16/2020 1032   CREATININE 0.92 03/16/2020 1032   CREATININE 1.15 (H) 01/12/2016 1247   CALCIUM 9.2 03/16/2020 1032   PROT 6.3 (L) 08/04/2015 1415   ALBUMIN 3.9 08/04/2015 1415   AST 34 08/04/2015 1415   ALT 23 08/04/2015 1415   ALKPHOS 72 08/04/2015 1415   BILITOT 0.9 08/04/2015 1415   GFRNONAA 59 (L) 03/16/2020 1032   GFRAA 68 03/16/2020 1032   Lab Results  Component Value Date   CHOL 146 08/21/2018   HDL 86 08/21/2018   LDLCALC 47 08/21/2018   TRIG 65 08/21/2018   CHOLHDL 1.7 08/21/2018   Lab Results  Component Value Date   HGBA1C 5.8 (H) 12/21/2011   No results found for: [id number] Lab Results  Component Value Date   TSH 0.302 (L) 06/23/2016      ASSESSMENT AND PLAN 79 y.o. year old female  has a past medical history of Adrenal insufficiency ([place]), Cervical disc disease, CVA (cerebral infarction), Depression, Diverticulosis, GERD (gastroesophageal reflux disease), Glaucoma, Hyperlipidemia, Hypertension, Insomnia with sleep apnea, Internal hemorrhoid, Migraine headache, Osteoarthritis, Osteoporosis, Persistent atrial fibrillation ([place]), Polymyalgia rheumatica ([place]), and Tubular adenoma of colon (04/2007). here with:  1. OSA on CPAP  - CPAP compliance excellent - Good treatment of AHI  - Encourage patient to use CPAP nightly and > 4 hours each night   2. Headaches  - Continue gabapentin  -History of PMR--recent symptoms of sharp shooting pain in the left temporal region.  Symptoms were brief and no associated visual changes.  However due to history of PMR will check ESR and CRP   - F/U in 6 months or sooner if needed   I spent 30 minutes of face-to-face and non-face-to-face time with patient.  This included previsit chart review, lab review, study review, order entry, electronic health record documentation, patient  education.  Ward Givens, MSN, NP-C 07/13/2020, 1:33 PM [facility] [address] [place], [place] [address] [phone]

## 2020-07-13 NOTE — Patient Instructions (Signed)
Your Plan:  Continue CPAP Continue gabapentin Blood work today     Thank you for coming to see [place] at [facility]. I hope we have been able to provide you high quality care today.  You may receive a patient satisfaction survey over the next few weeks. We would appreciate your feedback and comments so that we may continue to improve ourselves and the health of our patients.

## 2020-07-14 DIAGNOSIS — E785 Hyperlipidemia, unspecified: Secondary | ICD-10-CM | POA: Diagnosis not present

## 2020-07-14 DIAGNOSIS — R05 Cough: Secondary | ICD-10-CM | POA: Diagnosis not present

## 2020-07-14 DIAGNOSIS — M810 Age-related osteoporosis without current pathological fracture: Secondary | ICD-10-CM | POA: Diagnosis not present

## 2020-07-14 DIAGNOSIS — G4733 Obstructive sleep apnea (adult) (pediatric): Secondary | ICD-10-CM | POA: Diagnosis not present

## 2020-07-14 DIAGNOSIS — Z23 Encounter for immunization: Secondary | ICD-10-CM | POA: Diagnosis not present

## 2020-07-14 DIAGNOSIS — M1990 Unspecified osteoarthritis, unspecified site: Secondary | ICD-10-CM | POA: Diagnosis not present

## 2020-07-14 DIAGNOSIS — R159 Full incontinence of feces: Secondary | ICD-10-CM | POA: Diagnosis not present

## 2020-07-14 DIAGNOSIS — D6869 Other thrombophilia: Secondary | ICD-10-CM | POA: Diagnosis not present

## 2020-07-14 DIAGNOSIS — I6340 Cerebral infarction due to embolism of unspecified cerebral artery: Secondary | ICD-10-CM | POA: Diagnosis not present

## 2020-07-14 DIAGNOSIS — I480 Paroxysmal atrial fibrillation: Secondary | ICD-10-CM | POA: Diagnosis not present

## 2020-07-14 DIAGNOSIS — R82998 Other abnormal findings in urine: Secondary | ICD-10-CM | POA: Diagnosis not present

## 2020-07-14 DIAGNOSIS — I10 Essential (primary) hypertension: Secondary | ICD-10-CM | POA: Diagnosis not present

## 2020-07-14 DIAGNOSIS — Z0000 Encounter for general adult medical examination without abnormal findings: Secondary | ICD-10-CM | POA: Diagnosis not present

## 2020-07-14 LAB — C-REACTIVE PROTEIN: CRP: 1 mg/L (ref 0–10)

## 2020-07-14 LAB — SEDIMENTATION RATE: Sed Rate: 2 mm/hr (ref 0–40)

## 2020-07-16 ENCOUNTER — Other Ambulatory Visit: Payer: Self-pay | Admitting: Internal Medicine

## 2020-07-16 DIAGNOSIS — M810 Age-related osteoporosis without current pathological fracture: Secondary | ICD-10-CM

## 2020-07-30 DIAGNOSIS — G4733 Obstructive sleep apnea (adult) (pediatric): Secondary | ICD-10-CM | POA: Diagnosis not present

## 2020-08-03 ENCOUNTER — Ambulatory Visit
Admission: RE | Admit: 2020-08-03 | Discharge: 2020-08-03 | Disposition: A | Discharge status: Home / Self Care | Payer: PPO | Source: Ambulatory Visit | Attending: Internal Medicine | Admitting: Internal Medicine

## 2020-08-03 ENCOUNTER — Other Ambulatory Visit: Payer: Self-pay

## 2020-08-03 DIAGNOSIS — M810 Age-related osteoporosis without current pathological fracture: Secondary | ICD-10-CM

## 2020-08-03 DIAGNOSIS — M85852 Other specified disorders of bone density and structure, left thigh: Secondary | ICD-10-CM | POA: Diagnosis not present

## 2020-08-03 DIAGNOSIS — Z780 Asymptomatic menopausal state: Secondary | ICD-10-CM | POA: Diagnosis not present

## 2020-08-17 ENCOUNTER — Encounter: Payer: Self-pay | Admitting: Podiatry

## 2020-08-17 ENCOUNTER — Other Ambulatory Visit: Payer: Self-pay

## 2020-08-17 ENCOUNTER — Ambulatory Visit: Payer: PPO | Admitting: Podiatry

## 2020-08-17 DIAGNOSIS — B351 Tinea unguium: Secondary | ICD-10-CM | POA: Diagnosis not present

## 2020-08-17 DIAGNOSIS — M79676 Pain in unspecified toe(s): Secondary | ICD-10-CM | POA: Diagnosis not present

## 2020-08-17 DIAGNOSIS — L600 Ingrowing nail: Secondary | ICD-10-CM

## 2020-08-20 NOTE — Progress Notes (Signed)
Subjective: Lorraine Little is a 79 y.o. female patient seen today painful mycotic nails b/l that are difficult to trim. Pain interferes with ambulation. Aggravating factors include wearing enclosed shoe gear. Pain is relieved with periodic professional debridement.  Patient states she attempted to trim her toenails and cut her right great toenail too short. She state it feels ingrown.  Patient Active Problem List   Diagnosis Date Noted  . Episodic lightheadedness 07/24/2018  . Heart palpitations 07/24/2018  . Regular astigmatism of left eye 04/25/2018  . Fecal incontinence 08/25/2017  . Long term current use of anticoagulant therapy 07/19/2016  . A-fib ([place]) 01/19/2016  . Noncompliance with CPAP treatment 08/20/2015  . Mixed insomnia 08/20/2015  . Insomnia due to psychological stress 03/20/2015  . Hypertension 09/05/2014  . Gastroesophageal reflux disease 09/05/2014  . Polymyalgia rheumatica ([place]) 09/05/2014  . Dysphonia 09/05/2014  . Insomnia with sleep apnea 08/14/2014  . OSA on CPAP 02/10/2014  . Persistent insomnia 02/10/2014  . Obstructive sleep apnea syndrome 02/10/2014  . CVA (cerebral vascular accident) ([place]) 10/18/2013  . Snoring 10/18/2013  . Cerebrovascular accident ([place]) 10/18/2013  . Cerebral embolism with cerebral infarction ([place]) 06/13/2013  . Disturbance of skin sensation 06/13/2013  . 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 Skin sensation disturbance 06/13/2013  . Sinus bradycardia question chronotropic incompetence 02/22/2013  . Alopecia 02/22/2013  . Loss of hair 02/22/2013  . Tremor 12/19/2012  . Hyperlipidemia 10/03/2012  . Anatomical narrow angle 07/31/2012  . Glaucoma suspect 07/31/2012  . 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 Nuclear sclerotic cataract of both eyes 07/31/2012  . Nuclear senile cataract 07/31/2012  . Narrow angle glaucoma suspect of both eyes 07/31/2012  . Cerebral infarction ([place]) 12/21/2011   . Atrial fibrillation ([place]) 08/10/2011    Current Outpatient Medications on File Prior to Visit  Medication Sig Dispense Refill  . Ascorbic Acid (VITAMIN C) 1000 MG tablet Take 1,000 mg by mouth daily.    Marland Kitchen atorvastatin (LIPITOR) 80 MG tablet TAKE 1 TABLET (80 MG TOTAL) BY MOUTH DAILY AT 6 PM. 90 tablet 0  . atorvastatin (LIPITOR) 80 MG tablet Take 1 tablet (80 mg total) by mouth daily at 6 PM. Please make overdue appt with Dr. Rayann Heman before anymore refills. 1st attempt 90 tablet 3  . Azelaic Acid (FINACEA) 15 % cream Apply 1 application topically at bedtime. After skin is thoroughly washed and patted dry, gently but thoroughly massage a thin film of azelaic acid cream into the affected area twice daily, in the morning and evening.    . Calcium Carbonate-Vitamin D (CALCIUM 600+D HIGH POTENCY PO) Take 1 tablet by mouth daily.    . Cholecalciferol (VITAMIN D-3) 5000 UNITS TABS Take 1 tablet by mouth daily.     . Coenzyme Q10 (CO Q-10) 300 MG CAPS Take 300 mg by mouth at bedtime.    Marland Kitchen FERROUS SULFATE PO Take 165 mg by mouth at bedtime.     Marland Kitchen FIBER PO Take 2 tablets by mouth at bedtime. Chewable    . gabapentin (NEURONTIN) 100 MG capsule 1 in PM and 2 at night 360 capsule 1  . losartan (COZAAR) 100 MG tablet Take 1 tablet (100 mg total) by mouth daily. 30 tablet 11  . metoprolol succinate (TOPROL-XL) 25 MG 24 hr tablet TAKE 0.5 TABS DAILY. TAKE 1/2 TAB FOR AFIB HR OVER 100 45 tablet 6  . omeprazole (PRILOSEC) 20 MG capsule Take 1 capsule (20 mg total) by mouth daily.  90 capsule 3  . OVER THE COUNTER MEDICATION 4 tablets daily. Tart Cherries OTC to help with arthritis    . polyethylene glycol powder (GLYCOLAX/MIRALAX) 17 GM/SCOOP powder Take by mouth.    . Polyvinyl Alcohol-Povidone (REFRESH OP) Place 1 drop into both eyes daily as needed (dry eyes).    Marland Kitchen POTASSIUM CHLORIDE PO Take by mouth 3 (three) times daily.    . prednisoLONE acetate (PRED FORTE) 1 % ophthalmic suspension Place 1 drop into  the left eye 2 (two) times daily.    . predniSONE (DELTASONE) 1 MG tablet Take 4 mg by mouth daily with breakfast.     . Probiotic Product (PROBIOTIC DAILY PO) Take 1 capsule by mouth daily.    . rivaroxaban (XARELTO) 20 MG TABS tablet TAKE 1 TABLET BY MOUTH DAILY WITH SUPPER 90 tablet 3  . sodium chloride (OCEAN) 0.65 % SOLN nasal spray Place 1 spray into both nostrils as needed for congestion (As directed).     Marland Kitchen UNABLE TO FIND Take by mouth daily. Med Name: Citracell. Take one tablespoon    . [DISCONTINUED] flecainide (TAMBOCOR) 100 MG tablet Take 100mg  1 tablet prn onset of atrial fib. 30 tablet 6   No current facility-administered medications on file prior to visit.    No Known Allergies  Objective: Physical Exam  General: Lorraine Little is a pleasant 79 y.o. y.o. Caucasian female, in NAD. AAO x 3.   Vascular:  Neurovascular status unchanged b/l. Capillary refill time to digits immediate b/l. Palpable DP pulses b/l. Palpable PT pulses b/l. Pedal hair present b/l. Skin temperature gradient within normal limits b/l.  Dermatological:  Pedal skin with normal turgor, texture and tone bilaterally. No open wounds bilaterally. No interdigital macerations bilaterally. Toenails 1-5 b/l elongated, discolored, dystrophic, thickened, crumbly with subungual debris and tenderness to dorsal palpation. Incurvated nailplate b/l border(s) R hallux.  Nail border hypertrophy absent. There is tenderness to palpation. Sign(s) of infection: no clinical signs of infection noted on examination today..  Musculoskeletal:  Normal muscle strength 5/5 to all lower extremity muscle groups bilaterally. No gross bony deformities bilaterally. No pain crepitus or joint limitation noted with ROM b/l.  Neurological:  Protective sensation intact 5/5 intact bilaterally with 10g monofilament b/l. Vibratory sensation intact b/l. Proprioception intact bilaterally.  Assessment and Plan:  1. Pain due to onychomycosis of  toenail   2. Ingrown nail    -Examined patient. -Toenails 1-5 b/l were debrided in length and girth with sterile nail nippers and dremel without iatrogenic bleeding.  -Offending nail border debrided and curretaged R hallux utilizing sterile nail nipper and currette. Border(s) cleansed with alcohol and triple antibiotic ointment applied. Patient instructed to apply TAO to R hallux once daily for 7 days. -Patient to report any pedal injuries to medical professional immediately. -Patient to continue soft, supportive shoe gear daily. -Patient/POA to call should there be question/concern in the interim.  Return in about 3 months (around 11/17/2020).  Marzetta Board, DPM

## 2020-09-04 ENCOUNTER — Other Ambulatory Visit (HOSPITAL_COMMUNITY): Payer: Self-pay | Admitting: *Deleted

## 2020-09-08 ENCOUNTER — Ambulatory Visit (HOSPITAL_COMMUNITY)
Admission: RE | Admit: 2020-09-08 | Discharge: 2020-09-08 | Disposition: A | Discharge status: Home / Self Care | Payer: PPO | Source: Ambulatory Visit | Attending: Internal Medicine | Admitting: Internal Medicine

## 2020-09-08 ENCOUNTER — Other Ambulatory Visit: Payer: Self-pay

## 2020-09-08 DIAGNOSIS — M810 Age-related osteoporosis without current pathological fracture: Secondary | ICD-10-CM | POA: Diagnosis not present

## 2020-09-08 MED ORDER — DENOSUMAB 60 MG/ML ~~LOC~~ SOSY
60.0000 mg | PREFILLED_SYRINGE | Freq: Once | SUBCUTANEOUS | Status: AC
  Administered 2020-09-08: 60 mg via SUBCUTANEOUS

## 2020-09-08 MED ORDER — DENOSUMAB 60 MG/ML ~~LOC~~ SOSY
PREFILLED_SYRINGE | SUBCUTANEOUS | Status: AC
  Filled 2020-09-08: qty 1

## 2020-09-08 NOTE — Discharge Instructions (Signed)
Denosumab injection °What is this medicine? °DENOSUMAB (den oh sue mab) slows bone breakdown. Prolia is used to treat osteoporosis in women after menopause and in men, and in people who are taking corticosteroids for 6 months or more. Xgeva is used to treat a high calcium level due to cancer and to prevent bone fractures and other bone problems caused by multiple myeloma or cancer bone metastases. Xgeva is also used to treat giant cell tumor of the bone. °This medicine may be used for other purposes; ask your health care provider or pharmacist if you have questions. °COMMON BRAND NAME(S): Prolia, XGEVA °What should I tell my health care provider before I take this medicine? °They need to know if you have any of these conditions: °· dental disease °· having surgery or tooth extraction °· infection °· kidney disease °· low levels of calcium or Vitamin D in the blood °· malnutrition °· on hemodialysis °· skin conditions or sensitivity °· thyroid or parathyroid disease °· an unusual reaction to denosumab, other medicines, foods, dyes, or preservatives °· pregnant or trying to get pregnant °· breast-feeding °How should I use this medicine? °This medicine is for injection under the skin. It is given by a health care professional in a hospital or clinic setting. °A special MedGuide will be given to you before each treatment. Be sure to read this information carefully each time. °For Prolia, talk to your pediatrician regarding the use of this medicine in children. Special care may be needed. For Xgeva, talk to your pediatrician regarding the use of this medicine in children. While this drug may be prescribed for children as young as 13 years for selected conditions, precautions do apply. °Overdosage: If you think you have taken too much of this medicine contact a poison control center or emergency room at once. °NOTE: This medicine is only for you. Do not share this medicine with others. °What if I miss a dose? °It is  important not to miss your dose. Call your doctor or health care professional if you are unable to keep an appointment. °What may interact with this medicine? °Do not take this medicine with any of the following medications: °· other medicines containing denosumab °This medicine may also interact with the following medications: °· medicines that lower your chance of fighting infection °· steroid medicines like prednisone or cortisone °This list may not describe all possible interactions. Give your health care provider a list of all the medicines, herbs, non-prescription drugs, or dietary supplements you use. Also tell them if you smoke, drink alcohol, or use illegal drugs. Some items may interact with your medicine. °What should I watch for while using this medicine? °Visit your doctor or health care professional for regular checks on your progress. Your doctor or health care professional may order blood tests and other tests to see how you are doing. °Call your doctor or health care professional for advice if you get a fever, chills or sore throat, or other symptoms of a cold or flu. Do not treat yourself. This drug may decrease your body's ability to fight infection. Try to avoid being around people who are sick. °You should make sure you get enough calcium and vitamin D while you are taking this medicine, unless your doctor tells you not to. Discuss the foods you eat and the vitamins you take with your health care professional. °See your dentist regularly. Brush and floss your teeth as directed. Before you have any dental work done, tell your dentist you are   receiving this medicine. Do not become pregnant while taking this medicine or for 5 months after stopping it. Talk with your doctor or health care professional about your birth control options while taking this medicine. Women should inform their doctor if they wish to become pregnant or think they might be pregnant. There is a potential for serious side  effects to an unborn child. Talk to your health care professional or pharmacist for more information. What side effects may I notice from receiving this medicine? Side effects that you should report to your doctor or health care professional as soon as possible:  allergic reactions like skin rash, itching or hives, swelling of the face, lips, or tongue  bone pain  breathing problems  dizziness  jaw pain, especially after dental work  redness, blistering, peeling of the skin  signs and symptoms of infection like fever or chills; cough; sore throat; pain or trouble passing urine  signs of low calcium like fast heartbeat, muscle cramps or muscle pain; pain, tingling, numbness in the hands or feet; seizures  unusual bleeding or bruising  unusually weak or tired Side effects that usually do not require medical attention (report to your doctor or health care professional if they continue or are bothersome):  constipation  diarrhea  headache  joint pain  loss of appetite  muscle pain  runny nose  tiredness  upset stomach This list may not describe all possible side effects. Call your doctor for medical advice about side effects. You may report side effects to FDA at 1-800-FDA-1088. Where should I keep my medicine? This medicine is only given in a clinic, doctor's office, or other health care setting and will not be stored at home. NOTE: This sheet is a summary. It may not cover all possible information. If you have questions about this medicine, talk to your doctor, pharmacist, or health care provider.  2020 Elsevier/Gold Standard (2018-02-23 16:10:44)

## 2020-10-01 DIAGNOSIS — G4733 Obstructive sleep apnea (adult) (pediatric): Secondary | ICD-10-CM | POA: Diagnosis not present

## 2020-11-05 ENCOUNTER — Ambulatory Visit (HOSPITAL_COMMUNITY): Payer: PPO | Admitting: Nurse Practitioner

## 2020-11-12 ENCOUNTER — Encounter (HOSPITAL_COMMUNITY): Payer: Self-pay | Admitting: Nurse Practitioner

## 2020-11-12 ENCOUNTER — Ambulatory Visit (HOSPITAL_COMMUNITY)
Admission: RE | Admit: 2020-11-12 | Discharge: 2020-11-12 | Disposition: A | Discharge status: Home / Self Care | Payer: PPO | Source: Ambulatory Visit | Attending: Nurse Practitioner | Admitting: Nurse Practitioner

## 2020-11-12 ENCOUNTER — Other Ambulatory Visit: Payer: Self-pay

## 2020-11-12 VITALS — BP 166/88 | HR 90 | Ht 61.5 in | Wt 129.0 lb

## 2020-11-12 DIAGNOSIS — I10 Essential (primary) hypertension: Secondary | ICD-10-CM | POA: Diagnosis not present

## 2020-11-12 DIAGNOSIS — Z79899 Other long term (current) drug therapy: Secondary | ICD-10-CM | POA: Insufficient documentation

## 2020-11-12 DIAGNOSIS — Z7901 Long term (current) use of anticoagulants: Secondary | ICD-10-CM | POA: Diagnosis not present

## 2020-11-12 DIAGNOSIS — D6869 Other thrombophilia: Secondary | ICD-10-CM

## 2020-11-12 DIAGNOSIS — I480 Paroxysmal atrial fibrillation: Secondary | ICD-10-CM | POA: Insufficient documentation

## 2020-11-12 MED ORDER — RIVAROXABAN 15 MG PO TABS
15.0000 mg | ORAL_TABLET | Freq: Every day | ORAL | 3 refills | Status: DC
Start: 2020-11-12 — End: 2021-01-28

## 2020-11-12 NOTE — Progress Notes (Signed)
Primary Care Physician: Crist Infante, MD Referring Physician: Dr. Caryl Comes EP: Dr. Juanetta Snow Lorraine Little is a 80 y.o. female with a h/o aifb ablation 01/19/16 and  repeat ablation 02/16/17 that is in the afib clinic for f/u.She has done very well over the last year  and is pleased that she has not had any recurrent sustained arrhythmia. Will have short spells of afib, longest 2 hours.   Continues on xarelto 20 mg with a CHA2DS2VASc score of at least 5,  crcl cal at 44.30 so will need xarelto dose reduced to 15 mg daily. She was just over 50  on visit last year.  Today, she denies symptoms of palpitations, chest pain, shortness of breath, orthopnea, PND, lower extremity edema, dizziness, presyncope, syncope, or neurologic sequela. The patient is tolerating medications without difficulties and is otherwise without complaint today.   Past Medical History:  Diagnosis Date  . Adrenal insufficiency (HCC)    takes prednisone 5mg  daily  . Cervical disc disease   . CVA (cerebral infarction)    a. 11/2011  . Depression   . Diverticulosis   . GERD (gastroesophageal reflux disease)    pt denies symptoms with this  . Glaucoma   . Hyperlipidemia    a. Diagnosed 12/2011  . Hypertension   . Insomnia with sleep apnea   . Internal hemorrhoid   . Migraine headache   . Osteoarthritis   . Osteoporosis   . Persistent atrial fibrillation ([place])    a.  Diagnosed 07/2011;  b. Normal nuclear myoview 07/2011 and normal EF at that time;  c. Most recent echo 2/20 /13 - EF 55-60% wit mild-mod MR.;  d. s/p DCCV 01/12/2012  . Polymyalgia rheumatica ([place])    a. On chronic steroids  . Tubular adenoma of colon 04/2007   Past Surgical History:  Procedure Laterality Date  . ATRIAL FIBRILLATION ABLATION N/A 02/16/2017   Procedure: Atrial Fibrillation Ablation;  Surgeon: Thompson Grayer, MD;  Location: [facility];  Service: Cardiovascular;  Laterality: N/A;  . BREAST LUMPECTOMY  1980's   right  x2 (benign)  .  CARDIOVERSION  01/12/2012   Procedure: CARDIOVERSION;  Surgeon: Lelon Perla, MD;  Location: [facility];  Service: Cardiovascular;  Laterality: N/A;  . CARDIOVERSION  03/28/2012   Procedure: CARDIOVERSION;  Surgeon: Larey Dresser, MD;  Location: [facility];  Service: Cardiovascular;  Laterality: N/A;  . CARDIOVERSION  06/29/2012   Procedure: CARDIOVERSION;  Surgeon: Darlin Coco, MD;  Location: [facility] ENDOSCOPY;  Service: Cardiovascular;  Laterality: N/A;  . CARDIOVERSION N/A 12/07/2015   Procedure: CARDIOVERSION;  Surgeon: Josue Hector, MD;  Location: [facility] ENDOSCOPY;  Service: Cardiovascular;  Laterality: N/A;  . ELECTROPHYSIOLOGIC STUDY N/A 01/19/2016   AFib ablation by Dr Rayann Heman  . GLAUCOMA SURGERY    . TEE WITHOUT CARDIOVERSION N/A 01/19/2016   Procedure: TRANSESOPHAGEAL ECHOCARDIOGRAM (TEE);  Surgeon: Thayer Headings, MD;  Location: [facility];  Service: Cardiovascular;  Laterality: N/A;  . TONSILLECTOMY  age 31    Current Outpatient Medications  Medication Sig Dispense Refill  . Ascorbic Acid (VITAMIN C) 1000 MG tablet Take 1,000 mg by mouth daily.    Marland Kitchen atorvastatin (LIPITOR) 80 MG tablet TAKE 1 TABLET (80 MG TOTAL) BY MOUTH DAILY AT 6 PM. 90 tablet 0  . atorvastatin (LIPITOR) 80 MG tablet Take 1 tablet (80 mg total) by mouth daily at 6 PM. Please make overdue appt with Dr. Rayann Heman before anymore refills. 1st attempt  90 tablet 3  . Azelaic Acid 15 % cream Apply 1 application topically at bedtime. After skin is thoroughly washed and patted dry, gently but thoroughly massage a thin film of azelaic acid cream into the affected area twice daily, in the morning and evening.    . Calcium Carbonate-Vitamin D (CALCIUM 600+D HIGH POTENCY PO) Take 1 tablet by mouth daily.    . Cholecalciferol (VITAMIN D-3) 5000 UNITS TABS Take 1 tablet by mouth daily.     . Coenzyme Q10 (CO Q-10) 300 MG CAPS Take 300 mg by mouth at bedtime.    Marland Kitchen FERROUS SULFATE PO Take 165 mg by mouth at bedtime.     Marland Kitchen FIBER PO Take 2  tablets by mouth at bedtime. Chewable    . gabapentin (NEURONTIN) 100 MG capsule 1 in PM and 2 at night (Patient taking differently: Taking 1 capsule in the AM, sometimes an extra tablet if needed during the day and 2 capsules at night) 360 capsule 1  . losartan (COZAAR) 100 MG tablet Take 1 tablet (100 mg total) by mouth daily. 30 tablet 11  . metoprolol succinate (TOPROL-XL) 25 MG 24 hr tablet TAKE 0.5 TABS DAILY. TAKE 1/2 TAB FOR AFIB HR OVER 100 45 tablet 6  . Omega-3 Fatty Acids (FISH OIL) 1000 MG CAPS Take 1 capsule by mouth daily in the afternoon.    Marland Kitchen omeprazole (PRILOSEC) 20 MG capsule Take 1 capsule (20 mg total) by mouth daily. 90 capsule 3  . OVER THE COUNTER MEDICATION 3 tablets daily. Tart Cherries OTC to help with arthritis    . polyethylene glycol powder (GLYCOLAX/MIRALAX) 17 GM/SCOOP powder Take by mouth as needed.    . predniSONE (DELTASONE) 1 MG tablet Take 3 mg by mouth daily with breakfast.    . Probiotic Product (PROBIOTIC DAILY PO) Take 1 capsule by mouth daily.    . sodium chloride (OCEAN) 0.65 % SOLN nasal spray Place 1 spray into both nostrils as needed for congestion (As directed).     Marland Kitchen UNABLE TO FIND Take by mouth daily. Med Name: Citracell. Take one tablespoon    . Rivaroxaban (XARELTO) 15 MG TABS tablet Take 1 tablet (15 mg total) by mouth daily with supper. 30 tablet 3   No current facility-administered medications for this encounter.    No Known Allergies  Social History   Socioeconomic History  . Marital status: Married    Spouse name: [name]  . Number of children: 3  . Years of education: college  . Highest education level: Not on file  Occupational History  . Occupation: retired     Comment: Pharmacist, hospital , [place]  Tobacco Use  . Smoking status: Never Smoker  . Smokeless tobacco: Never Used  Vaping Use  . Vaping Use: Never used  Substance and Sexual Activity  . Alcohol use: Yes    Comment: rare  . Drug use: No  . Sexual activity: Not on file  Other  Topics Concern  . Not on file  Social History Narrative   Pt lives in [place] with spouse.  [name])  She was previously a Education officer, museum.    Caffeine- None   Right handed   Patient has three children.   Patient has a college education.   Social Determinants of Health   Financial Resource Strain: Not on file  Food Insecurity: Not on file  Transportation Needs: Not on file  Physical Activity: Not on file  Stress: Not on file  Social Connections: Not on file  Intimate Partner Violence: Not on file    Family History  Problem Relation Age of Onset  . Stroke Mother        Deceased @ 31  . Heart failure Mother        died from  . Liver cancer Father        Deceased @ 21  . Colon cancer Paternal Grandmother     ROS- All systems are reviewed and negative except as per the HPI above  Physical Exam: Vitals:   11/12/20 1359  BP: (!) 166/88  Pulse: 90  Weight: 58.5 kg  Height: 5' 1.5" (1.562 m)    GEN- The patient is well appearing, alert and oriented x 3 today.   Head- normocephalic, atraumatic Eyes-  Sclera clear, conjunctiva pink Ears- hearing intact Oropharynx- clear Neck- supple, no JVP Lymph- no cervical lymphadenopathy Lungs- Clear to ausculation bilaterally, normal work of breathing Heart- Regular rate and rhythm, no murmurs, rubs or gallops, PMI not laterally displaced GI- soft, NT, ND, + BS Extremities- no clubbing, cyanosis, or edema MS- no significant deformity or atrophy Skin- no rash or lesion Psych- euthymic mood, full affect Neuro- strength and sensation are intact  EKG- NSR at 90 bpm, pr int 210 ms, qrs int 66 ms, qtc 440  ms   Assessment and Plan: 1. Paroxysmal afib Low afib burden since  last ablation Feels well Continue  metoprolol xl at current dose  Reduce  xarelto to 15 mg  with chadsvasc score of at least 4,   crcl cal at 44.30 so needs reduction in dose   2.  Hypertension  Elevated on presentation, recheck at 140/80  F/u afib  clinic in one year   Geroge Baseman. Staphanie Harbison, [facility] [address] [place], [place] [address] [phone]

## 2020-11-12 NOTE — Patient Instructions (Signed)
Decrease Xarelto to 15mg  once a day

## 2020-11-24 ENCOUNTER — Encounter: Payer: Self-pay | Admitting: Podiatry

## 2020-11-24 ENCOUNTER — Other Ambulatory Visit: Payer: Self-pay

## 2020-11-24 ENCOUNTER — Ambulatory Visit: Payer: PPO | Admitting: Podiatry

## 2020-11-24 DIAGNOSIS — B351 Tinea unguium: Secondary | ICD-10-CM

## 2020-11-24 DIAGNOSIS — M79676 Pain in unspecified toe(s): Secondary | ICD-10-CM | POA: Diagnosis not present

## 2020-11-25 ENCOUNTER — Other Ambulatory Visit: Payer: Self-pay | Admitting: Nurse Practitioner

## 2020-11-28 NOTE — Progress Notes (Signed)
Subjective: Lorraine Little is a 80 y.o. female patient seen today painful mycotic nails b/l that are difficult to trim. Pain interferes with ambulation. Aggravating factors include wearing enclosed shoe gear. Pain is relieved with periodic professional debridement.  She voices no new pedal concerns on today's visit.   Patient Active Problem List   Diagnosis Date Noted  . Episodic lightheadedness 07/24/2018  . Heart palpitations 07/24/2018  . Regular astigmatism of left eye 04/25/2018  . Fecal incontinence 08/25/2017  . Long term current use of anticoagulant therapy 07/19/2016  . A-fib ([place]) 01/19/2016  . Noncompliance with CPAP treatment 08/20/2015  . Mixed insomnia 08/20/2015  . Insomnia due to psychological stress 03/20/2015  . Hypertension 09/05/2014  . Gastroesophageal reflux disease 09/05/2014  . Polymyalgia rheumatica ([place]) 09/05/2014  . Dysphonia 09/05/2014  . Insomnia with sleep apnea 08/14/2014  . OSA on CPAP 02/10/2014  . Persistent insomnia 02/10/2014  . Obstructive sleep apnea syndrome 02/10/2014  . CVA (cerebral vascular accident) ([place]) 10/18/2013  . Snoring 10/18/2013  . Cerebrovascular accident ([place]) 10/18/2013  . Cerebral embolism with cerebral infarction ([place]) 06/13/2013  . Disturbance of skin sensation 06/13/2013  . 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 Skin sensation disturbance 06/13/2013  . Sinus bradycardia question chronotropic incompetence 02/22/2013  . Alopecia 02/22/2013  . Loss of hair 02/22/2013  . Tremor 12/19/2012  . Hyperlipidemia 10/03/2012  . Anatomical narrow angle 07/31/2012  . Glaucoma suspect 07/31/2012  . 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 Nuclear sclerotic cataract of both eyes 07/31/2012  . Nuclear senile cataract 07/31/2012  . Narrow angle glaucoma suspect of both eyes 07/31/2012  . Cerebral infarction ([place]) 12/21/2011  . Atrial fibrillation ([place]) 08/10/2011    Current Outpatient  Medications on File Prior to Visit  Medication Sig Dispense Refill  . Ascorbic Acid (VITAMIN C) 1000 MG tablet Take 1,000 mg by mouth daily.    Marland Kitchen atorvastatin (LIPITOR) 80 MG tablet TAKE 1 TABLET (80 MG TOTAL) BY MOUTH DAILY AT 6 PM. 90 tablet 0  . atorvastatin (LIPITOR) 80 MG tablet Take 1 tablet (80 mg total) by mouth daily at 6 PM. Please make overdue appt with Dr. Rayann Heman before anymore refills. 1st attempt 90 tablet 3  . Azelaic Acid 15 % cream Apply 1 application topically at bedtime. After skin is thoroughly washed and patted dry, gently but thoroughly massage a thin film of azelaic acid cream into the affected area twice daily, in the morning and evening.    . Calcium Carbonate-Vitamin D (CALCIUM 600+D HIGH POTENCY PO) Take 1 tablet by mouth daily.    . Cholecalciferol (VITAMIN D-3) 5000 UNITS TABS Take 1 tablet by mouth daily.     . Coenzyme Q10 (CO Q-10) 300 MG CAPS Take 300 mg by mouth at bedtime.    Marland Kitchen FERROUS SULFATE PO Take 165 mg by mouth at bedtime.     Marland Kitchen FIBER PO Take 2 tablets by mouth at bedtime. Chewable    . gabapentin (NEURONTIN) 100 MG capsule 1 in PM and 2 at night (Patient taking differently: Taking 1 capsule in the AM, sometimes an extra tablet if needed during the day and 2 capsules at night) 360 capsule 1  . metoprolol succinate (TOPROL-XL) 25 MG 24 hr tablet TAKE 0.5 TABS DAILY. TAKE 1/2 TAB FOR AFIB HR OVER 100 45 tablet 6  . Omega-3 Fatty Acids (FISH OIL) 1000 MG CAPS Take 1 capsule by mouth daily in the afternoon.    Marland Kitchen omeprazole (  PRILOSEC) 20 MG capsule Take 1 capsule (20 mg total) by mouth daily. 90 capsule 3  . OVER THE COUNTER MEDICATION 3 tablets daily. Tart Cherries OTC to help with arthritis    . polyethylene glycol powder (GLYCOLAX/MIRALAX) 17 GM/SCOOP powder Take by mouth as needed.    . predniSONE (DELTASONE) 1 MG tablet Take 3 mg by mouth daily with breakfast.    . Probiotic Product (PROBIOTIC DAILY PO) Take 1 capsule by mouth daily.    . Rivaroxaban  (XARELTO) 15 MG TABS tablet Take 1 tablet (15 mg total) by mouth daily with supper. 30 tablet 3  . sodium chloride (OCEAN) 0.65 % SOLN nasal spray Place 1 spray into both nostrils as needed for congestion (As directed).     Marland Kitchen UNABLE TO FIND Take by mouth daily. Med Name: Citracell. Take one tablespoon    . [DISCONTINUED] flecainide (TAMBOCOR) 100 MG tablet Take 100mg  1 tablet prn onset of atrial fib. 30 tablet 6   No current facility-administered medications on file prior to visit.    No Known Allergies  Objective: Physical Exam  General: Lorraine Little is a pleasant 80 y.o. y.o. Caucasian female, in NAD. AAO x 3.   Vascular:  Neurovascular status unchanged b/l. Capillary refill time to digits immediate b/l. Palpable DP pulses b/l. Palpable PT pulses b/l. Pedal hair present b/l. Skin temperature gradient within normal limits b/l.  Dermatological:  Pedal skin with normal turgor, texture and tone bilaterally. No open wounds bilaterally. No interdigital macerations bilaterally. Toenails 1-5 b/l elongated, discolored, dystrophic, thickened, crumbly with subungual debris and tenderness to dorsal palpation. Incurvated nailplate b/l border(s) R hallux.  Nail border hypertrophy absent. There is tenderness to palpation. Sign(s) of infection: no clinical signs of infection noted on examination today..  Musculoskeletal:  Normal muscle strength 5/5 to all lower extremity muscle groups bilaterally. No gross bony deformities bilaterally. No pain crepitus or joint limitation noted with ROM b/l.  Neurological:  Protective sensation intact 5/5 intact bilaterally with 10g monofilament b/l. Vibratory sensation intact b/l. Proprioception intact bilaterally.  Assessment and Plan:  1. Pain due to onychomycosis of toenail    -Examined patient. -Toenails 1-5 b/l were debrided in length and girth with sterile nail nippers and dremel without iatrogenic bleeding. Per staff, her daughter has paid for one laser  session for her toenails. They may schedule at their convenience. Will forward chart to laser technicians. -Patient to report any pedal injuries to medical professional immediately. -Patient to continue soft, supportive shoe gear daily. -Patient/POA to call should there be question/concern in the interim.  Return in about 3 months (around 02/22/2021).  Marzetta Board, DPM

## 2020-12-23 DIAGNOSIS — H40033 Anatomical narrow angle, bilateral: Secondary | ICD-10-CM | POA: Diagnosis not present

## 2020-12-23 DIAGNOSIS — H2511 Age-related nuclear cataract, right eye: Secondary | ICD-10-CM | POA: Diagnosis not present

## 2020-12-23 DIAGNOSIS — Z961 Presence of intraocular lens: Secondary | ICD-10-CM | POA: Diagnosis not present

## 2020-12-27 ENCOUNTER — Other Ambulatory Visit: Payer: Self-pay | Admitting: Neurology

## 2021-01-28 ENCOUNTER — Other Ambulatory Visit (HOSPITAL_COMMUNITY): Payer: Self-pay | Admitting: Nurse Practitioner

## 2021-01-28 ENCOUNTER — Other Ambulatory Visit (HOSPITAL_COMMUNITY): Payer: Self-pay | Admitting: *Deleted

## 2021-01-28 MED ORDER — RIVAROXABAN 15 MG PO TABS
15.0000 mg | ORAL_TABLET | Freq: Every day | ORAL | 6 refills | Status: DC
Start: 2021-01-28 — End: 2021-09-30

## 2021-02-01 DIAGNOSIS — Z961 Presence of intraocular lens: Secondary | ICD-10-CM | POA: Diagnosis not present

## 2021-02-01 DIAGNOSIS — H2511 Age-related nuclear cataract, right eye: Secondary | ICD-10-CM | POA: Diagnosis not present

## 2021-02-25 ENCOUNTER — Ambulatory Visit: Payer: PPO | Admitting: Adult Health

## 2021-03-09 ENCOUNTER — Other Ambulatory Visit: Payer: Self-pay

## 2021-03-09 ENCOUNTER — Encounter: Payer: Self-pay | Admitting: Podiatry

## 2021-03-09 ENCOUNTER — Ambulatory Visit: Payer: PPO | Admitting: Podiatry

## 2021-03-09 DIAGNOSIS — M79676 Pain in unspecified toe(s): Secondary | ICD-10-CM

## 2021-03-09 DIAGNOSIS — M79672 Pain in left foot: Secondary | ICD-10-CM | POA: Diagnosis not present

## 2021-03-09 DIAGNOSIS — Q828 Other specified congenital malformations of skin: Secondary | ICD-10-CM

## 2021-03-09 DIAGNOSIS — B351 Tinea unguium: Secondary | ICD-10-CM

## 2021-03-14 NOTE — Progress Notes (Signed)
  Subjective:  Patient ID: Lorraine Little, female    DOB: 08/21/1941,  MRN: [id number]  Lorraine Little presents to clinic today for painful thick toenails that are difficult to trim. Pain interferes with ambulation. Aggravating factors include wearing enclosed shoe gear. Pain is relieved with periodic professional debridement.   She has new c/o painful lesion plantar aspect of left foot x 1 month.. She suspects it is a wart and has been applying Compund W to lesion. It remains bothersome.   PCP is [name] and last visit was 07/14/2020.  No Known Allergies  Review of Systems: Negative except as noted in the HPI. Objective:   Constitutional Lorraine Little is a pleasant 80 y.o. Caucasian female, WD, WN in NAD. AAO x 3.   Vascular Capillary refill time to digits immediate b/l. Palpable pedal pulses b/l LE. Pedal hair present. Lower extremity skin temperature gradient within normal limits. No edema noted b/l lower extremities. No cyanosis or clubbing noted.  Neurologic Normal speech. Oriented to person, place, and time. Protective sensation intact 5/5 intact bilaterally with 10g monofilament b/l. Vibratory sensation intact b/l.  Dermatologic Pedal skin with normal turgor, texture and tone bilaterally. No open wounds bilaterally. No interdigital macerations bilaterally. Toenails 1-5 b/l elongated, discolored, dystrophic, thickened, crumbly with subungual debris and tenderness to dorsal palpation. Porokeratotic lesion(s) submet head 3 left foot. No erythema, no edema, no drainage, no fluctuance.  Orthopedic: Normal muscle strength 5/5 to all lower extremity muscle groups bilaterally. No pain crepitus or joint limitation noted with ROM b/l. No gross bony deformities bilaterally.   Radiographs: None Assessment:   1. Pain due to onychomycosis of toenail   2. Porokeratosis   3. Pain in left foot    Plan:  Patient was evaluated and treated and all questions answered.  Onychomycosis with  pain -Nails palliatively debridement as below -Educated on self-care  Procedure: Nail Debridement Rationale: Pain Type of Debridement: manual, sharp debridement. Instrumentation: Nail nipper, rotary burr. Number of Nails: 10 -Examined patient. -Patient to continue soft, supportive shoe gear daily. -Toenails 1-5 b/l were debrided in length and girth with sterile nail nippers and dremel without iatrogenic bleeding.  -Discussed lesion on plantar aspect of left foot. Advised her to discontinue using Compound W on lesion as it is an acid and could cause a wound. She related understanding. Painful porokeratotic lesion(s) submet head 3 left foot pared and enucleated with sterile scalpel blade without incident. -Patient to report any pedal injuries to medical professional immediately. -Patient/POA to call should there be question/concern in the interim.  Return in about 3 months (around 06/09/2021).  Marzetta Board, DPM

## 2021-03-23 ENCOUNTER — Other Ambulatory Visit: Payer: Self-pay | Admitting: Student

## 2021-04-29 DIAGNOSIS — E785 Hyperlipidemia, unspecified: Secondary | ICD-10-CM | POA: Diagnosis not present

## 2021-04-29 DIAGNOSIS — M810 Age-related osteoporosis without current pathological fracture: Secondary | ICD-10-CM | POA: Diagnosis not present

## 2021-04-29 DIAGNOSIS — I10 Essential (primary) hypertension: Secondary | ICD-10-CM | POA: Diagnosis not present

## 2021-05-14 ENCOUNTER — Other Ambulatory Visit (HOSPITAL_COMMUNITY): Payer: Self-pay

## 2021-05-17 ENCOUNTER — Ambulatory Visit (HOSPITAL_COMMUNITY)
Admission: RE | Admit: 2021-05-17 | Discharge: 2021-05-17 | Disposition: A | Discharge status: Home / Self Care | Payer: PPO | Source: Ambulatory Visit | Attending: Internal Medicine | Admitting: Internal Medicine

## 2021-05-17 DIAGNOSIS — M810 Age-related osteoporosis without current pathological fracture: Secondary | ICD-10-CM | POA: Insufficient documentation

## 2021-05-17 MED ORDER — DENOSUMAB 60 MG/ML ~~LOC~~ SOSY: 60.0000 mg | PREFILLED_SYRINGE | Freq: Once | SUBCUTANEOUS | Status: AC

## 2021-05-17 MED ORDER — DENOSUMAB 60 MG/ML ~~LOC~~ SOSY
PREFILLED_SYRINGE | SUBCUTANEOUS | Status: AC
  Administered 2021-05-17: 60 mg via SUBCUTANEOUS
  Filled 2021-05-17: qty 1

## 2021-05-30 DIAGNOSIS — M810 Age-related osteoporosis without current pathological fracture: Secondary | ICD-10-CM | POA: Diagnosis not present

## 2021-05-30 DIAGNOSIS — G4733 Obstructive sleep apnea (adult) (pediatric): Secondary | ICD-10-CM | POA: Diagnosis not present

## 2021-05-30 DIAGNOSIS — I10 Essential (primary) hypertension: Secondary | ICD-10-CM | POA: Diagnosis not present

## 2021-05-30 DIAGNOSIS — E785 Hyperlipidemia, unspecified: Secondary | ICD-10-CM | POA: Diagnosis not present

## 2021-06-08 DIAGNOSIS — Z1231 Encounter for screening mammogram for malignant neoplasm of breast: Secondary | ICD-10-CM | POA: Diagnosis not present

## 2021-06-16 DIAGNOSIS — R928 Other abnormal and inconclusive findings on diagnostic imaging of breast: Secondary | ICD-10-CM | POA: Diagnosis not present

## 2021-06-16 DIAGNOSIS — R922 Inconclusive mammogram: Secondary | ICD-10-CM | POA: Diagnosis not present

## 2021-06-21 ENCOUNTER — Encounter: Payer: Self-pay | Admitting: Adult Health

## 2021-06-21 ENCOUNTER — Ambulatory Visit: Payer: PPO | Admitting: Adult Health

## 2021-06-21 VITALS — BP 122/70 | HR 77 | Ht 62.0 in | Wt 128.8 lb

## 2021-06-21 DIAGNOSIS — G4489 Other headache syndrome: Secondary | ICD-10-CM | POA: Diagnosis not present

## 2021-06-21 DIAGNOSIS — Z9989 Dependence on other enabling machines and devices: Secondary | ICD-10-CM

## 2021-06-21 DIAGNOSIS — G4733 Obstructive sleep apnea (adult) (pediatric): Secondary | ICD-10-CM

## 2021-06-21 MED ORDER — GABAPENTIN 100 MG PO CAPS: ORAL_CAPSULE | ORAL | 1 refills | Status: DC

## 2021-06-21 NOTE — Patient Instructions (Signed)
Continue using CPAP nightly and greater than 4 hours each night Continue gabapentin If your symptoms worsen or you develop new symptoms please let us know.

## 2021-06-21 NOTE — Progress Notes (Signed)
PATIENT: Lorraine Little DOB: [birth year]  REASON FOR VISIT: follow up HISTORY FROM: patient  HISTORY OF PRESENT ILLNESS: Today 06/21/21:  Ms. Lorraine Little is an 80 year old female with a history of obstructive sleep apnea on CPAP and headaches.  She returns today for follow-up.  She reports that the CPAP is working well for her.  She denies any new symptoms.  Her download is below.  Patient reports that her headaches seem to be controlled with gabapentin.  She has approximately 1 headache a week.  She typically can take an extra dose of gabapentin and the headache resolves fairly quickly.  She still occasionally will get the sharp shooting pains on the left side originating from the neck up to the temporal region.  At the last visit her sedimentation rate and CRP was in normal range.  The patient patient also notices at times she feels a little off balance.  Denies any falls.  She returns today for an evaluation.    07/13/20: Ms. Lorraine Little is an 80 year old female with a history of obstructive sleep apnea on CPAP.  Her download indicates that she use her machine nightly for compliance of 100%.  She use her machine greater than 4 hours each night.  On average she uses her machine 7 hours and 47 minutes.  Her residual AHI is 0.4 on 5 to 10 cm of water with EPR of 1.  Leak in the 95th percentile is 38.9 L/min.    She reports that she continues to take gabapentin for headaches.  She states that she most recently had sharp shooting pains in the left temporal region.  This is not typical for her headaches.  She reports that it was very brief but severe.  Denies any visual changes.  Reports that this has happened in the past but does not happen very often.  She does have a history of PMR and is on prednisone daily.  Reports that this is not currently being managed by a rheumatologist but rather her PCP.  HISTORY 02/24/20:   Lorraine Little is an 80 year old female with a history of obstructive sleep apnea on  CPAP.  Her download indicates that she use her machine nightly for compliance of 100%.  She used her machine greater than 4 hours 29 out of 30 days for compliance of 97%.  She used her machine on average 7 hours and 50 minutes.  Her residual AHI is 0.5 on 5 to 10 cm of water with EPR of 1.  Leak in the 95th percentile is 34.8 L/min.   She continues to take gabapentin for headaches.  She typically takes 300 mg daily on occasion she may take 400 if she has a headache.  She reports that she has been having sharp shooting pains behind the left ear.  These only last for seconds.  Reports that they do not occur consistently.    REVIEW OF SYSTEMS: Out of a complete 14 system review of symptoms, the patient complains only of the following symptoms, and all other reviewed systems are negative.  FSS 36 ESS 8  ALLERGIES: No Known Allergies  HOME MEDICATIONS: Outpatient Medications Prior to Visit  Medication Sig Dispense Refill   Ascorbic Acid (VITAMIN C) 1000 MG tablet Take 1,000 mg by mouth daily.     atorvastatin (LIPITOR) 80 MG tablet TAKE 1 TAB DAILY AT 6 PM PLEASE MAKE OVERDUE APPT WITH DR Rayann Heman BEFORE ANYMORE REFILLS. 1ST ATTEMPT 90 tablet 3   Azelaic Acid 15 % cream Apply  1 application topically at bedtime. After skin is thoroughly washed and patted dry, gently but thoroughly massage a thin film of azelaic acid cream into the affected area twice daily, in the morning and evening.     Calcium Carbonate-Vitamin D (CALCIUM 600+D HIGH POTENCY PO) Take 1 tablet by mouth daily.     Cholecalciferol (VITAMIN D-3) 5000 UNITS TABS Take 1 tablet by mouth daily.      Coenzyme Q10 (CO Q-10) 300 MG CAPS Take 300 mg by mouth at bedtime.     FERROUS SULFATE PO Take 165 mg by mouth at bedtime.      FIBER PO Take 2 tablets by mouth at bedtime. Chewable     gabapentin (NEURONTIN) 100 MG capsule Taking 1 capsule in the AM, sometimes an extra tablet if needed during the day and 2 capsules at night 360 capsule 1    losartan (COZAAR) 100 MG tablet TAKE 1 TABLET BY MOUTH EVERY DAY 90 tablet 3   metoprolol succinate (TOPROL-XL) 25 MG 24 hr tablet TAKE 0.5 TABS DAILY. TAKE 1/2 TAB FOR AFIB HR OVER 100 135 tablet 2   moxifloxacin (VIGAMOX) 0.5 % ophthalmic solution START Vigamox 1 day prior to surgery. Use 1 drop in the right eye 4 times a day     omeprazole (PRILOSEC) 20 MG capsule Take 1 capsule (20 mg total) by mouth daily. 90 capsule 3   OVER THE COUNTER MEDICATION 3 tablets daily. Tart Cherries OTC to help with arthritis     polyethylene glycol powder (GLYCOLAX/MIRALAX) 17 GM/SCOOP powder Take by mouth as needed.     predniSONE (DELTASONE) 1 MG tablet Take 3 mg by mouth daily with breakfast.     Probiotic Product (PROBIOTIC DAILY PO) Take 1 capsule by mouth daily.     Rivaroxaban (XARELTO) 15 MG TABS tablet Take 1 tablet (15 mg total) by mouth daily with supper. 30 tablet 6   sodium chloride (OCEAN) 0.65 % SOLN nasal spray Place 1 spray into both nostrils as needed for congestion (As directed).      TART CHERRY PO 3 tablets daily. Tart Cherries OTC to help with arthritis     UNABLE TO FIND Take by mouth daily. Med Name: Citracell. Take one tablespoon     atorvastatin (LIPITOR) 80 MG tablet TAKE 1 TABLET (80 MG TOTAL) BY MOUTH DAILY AT 6 PM. 90 tablet 0   Omega-3 Fatty Acids (FISH OIL) 1000 MG CAPS Take 1 capsule by mouth daily in the afternoon.     No facility-administered medications prior to visit.    PAST MEDICAL HISTORY: Past Medical History:  Diagnosis Date   Adrenal insufficiency (HCC)    takes prednisone '5mg'$  daily   Cervical disc disease    CVA (cerebral infarction)    a. 11/2011   Depression    Diverticulosis    GERD (gastroesophageal reflux disease)    pt denies symptoms with this   Glaucoma    Hyperlipidemia    a. Diagnosed 12/2011   Hypertension    Insomnia with sleep apnea    Internal hemorrhoid    Migraine headache    Osteoarthritis    Osteoporosis    Persistent atrial  fibrillation ([place])    a.  Diagnosed 07/2011;  b. Normal nuclear myoview 07/2011 and normal EF at that time;  c. Most recent echo 2/20 /13 - EF 55-60% wit mild-mod MR.;  d. s/p DCCV 01/12/2012   Polymyalgia rheumatica ([place])    a. On chronic steroids   Tubular  adenoma of colon 04/2007    PAST SURGICAL HISTORY: Past Surgical History:  Procedure Laterality Date   ATRIAL FIBRILLATION ABLATION N/A 02/16/2017   Procedure: Atrial Fibrillation Ablation;  Surgeon: Thompson Grayer, MD;  Location: [facility];  Service: Cardiovascular;  Laterality: N/A;   BREAST LUMPECTOMY  1980's   right  x2 (benign)   CARDIOVERSION  01/12/2012   Procedure: CARDIOVERSION;  Surgeon: Lelon Perla, MD;  Location: [facility];  Service: Cardiovascular;  Laterality: N/A;   CARDIOVERSION  03/28/2012   Procedure: CARDIOVERSION;  Surgeon: Larey Dresser, MD;  Location: [facility];  Service: Cardiovascular;  Laterality: N/A;   CARDIOVERSION  06/29/2012   Procedure: CARDIOVERSION;  Surgeon: Darlin Coco, MD;  Location[facility];  Service: Cardiovascular;  Laterality: N/A;   CARDIOVERSION N/A 12/07/2015   Procedure: CARDIOVERSION;  Surgeon: Josue Hector, MD;  Location: [facility];  Service: Cardiovascular;  Laterality: N/A;   ELECTROPHYSIOLOGIC STUDY N/A 01/19/2016   AFib ablation by Dr Rayann Heman   GLAUCOMA SURGERY     TEE WITHOUT CARDIOVERSION N/A 01/19/2016   Procedure: TRANSESOPHAGEAL ECHOCARDIOGRAM (TEE);  Surgeon: Thayer Headings, MD;  Location: [facility] ENDOSCOPY;  Service: Cardiovascular;  Laterality: N/A;   TONSILLECTOMY  age 28    FAMILY HISTORY: Family History  Problem Relation Age of Onset   Stroke Mother        Deceased @ 15   Heart failure Mother        died from   Liver cancer Father        Deceased @ 76   Colon cancer Paternal Grandmother    Sleep apnea Neg Hx     SOCIAL HISTORY: Social History   Socioeconomic History   Marital status: Married    Spouse name: Dwight   Number of children: 3   Years of  education: college   Highest education level: Not on file  Occupational History   Occupation: retired     Comment: Pharmacist, hospital , [place]  Tobacco Use   Smoking status: Never   Smokeless tobacco: Never  Vaping Use   Vaping Use: Never used  Substance and Sexual Activity   Alcohol use: Yes    Comment: rare   Drug use: No   Sexual activity: Not on file  Other Topics Concern   Not on file  Social History Narrative   Pt lives in [place] with spouse.  [name])  She was previously a Education officer, museum.    Caffeine- None   Right handed   Patient has three children.   Patient has a college education.   Social Determinants of Health   Financial Resource Strain: Not on file  Food Insecurity: Not on file  Transportation Needs: Not on file  Physical Activity: Not on file  Stress: Not on file  Social Connections: Not on file  Intimate Partner Violence: Not on file      PHYSICAL EXAM  Vitals:   06/21/21 1256  BP: 122/70  Pulse: 77  Weight: 128 lb 12.8 oz (58.4 kg)  Height: '5\' 2"'$  (1.575 m)   Body mass index is 23.56 kg/m.  Generalized: Well developed, in no acute distress  Chest: Lungs clear to auscultation bilaterally  Neurological examination  Mentation: Alert oriented to time, place, history taking. Follows all commands speech and language fluent Cranial nerve II-XII: Extraocular movements were full, visual field were full on confrontational test Head turning and shoulder shrug  were normal and symmetric. Motor: The motor testing reveals 5 over 5 strength of  all 4 extremities. Good symmetric motor tone is noted throughout.  Sensory: Sensory testing is intact to soft touch on all 4 extremities. No evidence of extinction is noted.  Gait and station: Gait is normal.    DIAGNOSTIC DATA (LABS, IMAGING, TESTING) - I reviewed patient records, labs, notes, testing and imaging myself where available.  Lab Results  Component Value Date   WBC 5.8 03/16/2020   HGB 12.9 03/16/2020   HCT  38.3 03/16/2020   MCV 95 03/16/2020   PLT 212 03/16/2020      Component Value Date/Time   NA 142 03/16/2020 1032   K 4.3 03/16/2020 1032   CL 99 03/16/2020 1032   CO2 25 03/16/2020 1032   GLUCOSE 88 03/16/2020 1032   GLUCOSE 104 (H) 06/23/2016 1145   BUN 15 03/16/2020 1032   CREATININE 0.92 03/16/2020 1032   CREATININE 1.15 (H) 01/12/2016 1247   CALCIUM 9.2 03/16/2020 1032   PROT 6.3 (L) 08/04/2015 1415   ALBUMIN 3.9 08/04/2015 1415   AST 34 08/04/2015 1415   ALT 23 08/04/2015 1415   ALKPHOS 72 08/04/2015 1415   BILITOT 0.9 08/04/2015 1415   GFRNONAA 59 (L) 03/16/2020 1032   GFRAA 68 03/16/2020 1032   Lab Results  Component Value Date   CHOL 146 08/21/2018   HDL 86 08/21/2018   LDLCALC 47 08/21/2018   TRIG 65 08/21/2018   CHOLHDL 1.7 08/21/2018   Lab Results  Component Value Date   HGBA1C 5.8 (H) 12/21/2011   No results found for: [id number] Lab Results  Component Value Date   TSH 0.302 (L) 06/23/2016      ASSESSMENT AND PLAN 80 y.o. year old female  has a past medical history of Adrenal insufficiency ([place]), Cervical disc disease, CVA (cerebral infarction), Depression, Diverticulosis, GERD (gastroesophageal reflux disease), Glaucoma, Hyperlipidemia, Hypertension, Insomnia with sleep apnea, Internal hemorrhoid, Migraine headache, Osteoarthritis, Osteoporosis, Persistent atrial fibrillation ([place]), Polymyalgia rheumatica ([place]), and Tubular adenoma of colon (04/2007). here with:  OSA on CPAP  - CPAP compliance excellent - Good treatment of AHI  - Encourage patient to use CPAP nightly and > 4 hours each night   2. Headaches  - Continue gabapentin  --Patient still having sharp shooting pains originating from the left side of the neck up to the temporal region.  We discussed possible imaging however the patient deferred for now.  She stated that if her symptoms worsen or she develops new symptoms she would let us know.   - F/U in 1 year or sooner if  needed    Ward Givens, MSN, NP-C 06/21/2021, 1:28 PM [facility] [address] [place], [place] [address] [phone]

## 2021-06-21 NOTE — Progress Notes (Signed)
I have read the note, and I agree with the clinical assessment and plan.  Kendarious Gudino K Miria Cappelli   

## 2021-06-22 ENCOUNTER — Encounter: Payer: Self-pay | Admitting: Podiatry

## 2021-06-22 ENCOUNTER — Ambulatory Visit: Payer: PPO | Admitting: Podiatry

## 2021-06-22 ENCOUNTER — Other Ambulatory Visit: Payer: Self-pay

## 2021-06-22 DIAGNOSIS — M79676 Pain in unspecified toe(s): Secondary | ICD-10-CM | POA: Diagnosis not present

## 2021-06-22 DIAGNOSIS — B351 Tinea unguium: Secondary | ICD-10-CM | POA: Diagnosis not present

## 2021-06-22 DIAGNOSIS — M79672 Pain in left foot: Secondary | ICD-10-CM | POA: Diagnosis not present

## 2021-06-22 DIAGNOSIS — Q828 Other specified congenital malformations of skin: Secondary | ICD-10-CM | POA: Diagnosis not present

## 2021-06-27 NOTE — Progress Notes (Signed)
Subjective: Lorraine Little is a pleasant 80 y.o. female patient seen today for painful porokeratotic lesion(s) left foot and painful mycotic toenails that limit ambulation. Painful toenails interfere with ambulation. Aggravating factors include wearing enclosed shoe gear. Pain is relieved with periodic professional debridement. Painful porokeratotic lesions are aggravated when weightbearing with and without shoegear. Pain is relieved with periodic professional debridement.   She notes no new pedal problems on today's visit.  PCP is Crist Infante, MD. Last visit was: 07/14/2020.  No Known Allergies  Objective: Physical Exam  General: Lorraine Little is a pleasant 80 y.o. Caucasian female, in NAD. AAO x 3.   Vascular:  Capillary refill time to digits immediate b/l. Palpable DP pulse(s) b/l lower extremities Palpable PT pulse(s) b/l lower extremities Pedal hair present. Lower extremity skin temperature gradient within normal limits. No pain with calf compression b/l. No edema noted b/l lower extremities.  Dermatological:  Skin warm and supple b/l lower extremities. No open wounds b/l lower extremities. No interdigital macerations b/l lower extremities. Toenails 1-5 b/l elongated, discolored, dystrophic, thickened, crumbly with subungual debris and tenderness to dorsal palpation. Porokeratotic lesion(s) submet head 3 left foot. No erythema, no edema, no drainage, no fluctuance.  Musculoskeletal:  Normal muscle strength 5/5 to all lower extremity muscle groups bilaterally. No pain crepitus or joint limitation noted with ROM b/l lower extremities. No gross bony deformities b/l lower extremities.  Neurological:  Protective sensation intact 5/5 intact bilaterally with 10g monofilament b/l. Vibratory sensation intact b/l.  Assessment and Plan:  1. Pain due to onychomycosis of toenail   2. Porokeratosis   3. Pain in left foot      -Examined patient. -Patient to continue soft, supportive shoe  gear daily. -Toenails 1-5 b/l were debrided in length and girth with sterile nail nippers and dremel without iatrogenic bleeding.  -Painful porokeratotic lesion(s) submet head 3 left foot pared and enucleated with sterile scalpel blade without incident. Total number of lesions debrided=1. -Patient to report any pedal injuries to medical professional immediately. -Patient/POA to call should there be question/concern in the interim.  Return in about 3 months (around 09/22/2021).  Marzetta Board, DPM

## 2021-07-28 ENCOUNTER — Telehealth (HOSPITAL_COMMUNITY): Payer: Self-pay | Admitting: *Deleted

## 2021-07-28 MED ORDER — METOPROLOL SUCCINATE ER 25 MG PO TB24: 12.5000 mg | ORAL_TABLET | Freq: Two times a day (BID) | ORAL | 2 refills | Status: DC

## 2021-07-28 NOTE — Telephone Encounter (Signed)
Patient called in stating for the last week or so she has had an increased afib burden. HR is in the 80-90s. Discussed with Roderic Palau NP will increase metoprolol 1/2 tablet twice a day. She will touch base with update next week for response.

## 2021-08-18 DIAGNOSIS — E785 Hyperlipidemia, unspecified: Secondary | ICD-10-CM | POA: Diagnosis not present

## 2021-08-18 DIAGNOSIS — I10 Essential (primary) hypertension: Secondary | ICD-10-CM | POA: Diagnosis not present

## 2021-08-18 DIAGNOSIS — M810 Age-related osteoporosis without current pathological fracture: Secondary | ICD-10-CM | POA: Diagnosis not present

## 2021-08-25 DIAGNOSIS — E785 Hyperlipidemia, unspecified: Secondary | ICD-10-CM | POA: Diagnosis not present

## 2021-08-25 DIAGNOSIS — D6869 Other thrombophilia: Secondary | ICD-10-CM | POA: Diagnosis not present

## 2021-08-25 DIAGNOSIS — D126 Benign neoplasm of colon, unspecified: Secondary | ICD-10-CM | POA: Diagnosis not present

## 2021-08-25 DIAGNOSIS — I6340 Cerebral infarction due to embolism of unspecified cerebral artery: Secondary | ICD-10-CM | POA: Diagnosis not present

## 2021-08-25 DIAGNOSIS — Z0000 Encounter for general adult medical examination without abnormal findings: Secondary | ICD-10-CM | POA: Diagnosis not present

## 2021-08-25 DIAGNOSIS — M810 Age-related osteoporosis without current pathological fracture: Secondary | ICD-10-CM | POA: Diagnosis not present

## 2021-08-25 DIAGNOSIS — Z23 Encounter for immunization: Secondary | ICD-10-CM | POA: Diagnosis not present

## 2021-08-25 DIAGNOSIS — M1990 Unspecified osteoarthritis, unspecified site: Secondary | ICD-10-CM | POA: Diagnosis not present

## 2021-08-25 DIAGNOSIS — Z1389 Encounter for screening for other disorder: Secondary | ICD-10-CM | POA: Diagnosis not present

## 2021-08-25 DIAGNOSIS — Z1331 Encounter for screening for depression: Secondary | ICD-10-CM | POA: Diagnosis not present

## 2021-08-25 DIAGNOSIS — R82998 Other abnormal findings in urine: Secondary | ICD-10-CM | POA: Diagnosis not present

## 2021-08-25 DIAGNOSIS — Z7901 Long term (current) use of anticoagulants: Secondary | ICD-10-CM | POA: Diagnosis not present

## 2021-08-25 DIAGNOSIS — I10 Essential (primary) hypertension: Secondary | ICD-10-CM | POA: Diagnosis not present

## 2021-09-01 DIAGNOSIS — S81802A Unspecified open wound, left lower leg, initial encounter: Secondary | ICD-10-CM | POA: Diagnosis not present

## 2021-09-03 DIAGNOSIS — S81802A Unspecified open wound, left lower leg, initial encounter: Secondary | ICD-10-CM | POA: Diagnosis not present

## 2021-09-03 DIAGNOSIS — Z1211 Encounter for screening for malignant neoplasm of colon: Secondary | ICD-10-CM | POA: Diagnosis not present

## 2021-09-03 DIAGNOSIS — I83893 Varicose veins of bilateral lower extremities with other complications: Secondary | ICD-10-CM | POA: Diagnosis not present

## 2021-09-30 ENCOUNTER — Other Ambulatory Visit (HOSPITAL_COMMUNITY): Payer: Self-pay | Admitting: Nurse Practitioner

## 2021-10-19 ENCOUNTER — Ambulatory Visit: Payer: PPO | Admitting: Podiatry

## 2021-10-19 ENCOUNTER — Other Ambulatory Visit: Payer: Self-pay

## 2021-10-19 ENCOUNTER — Encounter: Payer: Self-pay | Admitting: Podiatry

## 2021-10-19 DIAGNOSIS — Q828 Other specified congenital malformations of skin: Secondary | ICD-10-CM

## 2021-10-19 DIAGNOSIS — M79672 Pain in left foot: Secondary | ICD-10-CM | POA: Diagnosis not present

## 2021-10-19 DIAGNOSIS — M79676 Pain in unspecified toe(s): Secondary | ICD-10-CM

## 2021-10-19 DIAGNOSIS — B351 Tinea unguium: Secondary | ICD-10-CM

## 2021-10-26 NOTE — Progress Notes (Signed)
°  Subjective:  Patient ID: Lorraine Little, female    DOB: 06-Oct-1941,  MRN: [id number]  [name] presents to clinic today for painful porokeratotic lesion(s) left foot and painful mycotic toenails that limit ambulation. Painful toenails interfere with ambulation. Aggravating factors include wearing enclosed shoe gear. Pain is relieved with periodic professional debridement. Painful porokeratotic lesions are aggravated when weightbearing with and without shoegear. Pain is relieved with periodic professional debridement.  Patient relates no new pedal problems on today's visit.  PCP is Crist Infante, MD , and last visit was 09/01/2021.  Allergies  Allergen Reactions   Ace Inhibitors     Other reaction(s): tickle cough in throat.   Estrogens     Other reaction(s): Unknown   Flecainide     Other reaction(s): blacked out and hurt her shoulder 1/13.   Pantoprazole Sodium     Other reaction(s): Unknown    Review of Systems: Negative except as noted in the HPI. Objective:   Constitutional Lorraine Little is a pleasant 80 y.o. Caucasian female, WD, WN in NAD. AAO x 3.   Vascular CFT immediate b/l LE. Palpable DP/PT pulses b/l LE. Digital hair present b/l. Skin temperature gradient WNL b/l. No pain with calf compression b/l. No edema noted b/l. No cyanosis or clubbing noted b/l LE.  Neurologic Normal speech. Oriented to person, place, and time. Protective sensation intact 5/5 intact bilaterally with 10g monofilament b/l. Vibratory sensation intact b/l.  Dermatologic Pedal integument with normal turgor, texture and tone BLE. No open wounds b/l LE. No interdigital macerations noted b/l LE. Toenails 1-5 b/l elongated, discolored, dystrophic, thickened, crumbly with subungual debris and tenderness to dorsal palpation. Porokeratotic lesion(s) submet head 3 left foot. No erythema, no edema, no drainage, no fluctuance.  Orthopedic: Muscle strength 5/5 to all lower extremity muscle groups  bilaterally. No pain, crepitus or joint limitation noted with ROM bilateral LE. No gross bony deformities bilaterally.   Radiographs: None   Assessment:  No diagnosis found. Plan:  Patient was evaluated and treated and all questions answered. Consent given for treatment as described below: -Toenails 1-5 bilaterally were debrided in length and girth with sterile nail nippers and dremel. Pinpoint bleeding of L hallux addressed with Lumicain Hemostatic Solution, cleansed with alcohol. triple antibiotic ointment applied.  -Painful porokeratotic lesion(s) submet head 3 left foot pared and enucleated with sterile scalpel blade without incident. Total number of lesions debrided=1. -Patient/POA to call should there be question/concern in the interim.  Return in about 4 months (around 02/17/2022).  Marzetta Board, DPM

## 2021-11-20 ENCOUNTER — Other Ambulatory Visit: Payer: Self-pay | Admitting: Nurse Practitioner

## 2021-11-22 ENCOUNTER — Other Ambulatory Visit (HOSPITAL_COMMUNITY): Payer: Self-pay | Admitting: *Deleted

## 2021-11-23 ENCOUNTER — Other Ambulatory Visit: Payer: Self-pay

## 2021-11-23 ENCOUNTER — Ambulatory Visit (HOSPITAL_COMMUNITY)
Admission: RE | Admit: 2021-11-23 | Discharge: 2021-11-23 | Disposition: A | Discharge status: Home / Self Care | Payer: PPO | Source: Ambulatory Visit | Attending: Internal Medicine | Admitting: Internal Medicine

## 2021-11-23 DIAGNOSIS — M810 Age-related osteoporosis without current pathological fracture: Secondary | ICD-10-CM | POA: Diagnosis not present

## 2021-11-23 MED ORDER — DENOSUMAB 60 MG/ML ~~LOC~~ SOSY
60.0000 mg | PREFILLED_SYRINGE | Freq: Once | SUBCUTANEOUS | Status: AC
  Administered 2021-11-23: 13:00:00 60 mg via SUBCUTANEOUS

## 2021-11-23 MED ORDER — DENOSUMAB 60 MG/ML ~~LOC~~ SOSY
PREFILLED_SYRINGE | SUBCUTANEOUS | Status: AC
  Filled 2021-11-23: qty 1

## 2021-11-25 ENCOUNTER — Other Ambulatory Visit: Payer: Self-pay

## 2021-11-25 ENCOUNTER — Ambulatory Visit (HOSPITAL_COMMUNITY)
Admission: RE | Admit: 2021-11-25 | Discharge: 2021-11-25 | Disposition: A | Discharge status: Home / Self Care | Payer: PPO | Source: Ambulatory Visit | Attending: Nurse Practitioner | Admitting: Nurse Practitioner

## 2021-11-25 VITALS — BP 176/74 | HR 87 | Ht 62.0 in | Wt 131.6 lb

## 2021-11-25 DIAGNOSIS — Z79899 Other long term (current) drug therapy: Secondary | ICD-10-CM | POA: Insufficient documentation

## 2021-11-25 DIAGNOSIS — D6869 Other thrombophilia: Secondary | ICD-10-CM

## 2021-11-25 DIAGNOSIS — Z8249 Family history of ischemic heart disease and other diseases of the circulatory system: Secondary | ICD-10-CM | POA: Insufficient documentation

## 2021-11-25 DIAGNOSIS — I491 Atrial premature depolarization: Secondary | ICD-10-CM | POA: Insufficient documentation

## 2021-11-25 DIAGNOSIS — I10 Essential (primary) hypertension: Secondary | ICD-10-CM | POA: Diagnosis not present

## 2021-11-25 DIAGNOSIS — Z7901 Long term (current) use of anticoagulants: Secondary | ICD-10-CM | POA: Diagnosis not present

## 2021-11-25 DIAGNOSIS — I480 Paroxysmal atrial fibrillation: Secondary | ICD-10-CM | POA: Diagnosis not present

## 2021-11-25 NOTE — Progress Notes (Signed)
Primary Care Physician: Crist Infante, MD Referring Physician: Dr. Caryl Comes EP: Dr. Juanetta Snow Lorraine Little is a 81 y.o. female with a h/o aifb ablation 01/19/16 and  repeat ablation 02/16/17 that is in the afib clinic for f/u.She has done very well over the last year  and is pleased that she has not had any recurrent sustained arrhythmia.  Continues on xarelto 15 mg daily.     Today, she denies symptoms of palpitations, chest pain, shortness of breath, orthopnea, PND, lower extremity edema, dizziness, presyncope, syncope, or neurologic sequela. The patient is tolerating medications without difficulties and is otherwise without complaint today.   Past Medical History:  Diagnosis Date   Adrenal insufficiency (HCC)    takes prednisone 5mg  daily   Cervical disc disease    CVA (cerebral infarction)    a. 11/2011   Depression    Diverticulosis    GERD (gastroesophageal reflux disease)    pt denies symptoms with this   Glaucoma    Hyperlipidemia    a. Diagnosed 12/2011   Hypertension    Insomnia with sleep apnea    Internal hemorrhoid    Migraine headache    Osteoarthritis    Osteoporosis    Persistent atrial fibrillation ([place])    a.  Diagnosed 07/2011;  b. Normal nuclear myoview 07/2011 and normal EF at that time;  c. Most recent echo 2/20 /13 - EF 55-60% wit mild-mod MR.;  d. s/p DCCV 01/12/2012   Polymyalgia rheumatica ([place])    a. On chronic steroids   Tubular adenoma of colon 04/2007   Past Surgical History:  Procedure Laterality Date   ATRIAL FIBRILLATION ABLATION N/A 02/16/2017   Procedure: Atrial Fibrillation Ablation;  Surgeon: Thompson Grayer, MD;  Location: [facility];  Service: Cardiovascular;  Laterality: N/A;   BREAST LUMPECTOMY  1980's   right  x2 (benign)   CARDIOVERSION  01/12/2012   Procedure: CARDIOVERSION;  Surgeon: Lelon Perla, MD;  Location: [place];  Service: Cardiovascular;  Laterality: N/A;   CARDIOVERSION  03/28/2012   Procedure: CARDIOVERSION;   Surgeon: Larey Dresser, MD;  Location: [place];  Service: Cardiovascular;  Laterality: N/A;   CARDIOVERSION  06/29/2012   Procedure: CARDIOVERSION;  Surgeon: Darlin Coco, MD;  Location: [facility];  Service: Cardiovascular;  Laterality: N/A;   CARDIOVERSION N/A 12/07/2015   Procedure: CARDIOVERSION;  Surgeon: Josue Hector, MD;  Location: [place];  Service: Cardiovascular;  Laterality: N/A;   ELECTROPHYSIOLOGIC STUDY N/A 01/19/2016   AFib ablation by Dr Rayann Heman   GLAUCOMA SURGERY     TEE WITHOUT CARDIOVERSION N/A 01/19/2016   Procedure: TRANSESOPHAGEAL ECHOCARDIOGRAM (TEE);  Surgeon: Thayer Headings, MD;  Location: [facility] ENDOSCOPY;  Service: Cardiovascular;  Laterality: N/A;   TONSILLECTOMY  age 50    Current Outpatient Medications  Medication Sig Dispense Refill   Ascorbic Acid (VITAMIN C) 1000 MG tablet Take 1,000 mg by mouth daily.     atorvastatin (LIPITOR) 80 MG tablet TAKE 1 TAB DAILY AT 6 PM PLEASE MAKE OVERDUE APPT WITH DR Rayann Heman BEFORE ANYMORE REFILLS. 1ST ATTEMPT 90 tablet 3   Azelaic Acid 15 % cream Apply 1 application topically at bedtime. After skin is thoroughly washed and patted dry, gently but thoroughly massage a thin film of azelaic acid cream into the affected area twice daily, in the morning and evening.     Calcium Carbonate-Vitamin D (CALCIUM 600+D HIGH POTENCY PO) Take 1 tablet by mouth daily.  Cholecalciferol (VITAMIN D-3) 5000 UNITS TABS Take 1 tablet by mouth daily.      Coenzyme Q10 (CO Q-10) 300 MG CAPS Take 300 mg by mouth at bedtime.     denosumab (PROLIA) 60 MG/ML SOSY injection Inject 60 mg into the skin every 6 (six) months.     FERROUS SULFATE PO Take 165 mg by mouth at bedtime.      FIBER PO Take 2 tablets by mouth at bedtime. Chewable     gabapentin (NEURONTIN) 100 MG capsule Taking 1 capsule in the AM, sometimes an extra tablet if needed during the day and 2 capsules at night 360 capsule 1   losartan (COZAAR) 100 MG tablet TAKE 1 TABLET BY MOUTH  EVERY DAY 90 tablet 1   metoprolol succinate (TOPROL-XL) 25 MG 24 hr tablet Take 0.5 tablets (12.5 mg total) by mouth 2 (two) times daily. 135 tablet 2   omeprazole (PRILOSEC) 20 MG capsule Take 1 capsule (20 mg total) by mouth daily. 90 capsule 3   polyethylene glycol powder (GLYCOLAX/MIRALAX) 17 GM/SCOOP powder Take by mouth as needed.     predniSONE (DELTASONE) 1 MG tablet Take 3 mg by mouth daily with breakfast.     Probiotic Product (PROBIOTIC DAILY PO) Take 1 capsule by mouth daily.     sodium chloride (OCEAN) 0.65 % SOLN nasal spray Place 1 spray into both nostrils as needed for congestion (As directed).      TART CHERRY PO Taking 2 tablets by mouth daily     UNABLE TO FIND Take by mouth daily. Med Name: Citracell. Take one tablespoon     XARELTO 15 MG TABS tablet TAKE 1 TABLET (15 MG TOTAL) BY MOUTH DAILY WITH SUPPER. 30 tablet 6   moxifloxacin (VIGAMOX) 0.5 % ophthalmic solution START Vigamox 1 day prior to surgery. Use 1 drop in the right eye 4 times a day (Patient not taking: Reported on 11/25/2021)     No current facility-administered medications for this encounter.    Allergies  Allergen Reactions   Ace Inhibitors     Other reaction(s): tickle cough in throat.   Estrogens     Other reaction(s): Unknown   Flecainide     Other reaction(s): blacked out and hurt her shoulder 1/13.   Pantoprazole Sodium     Other reaction(s): Unknown    Social History   Socioeconomic History   Marital status: Married    Spouse name: Dwight   Number of children: 3   Years of education: college   Highest education level: Not on file  Occupational History   Occupation: retired     Comment: Pharmacist, hospital , [place]  Tobacco Use   Smoking status: Never   Smokeless tobacco: Never  Vaping Use   Vaping Use: Never used  Substance and Sexual Activity   Alcohol use: Yes    Comment: rare   Drug use: No   Sexual activity: Not on file  Other Topics Concern   Not on file  Social History Narrative   Pt  lives in [place] with spouse.  [name])  She was previously a Education officer, museum.    Caffeine- None   Right handed   Patient has three children.   Patient has a college education.   Social Determinants of Health   Financial Resource Strain: Not on file  Food Insecurity: Not on file  Transportation Needs: Not on file  Physical Activity: Not on file  Stress: Not on file  Social Connections: Not on file  Intimate Partner Violence: Not on file    Family History  Problem Relation Age of Onset   Stroke Mother        Deceased @ 62   Heart failure Mother        died from   Liver cancer Father        Deceased @ 32   Colon cancer Paternal Grandmother    Sleep apnea Neg Hx     ROS- All systems are reviewed and negative except as per the HPI above  Physical Exam: Vitals:   11/25/21 1316  BP: (!) 176/74  Pulse: 87  Weight: 59.7 kg  Height: 5\' 2"  (1.575 m)    GEN- The patient is well appearing, alert and oriented x 3 today.   Head- normocephalic, atraumatic Eyes-  Sclera clear, conjunctiva pink Ears- hearing intact Oropharynx- clear Neck- supple, no JVP Lymph- no cervical lymphadenopathy Lungs- Clear to ausculation bilaterally, normal work of breathing Heart- Regular rate and rhythm, no murmurs, rubs or gallops, PMI not laterally displaced GI- soft, NT, ND, + BS Extremities- no clubbing, cyanosis, or edema MS- no significant deformity or atrophy Skin- no rash or lesion Psych- euthymic mood, full affect Neuro- strength and sensation are intact  EKG- NSR at 87 bpm, pr int 182 ms, qrs int 72 ms, qtc 438  ms   Assessment and Plan: 1. Paroxysmal afib Low afib burden since  last ablation Feels well Continue  metoprolol xl  12.5 mg bid  Continue  xarelto to 15 mg  with chadsvasc score of at least 4 Labs reviewed form PCP in Care everywhere    2.  Hypertension  Elevated on presentation, recheck at 150/84 States at home runs 726-203 systolic   F/u afib clinic in one  year   Butch Penny C. Umaima Scholten, [facility] [address] [place], [place] [address] [phone]

## 2021-11-28 DIAGNOSIS — M810 Age-related osteoporosis without current pathological fracture: Secondary | ICD-10-CM | POA: Diagnosis not present

## 2021-11-28 DIAGNOSIS — I10 Essential (primary) hypertension: Secondary | ICD-10-CM | POA: Diagnosis not present

## 2021-11-28 DIAGNOSIS — G4733 Obstructive sleep apnea (adult) (pediatric): Secondary | ICD-10-CM | POA: Diagnosis not present

## 2021-11-28 DIAGNOSIS — E785 Hyperlipidemia, unspecified: Secondary | ICD-10-CM | POA: Diagnosis not present

## 2021-12-08 DIAGNOSIS — G4733 Obstructive sleep apnea (adult) (pediatric): Secondary | ICD-10-CM | POA: Diagnosis not present

## 2022-01-03 ENCOUNTER — Other Ambulatory Visit (HOSPITAL_COMMUNITY): Payer: Self-pay | Admitting: *Deleted

## 2022-01-03 MED ORDER — METOPROLOL SUCCINATE ER 25 MG PO TB24: 12.5000 mg | ORAL_TABLET | Freq: Two times a day (BID) | ORAL | 2 refills | Status: DC

## 2022-01-10 DIAGNOSIS — M311 Thrombotic microangiopathy, unspecified: Secondary | ICD-10-CM | POA: Diagnosis not present

## 2022-01-10 DIAGNOSIS — R1084 Generalized abdominal pain: Secondary | ICD-10-CM | POA: Diagnosis not present

## 2022-01-10 DIAGNOSIS — M859 Disorder of bone density and structure, unspecified: Secondary | ICD-10-CM | POA: Diagnosis not present

## 2022-01-10 DIAGNOSIS — E60 Dietary zinc deficiency: Secondary | ICD-10-CM | POA: Diagnosis not present

## 2022-01-10 DIAGNOSIS — Z79890 Hormone replacement therapy (postmenopausal): Secondary | ICD-10-CM | POA: Diagnosis not present

## 2022-01-10 DIAGNOSIS — U099 Post covid-19 condition, unspecified: Secondary | ICD-10-CM | POA: Diagnosis not present

## 2022-01-10 DIAGNOSIS — R5382 Chronic fatigue, unspecified: Secondary | ICD-10-CM | POA: Diagnosis not present

## 2022-01-10 DIAGNOSIS — D8943 Secondary mast cell activation: Secondary | ICD-10-CM | POA: Diagnosis not present

## 2022-01-10 DIAGNOSIS — N951 Menopausal and female climacteric states: Secondary | ICD-10-CM | POA: Diagnosis not present

## 2022-01-10 DIAGNOSIS — E7211 Homocystinuria: Secondary | ICD-10-CM | POA: Diagnosis not present

## 2022-01-10 DIAGNOSIS — D696 Thrombocytopenia, unspecified: Secondary | ICD-10-CM | POA: Diagnosis not present

## 2022-01-10 DIAGNOSIS — M810 Age-related osteoporosis without current pathological fracture: Secondary | ICD-10-CM | POA: Diagnosis not present

## 2022-01-24 DIAGNOSIS — R5382 Chronic fatigue, unspecified: Secondary | ICD-10-CM | POA: Diagnosis not present

## 2022-01-24 DIAGNOSIS — M810 Age-related osteoporosis without current pathological fracture: Secondary | ICD-10-CM | POA: Diagnosis not present

## 2022-01-24 DIAGNOSIS — R1084 Generalized abdominal pain: Secondary | ICD-10-CM | POA: Diagnosis not present

## 2022-01-24 DIAGNOSIS — M859 Disorder of bone density and structure, unspecified: Secondary | ICD-10-CM | POA: Diagnosis not present

## 2022-01-24 DIAGNOSIS — E7211 Homocystinuria: Secondary | ICD-10-CM | POA: Diagnosis not present

## 2022-01-24 DIAGNOSIS — N951 Menopausal and female climacteric states: Secondary | ICD-10-CM | POA: Diagnosis not present

## 2022-01-24 DIAGNOSIS — D8943 Secondary mast cell activation: Secondary | ICD-10-CM | POA: Diagnosis not present

## 2022-01-24 DIAGNOSIS — U099 Post covid-19 condition, unspecified: Secondary | ICD-10-CM | POA: Diagnosis not present

## 2022-01-24 DIAGNOSIS — R739 Hyperglycemia, unspecified: Secondary | ICD-10-CM | POA: Diagnosis not present

## 2022-01-24 DIAGNOSIS — Z79890 Hormone replacement therapy (postmenopausal): Secondary | ICD-10-CM | POA: Diagnosis not present

## 2022-02-21 ENCOUNTER — Ambulatory Visit: Payer: PPO | Admitting: Podiatry

## 2022-02-21 ENCOUNTER — Encounter: Payer: Self-pay | Admitting: Podiatry

## 2022-02-21 DIAGNOSIS — R209 Unspecified disturbances of skin sensation: Secondary | ICD-10-CM | POA: Diagnosis not present

## 2022-02-21 DIAGNOSIS — B351 Tinea unguium: Secondary | ICD-10-CM | POA: Diagnosis not present

## 2022-02-21 DIAGNOSIS — M79676 Pain in unspecified toe(s): Secondary | ICD-10-CM | POA: Diagnosis not present

## 2022-02-28 NOTE — Progress Notes (Signed)
?  Subjective:  ?Patient ID: Lorraine Little, female    DOB: 1941/05/01,  MRN: [id number] ? ?Lorraine Little presents to clinic today for painful porokeratotic lesion(s) left foot and painful mycotic toenails that limit ambulation. Painful toenails interfere with ambulation. Aggravating factors include wearing enclosed shoe gear. Pain is relieved with periodic professional debridement. Painful porokeratotic lesions are aggravated when weightbearing with and without shoegear. Pain is relieved with periodic professional debridement. ? ?Patient relates she has been experiencing sharp, shooting pains in her feet on occasion. She relates no episodes of trauma. ? ?PCP is Crist Infante, MD , and last visit was 101/29/2023. ? ?Allergies  ?Allergen Reactions  ? Ace Inhibitors   ?  Other reaction(s): tickle cough in throat.  ? Estrogens   ?  Other reaction(s): Unknown  ? Flecainide   ?  Other reaction(s): blacked out and hurt her shoulder 1/13.  ? Pantoprazole Sodium   ?  Other reaction(s): Unknown  ? ? ?Review of Systems: Negative except as noted in the HPI. ?Objective:  ? ?Constitutional Lorraine Little is a pleasant 81 y.o. Caucasian female, WD, WN in NAD. AAO x 3.   ?Vascular CFT immediate b/l LE. Palpable DP/PT pulses b/l LE. Digital hair present b/l. Skin temperature gradient WNL b/l. No pain with calf compression b/l. No edema noted b/l. No cyanosis or clubbing noted b/l LE.  ?Neurologic Normal speech. Oriented to person, place, and time. Protective sensation intact 5/5 intact bilaterally with 10g monofilament b/l. Vibratory sensation intact b/l.  ?Dermatologic Pedal integument with normal turgor, texture and tone BLE. No open wounds b/l LE. No interdigital macerations noted b/l LE. Toenails 1-5 b/l elongated, discolored, dystrophic, thickened, crumbly with subungual debris and tenderness to dorsal palpation. Porokeratotic lesion(s) submet head 3 left foot resolved on today's visit.   ?Orthopedic: Muscle strength 5/5 to  all lower extremity muscle groups bilaterally. No pain, crepitus or joint limitation noted with ROM bilateral LE. No gross bony deformities bilaterally.  ? ?Radiographs: None ?  ?Assessment:  ? ?1. Pain due to onychomycosis of toenail   ?2. Disturbance of skin sensation   ? ?Plan:  ?Patient was evaluated and treated and all questions answered. ?Consent given for treatment as described below: ?-She is to keep a diary of episodes of sharp, shooting pain in her feet. She is on gabapentin. ?-Toenails 1-5 bilaterally were debrided in length and girth with sterile nail nippers and dremel. Pinpoint bleeding of L hallux addressed with Lumicain Hemostatic Solution, cleansed with alcohol. triple antibiotic ointment applied.  ?-Patient to continue soft, supportive shoe gear daily.  ?-Patient/POA to call should there be question/concern in the interim. ? ?Return in about 4 months (around 06/23/2022). ? ?Marzetta Board, DPM ?

## 2022-03-21 ENCOUNTER — Ambulatory Visit: Payer: PPO | Admitting: Podiatry

## 2022-03-24 ENCOUNTER — Other Ambulatory Visit: Payer: Self-pay | Admitting: Internal Medicine

## 2022-04-07 ENCOUNTER — Other Ambulatory Visit: Payer: Self-pay | Admitting: Adult Health

## 2022-05-03 ENCOUNTER — Other Ambulatory Visit (HOSPITAL_COMMUNITY): Payer: Self-pay | Admitting: Nurse Practitioner

## 2022-06-16 DIAGNOSIS — Z1231 Encounter for screening mammogram for malignant neoplasm of breast: Secondary | ICD-10-CM | POA: Diagnosis not present

## 2022-06-17 ENCOUNTER — Encounter: Payer: Self-pay | Admitting: Podiatry

## 2022-06-17 ENCOUNTER — Ambulatory Visit: Payer: PPO | Admitting: Podiatry

## 2022-06-17 DIAGNOSIS — M79676 Pain in unspecified toe(s): Secondary | ICD-10-CM

## 2022-06-17 DIAGNOSIS — B351 Tinea unguium: Secondary | ICD-10-CM

## 2022-06-17 DIAGNOSIS — Q828 Other specified congenital malformations of skin: Secondary | ICD-10-CM

## 2022-06-17 DIAGNOSIS — M79672 Pain in left foot: Secondary | ICD-10-CM

## 2022-06-22 ENCOUNTER — Encounter: Payer: Self-pay | Admitting: *Deleted

## 2022-06-22 DIAGNOSIS — E7211 Homocystinuria: Secondary | ICD-10-CM | POA: Diagnosis not present

## 2022-06-22 DIAGNOSIS — M818 Other osteoporosis without current pathological fracture: Secondary | ICD-10-CM | POA: Diagnosis not present

## 2022-06-22 DIAGNOSIS — E559 Vitamin D deficiency, unspecified: Secondary | ICD-10-CM | POA: Diagnosis not present

## 2022-06-22 DIAGNOSIS — I10 Essential (primary) hypertension: Secondary | ICD-10-CM | POA: Diagnosis not present

## 2022-06-22 DIAGNOSIS — E785 Hyperlipidemia, unspecified: Secondary | ICD-10-CM | POA: Diagnosis not present

## 2022-06-22 DIAGNOSIS — Z79890 Hormone replacement therapy (postmenopausal): Secondary | ICD-10-CM | POA: Diagnosis not present

## 2022-06-22 DIAGNOSIS — R739 Hyperglycemia, unspecified: Secondary | ICD-10-CM | POA: Diagnosis not present

## 2022-06-22 DIAGNOSIS — E60 Dietary zinc deficiency: Secondary | ICD-10-CM | POA: Diagnosis not present

## 2022-06-22 DIAGNOSIS — R790 Abnormal level of blood mineral: Secondary | ICD-10-CM | POA: Diagnosis not present

## 2022-06-22 DIAGNOSIS — N951 Menopausal and female climacteric states: Secondary | ICD-10-CM | POA: Diagnosis not present

## 2022-06-23 ENCOUNTER — Ambulatory Visit: Payer: PPO | Admitting: Adult Health

## 2022-06-23 ENCOUNTER — Encounter: Payer: Self-pay | Admitting: Adult Health

## 2022-06-23 VITALS — BP 127/75 | HR 79 | Ht 62.0 in | Wt 130.0 lb

## 2022-06-23 DIAGNOSIS — G4489 Other headache syndrome: Secondary | ICD-10-CM

## 2022-06-23 DIAGNOSIS — G4733 Obstructive sleep apnea (adult) (pediatric): Secondary | ICD-10-CM | POA: Diagnosis not present

## 2022-06-23 DIAGNOSIS — Z9989 Dependence on other enabling machines and devices: Secondary | ICD-10-CM | POA: Diagnosis not present

## 2022-06-23 NOTE — Progress Notes (Signed)
PATIENT: Lorraine Little DOB: 1941-02-10  REASON FOR VISIT: follow up HISTORY FROM: patient  Chief Complaint  Patient presents with   Follow-up    Pt in 4   Pt states  she  gets episodes where her vision get blurry and hard to walk . Pt states it feels like she is walking on a cloud      HISTORY OF PRESENT ILLNESS: Today 06/23/22:  Lorraine Little is an 81 year old female with a history of obstructive sleep apnea on CPAP and headaches.  She returns today for follow-up.  She did not bring her download with her.  She reports that CPAP is working well.  She will try to drop off her SD card by her office so we can look at a download.  In regards to her headaches she states they are pretty good. Takes gabapentin 300 mg TID. Seeing a holistic MD in [place]. Reports in August she had two episodes started while she  was sitting, started feeling weird and had trouble walking. Felt like she was walking on clouds. Only last 2-3 minutes.  Patient does not wish to have any further work-up as of now.  She plans to monitor symptoms.  06/21/21: Lorraine Little is an 81 year old female with a history of obstructive sleep apnea on CPAP and headaches.  She returns today for follow-up.  She reports that the CPAP is working well for her.  She denies any new symptoms.  Her download is below.  Patient reports that her headaches seem to be controlled with gabapentin.  She has approximately 1 headache a week.  She typically can take an extra dose of gabapentin and the headache resolves fairly quickly.  She still occasionally will get the sharp shooting pains on the left side originating from the neck up to the temporal region.  At the last visit her sedimentation rate and CRP was in normal range.  The patient patient also notices at times she feels a little off balance.  Denies any falls.  She returns today for an evaluation.    07/13/20: Lorraine Little is an 81 year old female with a history of obstructive sleep apnea  on CPAP.  Her download indicates that she use her machine nightly for compliance of 100%.  She use her machine greater than 4 hours each night.  On average she uses her machine 7 hours and 47 minutes.  Her residual AHI is 0.4 on 5 to 10 cm of water with EPR of 1.  Leak in the 95th percentile is 38.9 L/min.    She reports that she continues to take gabapentin for headaches.  She states that she most recently had sharp shooting pains in the left temporal region.  This is not typical for her headaches.  She reports that it was very brief but severe.  Denies any visual changes.  Reports that this has happened in the past but does not happen very often.  She does have a history of PMR and is on prednisone daily.  Reports that this is not currently being managed by a rheumatologist but rather her PCP.  HISTORY 02/24/20:   Lorraine Little is an 81 year old female with a history of obstructive sleep apnea on CPAP.  Her download indicates that she use her machine nightly for compliance of 100%.  She used her machine greater than 4 hours 29 out of 30 days for compliance of 97%.  She used her machine on average 7 hours and 50 minutes.  Her residual  AHI is 0.5 on 5 to 10 cm of water with EPR of 1.  Leak in the 95th percentile is 34.8 L/min.   She continues to take gabapentin for headaches.  She typically takes 300 mg daily on occasion she may take 400 if she has a headache.  She reports that she has been having sharp shooting pains behind the left ear.  These only last for seconds.  Reports that they do not occur consistently.    REVIEW OF SYSTEMS: Out of a complete 14 system review of symptoms, the patient complains only of the following symptoms, and all other reviewed systems are negative.  FSS 40 ESS 14  ALLERGIES: Allergies  Allergen Reactions   Ace Inhibitors     Other reaction(s): tickle cough in throat.   Estrogens     Other reaction(s): Unknown   Flecainide     Other reaction(s): blacked out and hurt  her shoulder 1/13.   Pantoprazole Sodium     Other reaction(s): Unknown    HOME MEDICATIONS: Outpatient Medications Prior to Visit  Medication Sig Dispense Refill   Ascorbic Acid (VITAMIN C) 1000 MG tablet Take 1,000 mg by mouth daily.     atorvastatin (LIPITOR) 80 MG tablet Take 1 tablet (80 mg total) by mouth daily. 90 tablet 1   Azelaic Acid 15 % cream Apply 1 application topically at bedtime. After skin is thoroughly washed and patted dry, gently but thoroughly massage a thin film of azelaic acid cream into the affected area twice daily, in the morning and evening.     Calcium Carbonate-Vitamin D (CALCIUM 600+D HIGH POTENCY PO) Take 1 tablet by mouth daily.     Cholecalciferol (VITAMIN D-3) 5000 UNITS TABS Take 1 tablet by mouth daily.      Coenzyme Q10 (CO Q-10) 300 MG CAPS Take 300 mg by mouth at bedtime.     denosumab (PROLIA) 60 MG/ML SOSY injection Inject 60 mg into the skin every 6 (six) months.     FIBER PO Take 2 tablets by mouth at bedtime. Chewable     gabapentin (NEURONTIN) 100 MG capsule TAKING 1 CAPSULE IN THE AM, SOMETIMES AN EXTRA TABLET IF NEEDED DURING THE DAY AND 2 CAPSULES AT NIGHT 360 capsule 0   losartan (COZAAR) 100 MG tablet TAKE 1 TABLET BY MOUTH EVERY DAY 90 tablet 2   metFORMIN (GLUCOPHAGE-XR) 500 MG 24 hr tablet Take 500 mg by mouth daily.     metoprolol succinate (TOPROL-XL) 25 MG 24 hr tablet Take 0.5 tablets (12.5 mg total) by mouth 2 (two) times daily. 90 tablet 2   moxifloxacin (VIGAMOX) 0.5 % ophthalmic solution START Vigamox 1 day prior to surgery. Use 1 drop in the right eye 4 times a day (Patient not taking: Reported on 11/25/2021)     omeprazole (PRILOSEC) 20 MG capsule Take 1 capsule (20 mg total) by mouth daily. 90 capsule 3   polyethylene glycol powder (GLYCOLAX/MIRALAX) 17 GM/SCOOP powder Take by mouth as needed.     predniSONE (DELTASONE) 1 MG tablet Take 3 mg by mouth daily with breakfast.     Probiotic Product (PROBIOTIC DAILY PO) Take 1 capsule  by mouth daily.     sodium chloride (OCEAN) 0.65 % SOLN nasal spray Place 1 spray into both nostrils as needed for congestion (As directed).      TART CHERRY PO Taking 2 tablets by mouth daily     UNABLE TO FIND Take by mouth daily. Med Name: Citracell. Take one tablespoon  XARELTO 15 MG TABS tablet TAKE 1 TABLET (15 MG TOTAL) BY MOUTH DAILY WITH SUPPER 30 tablet 7   No facility-administered medications prior to visit.    PAST MEDICAL HISTORY: Past Medical History:  Diagnosis Date   Adrenal insufficiency (HCC)    takes prednisone '5mg'$  daily   Cervical disc disease    CVA (cerebral infarction)    a. 11/2011   Depression    Diverticulosis    GERD (gastroesophageal reflux disease)    pt denies symptoms with this   Glaucoma    Hyperlipidemia    a. Diagnosed 12/2011   Hypertension    Insomnia with sleep apnea    Internal hemorrhoid    Migraine headache    Osteoarthritis    Osteoporosis    Persistent atrial fibrillation ([place])    a.  Diagnosed 07/2011;  b. Normal nuclear myoview 07/2011 and normal EF at that time;  c. Most recent echo 2/20 /13 - EF 55-60% wit mild-mod MR.;  d. s/p DCCV 01/12/2012   Polymyalgia rheumatica ([place])    a. On chronic steroids   Tubular adenoma of colon 04/2007    PAST SURGICAL HISTORY: Past Surgical History:  Procedure Laterality Date   ATRIAL FIBRILLATION ABLATION N/A 02/16/2017   Procedure: Atrial Fibrillation Ablation;  Surgeon: Thompson Grayer, MD;  Location: [place] [facility];  Service: Cardiovascular;  Laterality: N/A;   BREAST LUMPECTOMY  1980's   right  x2 (benign)   CARDIOVERSION  01/12/2012   Procedure: CARDIOVERSION;  Surgeon: Lelon Perla, MD;  Location: [place];  Service: Cardiovascular;  Laterality: N/A;   CARDIOVERSION  03/28/2012   Procedure: CARDIOVERSION;  Surgeon: Larey Dresser, MD;  Location: [place];  Service: Cardiovascular;  Laterality: N/A;   CARDIOVERSION  06/29/2012   Procedure: CARDIOVERSION;  Surgeon: Darlin Coco, MD;   Location[facility] END[facility]COPY;  Service: Cardiovascular;  Laterality: N/A;   CARDIOVERSION N/A 12/07/2015   Procedure: CARDIOVERSION;  Surgeon: Josue Hector, MD;  Location: [place];  Service: Cardiovascular;  Laterality: N/A;   ELECTROPHYSIOLOGIC STUDY N/A 01/19/2016   AFib ablation by Dr Rayann Heman   GLAUCOMA SURGERY     TEE WITHOUT CARDIOVERSION N/A 01/19/2016   Procedure: TRANSESOPHAGEAL ECHOCARDIOGRAM (TEE);  Surgeon: Thayer Headings, MD;  Location: [facility] ENDOSCOPY;  Service: Cardiovascular;  Laterality: N/A;   TONSILLECTOMY  age 3    FAMILY HISTORY: Family History  Problem Relation Age of Onset   Stroke Mother        Deceased @ 73   Heart failure Mother        died from   Liver cancer Father        Deceased @ 17   Colon cancer Paternal Grandmother    Sleep apnea Neg Hx     SOCIAL HISTORY: Social History   Socioeconomic History   Marital status: Married    Spouse name: Dwight   Number of children: 3   Years of education: college   Highest education level: Not on file  Occupational History   Occupation: retired     Comment: Pharmacist, hospital , [place]  Tobacco Use   Smoking status: Never   Smokeless tobacco: Never  Vaping Use   Vaping Use: Never used  Substance and Sexual Activity   Alcohol use: Yes    Comment: rare   Drug use: No   Sexual activity: Not on file  Other Topics Concern   Not on file  Social History Narrative   Pt lives in [place] with spouse.  (  [name])  She was previously a Education officer, museum.    Caffeine- None   Right handed   Patient has three children.   Patient has a college education.   Social Determinants of Health   Financial Resource Strain: Not on file  Food Insecurity: Not on file  Transportation Needs: Not on file  Physical Activity: Not on file  Stress: Not on file  Social Connections: Not on file  Intimate Partner Violence: Not on file      PHYSICAL EXAM  Vitals:   06/23/22 1303  BP: 127/75  Pulse: 79  Weight: 130 lb (59 kg)  Height: '5\' 2"'$   (1.575 m)    Body mass index is 23.78 kg/m.  Generalized: Well developed, in no acute distress    Neurological examination  Mentation: Alert oriented to time, place, history taking. Follows all commands speech and language fluent Cranial nerve II-XII: Extraocular movements were full, visual field were full on confrontational test Head turning and shoulder shrug  were normal and symmetric. Motor: The motor testing reveals 5 over 5 strength of all 4 extremities. Good symmetric motor tone is noted throughout.  Sensory: Sensory testing is intact to soft touch on all 4 extremities. No evidence of extinction is noted.  Gait and station: Gait is normal.    DIAGNOSTIC DATA (LABS, IMAGING, TESTING) - I reviewed patient records, labs, notes, testing and imaging myself where available.  Lab Results  Component Value Date   WBC 5.8 03/16/2020   HGB 12.9 03/16/2020   HCT 38.3 03/16/2020   MCV 95 03/16/2020   PLT 212 03/16/2020      Component Value Date/Time   NA 142 03/16/2020 1032   K 4.3 03/16/2020 1032   CL 99 03/16/2020 1032   CO2 25 03/16/2020 1032   GLUCOSE 88 03/16/2020 1032   GLUCOSE 104 (H) 06/23/2016 1145   BUN 15 03/16/2020 1032   CREATININE 0.92 03/16/2020 1032   CREATININE 1.15 (H) 01/12/2016 1247   CALCIUM 9.2 03/16/2020 1032   PROT 6.3 (L) 08/04/2015 1415   ALBUMIN 3.9 08/04/2015 1415   AST 34 08/04/2015 1415   ALT 23 08/04/2015 1415   ALKPHOS 72 08/04/2015 1415   BILITOT 0.9 08/04/2015 1415   GFRNONAA 59 (L) 03/16/2020 1032   GFRAA 68 03/16/2020 1032   Lab Results  Component Value Date   CHOL 146 08/21/2018   HDL 86 08/21/2018   LDLCALC 47 08/21/2018   TRIG 65 08/21/2018   CHOLHDL 1.7 08/21/2018   Lab Results  Component Value Date   HGBA1C 5.8 (H) 12/21/2011   No results found for: "VITAMINB12" Lab Results  Component Value Date   TSH 0.302 (L) 06/23/2016      ASSESSMENT AND PLAN 81 y.o. year old female  has a past medical history of Adrenal  insufficiency ([place]), Cervical disc disease, CVA (cerebral infarction), Depression, Diverticulosis, GERD (gastroesophageal reflux disease), Glaucoma, Hyperlipidemia, Hypertension, Insomnia with sleep apnea, Internal hemorrhoid, Migraine headache, Osteoarthritis, Osteoporosis, Persistent atrial fibrillation ([place]), Polymyalgia rheumatica ([place]), and Tubular adenoma of colon (04/2007). here with:  OSA on CPAP  - She will bring her SD card by our office to review her download - Encourage patient to use CPAP nightly and > 4 hours each night   2. Headaches  - Continue gabapentin  --Patient does not wish to have any further work-up.  - F/U in 1 year or sooner if needed    Ward Givens, MSN, NP-C 06/23/2022, 1:02 PM [facility] [address]  [place], [place] [address] [phone]

## 2022-06-26 NOTE — Progress Notes (Signed)
  Subjective:  Patient ID: Lorraine Little, female    DOB: 1941-10-16,  MRN: [id number]  81 y.o. female presents painful elongated mycotic toenails 1-5 bilaterally which are tender when wearing enclosed shoe gear. Pain is relieved with periodic professional debridement.  New problem(s): None   PCP is Crist Infante, MD , and last visit was 11/28/2021.  Allergies  Allergen Reactions   Ace Inhibitors     Other reaction(s): tickle cough in throat.   Estrogens     Other reaction(s): Unknown   Flecainide     Other reaction(s): blacked out and hurt her shoulder 1/13.   Pantoprazole Sodium     Other reaction(s): Unknown    Review of Systems: Negative except as noted in the HPI.   Objective:  Lorraine Little is a pleasant 81 y.o. female, WD, WN in NAD. AAO x 3.  Vascular Examination: Vascular status intact b/l with palpable pedal pulses. CFT immediate b/l. No edema. No pain with calf compression b/l. Skin temperature gradient WNL b/l. Pedal hair present. No edema noted b/l LE.  Neurological Examination: Sensation grossly intact b/l with 10 gram monofilament. Vibratory sensation intact b/l.   Dermatological Examination: Pedal skin with normal turgor, texture and tone b/l. Toenails 1-5 b/l thick, discolored, elongated with subungual debris and pain on dorsal palpation. Porokeratotic lesion(s) submet head 3 left foot. No erythema, no edema, no drainage, no fluctuance.  Musculoskeletal Examination: Muscle strength 5/5 to b/l LE. No pain, crepitus or joint limitation noted with ROM bilateral LE. No gross bony deformities bilaterally. Patient ambulates independent of any assistive aids.  Radiographs: None  Last A1c:       No data to display           Assessment:   1. Pain due to onychomycosis of toenail   2. Porokeratosis   3. Pain in left foot    Plan:  -Examined patient. -Patient to continue soft, supportive shoe gear daily. -Mycotic toenails 1-5 bilaterally were debrided in  length and girth with sterile nail nippers and dremel without incident. -Porokeratotic lesion(s) submet head 3 left foot pared and enucleated with sterile scalpel blade without incident. Total number of lesions debrided=1. -Patient/POA to call should there be question/concern in the interim.  Return in about 4 months (around 10/17/2022).  Lorraine Little, DPM

## 2022-06-29 ENCOUNTER — Telehealth: Payer: Self-pay | Admitting: *Deleted

## 2022-06-29 NOTE — Telephone Encounter (Signed)
Pt brought in her SD card to be downloaded and was not able to do.  With resmed down or card encrypted? Pt is to bring her machine in and will try to download with one of our sd cards. Also brought in care for [facility] health.  Colin Rhein FNP [phone].

## 2022-07-13 ENCOUNTER — Inpatient Hospital Stay (HOSPITAL_COMMUNITY)
Admission: RE | Admit: 2022-07-13 | Discharge: 2022-07-13 | Disposition: A | Discharge status: Home / Self Care | Payer: PPO | Source: Ambulatory Visit | Attending: Nurse Practitioner | Admitting: Nurse Practitioner

## 2022-07-13 ENCOUNTER — Encounter (HOSPITAL_COMMUNITY): Payer: Self-pay | Admitting: Nurse Practitioner

## 2022-07-13 ENCOUNTER — Ambulatory Visit (HOSPITAL_COMMUNITY)
Admission: RE | Admit: 2022-07-13 | Discharge: 2022-07-13 | Disposition: A | Discharge status: Home / Self Care | Payer: PPO | Source: Ambulatory Visit | Attending: Nurse Practitioner | Admitting: Nurse Practitioner

## 2022-07-13 VITALS — BP 132/66 | HR 82 | Ht 62.0 in | Wt 132.4 lb

## 2022-07-13 DIAGNOSIS — I480 Paroxysmal atrial fibrillation: Secondary | ICD-10-CM | POA: Diagnosis not present

## 2022-07-13 DIAGNOSIS — Z7901 Long term (current) use of anticoagulants: Secondary | ICD-10-CM | POA: Diagnosis not present

## 2022-07-13 DIAGNOSIS — D6869 Other thrombophilia: Secondary | ICD-10-CM

## 2022-07-13 DIAGNOSIS — R42 Dizziness and giddiness: Secondary | ICD-10-CM

## 2022-07-13 DIAGNOSIS — I10 Essential (primary) hypertension: Secondary | ICD-10-CM | POA: Diagnosis not present

## 2022-07-13 LAB — BASIC METABOLIC PANEL
Anion gap: 6 (ref 5–15)
BUN: 15 mg/dL (ref 8–23)
CO2: 29 mmol/L (ref 22–32)
Calcium: 9.2 mg/dL (ref 8.9–10.3)
Chloride: 103 mmol/L (ref 98–111)
Creatinine, Ser: 0.82 mg/dL (ref 0.44–1.00)
GFR, Estimated: >60 mL/min (ref >60)
Glucose, Bld: 90 mg/dL (ref 70–99)
Potassium: 4.3 mmol/L (ref 3.5–5.1)
Sodium: 138 mmol/L (ref 135–145)

## 2022-07-13 LAB — CBC
HCT: 39.8 % (ref 36.0–46.0)
Hemoglobin: 12.8 g/dL (ref 12.0–15.0)
MCH: 31.5 pg (ref 26.0–34.0)
MCHC: 32.2 g/dL (ref 30.0–36.0)
MCV: 98 fL (ref 80.0–100.0)
Platelets: 224 10*3/uL (ref 150–400)
RBC: 4.06 MIL/uL (ref 3.87–5.11)
RDW: 13.9 % (ref 11.5–15.5)
WBC: 6.6 10*3/uL (ref 4.0–10.5)
nRBC: 0 % (ref 0.0–0.2)

## 2022-07-13 NOTE — Progress Notes (Signed)
Primary Care Physician: Crist Infante, MD Referring Physician: Dr. Caryl Comes EP: Dr. Juanetta Snow Lorraine Little is a 81 y.o. female with a h/o aifb ablation 01/19/16 and  repeat ablation 02/16/17 that is in the afib clinic for f/u.She has done very well over the last year  and is pleased that she has not had any recurrent sustained arrhythmia.  Continues on xarelto 15 mg daily.   I am seeing pt 07/13/22, for issues of pt feeling presyncope. She noted this a couple of times in August and again 9/4. This latest episode she remembers standing, folding towels and vision dimmed and she felt lightheaded as she may pass out. She sat down quickly and it passed within a few minutes. She got up and checked her BP/HR and it was initially  122/58 with a HR of 113 bpm. She repeated a few minutes later with BP of 115/54 and HR of 107. No episodes since then. She does not feel she was in afib during any of the episodes. She continues on her anticoagulation xarelto 15 mg daily.    Today, she denies symptoms of palpitations, chest pain, shortness of breath, orthopnea, PND, lower extremity edema, dizziness, presyncope, syncope, or neurologic sequela. The patient is tolerating medications without difficulties and is otherwise without complaint today.   Past Medical History:  Diagnosis Date   Adrenal insufficiency (HCC)    takes prednisone '5mg'$  daily   Cervical disc disease    CVA (cerebral infarction)    a. 11/2011   Depression    Diverticulosis    GERD (gastroesophageal reflux disease)    pt denies symptoms with this   Glaucoma    Hyperlipidemia    a. Diagnosed 12/2011   Hypertension    Insomnia with sleep apnea    Internal hemorrhoid    Migraine headache    Osteoarthritis    Osteoporosis    Persistent atrial fibrillation ([place])    a.  Diagnosed 07/2011;  b. Normal nuclear myoview 07/2011 and normal EF at that time;  c. Most recent echo 2/20 /13 - EF 55-60% wit mild-mod MR.;  d. s/p DCCV 01/12/2012    Polymyalgia rheumatica ([place])    a. On chronic steroids   Tubular adenoma of colon 04/2007   Past Surgical History:  Procedure Laterality Date   ATRIAL FIBRILLATION ABLATION N/A 02/16/2017   Procedure: Atrial Fibrillation Ablation;  Surgeon: Thompson Grayer, MD;  Location: [place] [facility];  Service: Cardiovascular;  Laterality: N/A;   BREAST LUMPECTOMY  1980's   right  x2 (benign)   CARDIOVERSION  01/12/2012   Procedure: CARDIOVERSION;  Surgeon: Lelon Perla, MD;  Location: [place];  Service: Cardiovascular;  Laterality: N/A;   CARDIOVERSION  03/28/2012   Procedure: CARDIOVERSION;  Surgeon: Larey Dresser, MD;  Location: [place];  Service: Cardiovascular;  Laterality: N/A;   CARDIOVERSION  06/29/2012   Procedure: CARDIOVERSION;  Surgeon: Darlin Coco, MD;  Location: [facility] ENDOSCOPY;  Service: Cardiovascular;  Laterality: N/A;   CARDIOVERSION N/A 12/07/2015   Procedure: CARDIOVERSION;  Surgeon: Josue Hector, MD;  Location: [place];  Service: Cardiovascular;  Laterality: N/A;   ELECTROPHYSIOLOGIC STUDY N/A 01/19/2016   AFib ablation by Dr Rayann Heman   GLAUCOMA SURGERY     TEE WITHOUT CARDIOVERSION N/A 01/19/2016   Procedure: TRANSESOPHAGEAL ECHOCARDIOGRAM (TEE);  Surgeon: Thayer Headings, MD;  Location: [facility] ENDOSCOPY;  Service: Cardiovascular;  Laterality: N/A;   TONSILLECTOMY  age 31    Current Outpatient Medications  Medication Sig Dispense Refill   Ascorbic Acid (VITAMIN C) 1000 MG tablet Take 1,000 mg by mouth daily.     atorvastatin (LIPITOR) 80 MG tablet Take 1 tablet (80 mg total) by mouth daily. 90 tablet 1   Azelaic Acid 15 % cream Apply 1 application topically at bedtime. After skin is thoroughly washed and patted dry, gently but thoroughly massage a thin film of azelaic acid cream into the affected area twice daily, in the morning and evening.     Coenzyme Q10 (CO Q-10) 300 MG CAPS Take 300 mg by mouth at bedtime.     denosumab (PROLIA) 60 MG/ML SOSY injection Inject 60 mg into the  skin every 6 (six) months.     FIBER PO Take 2 tablets by mouth at bedtime. Chewable     Folic Acid-Vit T0-WIO X73 (HOMOCYSTEINE FORMULA PO) Take 2 capsules by mouth at bedtime.     gabapentin (NEURONTIN) 100 MG capsule TAKING 1 CAPSULE IN THE AM, SOMETIMES AN EXTRA TABLET IF NEEDED DURING THE DAY AND 2 CAPSULES AT NIGHT (Patient taking differently: 100 mg 3 (three) times daily. Extra if needed) 360 capsule 0   losartan (COZAAR) 100 MG tablet TAKE 1 TABLET BY MOUTH EVERY DAY 90 tablet 2   MAGNESIUM OXIDE PO Taking 2 capsules by mouth in the am, and 4 capsules in the pm- Mag7 prescription     metFORMIN (GLUCOPHAGE-XR) 500 MG 24 hr tablet Take 500 mg by mouth daily.     metoprolol succinate (TOPROL-XL) 25 MG 24 hr tablet Take 0.5 tablets (12.5 mg total) by mouth 2 (two) times daily. 90 tablet 2   Multiple Vitamin (MULTIVITAMIN ADULT PO) ARK- Taking 1 capsule by mouth in the morning - Prescription     Multiple Vitamin (MULTIVITAMIN ADULT PO) Dimpro- Taking 1 capsule by mouth in the morning- Prescription     polyethylene glycol powder (GLYCOLAX/MIRALAX) 17 GM/SCOOP powder Take by mouth as needed.     predniSONE (DELTASONE) 1 MG tablet Take 3 mg by mouth daily with breakfast.     PROGESTERONE PO Taking 1 capsule by mouth daily- '200mg'$ - Takes with Dinner     sodium chloride (OCEAN) 0.65 % SOLN nasal spray Place 1 spray into both nostrils as needed for congestion (As directed).      UNABLE TO FIND Med Name: Citracell. Take one tablespoon by mouth as needed     XARELTO 15 MG TABS tablet TAKE 1 TABLET (15 MG TOTAL) BY MOUTH DAILY WITH SUPPER 30 tablet 7   No current facility-administered medications for this encounter.    Allergies  Allergen Reactions   Ace Inhibitors     Other reaction(s): tickle cough in throat.   Estrogens     Other reaction(s): Unknown   Flecainide     Other reaction(s): blacked out and hurt her shoulder 1/13.   Pantoprazole Sodium     Other reaction(s): Unknown    Social  History   Socioeconomic History   Marital status: Married    Spouse name: Dwight   Number of children: 3   Years of education: college   Highest education level: Not on file  Occupational History   Occupation: retired     Comment: Pharmacist, hospital , [place]  Tobacco Use   Smoking status: Never   Smokeless tobacco: Never  Vaping Use   Vaping Use: Never used  Substance and Sexual Activity   Alcohol use: Yes    Comment: rare   Drug use: No   Sexual activity:  Not on file  Other Topics Concern   Not on file  Social History Narrative   Pt lives in [place] with spouse.  [name])  She was previously a Education officer, museum.    Caffeine- None   Right handed   Patient has three children.   Patient has a college education.   Social Determinants of Health   Financial Resource Strain: Not on file  Food Insecurity: Not on file  Transportation Needs: Not on file  Physical Activity: Not on file  Stress: Not on file  Social Connections: Not on file  Intimate Partner Violence: Not on file    Family History  Problem Relation Age of Onset   Stroke Mother        Deceased @ 21   Heart failure Mother        died from   Liver cancer Father        Deceased @ 12   Colon cancer Paternal Grandmother    Sleep apnea Neg Hx     ROS- All systems are reviewed and negative except as per the HPI above  Physical Exam: Vitals:   07/13/22 0914  BP: 132/66  Pulse: 82  Weight: 60.1 kg  Height: '5\' 2"'$  (1.575 m)    GEN- The patient is well appearing, alert and oriented x 3 today.   Head- normocephalic, atraumatic Eyes-  Sclera clear, conjunctiva pink Ears- hearing intact Oropharynx- clear Neck- supple, no JVP Lymph- no cervical lymphadenopathy Lungs- Clear to ausculation bilaterally, normal work of breathing Heart- Regular rate and rhythm, no murmurs, rubs or gallops, PMI not laterally displaced GI- soft, NT, ND, + BS Extremities- no clubbing, cyanosis, or edema MS- no significant deformity or  atrophy Skin- no rash or lesion Psych- euthymic mood, full affect Neuro- strength and sensation are intact  EKG-  Vent. rate 82 BPM PR interval 218 ms QRS duration 68 ms QT/QTcB 352/411 ms P-R-T axes 74 72 58 Sinus rhythm with sinus arrhythmia with 1st degree A-V block Otherwise normal ECG When compared with ECG of 25-Nov-2021 13:48, PREVIOUS ECG IS PRESENT   Assessment and Plan: 1. Paroxysmal afib Low afib burden since  last ablation 2018 Having 3-4 short presyncopal episodes since August 2 week zio patch Update echo  Continue  metoprolol xl  12.5 mg bid  Continue  xarelto to 15 mg  with chadsvasc score of at least 4 Labs recently checked by a holistic MD and WNL   2.  Hypertension  Stable   F/u afib in 6 weeks   Geroge Baseman. Janique Hoefer, [facility] [address] [place], [place] [address] [phone]

## 2022-07-30 DIAGNOSIS — R42 Dizziness and giddiness: Secondary | ICD-10-CM | POA: Diagnosis not present

## 2022-08-01 ENCOUNTER — Ambulatory Visit (HOSPITAL_COMMUNITY): Payer: PPO

## 2022-08-01 ENCOUNTER — Other Ambulatory Visit: Payer: Self-pay | Admitting: Adult Health

## 2022-08-01 NOTE — Addendum Note (Signed)
Encounter addended by: Juluis Mire, RN on: 08/01/2022 9:19 AM  Actions taken: Imaging Exam ended

## 2022-08-04 ENCOUNTER — Ambulatory Visit (HOSPITAL_COMMUNITY)
Admission: RE | Admit: 2022-08-04 | Discharge: 2022-08-04 | Disposition: A | Discharge status: Home / Self Care | Payer: PPO | Source: Ambulatory Visit | Attending: Nurse Practitioner | Admitting: Nurse Practitioner

## 2022-08-04 DIAGNOSIS — I10 Essential (primary) hypertension: Secondary | ICD-10-CM | POA: Diagnosis not present

## 2022-08-04 DIAGNOSIS — I081 Rheumatic disorders of both mitral and tricuspid valves: Secondary | ICD-10-CM | POA: Insufficient documentation

## 2022-08-04 DIAGNOSIS — Z8673 Personal history of transient ischemic attack (TIA), and cerebral infarction without residual deficits: Secondary | ICD-10-CM | POA: Diagnosis not present

## 2022-08-04 DIAGNOSIS — I480 Paroxysmal atrial fibrillation: Secondary | ICD-10-CM | POA: Insufficient documentation

## 2022-08-04 LAB — ECHOCARDIOGRAM COMPLETE
AR max vel: 1.49 cm2
AV Area VTI: 1.52 cm2
AV Area mean vel: 1.49 cm2
AV Mean grad: 3 mmHg
AV Peak grad: 4.4 mmHg
Ao pk vel: 1.05 m/s
Area-P 1/2: 4.1 cm2
MV VTI: 1.32 cm2
S' Lateral: 2.3 cm

## 2022-08-04 NOTE — Progress Notes (Signed)
  Echocardiogram 2D Echocardiogram has been performed.  Lorraine Little 08/04/2022, 8:59 AM

## 2022-08-09 ENCOUNTER — Encounter (HOSPITAL_COMMUNITY): Payer: Self-pay | Admitting: *Deleted

## 2022-08-15 ENCOUNTER — Ambulatory Visit (HOSPITAL_COMMUNITY)
Admission: RE | Admit: 2022-08-15 | Discharge: 2022-08-15 | Disposition: A | Discharge status: Home / Self Care | Payer: PPO | Source: Ambulatory Visit | Attending: Nurse Practitioner | Admitting: Nurse Practitioner

## 2022-08-15 ENCOUNTER — Encounter (HOSPITAL_COMMUNITY): Payer: Self-pay | Admitting: Nurse Practitioner

## 2022-08-15 VITALS — BP 136/74 | HR 92 | Ht 62.0 in | Wt 128.0 lb

## 2022-08-15 DIAGNOSIS — I480 Paroxysmal atrial fibrillation: Secondary | ICD-10-CM | POA: Insufficient documentation

## 2022-08-15 DIAGNOSIS — Z7901 Long term (current) use of anticoagulants: Secondary | ICD-10-CM | POA: Diagnosis not present

## 2022-08-15 DIAGNOSIS — D6869 Other thrombophilia: Secondary | ICD-10-CM

## 2022-08-15 DIAGNOSIS — R55 Syncope and collapse: Secondary | ICD-10-CM | POA: Diagnosis not present

## 2022-08-15 DIAGNOSIS — I10 Essential (primary) hypertension: Secondary | ICD-10-CM | POA: Insufficient documentation

## 2022-08-15 DIAGNOSIS — I4891 Unspecified atrial fibrillation: Secondary | ICD-10-CM | POA: Diagnosis not present

## 2022-08-15 NOTE — Progress Notes (Signed)
Primary Care Physician: Crist Infante, MD Referring Physician: Dr. Caryl Comes EP: Dr. Juanetta Snow Lorraine Little is a 81 y.o. female with a h/o aifb ablation 01/19/16 and  repeat ablation 02/16/17 that is in the afib clinic for f/u.She has done very well over the last year  and is pleased that she has not had any recurrent sustained arrhythmia.  Continues on xarelto 15 mg daily.   I am seeing pt 07/13/22, for issues of pt feeling presyncope. She noted this a couple of times in August and again 9/4. This latest episode she remembers standing, folding towels and vision dimmed and she felt lightheaded as she may pass out. She sat down quickly and it passed within a few minutes. She got up and checked her BP/HR and it was initially  122/58 with a HR of 113 bpm. She repeated a few minutes later with BP of 115/54 and HR of 107. No episodes since then. She does not feel she was in afib during any of the episodes. She continues on her anticoagulation xarelto 15 mg daily.  F/u in afib clinic, 08/15/22. She has not had any more feelings of being presyncopal. Zio patch and echo reviewed with pt. No afib seen on monitor, pauses of bradycardia. She did have a few short runs of SVT. Echo  with good EF and stable leaky valves.     Today, she denies symptoms of palpitations, chest pain, shortness of breath, orthopnea, PND, lower extremity edema, dizziness, presyncope, syncope, or neurologic sequela. The patient is tolerating medications without difficulties and is otherwise without complaint today.   Past Medical History:  Diagnosis Date   Adrenal insufficiency (HCC)    takes prednisone '5mg'$  daily   Cervical disc disease    CVA (cerebral infarction)    a. 11/2011   Depression    Diverticulosis    GERD (gastroesophageal reflux disease)    pt denies symptoms with this   Glaucoma    Hyperlipidemia    a. Diagnosed 12/2011   Hypertension    Insomnia with sleep apnea    Internal hemorrhoid    Migraine headache     Osteoarthritis    Osteoporosis    Persistent atrial fibrillation ([place])    a.  Diagnosed 07/2011;  b. Normal nuclear myoview 07/2011 and normal EF at that time;  c. Most recent echo 2/20 /13 - EF 55-60% wit mild-mod MR.;  d. s/p DCCV 01/12/2012   Polymyalgia rheumatica ([place])    a. On chronic steroids   Tubular adenoma of colon 04/2007   Past Surgical History:  Procedure Laterality Date   ATRIAL FIBRILLATION ABLATION N/A 02/16/2017   Procedure: Atrial Fibrillation Ablation;  Surgeon: Thompson Grayer, MD;  Location: [place] [facility];  Service: Cardiovascular;  Laterality: N/A;   BREAST LUMPECTOMY  1980's   right  x2 (benign)   CARDIOVERSION  01/12/2012   Procedure: CARDIOVERSION;  Surgeon: Lelon Perla, MD;  Location: [place];  Service: Cardiovascular;  Laterality: N/A;   CARDIOVERSION  03/28/2012   Procedure: CARDIOVERSION;  Surgeon: Larey Dresser, MD;  Location: [place];  Service: Cardiovascular;  Laterality: N/A;   CARDIOVERSION  06/29/2012   Procedure: CARDIOVERSION;  Surgeon: Darlin Coco, MD;  Location: [facility];  Service: Cardiovascular;  Laterality: N/A;   CARDIOVERSION N/A 12/07/2015   Procedure: CARDIOVERSION;  Surgeon: Josue Hector, MD;  Location: [place];  Service: Cardiovascular;  Laterality: N/A;   ELECTROPHYSIOLOGIC STUDY N/A 01/19/2016   AFib ablation  by Dr Rayann Heman   GLAUCOMA SURGERY     TEE WITHOUT CARDIOVERSION N/A 01/19/2016   Procedure: TRANSESOPHAGEAL ECHOCARDIOGRAM (TEE);  Surgeon: Thayer Headings, MD;  Location: [facility] ENDOSCOPY;  Service: Cardiovascular;  Laterality: N/A;   TONSILLECTOMY  age 38    Current Outpatient Medications  Medication Sig Dispense Refill   Ascorbic Acid (VITAMIN C) 1000 MG tablet Take 1,000 mg by mouth daily.     atorvastatin (LIPITOR) 80 MG tablet Take 1 tablet (80 mg total) by mouth daily. 90 tablet 1   Azelaic Acid 15 % cream Apply 1 application topically at bedtime. After skin is thoroughly washed and patted dry, gently but thoroughly  massage a thin film of azelaic acid cream into the affected area twice daily, in the morning and evening.     Coenzyme Q10 (CO Q-10) 300 MG CAPS Take 300 mg by mouth at bedtime.     denosumab (PROLIA) 60 MG/ML SOSY injection Inject 60 mg into the skin every 6 (six) months.     FIBER PO Take 2 tablets by mouth at bedtime. Chewable     Folic Acid-Vit [id number] T01 (HOMOCYSTEINE FORMULA PO) Take 2 capsules by mouth at bedtime.     gabapentin (NEURONTIN) 100 MG capsule TAKING 1 CAPSULE IN THE AM, SOMETIMES AN EXTRA TABLET IF NEEDED DURING THE DAY AND 2 CAPSULES AT NIGHT 360 capsule 0   losartan (COZAAR) 100 MG tablet TAKE 1 TABLET BY MOUTH EVERY DAY 90 tablet 2   MAGNESIUM OXIDE PO Taking 2 capsules by mouth in the am, and 4 capsules in the pm- Mag7 prescription     metFORMIN (GLUCOPHAGE-XR) 500 MG 24 hr tablet Take 500 mg by mouth daily.     metoprolol succinate (TOPROL-XL) 25 MG 24 hr tablet Take 0.5 tablets (12.5 mg total) by mouth 2 (two) times daily. 90 tablet 2   Multiple Vitamin (MULTIVITAMIN ADULT PO) ARK- Taking 1 capsule by mouth in the morning - Prescription     Multiple Vitamin (MULTIVITAMIN ADULT PO) Dimpro- Taking 1 capsule by mouth in the morning- Prescription     polyethylene glycol powder (GLYCOLAX/MIRALAX) 17 GM/SCOOP powder Take by mouth as needed.     predniSONE (DELTASONE) 1 MG tablet Take 3 mg by mouth daily with breakfast.     PROGESTERONE PO Taking 1 capsule by mouth daily- '200mg'$ - Takes with Dinner     sodium chloride (OCEAN) 0.65 % SOLN nasal spray Place 1 spray into both nostrils as needed for congestion (As directed).      UNABLE TO FIND Med Name: Citracell. Take one tablespoon by mouth as needed     XARELTO 15 MG TABS tablet TAKE 1 TABLET (15 MG TOTAL) BY MOUTH DAILY WITH SUPPER 30 tablet 7   No current facility-administered medications for this encounter.    Allergies  Allergen Reactions   Ace Inhibitors     Other reaction(s): tickle cough in throat.   Estrogens      Other reaction(s): Unknown   Flecainide     Other reaction(s): blacked out and hurt her shoulder 1/13.   Pantoprazole Sodium     Other reaction(s): Unknown    Social History   Socioeconomic History   Marital status: Married    Spouse name: [name]   Number of children: 3   Years of education: college   Highest education level: Not on file  Occupational History   Occupation: retired     Comment: Pharmacist, hospital , [place]  Tobacco Use   Smoking status:  Never   Smokeless tobacco: Never  Vaping Use   Vaping Use: Never used  Substance and Sexual Activity   Alcohol use: Yes    Comment: rare   Drug use: No   Sexual activity: Not on file  Other Topics Concern   Not on file  Social History Narrative   Pt lives in [place] with spouse.  [name])  She was previously a Education officer, museum.    Caffeine- None   Right handed   Patient has three children.   Patient has a college education.   Social Determinants of Health   Financial Resource Strain: Not on file  Food Insecurity: Not on file  Transportation Needs: Not on file  Physical Activity: Not on file  Stress: Not on file  Social Connections: Not on file  Intimate Partner Violence: Not on file    Family History  Problem Relation Age of Onset   Stroke Mother        Deceased @ 57   Heart failure Mother        died from   Liver cancer Father        Deceased @ 32   Colon cancer Paternal Grandmother    Sleep apnea Neg Hx     ROS- All systems are reviewed and negative except as per the HPI above  Physical Exam: Vitals:   08/15/22 1001  Height: '5\' 2"'$  (1.575 m)    GEN- The patient is well appearing, alert and oriented x 3 today.   Head- normocephalic, atraumatic Eyes-  Sclera clear, conjunctiva pink Ears- hearing intact Oropharynx- clear Neck- supple, no JVP Lymph- no cervical lymphadenopathy Lungs- Clear to ausculation bilaterally, normal work of breathing Heart- Regular rate and rhythm, no murmurs, rubs or gallops, PMI not  laterally displaced GI- soft, NT, ND, + BS Extremities- no clubbing, cyanosis, or edema MS- no significant deformity or atrophy Skin- no rash or lesion Psych- euthymic mood, full affect Neuro- strength and sensation are intact  EKG-  Vent. rate 92 BPM PR interval 206 ms QRS duration 72 ms QT/QTcB 354/437 ms P-R-T axes 75 71 18 Normal sinus rhythm Nonspecific T wave abnormality Abnormal ECG When compared with ECG of 13-Jul-2022 09:43, PREVIOUS ECG IS PRESENT  Zio patch-Patient had a min HR of 62 bpm, max HR of 182 bpm, and avg HR of 84 bpm. Predominant underlying rhythm was Sinus Rhythm. First Degree AV Block was present. 50 Supraventricular Tachycardia runs occurred, the run with the fastest interval lasting 10 beats with a max rate of 182 bpm, the longest lasting 21.3 secs with an avg rate of 128 bpm. Isolated SVEs were rare (<1.0%), SVE Couplets were rare (<1.0%), and SVE Triplets were rare (<1.0%). Isolated VEs were rare (<1.0%), VE Couplets were rare (<1.0%), and no VE Triplets were present  Echo- 1. Left ventricular ejection fraction, by estimation, is 60 to 65%. The  left ventricle has normal function. The left ventricle has no regional  wall motion abnormalities. Left ventricular diastolic parameters are  indeterminate.   2. Right ventricular systolic function is normal. The right ventricular  size is normal. There is mildly elevated pulmonary artery systolic  pressure. The estimated right ventricular systolic pressure is 61.9 mmHg.   3. Left atrial size was moderately dilated.   4. The mitral valve is normal in structure. Mild to moderate mitral valve  regurgitation. No evidence of mitral stenosis.   5. Tricuspid valve regurgitation is moderate.   6. The aortic valve is tricuspid. There is  mild calcification of the  aortic valve. There is mild thickening of the aortic valve. Aortic valve  regurgitation is not visualized. Aortic valve sclerosis is present, with  no  evidence of aortic valve stenosis.  Aortic valve mean gradient measures 3.0 mmHg. Aortic valve Vmax measures  1.05 m/s.   7. The inferior vena cava is normal in size with greater than 50%  respiratory variability, suggesting right atrial pressure of 3 mmHg.   Assessment and Plan: 1. Paroxysmal afib Low afib burden since  last ablation 2018 Having 3-4 short presyncopal episodes since August None since  Echo and monitor reviewed with pt  Continue  metoprolol xl  12.5 mg bid  Continue  xarelto 15 mg  with chadsvasc score of at least 4   2.  Hypertension  Stable    F/u afib in 6  months   Butch Penny C. Ayaz Sondgeroth, [facility] [address] [place], [place] [address] [phone]

## 2022-08-23 DIAGNOSIS — H43811 Vitreous degeneration, right eye: Secondary | ICD-10-CM | POA: Diagnosis not present

## 2022-09-07 DIAGNOSIS — E559 Vitamin D deficiency, unspecified: Secondary | ICD-10-CM | POA: Diagnosis not present

## 2022-09-07 DIAGNOSIS — E7211 Homocystinuria: Secondary | ICD-10-CM | POA: Diagnosis not present

## 2022-09-07 DIAGNOSIS — Z13220 Encounter for screening for lipoid disorders: Secondary | ICD-10-CM | POA: Diagnosis not present

## 2022-09-12 DIAGNOSIS — R7989 Other specified abnormal findings of blood chemistry: Secondary | ICD-10-CM | POA: Diagnosis not present

## 2022-09-12 DIAGNOSIS — I10 Essential (primary) hypertension: Secondary | ICD-10-CM | POA: Diagnosis not present

## 2022-09-12 DIAGNOSIS — M810 Age-related osteoporosis without current pathological fracture: Secondary | ICD-10-CM | POA: Diagnosis not present

## 2022-09-12 DIAGNOSIS — E785 Hyperlipidemia, unspecified: Secondary | ICD-10-CM | POA: Diagnosis not present

## 2022-09-12 DIAGNOSIS — R946 Abnormal results of thyroid function studies: Secondary | ICD-10-CM | POA: Diagnosis not present

## 2022-09-12 DIAGNOSIS — Z7952 Long term (current) use of systemic steroids: Secondary | ICD-10-CM | POA: Diagnosis not present

## 2022-09-12 DIAGNOSIS — D649 Anemia, unspecified: Secondary | ICD-10-CM | POA: Diagnosis not present

## 2022-09-19 DIAGNOSIS — Z1339 Encounter for screening examination for other mental health and behavioral disorders: Secondary | ICD-10-CM | POA: Diagnosis not present

## 2022-09-19 DIAGNOSIS — E785 Hyperlipidemia, unspecified: Secondary | ICD-10-CM | POA: Diagnosis not present

## 2022-09-19 DIAGNOSIS — R82998 Other abnormal findings in urine: Secondary | ICD-10-CM | POA: Diagnosis not present

## 2022-09-19 DIAGNOSIS — Z7901 Long term (current) use of anticoagulants: Secondary | ICD-10-CM | POA: Diagnosis not present

## 2022-09-19 DIAGNOSIS — Z1331 Encounter for screening for depression: Secondary | ICD-10-CM | POA: Diagnosis not present

## 2022-09-19 DIAGNOSIS — M353 Polymyalgia rheumatica: Secondary | ICD-10-CM | POA: Diagnosis not present

## 2022-09-19 DIAGNOSIS — Z23 Encounter for immunization: Secondary | ICD-10-CM | POA: Diagnosis not present

## 2022-09-19 DIAGNOSIS — D6869 Other thrombophilia: Secondary | ICD-10-CM | POA: Diagnosis not present

## 2022-09-19 DIAGNOSIS — Z0000 Encounter for general adult medical examination without abnormal findings: Secondary | ICD-10-CM | POA: Diagnosis not present

## 2022-09-19 DIAGNOSIS — I6340 Cerebral infarction due to embolism of unspecified cerebral artery: Secondary | ICD-10-CM | POA: Diagnosis not present

## 2022-09-19 DIAGNOSIS — I83893 Varicose veins of bilateral lower extremities with other complications: Secondary | ICD-10-CM | POA: Diagnosis not present

## 2022-09-19 DIAGNOSIS — I10 Essential (primary) hypertension: Secondary | ICD-10-CM | POA: Diagnosis not present

## 2022-09-19 DIAGNOSIS — M810 Age-related osteoporosis without current pathological fracture: Secondary | ICD-10-CM | POA: Diagnosis not present

## 2022-10-03 DIAGNOSIS — Z79890 Hormone replacement therapy (postmenopausal): Secondary | ICD-10-CM | POA: Diagnosis not present

## 2022-10-03 DIAGNOSIS — M818 Other osteoporosis without current pathological fracture: Secondary | ICD-10-CM | POA: Diagnosis not present

## 2022-10-03 DIAGNOSIS — N951 Menopausal and female climacteric states: Secondary | ICD-10-CM | POA: Diagnosis not present

## 2022-10-05 ENCOUNTER — Other Ambulatory Visit (HOSPITAL_COMMUNITY): Payer: Self-pay | Admitting: Nurse Practitioner

## 2022-10-17 ENCOUNTER — Ambulatory Visit: Payer: PPO | Admitting: Podiatry

## 2022-10-17 ENCOUNTER — Other Ambulatory Visit (HOSPITAL_COMMUNITY): Payer: Self-pay

## 2022-10-18 ENCOUNTER — Ambulatory Visit (HOSPITAL_COMMUNITY)
Admission: RE | Admit: 2022-10-18 | Discharge: 2022-10-18 | Disposition: A | Discharge status: Home / Self Care | Payer: PPO | Source: Ambulatory Visit | Attending: Internal Medicine | Admitting: Internal Medicine

## 2022-10-18 DIAGNOSIS — M810 Age-related osteoporosis without current pathological fracture: Secondary | ICD-10-CM | POA: Insufficient documentation

## 2022-10-18 MED ORDER — DENOSUMAB 60 MG/ML ~~LOC~~ SOSY
PREFILLED_SYRINGE | SUBCUTANEOUS | Status: AC
  Filled 2022-10-18: qty 1

## 2022-10-18 MED ORDER — DENOSUMAB 60 MG/ML ~~LOC~~ SOSY
60.0000 mg | PREFILLED_SYRINGE | Freq: Once | SUBCUTANEOUS | Status: AC
  Administered 2022-10-18: 60 mg via SUBCUTANEOUS

## 2022-11-15 DIAGNOSIS — H25011 Cortical age-related cataract, right eye: Secondary | ICD-10-CM | POA: Diagnosis not present

## 2022-11-15 DIAGNOSIS — Z961 Presence of intraocular lens: Secondary | ICD-10-CM | POA: Diagnosis not present

## 2022-11-15 DIAGNOSIS — H18413 Arcus senilis, bilateral: Secondary | ICD-10-CM | POA: Diagnosis not present

## 2022-11-15 DIAGNOSIS — H2511 Age-related nuclear cataract, right eye: Secondary | ICD-10-CM | POA: Diagnosis not present

## 2022-11-22 ENCOUNTER — Encounter (HOSPITAL_COMMUNITY): Payer: Self-pay | Admitting: Nurse Practitioner

## 2022-11-22 ENCOUNTER — Ambulatory Visit (HOSPITAL_COMMUNITY)
Admission: RE | Admit: 2022-11-22 | Discharge: 2022-11-22 | Disposition: A | Discharge status: Home / Self Care | Payer: PPO | Source: Ambulatory Visit | Attending: Nurse Practitioner | Admitting: Nurse Practitioner

## 2022-11-22 VITALS — BP 142/68 | HR 85 | Ht 62.0 in | Wt 128.0 lb

## 2022-11-22 DIAGNOSIS — D6869 Other thrombophilia: Secondary | ICD-10-CM | POA: Diagnosis not present

## 2022-11-22 DIAGNOSIS — Z7901 Long term (current) use of anticoagulants: Secondary | ICD-10-CM | POA: Diagnosis not present

## 2022-11-22 DIAGNOSIS — I4891 Unspecified atrial fibrillation: Secondary | ICD-10-CM

## 2022-11-22 DIAGNOSIS — I440 Atrioventricular block, first degree: Secondary | ICD-10-CM | POA: Diagnosis not present

## 2022-11-22 DIAGNOSIS — I480 Paroxysmal atrial fibrillation: Secondary | ICD-10-CM | POA: Insufficient documentation

## 2022-11-22 DIAGNOSIS — I10 Essential (primary) hypertension: Secondary | ICD-10-CM | POA: Insufficient documentation

## 2022-11-22 DIAGNOSIS — I081 Rheumatic disorders of both mitral and tricuspid valves: Secondary | ICD-10-CM | POA: Insufficient documentation

## 2022-11-22 DIAGNOSIS — Z79899 Other long term (current) drug therapy: Secondary | ICD-10-CM | POA: Insufficient documentation

## 2022-11-22 NOTE — Progress Notes (Signed)
Primary Care Physician: Crist Infante, MD Referring Physician: Dr. Caryl Comes EP: Dr. Juanetta Snow Lorraine Little is a 82 y.o. female with a h/o aifb ablation 01/19/16 and  repeat ablation 02/16/17 that is in the afib clinic for f/u.She has done very well over the last year  and is pleased that she has not had any recurrent sustained arrhythmia.  Continues on xarelto 15 mg daily.   I am seeing pt 07/13/22, for issues of pt feeling presyncope. She noted this a couple of times in August and again 9/4. This latest episode she remembers standing, folding towels and vision dimmed and she felt lightheaded as she may pass out. She sat down quickly and it passed within a few minutes. She got up and checked her BP/HR and it was initially  122/58 with a HR of 113 bpm. She repeated a few minutes later with BP of 115/54 and HR of 107. No episodes since then. She does not feel she was in afib during any of the episodes. She continues on her anticoagulation xarelto 15 mg daily.  F/u in afib clinic, 08/15/22. She has not had any more feelings of being presyncopal. Zio patch and echo reviewed with pt. No afib seen on monitor, pauses of bradycardia. She did have a few short runs of SVT. Echo  with good EF and stable leaky valves.   F/u in afib clinic, 11/22/22. She reports that she is doing well. No afib to report. She has done very well since ablation 02/16/17 staying in [facility].     Today, she denies symptoms of palpitations, chest pain, shortness of breath, orthopnea, PND, lower extremity edema, dizziness, presyncope, syncope, or neurologic sequela. The patient is tolerating medications without difficulties and is otherwise without complaint today.   Past Medical History:  Diagnosis Date   Adrenal insufficiency (HCC)    takes prednisone '5mg'$  daily   Cervical disc disease    CVA (cerebral infarction)    a. 11/2011   Depression    Diverticulosis    GERD (gastroesophageal reflux disease)    pt denies symptoms with  this   Glaucoma    Hyperlipidemia    a. Diagnosed 12/2011   Hypertension    Insomnia with sleep apnea    Internal hemorrhoid    Migraine headache    Osteoarthritis    Osteoporosis    Persistent atrial fibrillation ([place])    a.  Diagnosed 07/2011;  b. Normal nuclear myoview 07/2011 and normal EF at that time;  c. Most recent echo 2/20 /13 - EF 55-60% wit mild-mod MR.;  d. s/p DCCV 01/12/2012   Polymyalgia rheumatica ([place])    a. On chronic steroids   Tubular adenoma of colon 04/2007   Past Surgical History:  Procedure Laterality Date   ATRIAL FIBRILLATION ABLATION N/A 02/16/2017   Procedure: Atrial Fibrillation Ablation;  Surgeon: Thompson Grayer, MD;  Location: [place] [facility];  Service: Cardiovascular;  Laterality: N/A;   BREAST LUMPECTOMY  1980's   right  x2 (benign)   CARDIOVERSION  01/12/2012   Procedure: CARDIOVERSION;  Surgeon: Lelon Perla, MD;  Location: [place];  Service: Cardiovascular;  Laterality: N/A;   CARDIOVERSION  03/28/2012   Procedure: CARDIOVERSION;  Surgeon: Larey Dresser, MD;  Location: [place];  Service: Cardiovascular;  Laterality: N/A;   CARDIOVERSION  06/29/2012   Procedure: CARDIOVERSION;  Surgeon: Darlin Coco, MD;  [facility]: [facility] ENDOSCOPY;  Service: Cardiovascular;  Laterality: N/A;   CARDIOVERSION N/A 12/07/2015  Procedure: CARDIOVERSION;  Surgeon: Josue Hector, MD;  Location: [facility] ENDOSCOPY;  Service: Cardiovascular;  Laterality: N/A;   ELECTROPHYSIOLOGIC STUDY N/A 01/19/2016   AFib ablation by Dr Rayann Heman   GLAUCOMA SURGERY     TEE WITHOUT CARDIOVERSION N/A 01/19/2016   Procedure: TRANSESOPHAGEAL ECHOCARDIOGRAM (TEE);  Surgeon: Thayer Headings, MD;  Location: [facility] ENDOSCOPY;  Service: Cardiovascular;  Laterality: N/A;   TONSILLECTOMY  age 27    Current Outpatient Medications  Medication Sig Dispense Refill   Coenzyme Q10 (CO Q-10) 300 MG CAPS Take 300 mg by mouth at bedtime.     denosumab (PROLIA) 60 MG/ML SOSY injection Inject 60 mg into the skin every 6  (six) months.     FIBER PO Take 2 tablets by mouth at bedtime. Chewable     Folic Acid-Vit S0-YTK Z60 (HOMOCYSTEINE FORMULA PO) Take 2 capsules by mouth at bedtime.     gabapentin (NEURONTIN) 100 MG capsule TAKING 1 CAPSULE IN THE AM, SOMETIMES AN EXTRA TABLET IF NEEDED DURING THE DAY AND 2 CAPSULES AT NIGHT 360 capsule 0   losartan (COZAAR) 100 MG tablet TAKE 1 TABLET BY MOUTH EVERY DAY 90 tablet 2   metFORMIN (GLUCOPHAGE-XR) 500 MG 24 hr tablet Take 500 mg by mouth daily.     metoprolol succinate (TOPROL-XL) 25 MG 24 hr tablet TAKE 0.5 TABLETS BY MOUTH 2 TIMES DAILY. 90 tablet 1   Multiple Vitamin (MULTIVITAMIN ADULT PO) ARK- Taking 1 capsule by mouth in the morning - Prescription     Multiple Vitamin (MULTIVITAMIN ADULT PO) Dimpro- Taking 1 capsule by mouth in the morning- Prescription     Nutritional Supplements (HOMOCYSTEINE SUPPORT PO) Take 2 capsules by mouth 2 (two) times daily.     polyethylene glycol powder (GLYCOLAX/MIRALAX) 17 GM/SCOOP powder Take by mouth as needed.     predniSONE (DELTASONE) 1 MG tablet Take 3 mg by mouth daily with breakfast.     PROGESTERONE PO Taking 1 capsule by mouth daily- '200mg'$  DR- Dewaine Conger with Dinner     rosuvastatin (CRESTOR) 10 MG tablet Take 10 mg by mouth daily.     sodium chloride (OCEAN) 0.65 % SOLN nasal spray Place 1 spray into both nostrils as needed for congestion (As directed).      UNABLE TO FIND Med Name: Citracell. Take one tablespoon by mouth as needed     XARELTO 15 MG TABS tablet TAKE 1 TABLET (15 MG TOTAL) BY MOUTH DAILY WITH SUPPER 30 tablet 7   Zinc 30 MG CAPS Take 1 capsule by mouth at bedtime.     MAGNESIUM OXIDE PO Taking 2 capsules by mouth in the am, and 2 capsules in the pm- Mag7 prescription (Patient not taking: Reported on 11/22/2022)     No current facility-administered medications for this encounter.    Allergies  Allergen Reactions   Ace Inhibitors     Other reaction(s): tickle cough in throat.   Estrogens     Other  reaction(s): Unknown   Flecainide     Other reaction(s): blacked out and hurt her shoulder 1/13.   Pantoprazole Sodium     Other reaction(s): Unknown    Social History   Socioeconomic History   Marital status: Married    Spouse name: [name]   Number of children: 3   Years of education: college   Highest education level: Not on file  Occupational History   Occupation: retired     Comment: Pharmacist, hospital , [place]  Tobacco Use   Smoking status: Never  Smokeless tobacco: Never  Vaping Use   Vaping Use: Never used  Substance and Sexual Activity   Alcohol use: Yes    Comment: rare   Drug use: No   Sexual activity: Not on file  Other Topics Concern   Not on file  Social History Narrative   Pt lives in [place] with spouse.  [name])  She was previously a Education officer, museum.    Caffeine- None   Right handed   Patient has three children.   Patient has a college education.   Social Determinants of Health   Financial Resource Strain: Not on file  Food Insecurity: Not on file  Transportation Needs: Not on file  Physical Activity: Not on file  Stress: Not on file  Social Connections: Not on file  Intimate Partner Violence: Not on file    Family History  Problem Relation Age of Onset   Stroke Mother        Deceased @ 42   Heart failure Mother        died from   Liver cancer Father        Deceased @ 41   Colon cancer Paternal Grandmother    Sleep apnea Neg Hx     ROS- All systems are reviewed and negative except as per the HPI above  Physical Exam: Vitals:   11/22/22 1117  BP: (!) 142/68  Pulse: 85  Weight: 58.1 kg  Height: '5\' 2"'$  (1.575 m)    GEN- The patient is well appearing, alert and oriented x 3 today.   Head- normocephalic, atraumatic Eyes-  Sclera clear, conjunctiva pink Ears- hearing intact Oropharynx- clear Neck- supple, no JVP Lymph- no cervical lymphadenopathy Lungs- Clear to ausculation bilaterally, normal work of breathing Heart- Regular rate and  rhythm, no murmurs, rubs or gallops, PMI not laterally displaced GI- soft, NT, ND, + BS Extremities- no clubbing, cyanosis, or edema MS- no significant deformity or atrophy Skin- no rash or lesion Psych- euthymic mood, full affect Neuro- strength and sensation are intact  EKG- Vent. rate 85 BPM PR interval 200 ms QRS duration 72 ms QT/QTcB 370/440 ms P-R-T axes 77 71 52 Normal sinus rhythm Normal ECG When compared with ECG of 15-Aug-2022 10:46, PREVIOUS ECG IS PRESENT  Zio patch-Patient had a min HR of 62 bpm, max HR of 182 bpm, and avg HR of 84 bpm. Predominant underlying rhythm was Sinus Rhythm. First Degree AV Block was present. 50 Supraventricular Tachycardia runs occurred, the run with the fastest interval lasting 10 beats with a max rate of 182 bpm, the longest lasting 21.3 secs with an avg rate of 128 bpm. Isolated SVEs were rare (<1.0%), SVE Couplets were rare (<1.0%), and SVE Triplets were rare (<1.0%). Isolated VEs were rare (<1.0%), VE Couplets were rare (<1.0%), and no VE Triplets were present  Echo-  08/04/22- 1. Left ventricular ejection fraction, by estimation, is 60 to 65%. The  left ventricle has normal function. The left ventricle has no regional  wall motion abnormalities. Left ventricular diastolic parameters are  indeterminate.   2. Right ventricular systolic function is normal. The right ventricular  size is normal. There is mildly elevated pulmonary artery systolic  pressure. The estimated right ventricular systolic pressure is 08.1 mmHg.   3. Left atrial size was moderately dilated.   4. The mitral valve is normal in structure. Mild to moderate mitral valve  regurgitation. No evidence of mitral stenosis.   5. Tricuspid valve regurgitation is moderate.   6. The aortic  valve is tricuspid. There is mild calcification of the  aortic valve. There is mild thickening of the aortic valve. Aortic valve  regurgitation is not visualized. Aortic valve sclerosis is  present, with  no evidence of aortic valve stenosis.  Aortic valve mean gradient measures 3.0 mmHg. Aortic valve Vmax measures  1.05 m/s.   7. The inferior vena cava is normal in size with greater than 50%  respiratory variability, suggesting right atrial pressure of 3 mmHg.   Assessment and Plan: 1. Paroxysmal afib Low afib burden since  last ablation 2018 Continue  metoprolol xl  12.5 mg bid  Continue  xarelto 15 mg  with chadsvasc score of at least 4   2.  Hypertension  Stable    F/u afib clinic  as needed I will request f/u with a general cardiologist at pt request   Butch Penny C. Mccade Sullenberger, [facility] [address] [place], [place] [address] [phone]

## 2022-12-08 ENCOUNTER — Encounter (HOSPITAL_COMMUNITY): Payer: Self-pay | Admitting: *Deleted

## 2022-12-14 DIAGNOSIS — H2511 Age-related nuclear cataract, right eye: Secondary | ICD-10-CM | POA: Diagnosis not present

## 2022-12-27 ENCOUNTER — Other Ambulatory Visit (HOSPITAL_COMMUNITY): Payer: Self-pay | Admitting: Nurse Practitioner

## 2023-01-11 DIAGNOSIS — R82998 Other abnormal findings in urine: Secondary | ICD-10-CM | POA: Diagnosis not present

## 2023-01-12 DIAGNOSIS — E785 Hyperlipidemia, unspecified: Secondary | ICD-10-CM | POA: Diagnosis not present

## 2023-01-26 ENCOUNTER — Telehealth (HOSPITAL_COMMUNITY): Payer: Self-pay | Admitting: *Deleted

## 2023-01-26 MED ORDER — DILTIAZEM HCL 30 MG PO TABS: ORAL_TABLET | ORAL | 1 refills | Status: DC

## 2023-01-26 NOTE — Telephone Encounter (Signed)
Patient called in stating around 11am she converted into afib HRs in the 180s. She took an extra 1/2 of metoprolol and has repeated that again before calling. Pt was having cold sweats this has stopped. Currently BP 111/84 HR 178 on pulse ox HR 130-150 - per Adline Peals will call in PRN cardizem 30mg  to use for rates over 100. If HR does not respond to PRN cardizem or symptoms develop pt should proceed to ER for urgent DCCV. Pt and son verbalized agreement. Rx sent.

## 2023-01-27 NOTE — Telephone Encounter (Signed)
Pt HRs 108-112 now after using PRN cardizem. If still in AF Monday will bring in for assessment. ER precautions reviewed with patient.

## 2023-01-31 ENCOUNTER — Ambulatory Visit (HOSPITAL_COMMUNITY): Payer: PPO | Admitting: Internal Medicine

## 2023-02-17 ENCOUNTER — Other Ambulatory Visit (HOSPITAL_COMMUNITY): Payer: Self-pay | Admitting: Physician Assistant

## 2023-02-27 DIAGNOSIS — T148XXA Other injury of unspecified body region, initial encounter: Secondary | ICD-10-CM | POA: Diagnosis not present

## 2023-02-27 DIAGNOSIS — S81801A Unspecified open wound, right lower leg, initial encounter: Secondary | ICD-10-CM | POA: Diagnosis not present

## 2023-03-25 ENCOUNTER — Other Ambulatory Visit: Payer: Self-pay | Admitting: Adult Health

## 2023-04-11 DIAGNOSIS — G4733 Obstructive sleep apnea (adult) (pediatric): Secondary | ICD-10-CM | POA: Diagnosis not present

## 2023-04-11 DIAGNOSIS — M810 Age-related osteoporosis without current pathological fracture: Secondary | ICD-10-CM | POA: Diagnosis not present

## 2023-04-11 DIAGNOSIS — Z79899 Other long term (current) drug therapy: Secondary | ICD-10-CM | POA: Diagnosis not present

## 2023-04-11 DIAGNOSIS — I6340 Cerebral infarction due to embolism of unspecified cerebral artery: Secondary | ICD-10-CM | POA: Diagnosis not present

## 2023-04-11 DIAGNOSIS — Z7901 Long term (current) use of anticoagulants: Secondary | ICD-10-CM | POA: Diagnosis not present

## 2023-04-11 DIAGNOSIS — I10 Essential (primary) hypertension: Secondary | ICD-10-CM | POA: Diagnosis not present

## 2023-04-11 DIAGNOSIS — D649 Anemia, unspecified: Secondary | ICD-10-CM | POA: Diagnosis not present

## 2023-04-11 DIAGNOSIS — M353 Polymyalgia rheumatica: Secondary | ICD-10-CM | POA: Diagnosis not present

## 2023-04-11 DIAGNOSIS — G43909 Migraine, unspecified, not intractable, without status migrainosus: Secondary | ICD-10-CM | POA: Diagnosis not present

## 2023-04-11 DIAGNOSIS — E785 Hyperlipidemia, unspecified: Secondary | ICD-10-CM | POA: Diagnosis not present

## 2023-04-11 DIAGNOSIS — D6869 Other thrombophilia: Secondary | ICD-10-CM | POA: Diagnosis not present

## 2023-04-11 DIAGNOSIS — I480 Paroxysmal atrial fibrillation: Secondary | ICD-10-CM | POA: Diagnosis not present

## 2023-04-18 ENCOUNTER — Other Ambulatory Visit (HOSPITAL_COMMUNITY): Payer: Self-pay | Admitting: *Deleted

## 2023-04-19 ENCOUNTER — Encounter (HOSPITAL_COMMUNITY): Payer: Self-pay | Admitting: Physician Assistant

## 2023-04-19 ENCOUNTER — Ambulatory Visit (HOSPITAL_COMMUNITY)
Admission: RE | Admit: 2023-04-19 | Discharge: 2023-04-19 | Disposition: A | Discharge status: Home / Self Care | Payer: PPO | Source: Ambulatory Visit | Attending: Physician Assistant | Admitting: Physician Assistant

## 2023-04-19 VITALS — BP 126/76 | HR 87 | Ht 62.0 in | Wt 126.2 lb

## 2023-04-19 DIAGNOSIS — G4733 Obstructive sleep apnea (adult) (pediatric): Secondary | ICD-10-CM | POA: Diagnosis not present

## 2023-04-19 DIAGNOSIS — I10 Essential (primary) hypertension: Secondary | ICD-10-CM | POA: Insufficient documentation

## 2023-04-19 DIAGNOSIS — Z7901 Long term (current) use of anticoagulants: Secondary | ICD-10-CM | POA: Insufficient documentation

## 2023-04-19 DIAGNOSIS — I480 Paroxysmal atrial fibrillation: Secondary | ICD-10-CM | POA: Diagnosis not present

## 2023-04-19 DIAGNOSIS — D6869 Other thrombophilia: Secondary | ICD-10-CM | POA: Insufficient documentation

## 2023-04-19 DIAGNOSIS — Z8673 Personal history of transient ischemic attack (TIA), and cerebral infarction without residual deficits: Secondary | ICD-10-CM | POA: Diagnosis not present

## 2023-04-19 DIAGNOSIS — Z79899 Other long term (current) drug therapy: Secondary | ICD-10-CM | POA: Insufficient documentation

## 2023-04-19 MED ORDER — METOPROLOL SUCCINATE ER 25 MG PO TB24: 25.0000 mg | ORAL_TABLET | Freq: Two times a day (BID) | ORAL | 1 refills | Status: DC

## 2023-04-19 NOTE — Patient Instructions (Signed)
Increase Metoprolol dose- Take 25 mg by mouth twice daily

## 2023-04-19 NOTE — Progress Notes (Signed)
Primary Care Physician: Rodrigo Ran, MD Primary Cardiologist: Donato Schultz, MD (new) Electrophysiologist: Hillis Range, MD (Inactive)  Referring Physician: Dr Magnus Sinning is a 82 y.o. female with a history of CVA, HLD, HTN, OSA, polymyalgia rheumatica, atrial fibrillation who presents for follow up in the [facility]. Patient is s/p two ablations 01/19/16 and 02/16/17. Patient is on Xarelto for a CHADS2VASC score of 6.  On follow up today, patient reports that over the past 6 months she has had an increasing afib burden. She has identified stress as a trigger for her, no other specific triggers that she could identify. She has tachypalpitations while in afib. Her Lourena Simmonds mobile shows almost daily episodes of afib. Most episodes are rate controlled but some are very rapid.   Today, she denies symptoms of chest pain, shortness of breath, orthopnea, PND, lower extremity edema, dizziness, presyncope, syncope, snoring, daytime somnolence, bleeding, or neurologic sequela. The patient is tolerating medications without difficulties and is otherwise without complaint today.   ROS- All systems are reviewed and negative except as per the HPI above.   Atrial Fibrillation Risk Factors:  she does have symptoms or diagnosis of sleep apnea. she is compliant with CPAP therapy. she does not have a history of rheumatic fever.   she has a BMI of Body mass index is 23.08 kg/m.Marland Kitchen Filed Weights   04/19/23 0932  Weight: 57.2 kg    Atrial Fibrillation Management history:  Previous antiarrhythmic drugs: amiodarone (hair loss), flecainide (dizziness, syncope) Previous cardioversions: 2013 x3, 2017 Previous ablations: 01/19/16, 02/16/17 Anticoagulation history: Xarelto    Physical Exam: BP 126/76   Pulse 87   Ht 5\' 2"  (1.575 m)   Wt 57.2 kg   BMI 23.08 kg/m   GEN: Well nourished, well developed in no acute distress NECK: No JVD; No carotid bruits CARDIAC:  Regular rate and rhythm, no murmurs, rubs, gallops RESPIRATORY:  Clear to auscultation without rales, wheezing or rhonchi  ABDOMEN: Soft, non-tender, non-distended EXTREMITIES:  No edema; No deformity   Wt Readings from Last 3 Encounters:  04/19/23 57.2 kg  11/22/22 58.1 kg  08/15/22 58.1 kg     EKG today demonstrates  SR, 1st degree AV block Vent. rate 87 BPM PR interval 210 ms QRS duration 66 ms QT/QTcB 372/447 ms  Echo 08/04/22 demonstrated   1. Left ventricular ejection fraction, by estimation, is 60 to 65%. The  left ventricle has normal function. The left ventricle has no regional  wall motion abnormalities. Left ventricular diastolic parameters are  indeterminate.   2. Right ventricular systolic function is normal. The right ventricular  size is normal. There is mildly elevated pulmonary artery systolic  pressure. The estimated right ventricular systolic pressure is 44.0 mmHg.   3. Left atrial size was moderately dilated.   4. The mitral valve is normal in structure. Mild to moderate mitral valve  regurgitation. No evidence of mitral stenosis.   5. Tricuspid valve regurgitation is moderate.   6. The aortic valve is tricuspid. There is mild calcification of the  aortic valve. There is mild thickening of the aortic valve. Aortic valve  regurgitation is not visualized. Aortic valve sclerosis is present, with  no evidence of aortic valve stenosis.  Aortic valve mean gradient measures 3.0 mmHg. Aortic valve Vmax measures  1.05 m/s.   7. The inferior vena cava is normal in size with greater than 50%  respiratory variability, suggesting right atrial pressure of 3 mmHg.  CHA2DS2-VASc Score = 7  The patient's score is based upon: CHF History: 0 HTN History: 1 Diabetes History: 0 Stroke History: 2 Vascular Disease History: 1 (aortic atherosclerosis) Age Score: 2 Gender Score: 1       ASSESSMENT AND PLAN: Paroxysmal Atrial Fibrillation (ICD10:  I48.0) The patient's  CHA2DS2-VASc score is 7, indicating a 11.2% annual risk of stroke.   Patient having frequent symptomatic episodes of afib (Kardia strips personally reviewed) We discussed rhythm control options today including AAD (Multaq, dofetilide, amiodarone) and repeat ablation. She does not want any further invasive procedures at this time. She would like to try increasing her BB first before starting AAD.  Increase Toprol to 25 mg BID Continue Xarelto 15 mg daily Continue diltiazem 30 mg PRN q 4 hours for heart racing.  Kardia mobile for home monitoring  Secondary Hypercoagulable State (ICD10:  [phone]) The patient is at significant risk for stroke/thromboembolism based upon her CHA2DS2-VASc Score of 7.  Continue Rivaroxaban (Xarelto).   OSA  Encouraged nightly CPAP Followed by [facility].  HTN Stable on current regimen   Follow up in the AF clinic in one month.    Jorja Loa PA-C Afib Clinic [facility] [address] [place], [place] [address] [phone]

## 2023-04-20 ENCOUNTER — Ambulatory Visit (HOSPITAL_COMMUNITY)
Admission: RE | Admit: 2023-04-20 | Discharge: 2023-04-20 | Disposition: A | Discharge status: Home / Self Care | Payer: PPO | Source: Ambulatory Visit | Attending: Internal Medicine | Admitting: Internal Medicine

## 2023-04-20 DIAGNOSIS — M810 Age-related osteoporosis without current pathological fracture: Secondary | ICD-10-CM | POA: Insufficient documentation

## 2023-04-20 MED ORDER — DENOSUMAB 60 MG/ML ~~LOC~~ SOSY
PREFILLED_SYRINGE | SUBCUTANEOUS | Status: AC
  Filled 2023-04-20: qty 1

## 2023-04-20 MED ORDER — DENOSUMAB 60 MG/ML ~~LOC~~ SOSY
60.0000 mg | PREFILLED_SYRINGE | Freq: Once | SUBCUTANEOUS | Status: AC
  Administered 2023-04-20: 60 mg via SUBCUTANEOUS

## 2023-05-22 ENCOUNTER — Ambulatory Visit (HOSPITAL_COMMUNITY)
Admission: RE | Admit: 2023-05-22 | Discharge: 2023-05-22 | Disposition: A | Discharge status: Home / Self Care | Payer: PPO | Source: Ambulatory Visit | Attending: Physician Assistant | Admitting: Physician Assistant

## 2023-05-22 ENCOUNTER — Encounter (HOSPITAL_COMMUNITY): Payer: Self-pay | Admitting: Physician Assistant

## 2023-05-22 VITALS — BP 130/72 | HR 91 | Ht 62.0 in | Wt 123.8 lb

## 2023-05-22 DIAGNOSIS — Z7901 Long term (current) use of anticoagulants: Secondary | ICD-10-CM | POA: Diagnosis not present

## 2023-05-22 DIAGNOSIS — I480 Paroxysmal atrial fibrillation: Secondary | ICD-10-CM | POA: Diagnosis not present

## 2023-05-22 DIAGNOSIS — G4733 Obstructive sleep apnea (adult) (pediatric): Secondary | ICD-10-CM | POA: Diagnosis not present

## 2023-05-22 DIAGNOSIS — D6869 Other thrombophilia: Secondary | ICD-10-CM

## 2023-05-22 DIAGNOSIS — I10 Essential (primary) hypertension: Secondary | ICD-10-CM | POA: Diagnosis not present

## 2023-05-22 MED ORDER — MULTAQ 400 MG PO TABS: 400.0000 mg | ORAL_TABLET | Freq: Two times a day (BID) | ORAL | 3 refills | Status: DC

## 2023-05-22 NOTE — Patient Instructions (Addendum)
Start Multaq 400mg  twice a day with food

## 2023-05-22 NOTE — Progress Notes (Signed)
Primary Care Physician: Rodrigo Ran, MD Primary Cardiologist: Donato Schultz, MD (new) Electrophysiologist: Hillis Range, MD (Inactive)  Referring Physician: Dr Magnus Sinning is a 82 y.o. female with a history of CVA, HLD, HTN, OSA, polymyalgia rheumatica, atrial fibrillation who presents for follow up in the [facility]. Patient is s/p two ablations 01/19/16 and 02/16/17. Patient is on Xarelto for a CHADS2VASC score of 6.  On follow up today, patient reports that she has continued to have frequent symptoms of afib, feeling like her heart is "up in my throat". She has a Sales executive which shows both SR with PACs and afib. Her heart rates while in afib are better controlled on the higher dose of BB.   Today, she denies symptoms of chest pain, shortness of breath, orthopnea, PND, lower extremity edema, dizziness, presyncope, syncope, snoring, daytime somnolence, bleeding, or neurologic sequela. The patient is tolerating medications without difficulties and is otherwise without complaint today.   ROS- All systems are reviewed and negative except as per the HPI above.   Atrial Fibrillation Risk Factors:  she does have symptoms or diagnosis of sleep apnea. she is compliant with CPAP therapy. she does not have a history of rheumatic fever.   Atrial Fibrillation Management history:  Previous antiarrhythmic drugs: amiodarone (hair loss), flecainide (dizziness, syncope) Previous cardioversions: 2013 x3, 2017 Previous ablations: 01/19/16, 02/16/17 Anticoagulation history: Xarelto    Physical Exam: BP 130/72   Pulse 91   Ht 5\' 2"  (1.575 m)   Wt 56.2 kg   BMI 22.64 kg/m   GEN: Well nourished, well developed in no acute distress NECK: No JVD; No carotid bruits CARDIAC: Regular rate and rhythm with occasional ectopy, no murmurs, rubs, gallops RESPIRATORY:  Clear to auscultation without rales, wheezing or rhonchi  ABDOMEN: Soft, non-tender,  non-distended EXTREMITIES:  No edema; No deformity    Wt Readings from Last 3 Encounters:  05/22/23 56.2 kg  04/19/23 57.2 kg  11/22/22 58.1 kg     EKG today demonstrates  SR, 1st degree AV block, PACs Vent. rate 91 BPM PR interval 224 ms QRS duration 66 ms QT/QTcB 358/440 ms  Echo 08/04/22 demonstrated   1. Left ventricular ejection fraction, by estimation, is 60 to 65%. The  left ventricle has normal function. The left ventricle has no regional  wall motion abnormalities. Left ventricular diastolic parameters are  indeterminate.   2. Right ventricular systolic function is normal. The right ventricular  size is normal. There is mildly elevated pulmonary artery systolic  pressure. The estimated right ventricular systolic pressure is 44.0 mmHg.   3. Left atrial size was moderately dilated.   4. The mitral valve is normal in structure. Mild to moderate mitral valve  regurgitation. No evidence of mitral stenosis.   5. Tricuspid valve regurgitation is moderate.   6. The aortic valve is tricuspid. There is mild calcification of the  aortic valve. There is mild thickening of the aortic valve. Aortic valve  regurgitation is not visualized. Aortic valve sclerosis is present, with  no evidence of aortic valve stenosis.  Aortic valve mean gradient measures 3.0 mmHg. Aortic valve Vmax measures  1.05 m/s.   7. The inferior vena cava is normal in size with greater than 50%  respiratory variability, suggesting right atrial pressure of 3 mmHg.    CHA2DS2-VASc Score = 7  The patient's score is based upon: CHF History: 0 HTN History: 1 Diabetes History: 0 Stroke History: 2 Vascular Disease  History: 1 (aortic atherosclerosis) Age Score: 2 Gender Score: 1       ASSESSMENT AND PLAN: Paroxysmal Atrial Fibrillation (ICD10:  I48.0) The patient's CHA2DS2-VASc score is 7, indicating a 11.2% annual risk of stroke.   S/p afib ablation 2017 and 2018. She does not want any further invasive  procedures at this time. We discussed rhythm control options again today including AAD Multaq, dofetilide, amiodarone. She does not want to retry amiodarone due to off target effects and would prefer to avoid a hospital stay. Will start Multaq 400 mg BID. Recent Cmet in care everywhere reviewed. Check ECG next week.  Continue Toprol 25 mg BID Continue Xarelto 15 mg daily Continue diltiazem 30 mg PRN q 4 hours for heart racing.  Kardia mobile for home monitoring  Secondary Hypercoagulable State (ICD10:  [id number]) The patient is at significant risk for stroke/thromboembolism based upon her CHA2DS2-VASc Score of 7.  Continue Rivaroxaban (Xarelto).   OSA  Encouraged nightly CPAP Followed by [facility]  HTN Stable on current regimen   Follow up in the AF clinic next week for ECG after Multaq start.    Jorja Loa PA-C Afib Clinic [facility] [address] [place], [place] [address] [phone]

## 2023-05-29 ENCOUNTER — Ambulatory Visit (HOSPITAL_COMMUNITY)
Admission: RE | Admit: 2023-05-29 | Discharge: 2023-05-29 | Disposition: A | Discharge status: Home / Self Care | Payer: PPO | Source: Ambulatory Visit | Attending: Physician Assistant | Admitting: Physician Assistant

## 2023-05-29 DIAGNOSIS — I440 Atrioventricular block, first degree: Secondary | ICD-10-CM | POA: Diagnosis not present

## 2023-05-29 DIAGNOSIS — Z79899 Other long term (current) drug therapy: Secondary | ICD-10-CM | POA: Diagnosis not present

## 2023-05-29 DIAGNOSIS — I480 Paroxysmal atrial fibrillation: Secondary | ICD-10-CM | POA: Diagnosis not present

## 2023-05-29 NOTE — Progress Notes (Signed)
Patient returns for ECG after starting Multaq. ECG shows:  SR, 1st degree AV block Vent. rate 74 BPM PR interval 226 ms QRS duration 70 ms QT/QTcB 394/437 ms  Patient also brings her Kardia mobile strips in for review which show SR with PACs, no afib. Follow up in the AF clinic in 2 months.

## 2023-06-15 ENCOUNTER — Other Ambulatory Visit: Payer: Self-pay | Admitting: Adult Health

## 2023-06-18 ENCOUNTER — Other Ambulatory Visit (HOSPITAL_COMMUNITY): Payer: Self-pay | Admitting: Physician Assistant

## 2023-06-22 DIAGNOSIS — Z1231 Encounter for screening mammogram for malignant neoplasm of breast: Secondary | ICD-10-CM | POA: Diagnosis not present

## 2023-07-04 ENCOUNTER — Encounter: Payer: Self-pay | Admitting: *Deleted

## 2023-07-04 NOTE — Progress Notes (Signed)
PATIENT: Lorraine Little DOB: 1941-06-07  REASON FOR VISIT: follow up HISTORY FROM: patient  Chief Complaint  Patient presents with   Obstructive Sleep Apnea    Rm 18 alone Pt is well and stable, reports no new concerns with OSA/CPAP. Has been using machine nightly.      HISTORY OF PRESENT ILLNESS: Today 07/04/23:  Lorraine Little is a 82 y.o. female with a history of OSA on CPAP and Headache. Returns today for follow-up.  Overall she is doing doing well.  Denies any new symptoms.  In regards to her headaches she continues on gabapentin 100 mg TID. Feels that her headaches are under relatively good control.  Has not really had a migraine since she has been on gabapentin. she returns today for an evaluation.  Patient has an older machine.  We were unable to get a download today.  She states that she is using it nightly.   06/23/2022: Lorraine Little is an 82 year old female with a history of obstructive sleep apnea on CPAP and headaches.  She returns today for follow-up.  She did not bring her download with her.  She reports that CPAP is working well.  She will try to drop off her SD card by her office so we can look at a download.  In regards to her headaches she states they are pretty good. Takes gabapentin 300 mg TID. Seeing a holistic MD in [place]. Reports in August she had two episodes started while she  was sitting, started feeling weird and had trouble walking. Felt like she was walking on clouds. Only last 2-3 minutes.  Patient does not wish to have any further work-up as of now.  She plans to monitor symptoms.  06/21/21: Lorraine Little is an 82 year old female with a history of obstructive sleep apnea on CPAP and headaches.  She returns today for follow-up.  She reports that the CPAP is working well for her.  She denies any new symptoms.  Her download is below.  Patient reports that her headaches seem to be controlled with gabapentin.  She has approximately 1 headache a week.   She typically can take an extra dose of gabapentin and the headache resolves fairly quickly.  She still occasionally will get the sharp shooting pains on the left side originating from the neck up to the temporal region.  At the last visit her sedimentation rate and CRP was in normal range.  The patient patient also notices at times she feels a little off balance.  Denies any falls.  She returns today for an evaluation.    07/13/20: Lorraine Little is an 82 year old female with a history of obstructive sleep apnea on CPAP.  Her download indicates that she use her machine nightly for compliance of 100%.  She use her machine greater than 4 hours each night.  On average she uses her machine 7 hours and 47 minutes.  Her residual AHI is 0.4 on 5 to 10 cm of water with EPR of 1.  Leak in the 95th percentile is 38.9 L/min.    She reports that she continues to take gabapentin for headaches.  She states that she most recently had sharp shooting pains in the left temporal region.  This is not typical for her headaches.  She reports that it was very brief but severe.  Denies any visual changes.  Reports that this has happened in the past but does not happen very often.  She does have a history of  PMR and is on prednisone daily.  Reports that this is not currently being managed by a rheumatologist but rather her PCP.  HISTORY 02/24/20:   Lorraine Little is an 82 year old female with a history of obstructive sleep apnea on CPAP.  Her download indicates that she use her machine nightly for compliance of 100%.  She used her machine greater than 4 hours 29 out of 30 days for compliance of 97%.  She used her machine on average 7 hours and 50 minutes.  Her residual AHI is 0.5 on 5 to 10 cm of water with EPR of 1.  Leak in the 95th percentile is 34.8 L/min.   She continues to take gabapentin for headaches.  She typically takes 300 mg daily on occasion she may take 400 if she has a headache.  She reports that she has been having sharp  shooting pains behind the left ear.  These only last for seconds.  Reports that they do not occur consistently.    REVIEW OF SYSTEMS: Out of a complete 14 system review of symptoms, the patient complains only of the following symptoms, and all other reviewed systems are negative.  FSS 40 ESS 14  ALLERGIES: Allergies  Allergen Reactions   Ace Inhibitors     Other reaction(s): tickle cough in throat.   Estrogens     Other reaction(s): Unknown   Flecainide     Other reaction(s): blacked out and hurt her shoulder 1/13.   Pantoprazole Sodium     Other reaction(s): Unknown    HOME MEDICATIONS: Outpatient Medications Prior to Visit  Medication Sig Dispense Refill   Coenzyme Q10 (CO Q-10) 300 MG CAPS Take 300 mg by mouth at bedtime.     denosumab (PROLIA) 60 MG/ML SOSY injection Inject 60 mg into the skin every 6 (six) months.     diltiazem (CARDIZEM) 30 MG tablet Take 1 tablet every 4 hours AS NEEDED for AFIB heart rate >100 as long as top BP >100. (Patient not taking: Reported on 05/22/2023) 30 tablet 1   dronedarone (MULTAQ) 400 MG tablet TAKE 1 TABLET (400 MG TOTAL) BY MOUTH 2 (TWO) TIMES DAILY WITH A MEAL. 60 tablet 6   FIBER PO Take 2 tablets by mouth at bedtime. Chewable     Folic Acid-Vit B6-Vit B12 (HOMOCYSTEINE FORMULA PO) Take 2 capsules by mouth at bedtime.     gabapentin (NEURONTIN) 100 MG capsule TAKING 1 CAPSULE IN THE AM, SOMETIMES AN EXTRA CAP IF NEEDED DURING THE DAY, AND 2 CAPSULES AT NIGHT 120 capsule 0   losartan (COZAAR) 100 MG tablet TAKE 1 TABLET BY MOUTH EVERY DAY 90 tablet 2   MAGNESIUM OXIDE PO Taking 1 capsule in the pm- Mag7 prescription     metFORMIN (GLUCOPHAGE-XR) 500 MG 24 hr tablet Take 500 mg by mouth daily.     metoprolol succinate (TOPROL-XL) 25 MG 24 hr tablet Take 1 tablet (25 mg total) by mouth in the morning and at bedtime. 180 tablet 1   Multiple Vitamin (MULTIVITAMIN ADULT PO) ARK- Taking 1 capsule by mouth in the morning - Prescription      Multiple Vitamin (MULTIVITAMIN ADULT PO) Dimpro- Taking 1 capsule by mouth in the morning- Prescription (Patient not taking: Reported on 05/22/2023)     Nutritional Supplements (HOMOCYSTEINE SUPPORT PO) Take 1 capsule by mouth at bedtime.     polyethylene glycol powder (GLYCOLAX/MIRALAX) 17 GM/SCOOP powder Take by mouth as needed.     predniSONE (DELTASONE) 1 MG tablet Take 3 mg  by mouth daily with breakfast.     PROGESTERONE PO Taking 1 capsule by mouth daily- 200mg  DR- Leanora Ivanoff with Dinner     rosuvastatin (CRESTOR) 10 MG tablet Take 10 mg by mouth daily.     sodium chloride (OCEAN) 0.65 % SOLN nasal spray Place 1 spray into both nostrils as needed for congestion (As directed).      XARELTO 15 MG TABS tablet TAKE 1 TABLET (15 MG TOTAL) BY MOUTH DAILY WITH SUPPER 30 tablet 7   Zinc 30 MG CAPS Take 1 capsule by mouth at bedtime.     No facility-administered medications prior to visit.    PAST MEDICAL HISTORY: Past Medical History:  Diagnosis Date   Adrenal insufficiency (HCC)    takes prednisone 5mg  daily   Cervical disc disease    CVA (cerebral infarction)    a. 11/2011   Depression    Diverticulosis    GERD (gastroesophageal reflux disease)    pt denies symptoms with this   Glaucoma    Hyperlipidemia    a. Diagnosed 12/2011   Hypertension    Insomnia with sleep apnea    Internal hemorrhoid    Migraine headache    Osteoarthritis    Osteoporosis    Persistent atrial fibrillation (HCC)    a.  Diagnosed 07/2011;  b. Normal nuclear myoview 07/2011 and normal EF at that time;  c. Most recent echo 2/20 /13 - EF 55-60% wit mild-mod MR.;  d. s/p DCCV 01/12/2012   Polymyalgia rheumatica (HCC)    a. On chronic steroids   Tubular adenoma of colon 04/2007    PAST SURGICAL HISTORY: Past Surgical History:  Procedure Laterality Date   ATRIAL FIBRILLATION ABLATION N/A 02/16/2017   Procedure: Atrial Fibrillation Ablation;  Surgeon: Hillis Range, MD;  Location: [facility] INVASIVE CV LAB;  Service:  Cardiovascular;  Laterality: N/A;   BREAST LUMPECTOMY  1980's   right  x2 (benign)   CARDIOVERSION  01/12/2012   Procedure: CARDIOVERSION;  Surgeon: Lewayne Bunting, MD;  Location: [facility] OR;  Service: Cardiovascular;  Laterality: N/A;   CARDIOVERSION  03/28/2012   Procedure: CARDIOVERSION;  Surgeon: Laurey Morale, MD;  Location: [facility] OR;  Service: Cardiovascular;  Laterality: N/A;   CARDIOVERSION  06/29/2012   Procedure: CARDIOVERSION;  Surgeon: Cassell Clement, MD;  Location: [facility] ENDOSCOPY;  Service: Cardiovascular;  Laterality: N/A;   CARDIOVERSION N/A 12/07/2015   Procedure: CARDIOVERSION;  Surgeon: Wendall Stade, MD;  Location: [facility] ENDOSCOPY;  Service: Cardiovascular;  Laterality: N/A;   ELECTROPHYSIOLOGIC STUDY N/A 01/19/2016   AFib ablation by Dr Johney Frame   GLAUCOMA SURGERY     TEE WITHOUT CARDIOVERSION N/A 01/19/2016   Procedure: TRANSESOPHAGEAL ECHOCARDIOGRAM (TEE);  Surgeon: Vesta Mixer, MD;  Location: [facility] ENDOSCOPY;  Service: Cardiovascular;  Laterality: N/A;   TONSILLECTOMY  age 36    FAMILY HISTORY: Family History  Problem Relation Age of Onset   Stroke Mother        Deceased @ 23   Heart failure Mother        died from   Liver cancer Father        Deceased @ 60   Colon cancer Paternal Grandmother    Sleep apnea Neg Hx     SOCIAL HISTORY: Social History   Socioeconomic History   Marital status: Married    Spouse name: Karren Burly   Number of children: 3   Years of education: college   Highest education level: Not on file  Occupational History  Occupation: retired     CommentResearch scientist (life sciences) , [place]  Tobacco Use   Smoking status: Never   Smokeless tobacco: Never  Vaping Use   Vaping status: Never Used  Substance and Sexual Activity   Alcohol use: Yes    Comment: rare   Drug use: No   Sexual activity: Not on file  Other Topics Concern   Not on file  Social History Narrative   Pt lives in [place] with spouse.  Karren Burly)  She was previously a Engineer, site.    Caffeine-  None   Right handed   Patient has three children.   Patient has a college education.   Social Determinants of Health   Financial Resource Strain: Not on file  Food Insecurity: Not on file  Transportation Needs: Not on file  Physical Activity: Not on file  Stress: Not on file  Social Connections: Not on file  Intimate Partner Violence: Not on file      PHYSICAL EXAM  Vitals:   07/05/23 1007  Weight: 129 lb (58.5 kg)  Height: 5\' 2"  (1.575 m)     Body mass index is 23.59 kg/m.  Generalized: Well developed, in no acute distress    Neurological examination  Mentation: Alert oriented to time, place, history taking. Follows all commands speech and language fluent Cranial nerve II-XII: Extraocular movements were full, visual field were full on confrontational test Head turning and shoulder shrug  were normal and symmetric.  Facial symmetry noted  DIAGNOSTIC DATA (LABS, IMAGING, TESTING) - I reviewed patient records, labs, notes, testing and imaging myself where available.  Lab Results  Component Value Date   WBC 6.6 07/13/2022   HGB 12.8 07/13/2022   HCT 39.8 07/13/2022   MCV 98.0 07/13/2022   PLT 224 07/13/2022      Component Value Date/Time   NA 138 07/13/2022 1023   NA 142 03/16/2020 1032   K 4.3 07/13/2022 1023   CL 103 07/13/2022 1023   CO2 29 07/13/2022 1023   GLUCOSE 90 07/13/2022 1023   BUN 15 07/13/2022 1023   BUN 15 03/16/2020 1032   CREATININE 0.82 07/13/2022 1023   CREATININE 1.15 (H) 01/12/2016 1247   CALCIUM 9.2 07/13/2022 1023   PROT 6.3 (L) 08/04/2015 1415   ALBUMIN 3.9 08/04/2015 1415   AST 34 08/04/2015 1415   ALT 23 08/04/2015 1415   ALKPHOS 72 08/04/2015 1415   BILITOT 0.9 08/04/2015 1415   GFRNONAA >60 07/13/2022 1023   GFRAA 68 03/16/2020 1032   Lab Results  Component Value Date   CHOL 146 08/21/2018   HDL 86 08/21/2018   LDLCALC 47 08/21/2018   TRIG 65 08/21/2018   CHOLHDL 1.7 08/21/2018   Lab Results  Component Value Date    HGBA1C 5.8 (H) 12/21/2011   No results found for: "VITAMINB12" Lab Results  Component Value Date   TSH 0.302 (L) 06/23/2016      ASSESSMENT AND PLAN 82 y.o. year old female  has a past medical history of Adrenal insufficiency (HCC), Cervical disc disease, CVA (cerebral infarction), Depression, Diverticulosis, GERD (gastroesophageal reflux disease), Glaucoma, Hyperlipidemia, Hypertension, Insomnia with sleep apnea, Internal hemorrhoid, Migraine headache, Osteoarthritis, Osteoporosis, Persistent atrial fibrillation (HCC), Polymyalgia rheumatica (HCC), and Tubular adenoma of colon (04/2007). here with:  OSA on CPAP  - Home sleep test ordered.  Pending results we will order new machine - Encourage patient to use CPAP nightly and > 4 hours each night   2. Headaches  - Continue gabapentin 100 mg  3 times daily.   - F/U in 1 year or sooner if needed    Butch Penny, MSN, NP-C 07/04/2023, 4:19 PM [facility] [address], [address] [place], [place] [address] [phone]

## 2023-07-05 ENCOUNTER — Ambulatory Visit: Payer: PPO | Admitting: Adult Health

## 2023-07-05 ENCOUNTER — Encounter: Payer: Self-pay | Admitting: Adult Health

## 2023-07-05 VITALS — Ht 62.0 in | Wt 129.0 lb

## 2023-07-05 DIAGNOSIS — G4733 Obstructive sleep apnea (adult) (pediatric): Secondary | ICD-10-CM

## 2023-07-05 DIAGNOSIS — G4489 Other headache syndrome: Secondary | ICD-10-CM

## 2023-07-05 MED ORDER — GABAPENTIN 100 MG PO CAPS: 100.0000 mg | ORAL_CAPSULE | Freq: Three times a day (TID) | ORAL | 3 refills | Status: DC

## 2023-07-06 ENCOUNTER — Telehealth: Payer: Self-pay | Admitting: Adult Health

## 2023-07-06 NOTE — Telephone Encounter (Signed)
 HST- HTA pending faxed notes.

## 2023-07-10 ENCOUNTER — Encounter: Payer: Self-pay | Admitting: Cardiology

## 2023-07-10 ENCOUNTER — Ambulatory Visit: Payer: PPO | Attending: Cardiology | Admitting: Cardiology

## 2023-07-10 VITALS — BP 134/70 | HR 74 | Ht 62.5 in | Wt 129.4 lb

## 2023-07-10 DIAGNOSIS — D6869 Other thrombophilia: Secondary | ICD-10-CM

## 2023-07-10 DIAGNOSIS — I480 Paroxysmal atrial fibrillation: Secondary | ICD-10-CM | POA: Diagnosis not present

## 2023-07-10 NOTE — Progress Notes (Signed)
Cardiology Office Note:    Date:  07/10/2023   ID:  DONYELLE HOEN, DOB February 10, 1941, MRN [id number]  PCP:  Rodrigo Ran, MD   [facility] Providers Cardiologist:  Donato Schultz, MD Electrophysiologist:  Hillis Range, MD (Inactive)     Referring MD: Newman Nip, NP    History of Present Illness:    Lorraine Little is a 82 y.o. female Discussed the use of AI scribe software for clinical note transcription with the patient, who gave verbal consent to proceed.  History of Present Illness   Lorraine Little, a patient with a history of atrial fibrillation, has been managed with Multaq 400mg  twice daily, Xarelto 15mg , and Metoprolol. She reports that her atrial fibrillation has been under control since starting Multaq and has not experienced any episodes. However, she expresses concern about the long-term use of this medication, given her past experience with amiodarone ceasing to work and two ablations in 2017 and 2018 not providing lasting relief.  In addition to her heart condition, she has high cholesterol, managed with rosuvastatin 10mg  daily. She started this medication several months ago when her LDL was found to be 161. She also uses a CPAP machine for sleep apnea and is due for a sleep test to potentially update her machine. She has a history of digestive problems, which have impacted her ability [facility] [employer] Despite this, she remains active with gardening and other activities.       - [facility] - Uses CPAP machine for sleep  Past Medical History:  Diagnosis Date   Adrenal insufficiency (HCC)    takes prednisone 5mg  daily   Cervical disc disease    CVA (cerebral infarction)    a. 11/2011   Depression    Diverticulosis    GERD (gastroesophageal reflux disease)    pt denies symptoms with this   Glaucoma    Hyperlipidemia    a. Diagnosed 12/2011   Hypertension    Insomnia with sleep apnea    Internal  hemorrhoid    Migraine headache    Osteoarthritis    Osteoporosis    Persistent atrial fibrillation (HCC)    a.  Diagnosed 07/2011;  b. Normal nuclear myoview 07/2011 and normal EF at that time;  c. Most recent echo 2/20 /13 - EF 55-60% wit mild-mod MR.;  d. s/p DCCV 01/12/2012   Polymyalgia rheumatica (HCC)    a. On chronic steroids   Tubular adenoma of colon 04/2007    Past Surgical History:  Procedure Laterality Date   ATRIAL FIBRILLATION ABLATION N/A 02/16/2017   Procedure: Atrial Fibrillation Ablation;  Surgeon: Hillis Range, MD;  [facility] INVASIVE CV LAB;  Service: Cardiovascular;  Laterality: N/A;   BREAST LUMPECTOMY  1980's   right  x2 (benign)   CARDIOVERSION  01/12/2012   Procedure: CARDIOVERSION;  Surgeon: Lewayne Bunting, MD;  Location: [facility] OR;  Service: Cardiovascular;  Laterality: N/A;   CARDIOVERSION  03/28/2012   Procedure: CARDIOVERSION;  Surgeon: Laurey Morale, MD;  Location: [facility] OR;  Service: Cardiovascular;  Laterality: N/A;   CARDIOVERSION  06/29/2012   Procedure: CARDIOVERSION;  Surgeon: Cassell Clement, MD;  Location: [facility] ENDOSCOPY;  Service: Cardiovascular;  Laterality: N/A;   CARDIOVERSION N/A 12/07/2015   Procedure: CARDIOVERSION;  Surgeon: Wendall Stade, MD;  Location: [facility] ENDOSCOPY;  Service: Cardiovascular;  Laterality: N/A;   ELECTROPHYSIOLOGIC STUDY N/A 01/19/2016   AFib ablation by Dr Johney Frame  GLAUCOMA SURGERY     TEE WITHOUT CARDIOVERSION N/A 01/19/2016   Procedure: TRANSESOPHAGEAL ECHOCARDIOGRAM (TEE);  Surgeon: Vesta Mixer, MD;  Location: [facility] ENDOSCOPY;  Service: Cardiovascular;  Laterality: N/A;   TONSILLECTOMY  age 15    Current Medications: Current Meds  Medication Sig   Coenzyme Q10 (CO Q-10) 300 MG CAPS Take 300 mg by mouth at bedtime.   dronedarone (MULTAQ) 400 MG tablet TAKE 1 TABLET (400 MG TOTAL) BY MOUTH 2 (TWO) TIMES DAILY WITH A MEAL.   FIBER PO Take 2 tablets by mouth at bedtime. Chewable   gabapentin (NEURONTIN) 100 MG capsule Take 1  capsule (100 mg total) by mouth 3 (three) times daily.   losartan (COZAAR) 100 MG tablet TAKE 1 TABLET BY MOUTH EVERY DAY   metoprolol succinate (TOPROL-XL) 25 MG 24 hr tablet Take 1 tablet (25 mg total) by mouth in the morning and at bedtime.   predniSONE (DELTASONE) 1 MG tablet Take 3 mg by mouth daily with breakfast.   rosuvastatin (CRESTOR) 10 MG tablet Take 10 mg by mouth daily.   XARELTO 15 MG TABS tablet TAKE 1 TABLET (15 MG TOTAL) BY MOUTH DAILY WITH SUPPER     Allergies:   Ace inhibitors, Estrogens, Flecainide, and Pantoprazole sodium   Social History   Socioeconomic History   Marital status: Married    Spouse name: Dwight   Number of children: 3   Years of education: college   Highest education level: Not on file  Occupational History   Occupation: retired     Comment: Runner, broadcasting/film/video , [place]  Tobacco Use   Smoking status: Never   Smokeless tobacco: Never  Vaping Use   Vaping status: Never Used  Substance and Sexual Activity   Alcohol use: Yes    Comment: rare   Drug use: No   Sexual activity: Not on file  Other Topics Concern   Not on file  Social History Narrative   Pt lives in [place] with spouse.  Karren Burly)  She was previously a Engineer, site.    Caffeine- None   Right handed   Patient has three children.   Patient has a college education.   Social Determinants of Health   Financial Resource Strain: Not on file  Food Insecurity: Not on file  Transportation Needs: Not on file  Physical Activity: Not on file  Stress: Not on file  Social Connections: Not on file     Family History: The patient's family history includes Colon cancer in her paternal grandmother; Heart failure in her mother; Liver cancer in her father; Stroke in her mother. There is no history of Sleep apnea.  ROS:   Please see the history of present illness.     All other systems reviewed and are negative.  EKGs/Labs/Other Studies Reviewed:    The following studies were reviewed  today: Cardiac Studies & Procedures     STRESS TESTS  MYOCARDIAL PERFUSION IMAGING 11/23/2016  Narrative  Nuclear stress EF: 74%.  Blood pressure demonstrated a normal response to exercise.  No T wave inversion was noted during stress.  There was no ST segment deviation noted during stress.  The study is normal.  This is a low risk study.  Normal perfusion. LVEF 74% with normal wall motion. This is a low risk study.   ECHOCARDIOGRAM  ECHOCARDIOGRAM COMPLETE 08/04/2022  Narrative ECHOCARDIOGRAM REPORT    Patient Name:   Lorraine Little Date of Exam: 08/04/2022 Medical Rec #:  [id number]  Height:       62.0 in Accession #:    [id number]       Weight:       132.4 lb Date of Birth:  1941/04/23         BSA:          1.604 m Patient Age:    81 years         BP:           160/73 mmHg Patient Gender: F                HR:           86 bpm. Exam Location:  Outpatient  Procedure: 2D Echo, Cardiac Doppler and Color Doppler  Indications:    Atrial fibrillation  History:        Patient has prior history of Echocardiogram examinations, most recent 12/19/2016. Arrythmias:Atrial Fibrillation; Risk Factors:Hypertension. Hx CVA. S/P ablation.  Sonographer:    Ross Ludwig RDCS (AE) Referring Phys: [phone] DONNA C CARROLL  IMPRESSIONS   1. Left ventricular ejection fraction, by estimation, is 60 to 65%. The left ventricle has normal function. The left ventricle has no regional wall motion abnormalities. Left ventricular diastolic parameters are indeterminate. 2. Right ventricular systolic function is normal. The right ventricular size is normal. There is mildly elevated pulmonary artery systolic pressure. The estimated right ventricular systolic pressure is 44.0 mmHg. 3. Left atrial size was moderately dilated. 4. The mitral valve is normal in structure. Mild to moderate mitral valve regurgitation. No evidence of mitral stenosis. 5. Tricuspid valve regurgitation is moderate. 6.  The aortic valve is tricuspid. There is mild calcification of the aortic valve. There is mild thickening of the aortic valve. Aortic valve regurgitation is not visualized. Aortic valve sclerosis is present, with no evidence of aortic valve stenosis. Aortic valve mean gradient measures 3.0 mmHg. Aortic valve Vmax measures 1.05 m/s. 7. The inferior vena cava is normal in size with greater than 50% respiratory variability, suggesting right atrial pressure of 3 mmHg.  FINDINGS Left Ventricle: Left ventricular ejection fraction, by estimation, is 60 to 65%. The left ventricle has normal function. The left ventricle has no regional wall motion abnormalities. The left ventricular internal cavity size was normal in size. There is no left ventricular hypertrophy. Left ventricular diastolic parameters are indeterminate.  Right Ventricle: The right ventricular size is normal. No increase in right ventricular wall thickness. Right ventricular systolic function is normal. There is mildly elevated pulmonary artery systolic pressure. The tricuspid regurgitant velocity is 3.20 m/s, and with an assumed right atrial pressure of 3 mmHg, the estimated right ventricular systolic pressure is 44.0 mmHg.  Left Atrium: Left atrial size was moderately dilated.  Right Atrium: Right atrial size was normal in size.  Pericardium: There is no evidence of pericardial effusion.  Mitral Valve: The mitral valve is normal in structure. Mild to moderate mitral valve regurgitation. No evidence of mitral valve stenosis. MV peak gradient, 9.9 mmHg. The mean mitral valve gradient is 3.0 mmHg.  Tricuspid Valve: The tricuspid valve is normal in structure. Tricuspid valve regurgitation is moderate . No evidence of tricuspid stenosis.  Aortic Valve: The aortic valve is tricuspid. There is mild calcification of the aortic valve. There is mild thickening of the aortic valve. Aortic valve regurgitation is not visualized. Aortic valve  sclerosis is present, with no evidence of aortic valve stenosis. Aortic valve mean gradient measures 3.0 mmHg. Aortic valve peak gradient measures 4.4 mmHg. Aortic valve  area, by VTI measures 1.52 cm.  Pulmonic Valve: The pulmonic valve was normal in structure. Pulmonic valve regurgitation is not visualized. No evidence of pulmonic stenosis.  Aorta: The aortic root is normal in size and structure.  Venous: The inferior vena cava is normal in size with greater than 50% respiratory variability, suggesting right atrial pressure of 3 mmHg.  IAS/Shunts: No atrial level shunt detected by color flow Doppler.   LEFT VENTRICLE PLAX 2D LVIDd:         3.80 cm   Diastology LVIDs:         2.30 cm   LV e' medial:    7.40 cm/s LV PW:         0.90 cm   LV E/e' medial:  20.9 LV IVS:        1.00 cm   LV e' lateral:   9.25 cm/s LVOT diam:     1.50 cm   LV E/e' lateral: 16.8 LV SV:         35 LV SV Index:   22 LVOT Area:     1.77 cm   RIGHT VENTRICLE             IVC RV Basal diam:  2.80 cm     IVC diam: 1.60 cm RV S prime:     12.70 cm/s TAPSE (M-mode): 2.4 cm  LEFT ATRIUM             Index        RIGHT ATRIUM           Index LA diam:        3.50 cm 2.18 cm/m   RA Area:     15.00 cm LA Vol (A2C):   53.6 ml 33.41 ml/m  RA Volume:   32.00 ml  19.95 ml/m LA Vol (A4C):   69.2 ml 43.13 ml/m LA Biplane Vol: 62.7 ml 39.08 ml/m AORTIC VALVE AV Area (Vmax):    1.49 cm AV Area (Vmean):   1.49 cm AV Area (VTI):     1.52 cm AV Vmax:           105.00 cm/s AV Vmean:          79.100 cm/s AV VTI:            0.232 m AV Peak Grad:      4.4 mmHg AV Mean Grad:      3.0 mmHg LVOT Vmax:         88.60 cm/s LVOT Vmean:        66.600 cm/s LVOT VTI:          0.199 m LVOT/AV VTI ratio: 0.86  AORTA Ao Root diam: 3.20 cm Ao Asc diam:  3.10 cm  MITRAL VALVE                TRICUSPID VALVE MV Area (PHT): 4.10 cm     TR Peak grad:   41.0 mmHg MV Area VTI:   1.32 cm     TR Vmax:        320.00 cm/s MV  Peak grad:  9.9 mmHg MV Mean grad:  3.0 mmHg     SHUNTS MV Vmax:       1.57 m/s     Systemic VTI:  0.20 m MV Vmean:      75.2 cm/s    Systemic Diam: 1.50 cm MV Decel Time: 185 msec MV E velocity: 155.00 cm/s  Donato Schultz MD Electronically signed by Donato Schultz  MD Signature Date/Time: 08/04/2022/4:34:34 PM    Final   TEE  ECHO TEE 01/19/2016  Narrative *[place]* *[facility]* [address] [place], [place] [address] [phone]  ------------------------------------------------------------------- Transesophageal Echocardiography  Patient:    Rawan, Mister MR #:       [id number] Study Date: 01/19/2016 Gender:     F Age:        50 Height:     157.5 cm Weight:     54.5 kg BSA:        1.55 m^2 Pt. Status: Room:  ATTENDING    Kristeen Lorraine, M.D. PERFORMING   Kristeen Lorraine, M.D. SONOGRAPHER  Leta Jungling, RDCS ADMITTING    Nahser, Cynda [place], Amber K  cc:  -------------------------------------------------------------------  ------------------------------------------------------------------- Indications:      Atrial fibrillation - 427.31.  ------------------------------------------------------------------- History:   PMH:  Obstructive Sleep Apnea. GERD.  Stroke.  Risk factors:  Family history of coronary artery disease. Hypertension. Dyslipidemia.  ------------------------------------------------------------------- Study Conclusions  - Left ventricle: The cavity size was normal. Wall thickness was normal. Systolic function was normal. - Aortic valve: No evidence of vegetation. - Mitral valve: There was mild to moderate regurgitation. - Left atrium: The atrium was dilated. No evidence of thrombus in the atrial cavity or appendage. - Atrial septum: No defect or patent foramen ovale was identified. - Tricuspid valve: No evidence of vegetation.  Diagnostic transesophageal echocardiography.  2D and color Doppler. Birthdate:   Patient birthdate: 03-30-1941.  Age:  Patient is 82 yr old.  Sex:  Gender: female.    BMI: 22 kg/m^2.  Blood pressure: 98/42  Patient status:  Inpatient.  Study date:  Study date: 01/19/2016. Study time: 09:28 AM.  Location:  Endoscopy.  -------------------------------------------------------------------  ------------------------------------------------------------------- Left ventricle:  The cavity size was normal. Wall thickness was normal. Systolic function was normal.  ------------------------------------------------------------------- Aortic valve:   Structurally normal valve.   Cusp separation was normal.  No evidence of vegetation.  Doppler:  There was no regurgitation.  ------------------------------------------------------------------- Aorta:  The aorta was normal, not dilated, and non-diseased.  ------------------------------------------------------------------- Mitral valve:  There is moderate MR but no reversal of flow in the pulmonary vein.  Doppler:  There was mild to moderate regurgitation.  ------------------------------------------------------------------- Left atrium:  The atrium was dilated.  No evidence of thrombus in the atrial cavity or appendage.  ------------------------------------------------------------------- Atrial septum:  No defect or patent foramen ovale was identified.  ------------------------------------------------------------------- Tricuspid valve:   Structurally normal valve.   Leaflet separation was normal.  No evidence of vegetation.  Doppler:  There was no regurgitation.  ------------------------------------------------------------------- Post procedure conclusions Ascending Aorta:  - The aorta was normal, not dilated, and non-diseased.  ------------------------------------------------------------------- Prepared and Electronically Authenticated by  Kristeen Lorraine, M.D. 2017-03-21T15:52:49   MONITORS  LONG TERM MONITOR (3-14  DAYS) 08/01/2022  Narrative Patch Wear Time:  13 days and 23 hours  Predominant rhythm was sinus rhythm 50 SVT episodes, all less than 25 seconds Less than 1% ventricular and supraventricular ectopy Triggered episodes associated with sinus rhythm No atrial fibrillation recorded            EKG:  NSR     Recent Labs: 07/13/2022: BUN 15; Creatinine, Ser 0.82; Hemoglobin 12.8; Platelets 224; Potassium 4.3; Sodium 138  Recent Lipid Panel    Component Value Date/Time   CHOL 146 08/21/2018 0846   TRIG 65 08/21/2018 0846   HDL 86 08/21/2018 0846   CHOLHDL 1.7 08/21/2018 0846  CHOLHDL 2.7 12/21/2011 0550   VLDL 12 12/21/2011 0550   LDLCALC 47 08/21/2018 0846   LABS Hb: 12.8 g/dL (16/1096) Cr: 0.8 mg/dL (01/5408) LDL: 811 mg/dL (91/4782)  DIAGNOSTIC Echocardiogram: Normal pump function, EF 65%, mild to moderate mitral valve regurgitation, moderately dilated left atrium (07/2022)  Risk Assessment/Calculations:    CHA2DS2-VASc Score = 7   This indicates a 11.2% annual risk of stroke. The patient's score is based upon: CHF History: 0 HTN History: 1 Diabetes History: 0 Stroke History: 2 Vascular Disease History: 1 (aortic atherosclerosis) Age Score: 2 Gender Score: 1               Physical Exam:    VS:  BP 134/70   Pulse 74   Ht 5' 2.5" (1.588 m)   Wt 129 lb 6.4 oz (58.7 kg)   SpO2 98%   BMI 23.29 kg/m     Wt Readings from Last 3 Encounters:  07/10/23 129 lb 6.4 oz (58.7 kg)  07/05/23 129 lb (58.5 kg)  05/22/23 123 lb 12.8 oz (56.2 kg)     GEN:  Well nourished, well developed in no acute distress HEENT: Normal NECK: No JVD; No carotid bruits LYMPHATICS: No lymphadenopathy CARDIAC: RRR, no murmurs, rubs, gallops RESPIRATORY:  Clear to auscultation without rales, wheezing or rhonchi  ABDOMEN: Soft, non-tender, non-distended MUSCULOSKELETAL:  Minimal ankle edema; No deformity  SKIN: Warm and dry, varicose NEUROLOGIC:  Alert and oriented x  3 PSYCHIATRIC:  Normal affect   ASSESSMENT:    1. Paroxysmal atrial fibrillation (HCC)   2. Hypercoagulable state due to paroxysmal atrial fibrillation (HCC)    PLAN:    In order of problems listed above:  Assessment and Plan    Parox Atrial Fibrillation Stable on Multaq 400mg  BID. No recent episodes of AFib. Two prior ablations in 2017 and 2018. Left atrium moderately dilated on recent echocardiogram. -Continue Multaq 400mg  BID.  Anticoagulation On Xarelto 15mg  for stroke prevention in AFib. Hemoglobin and creatinine stable. -Continue Xarelto 15mg  daily.  Hypertension Controlled on Losartan 100mg  daily.  Hyperlipidemia LDL elevated at 161 in November 2023. On Rosuvastatin 10mg  daily. -Continue Rosuvastatin 10mg  daily. Dr. Waynard Edwards started.   Obstructive Sleep Apnea Using CPAP regularly. Pending home sleep study for potential CPAP machine upgrade. -Continue CPAP use nightly.  Follow-up in 1 year.                Medication Adjustments/Labs and Tests Ordered: Current medicines are reviewed at length with the patient today.  Concerns regarding medicines are outlined above.  No orders of the defined types were placed in this encounter.  No orders of the defined types were placed in this encounter.   Patient Instructions  Medication Instructions:  The current medical regimen is effective;  continue present plan and medications.  *If you need a refill on your cardiac medications before your next appointment, please call your pharmacy*  Follow-Up: At [facility], you and your health needs are our priority.  As part of our continuing mission to provide you with exceptional heart care, we have created designated Provider Care Teams.  These Care Teams include your primary Cardiologist (physician) and Advanced Practice Providers (APPs -  Physician Assistants and Nurse Practitioners) who all work together to provide you with the care you need, when you need  it.  We recommend signing up for the patient portal called "MyChart".  Sign up information is provided on this After Visit Summary.  MyChart is used to connect with  patients for Virtual Visits (Telemedicine).  Patients are able to view lab/test results, encounter notes, upcoming appointments, etc.  Non-urgent messages can be sent to your provider as well.   To learn more about what you can do with MyChart, go to [email or web address].    Your next appointment:   1 year(s)  Provider:   Donato Schultz, MD        Signed, Donato Schultz, MD  07/10/2023 2:15 PM    [facility]

## 2023-07-10 NOTE — Patient Instructions (Signed)

## 2023-07-12 NOTE — Telephone Encounter (Signed)
HST- HTA Berkley Harvey: [id number] (exp. 07/06/23 to 10/04/23)

## 2023-07-19 ENCOUNTER — Other Ambulatory Visit (HOSPITAL_COMMUNITY): Payer: Self-pay | Admitting: Physician Assistant

## 2023-07-27 ENCOUNTER — Encounter (HOSPITAL_COMMUNITY): Payer: Self-pay | Admitting: Physician Assistant

## 2023-07-27 ENCOUNTER — Ambulatory Visit (HOSPITAL_COMMUNITY)
Admission: RE | Admit: 2023-07-27 | Discharge: 2023-07-27 | Disposition: A | Discharge status: Home / Self Care | Payer: PPO | Source: Ambulatory Visit | Attending: Physician Assistant | Admitting: Physician Assistant

## 2023-07-27 VITALS — BP 124/60 | HR 74 | Ht 62.5 in | Wt 129.8 lb

## 2023-07-27 DIAGNOSIS — D6869 Other thrombophilia: Secondary | ICD-10-CM | POA: Insufficient documentation

## 2023-07-27 DIAGNOSIS — Z5181 Encounter for therapeutic drug level monitoring: Secondary | ICD-10-CM

## 2023-07-27 DIAGNOSIS — Z7901 Long term (current) use of anticoagulants: Secondary | ICD-10-CM | POA: Diagnosis not present

## 2023-07-27 DIAGNOSIS — M353 Polymyalgia rheumatica: Secondary | ICD-10-CM | POA: Insufficient documentation

## 2023-07-27 DIAGNOSIS — Z79899 Other long term (current) drug therapy: Secondary | ICD-10-CM | POA: Diagnosis not present

## 2023-07-27 DIAGNOSIS — I480 Paroxysmal atrial fibrillation: Secondary | ICD-10-CM | POA: Diagnosis not present

## 2023-07-27 DIAGNOSIS — E785 Hyperlipidemia, unspecified: Secondary | ICD-10-CM | POA: Diagnosis not present

## 2023-07-27 DIAGNOSIS — G4733 Obstructive sleep apnea (adult) (pediatric): Secondary | ICD-10-CM | POA: Insufficient documentation

## 2023-07-27 DIAGNOSIS — Z8673 Personal history of transient ischemic attack (TIA), and cerebral infarction without residual deficits: Secondary | ICD-10-CM | POA: Insufficient documentation

## 2023-07-27 DIAGNOSIS — I10 Essential (primary) hypertension: Secondary | ICD-10-CM | POA: Insufficient documentation

## 2023-07-27 DIAGNOSIS — G4700 Insomnia, unspecified: Secondary | ICD-10-CM | POA: Insufficient documentation

## 2023-07-27 LAB — CBC
HCT: 39.7 % (ref 36.0–46.0)
Hemoglobin: 12.3 g/dL (ref 12.0–15.0)
MCH: 29.9 pg (ref 26.0–34.0)
MCHC: 31 g/dL (ref 30.0–36.0)
MCV: 96.6 fL (ref 80.0–100.0)
Platelets: 220 10*3/uL (ref 150–400)
RBC: 4.11 MIL/uL (ref 3.87–5.11)
RDW: 14.3 % (ref 11.5–15.5)
WBC: 6.7 10*3/uL (ref 4.0–10.5)
nRBC: 0 % (ref 0.0–0.2)

## 2023-07-27 LAB — BASIC METABOLIC PANEL
Anion gap: 8 (ref 5–15)
BUN: 12 mg/dL (ref 8–23)
CO2: 25 mmol/L (ref 22–32)
Calcium: 8.5 mg/dL — ABNORMAL LOW (ref 8.9–10.3)
Chloride: 103 mmol/L (ref 98–111)
Creatinine, Ser: 1.08 mg/dL — ABNORMAL HIGH (ref 0.44–1.00)
GFR, Estimated: 51 mL/min — ABNORMAL LOW (ref >60)
Glucose, Bld: 90 mg/dL (ref 70–99)
Potassium: 4.3 mmol/L (ref 3.5–5.1)
Sodium: 136 mmol/L (ref 135–145)

## 2023-07-27 MED ORDER — MULTAQ 400 MG PO TABS: 400.0000 mg | ORAL_TABLET | Freq: Two times a day (BID) | ORAL | 2 refills | Status: DC

## 2023-07-27 NOTE — Patient Instructions (Signed)
[email or web address]  [phone]

## 2023-07-27 NOTE — Progress Notes (Signed)
Primary Care Physician: Rodrigo Ran, MD Primary Cardiologist: Donato Schultz, MD  Electrophysiologist: Hillis Range, MD (Inactive)  Referring Physician: Dr Magnus Sinning is a 82 y.o. female with a history of CVA, HLD, HTN, OSA, polymyalgia rheumatica, atrial fibrillation who presents for follow up in the [facility]. Patient is s/p two ablations 01/19/16 and 02/16/17. Patient is on Xarelto for a CHADS2VASC score of 6. She was started on Multaq 05/2023.  On follow up today, patient reports that she has done well since her last visit. She has not had any interim symptoms of afib since starting Multaq. No bleeding issues on anticoagulation.   Today, she denies symptoms of palpitations, chest pain, shortness of breath, orthopnea, PND, lower extremity edema, dizziness, presyncope, syncope, snoring, daytime somnolence, bleeding, or neurologic sequela. The patient is tolerating medications without difficulties and is otherwise without complaint today.   ROS- All systems are reviewed and negative except as per the HPI above.   Atrial Fibrillation Risk Factors:  she does have symptoms or diagnosis of sleep apnea. she is compliant with CPAP therapy. she does not have a history of rheumatic fever.   Atrial Fibrillation Management history:  Previous antiarrhythmic drugs: amiodarone (hair loss), flecainide (dizziness, syncope) Previous cardioversions: 2013 x3, 2017 Previous ablations: 01/19/16, 02/16/17 Anticoagulation history: Xarelto    Physical Exam: BP 124/60   Pulse 74   Ht 5' 2.5" (1.588 m)   Wt 58.9 kg   BMI 23.36 kg/m   GEN: Well nourished, well developed in no acute distress NECK: No JVD; No carotid bruits CARDIAC: Regular rate and rhythm, no murmurs, rubs, gallops RESPIRATORY:  Clear to auscultation without rales, wheezing or rhonchi  ABDOMEN: Soft, non-tender, non-distended EXTREMITIES:  No edema; No deformity    Wt Readings from Last 3  Encounters:  07/27/23 58.9 kg  07/10/23 58.7 kg  07/05/23 58.5 kg     EKG today demonstrates  SR, 1st degree AV block Vent. rate 74 BPM PR interval 232 ms QRS duration 70 ms QT/QTcB 402/446 ms  Echo 08/04/22 demonstrated   1. Left ventricular ejection fraction, by estimation, is 60 to 65%. The  left ventricle has normal function. The left ventricle has no regional  wall motion abnormalities. Left ventricular diastolic parameters are  indeterminate.   2. Right ventricular systolic function is normal. The right ventricular  size is normal. There is mildly elevated pulmonary artery systolic  pressure. The estimated right ventricular systolic pressure is 44.0 mmHg.   3. Left atrial size was moderately dilated.   4. The mitral valve is normal in structure. Mild to moderate mitral valve  regurgitation. No evidence of mitral stenosis.   5. Tricuspid valve regurgitation is moderate.   6. The aortic valve is tricuspid. There is mild calcification of the  aortic valve. There is mild thickening of the aortic valve. Aortic valve  regurgitation is not visualized. Aortic valve sclerosis is present, with  no evidence of aortic valve stenosis.  Aortic valve mean gradient measures 3.0 mmHg. Aortic valve Vmax measures  1.05 m/s.   7. The inferior vena cava is normal in size with greater than 50%  respiratory variability, suggesting right atrial pressure of 3 mmHg.    CHA2DS2-VASc Score = 7  The patient's score is based upon: CHF History: 0 HTN History: 1 Diabetes History: 0 Stroke History: 2 Vascular Disease History: 1 (aortic atherosclerosis) Age Score: 2 Gender Score: 1       ASSESSMENT AND  PLAN: Paroxysmal Atrial Fibrillation (ICD10:  I48.0) The patient's CHA2DS2-VASc score is 7, indicating a 11.2% annual risk of stroke.   S/p afib ablation 2017 and 2018.  Patient appears to be maintaining SR Continue Multaq 400 mg BID Continue Toprol 25 mg BID Continue Xarelto 15 mg  daily Continue diltiazem 30 mg PRN q 4 hours for heart racing.  Kardia mobile for home monitoring  Secondary Hypercoagulable State (ICD10:  [phone]) The patient is at significant risk for stroke/thromboembolism based upon her CHA2DS2-VASc Score of 7.  Continue Rivaroxaban (Xarelto).   OSA  Encouraged nightly CPAP Followed by [facility] Scheduled for repeat sleep study on 08/01/23.  HTN Stable on current regimen   Follow up in the AF clinic in 6 months.    Jorja Loa PA-C [facility] [address] [place], [place] [address] [phone]

## 2023-08-01 ENCOUNTER — Ambulatory Visit: Payer: PPO | Admitting: Neurology

## 2023-08-01 DIAGNOSIS — G4733 Obstructive sleep apnea (adult) (pediatric): Secondary | ICD-10-CM | POA: Diagnosis not present

## 2023-08-01 DIAGNOSIS — I480 Paroxysmal atrial fibrillation: Secondary | ICD-10-CM

## 2023-08-01 DIAGNOSIS — G4739 Other sleep apnea: Secondary | ICD-10-CM

## 2023-08-02 NOTE — Progress Notes (Signed)
[facility]   HOME SLEEP TEST REPORT ( by Watch PAT)   STUDY DATE:  08-02-2023 [name] 82 year old female Sep 15, 1941   ORDERING CLINICIAN: Butch Penny, NP   PCP; Rodrigo Ran, MD     CLINICAL INFORMATION/HISTORY: atrial, fibrillation, OSA . AHI 0.5/h. 5-10 cm water, 95% pressure is 10 cm water   Lorraine Little is a 82 y.o. female with a history of OSA on CPAP and Headache. Returns today for follow-up.  In regards to her headaches she continues on gabapentin 100 mg TID. Feels that her headaches are under relatively good control.  Has not really had a migraine since she has been on gabapentin. she returns today for an evaluation.  Patient has an older machine.  We were unable to get a download today.  She states that she is using it nightly.     06/23/2022: [name] is an 82 year old female with a history of obstructive sleep apnea on CPAP and headaches.  She returns today for follow-up.  She did not bring her download with her.  She reports that CPAP is working well.      Epworth sleepiness score: 14/24.  FSS at 40/ 63 points.    BMI: 23.6 kg/m   Neck Circumference: NA   FINDINGS:   Sleep Summary:   Total Recording Time (hours, min): 7 hours 56 minutes       Total Sleep Time (hours, min):     7 hours 20 minutes            Percent REM (%):     23.4%                                   Respiratory Indices:   Calculated pAHI (per hour):   Following CMS criteria 32.4/h, by AASM criteria the AHI was 37.6/h.       This home sleep test device calculated 15.2% of these apneas to be central sleep apnea.                     REM pAHI:    49.3/h                                             NREM pAHI:   27.2/h                           Positional AHI: The patient slept 344 minutes in supine position associated with an AHI of 42 and this is by CMS criteria.  In nonsupine sleep position she slept only on her left for about 96 minutes with an AHI of  only 0.6/h by CMS criteria.  Snoring reached a mean volume of 42 dB and was present for almost half of the total recorded sleep time.                                                 Oxygen Saturation Statistics:    O2 Saturation Range (%):    Between a nadir at 73 and the maximum saturation of  98 with a mean saturation of 92%                                   O2 Saturation (minutes) <89%:   37.5 minutes, which is 8.5% of total recorded sleep time.        Pulse Rate Statistics:   Pulse Mean (bpm):      65 bpm           Pulse Range:    Between 57 and 78 bpm.  Please consider that cardiac rhythm data cannot be provided by this home sleep test.             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  The patient will need a new CPAP machine, and the new device will be ordered today.    RECOMMENDATION: new CPAP ordered as a ResMed auto titration CPAP device, 5-15 cm water pressure, 3 cm EPR, heated humidification and interface of patient's choice. Follow-up with nurse practitioner Everlene Other after 60 and before 90 days of PAP therapy.    INTERPRETING PHYSICIAN:   Melvyn Novas, MD

## 2023-08-10 DIAGNOSIS — G4739 Other sleep apnea: Secondary | ICD-10-CM | POA: Insufficient documentation

## 2023-08-10 NOTE — Procedures (Signed)
[facility]   HOME SLEEP TEST REPORT ( by Watch PAT)   STUDY DATE:  08-02-2023 Lorraine Little 82 year old female Sep 15, 1941   ORDERING CLINICIAN: Butch Penny, NP   PCP; Rodrigo Ran, MD     CLINICAL INFORMATION/HISTORY: atrial, fibrillation, OSA . AHI 0.5/h. 5-10 cm water, 95% pressure is 10 cm water   Lorraine Little is a 82 y.o. female with a history of OSA on CPAP and Headache. Returns today for follow-up.  In regards to her headaches she continues on gabapentin 100 mg TID. Feels that her headaches are under relatively good control.  Has not really had a migraine since she has been on gabapentin. she returns today for an evaluation.  Patient has an older machine.  We were unable to get a download today.  She states that she is using it nightly.     06/23/2022: Lorraine Little is an 82 year old female with a history of obstructive sleep apnea on CPAP and headaches.  She returns today for follow-up.  She did not bring her download with her.  She reports that CPAP is working well.      Epworth sleepiness score: 14/24.  FSS at 40/ 63 points.    BMI: 23.6 kg/m   Neck Circumference: NA   FINDINGS:   Sleep Summary:   Total Recording Time (hours, min): 7 hours 56 minutes       Total Sleep Time (hours, min):     7 hours 20 minutes            Percent REM (%):     23.4%                                   Respiratory Indices:   Calculated pAHI (per hour):   Following CMS criteria 32.4/h, by AASM criteria the AHI was 37.6/h.       This home sleep test device calculated 15.2% of these apneas to be central sleep apnea.                     REM pAHI:    49.3/h                                             NREM pAHI:   27.2/h                           Positional AHI: The patient slept 344 minutes in supine position associated with an AHI of 42 and this is by CMS criteria.  In nonsupine sleep position she slept only on her left for about 96 minutes with an AHI of  only 0.6/h by CMS criteria.  Snoring reached a mean volume of 42 dB and was present for almost half of the total recorded sleep time.                                                 Oxygen Saturation Statistics:    O2 Saturation Range (%):    Between a nadir at 73 and the maximum saturation of  98 with a mean saturation of 92%                                   O2 Saturation (minutes) <89%:   37.5 minutes, which is 8.5% of total recorded sleep time.        Pulse Rate Statistics:   Pulse Mean (bpm):      65 bpm           Pulse Range:    Between 57 and 78 bpm.  Please consider that cardiac rhythm data cannot be provided by this home sleep test.             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  The patient will need a new CPAP machine, and the new device will be ordered today.    RECOMMENDATION: new CPAP ordered as a ResMed auto titration CPAP device, 5-15 cm water pressure, 3 cm EPR, heated humidification and interface of patient's choice. Follow-up with nurse practitioner Everlene Other after 60 and before 90 days of PAP therapy.    INTERPRETING PHYSICIAN:   Melvyn Novas, MD

## 2023-08-14 NOTE — Addendum Note (Signed)
Addended by: Enedina Finner on: 08/14/2023 10:04 AM   Modules accepted: Orders

## 2023-08-15 ENCOUNTER — Telehealth: Payer: Self-pay | Admitting: *Deleted

## 2023-08-15 NOTE — Telephone Encounter (Signed)
Spoke with pt and confirmed she wants to continue using [facility] for DME.  She scheduled initial f/u appt on 11/21/23 at 1130. She understands appt is mandatory 30-90 days after setup on new machine and she needs to use it at least 4 hours at night per insurance. Pt's questions were answered. She verbalized appreciation for the call and will watch for a call from [facility].   Order sent to [facility].

## 2023-08-15 NOTE — Telephone Encounter (Signed)
-----   Message from Butch Penny sent at 08/14/2023 10:04 AM EDT ----- Order placed

## 2023-08-21 NOTE — Telephone Encounter (Signed)
[facility] confirmed receipt of order. 

## 2023-08-23 ENCOUNTER — Other Ambulatory Visit (HOSPITAL_COMMUNITY): Payer: Self-pay | Admitting: *Deleted

## 2023-08-23 MED ORDER — RIVAROXABAN 15 MG PO TABS: 15.0000 mg | ORAL_TABLET | Freq: Every day | ORAL | 6 refills | Status: DC

## 2023-09-01 ENCOUNTER — Other Ambulatory Visit (HOSPITAL_COMMUNITY): Payer: Self-pay

## 2023-09-01 MED ORDER — MULTAQ 400 MG PO TABS: 400.0000 mg | ORAL_TABLET | Freq: Two times a day (BID) | ORAL | Status: DC

## 2023-09-05 ENCOUNTER — Other Ambulatory Visit (HOSPITAL_COMMUNITY): Payer: Self-pay | Admitting: *Deleted

## 2023-09-05 MED ORDER — MULTAQ 400 MG PO TABS: 400.0000 mg | ORAL_TABLET | Freq: Two times a day (BID) | ORAL | 6 refills | Status: DC

## 2023-11-20 NOTE — Telephone Encounter (Signed)
Pt on schedule tomorrow 1/21 for initial cpap f/u. I spoke with the pt. She hasn't received her machine yet. I called [facility]. They received the order back in October and confirmed receipt on 10/21 but the task wasn't assigned to anyone. They will assign now. I asked for expedited processing. Spoke with pt again and r/s her appt for 4/9 at 1130. I asked her to call us if she hasn't received machine in 1 week. She will continue to use current machine. She thanked me for the call.

## 2023-11-21 ENCOUNTER — Ambulatory Visit: Payer: PPO | Admitting: Adult Health

## 2023-11-22 DIAGNOSIS — D649 Anemia, unspecified: Secondary | ICD-10-CM | POA: Diagnosis not present

## 2023-11-22 DIAGNOSIS — I10 Essential (primary) hypertension: Secondary | ICD-10-CM | POA: Diagnosis not present

## 2023-11-22 DIAGNOSIS — E785 Hyperlipidemia, unspecified: Secondary | ICD-10-CM | POA: Diagnosis not present

## 2023-11-29 DIAGNOSIS — I83893 Varicose veins of bilateral lower extremities with other complications: Secondary | ICD-10-CM | POA: Diagnosis not present

## 2023-11-29 DIAGNOSIS — I10 Essential (primary) hypertension: Secondary | ICD-10-CM | POA: Diagnosis not present

## 2023-11-29 DIAGNOSIS — Z7901 Long term (current) use of anticoagulants: Secondary | ICD-10-CM | POA: Diagnosis not present

## 2023-11-29 DIAGNOSIS — Z7952 Long term (current) use of systemic steroids: Secondary | ICD-10-CM | POA: Diagnosis not present

## 2023-11-29 DIAGNOSIS — D6869 Other thrombophilia: Secondary | ICD-10-CM | POA: Diagnosis not present

## 2023-11-29 DIAGNOSIS — M353 Polymyalgia rheumatica: Secondary | ICD-10-CM | POA: Diagnosis not present

## 2023-11-29 DIAGNOSIS — E785 Hyperlipidemia, unspecified: Secondary | ICD-10-CM | POA: Diagnosis not present

## 2023-11-29 DIAGNOSIS — Z0000 Encounter for general adult medical examination without abnormal findings: Secondary | ICD-10-CM | POA: Diagnosis not present

## 2023-11-29 DIAGNOSIS — G4733 Obstructive sleep apnea (adult) (pediatric): Secondary | ICD-10-CM | POA: Diagnosis not present

## 2023-11-29 DIAGNOSIS — I480 Paroxysmal atrial fibrillation: Secondary | ICD-10-CM | POA: Diagnosis not present

## 2023-11-29 DIAGNOSIS — R82998 Other abnormal findings in urine: Secondary | ICD-10-CM | POA: Diagnosis not present

## 2023-11-29 DIAGNOSIS — Z8673 Personal history of transient ischemic attack (TIA), and cerebral infarction without residual deficits: Secondary | ICD-10-CM | POA: Diagnosis not present

## 2023-11-29 DIAGNOSIS — M810 Age-related osteoporosis without current pathological fracture: Secondary | ICD-10-CM | POA: Diagnosis not present

## 2023-12-07 ENCOUNTER — Other Ambulatory Visit (HOSPITAL_COMMUNITY): Payer: Self-pay | Admitting: *Deleted

## 2023-12-08 ENCOUNTER — Ambulatory Visit (HOSPITAL_COMMUNITY)
Admission: RE | Admit: 2023-12-08 | Discharge: 2023-12-08 | Disposition: A | Discharge status: Home / Self Care | Payer: PPO | Source: Ambulatory Visit | Attending: Internal Medicine | Admitting: Internal Medicine

## 2023-12-08 DIAGNOSIS — M810 Age-related osteoporosis without current pathological fracture: Secondary | ICD-10-CM | POA: Insufficient documentation

## 2023-12-08 MED ORDER — DENOSUMAB 60 MG/ML ~~LOC~~ SOSY
PREFILLED_SYRINGE | SUBCUTANEOUS | Status: AC
  Administered 2023-12-08: 60 mg via SUBCUTANEOUS
  Filled 2023-12-08: qty 1

## 2023-12-08 MED ORDER — DENOSUMAB 60 MG/ML ~~LOC~~ SOSY: 60.0000 mg | PREFILLED_SYRINGE | Freq: Once | SUBCUTANEOUS | Status: AC

## 2023-12-25 DIAGNOSIS — H35373 Puckering of macula, bilateral: Secondary | ICD-10-CM | POA: Diagnosis not present

## 2024-01-24 ENCOUNTER — Ambulatory Visit (HOSPITAL_COMMUNITY)
Admission: RE | Admit: 2024-01-24 | Discharge: 2024-01-24 | Disposition: A | Discharge status: Home / Self Care | Payer: PPO | Source: Ambulatory Visit | Attending: Physician Assistant | Admitting: Physician Assistant

## 2024-01-24 ENCOUNTER — Encounter (HOSPITAL_COMMUNITY): Payer: Self-pay | Admitting: Physician Assistant

## 2024-01-24 ENCOUNTER — Inpatient Hospital Stay (HOSPITAL_COMMUNITY)
Admission: RE | Admit: 2024-01-24 | Discharge: 2024-01-24 | Disposition: A | Discharge status: Home / Self Care | Source: Ambulatory Visit | Attending: Physician Assistant

## 2024-01-24 VITALS — BP 128/80 | HR 79 | Ht 62.5 in | Wt 134.8 lb

## 2024-01-24 DIAGNOSIS — Z8673 Personal history of transient ischemic attack (TIA), and cerebral infarction without residual deficits: Secondary | ICD-10-CM | POA: Insufficient documentation

## 2024-01-24 DIAGNOSIS — D6869 Other thrombophilia: Secondary | ICD-10-CM | POA: Diagnosis not present

## 2024-01-24 DIAGNOSIS — Z7901 Long term (current) use of anticoagulants: Secondary | ICD-10-CM | POA: Diagnosis not present

## 2024-01-24 DIAGNOSIS — G4733 Obstructive sleep apnea (adult) (pediatric): Secondary | ICD-10-CM | POA: Diagnosis not present

## 2024-01-24 DIAGNOSIS — R0602 Shortness of breath: Secondary | ICD-10-CM | POA: Insufficient documentation

## 2024-01-24 DIAGNOSIS — I10 Essential (primary) hypertension: Secondary | ICD-10-CM | POA: Diagnosis not present

## 2024-01-24 DIAGNOSIS — Z79899 Other long term (current) drug therapy: Secondary | ICD-10-CM | POA: Diagnosis not present

## 2024-01-24 DIAGNOSIS — M353 Polymyalgia rheumatica: Secondary | ICD-10-CM | POA: Insufficient documentation

## 2024-01-24 DIAGNOSIS — R002 Palpitations: Secondary | ICD-10-CM | POA: Diagnosis not present

## 2024-01-24 DIAGNOSIS — I480 Paroxysmal atrial fibrillation: Secondary | ICD-10-CM

## 2024-01-24 DIAGNOSIS — I083 Combined rheumatic disorders of mitral, aortic and tricuspid valves: Secondary | ICD-10-CM | POA: Insufficient documentation

## 2024-01-24 DIAGNOSIS — Z5181 Encounter for therapeutic drug level monitoring: Secondary | ICD-10-CM | POA: Diagnosis not present

## 2024-01-24 NOTE — Progress Notes (Signed)
 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 female with a history of CVA, HLD, HTN, OSA, polymyalgia rheumatica, atrial fibrillation who presents for follow up in the [facility]. Patient is s/p two ablations 01/19/16 and 02/16/17. Patient is on Xarelto for stroke prevention. She was started on Multaq 05/2023.  Patient returns for follow up for atrial fibrillation and Multaq monitoring. She reports frequent symptoms of palpitations and SOB with exertion. She checks her Lourena Simmonds mobile regularly which has been labeled "possible afib". No bleeding issues on anticoagulation.   Today, he denies symptoms of chest pain, orthopnea, PND, lower extremity edema, dizziness, presyncope, syncope, snoring, daytime somnolence, bleeding, or neurologic sequela. The patient is tolerating medications without difficulties and is otherwise without complaint today.    ROS- All systems are reviewed and negative except as per the HPI above.   Atrial Fibrillation Risk Factors:  she does have symptoms or diagnosis of sleep apnea. she does not have a history of rheumatic fever.   Atrial Fibrillation Management history:  Previous antiarrhythmic drugs: amiodarone (hair loss), flecainide (dizziness, syncope) Previous cardioversions: 2013 x3, 2017 Previous ablations: 01/19/16, 02/16/17 Anticoagulation history: Xarelto    Physical Exam: BP 128/80   Pulse 79   Ht 5' 2.5" (1.588 m)   Wt 61.1 kg   BMI 24.26 kg/m   GEN: Well nourished, well developed in no acute distress CARDIAC: Regular rate and rhythm with occasional ectopy, no murmurs, rubs, gallops RESPIRATORY:  Clear to auscultation without rales, wheezing or rhonchi  ABDOMEN: Soft, non-tender, non-distended EXTREMITIES:  No edema; No deformity    Wt Readings from Last 3  Encounters:  01/24/24 61.1 kg  07/27/23 58.9 kg  07/10/23 58.7 kg     EKG today demonstrates  SR, 1st degree AV block, frequent PACs Vent. rate 79 BPM PR interval 250 ms QRS duration 72 ms QT/QTcB 402/460 ms (~440 ms measured manually)   Echo 08/04/22 demonstrated   1. Left ventricular ejection fraction, by estimation, is 60 to 65%. The  left ventricle has normal function. The left ventricle has no regional  wall motion abnormalities. Left ventricular diastolic parameters are  indeterminate.   2. Right ventricular systolic function is normal. The right ventricular  size is normal. There is mildly elevated pulmonary artery systolic  pressure. The estimated right ventricular systolic pressure is 44.0 mmHg.   3. Left atrial size was moderately dilated.   4. The mitral valve is normal in structure. Mild to moderate mitral valve  regurgitation. No evidence of mitral stenosis.   5. Tricuspid valve regurgitation is moderate.   6. The aortic valve is tricuspid. There is mild calcification of the  aortic valve. There is mild thickening of the aortic valve. Aortic valve  regurgitation is not visualized. Aortic valve sclerosis is present, with  no evidence of aortic valve stenosis.  Aortic valve mean gradient measures 3.0 mmHg. Aortic valve Vmax measures  1.05 m/s.   7. The inferior vena cava is normal in size with greater than 50%  respiratory variability, suggesting right atrial pressure of 3 mmHg.    CHA2DS2-VASc Score = 7  The patient's score is based upon: CHF History: 0 HTN History: 1 Diabetes History: 0 Stroke History: 2 Vascular Disease History: 1 (aortic atherosclerosis) Age Score: 2 Gender Score: 1       ASSESSMENT AND PLAN: Paroxysmal  Atrial Fibrillation (ICD10:  I48.0) The patient's CHA2DS2-VASc score is 7, indicating a 11.2% annual risk of stroke.   S/p afib ablation 2017 and 2018 Patient in SR today but having frequent palpitations and SOB with exertion. Kardia  strips personally reviewed which show SR with frequent PACs. Will have her wear a 2 week Zio monitor to assess arrhythmia burden. If she does have a higher burden of afib, can consider dofetilide or PFA.  Continue Multaq 400 mg BID Continue Toprol 25 mg BID Continue Xarelto 15 mg daily Continue diltiazem 30 mg PRN q 4 hours for heart racing  Secondary Hypercoagulable State (ICD10:  D68.69) The patient is at significant risk for stroke/thromboembolism based upon her CHA2DS2-VASc Score of 7.  Continue Rivaroxaban (Xarelto). No bleeding issues.   High Risk Medication Monitoring (ICD 10: Z79.899) Intervals on ECG acceptable for dronedarone monitoring.      OSA  Encouraged nightly CPAP Followed by [facility]  HTN Stable on current regimen   Follow up in the AF clinic in 4 weeks.    Jorja Loa PA-C [facility] [address] [place], [place] [address] [phone]

## 2024-02-06 ENCOUNTER — Telehealth: Payer: Self-pay

## 2024-02-06 NOTE — Telephone Encounter (Signed)
 LVM for pt to bring her Machine to her Appointment for tomorrow 02/07/2024.    Sending MyChart message

## 2024-02-07 ENCOUNTER — Telehealth: Payer: Self-pay

## 2024-02-07 ENCOUNTER — Ambulatory Visit: Payer: PPO | Admitting: Adult Health

## 2024-02-07 NOTE — Telephone Encounter (Signed)
 Called [facility] [phone]  and they stated that the pt's order was ready to be shipped but they needed her to call them to Verify her home address for her delivery. Told them that pt was on her way up there to get her machine, rep stated that she would have to check to see if they had a machine in their office, she put me on hold, then came back to the phone and said that the pt's order has expired and they would need a new order sent in as well as her office note from today. Told rep that pt didn't get seen today b/c today's appointment was for Initial CPAP F/U and b/c she never got her New Machine we had to reschedule her. Rep said they were going to send a new order form for [place] to fill out, and we needed to make a office note saying pt couldn't be seen today due to her not having her New Machine.

## 2024-02-08 NOTE — Telephone Encounter (Signed)
 I called [facility] yesterday and spoke to [facility].  It appears that on 2 occasions they did not communicate that they were unable to reach the patient.  Nor did they communicate that the order had expired.  So on 2 different occasions the patient's appointments have had be canceled at our office because she does not have a new machine yet.  My recommendation is if her insurance will allow her to go to another DME company I would suggest another DME company at this point.  Please call patient

## 2024-02-08 NOTE — Telephone Encounter (Signed)
 Sent [employer] to [employer]

## 2024-02-08 NOTE — Telephone Encounter (Signed)
 Called pt back per Tylene Fantasia, NP and asked her if she would like to switch DME companies and Pt stated that she would.   Sending a Risk manager to [employer] to see if they take [employer].

## 2024-02-09 DIAGNOSIS — Z7952 Long term (current) use of systemic steroids: Secondary | ICD-10-CM | POA: Diagnosis not present

## 2024-02-09 DIAGNOSIS — Z7901 Long term (current) use of anticoagulants: Secondary | ICD-10-CM | POA: Diagnosis not present

## 2024-02-09 DIAGNOSIS — K648 Other hemorrhoids: Secondary | ICD-10-CM | POA: Diagnosis not present

## 2024-02-09 DIAGNOSIS — I480 Paroxysmal atrial fibrillation: Secondary | ICD-10-CM | POA: Diagnosis not present

## 2024-02-09 DIAGNOSIS — K625 Hemorrhage of anus and rectum: Secondary | ICD-10-CM | POA: Diagnosis not present

## 2024-02-14 DIAGNOSIS — I480 Paroxysmal atrial fibrillation: Secondary | ICD-10-CM | POA: Diagnosis not present

## 2024-02-14 NOTE — Addendum Note (Signed)
 Encounter addended by: Tess Fife, RN on: 02/14/2024 9:51 AM  Actions taken: Imaging Exam ended

## 2024-02-23 ENCOUNTER — Telehealth: Payer: Self-pay | Admitting: Adult Health

## 2024-02-23 NOTE — Telephone Encounter (Signed)
 Pt states she has been told she will have to get a new CPAP, Pt is asking for a call to discuss why, when the one she has works perfectly fine.  Phone rep suggested to her that she calls the DME directly to discuss these concerns since that is where the new machine would come from.  Pt declines this and says she would like to speak with Jacqlyn Matas, NP about this 1st.

## 2024-02-26 NOTE — Telephone Encounter (Signed)
 Upon review of chart, in September she was seen here. We could not get a DL. She has an older machine (looks like setup 2015). We ordered a sleep study and later a new machine.

## 2024-02-26 NOTE — Telephone Encounter (Signed)
 I spoke with the patient.  She was concerned because she was not ready to get a new machine and she was told she would have $125 for supplies.  The patient says she has plenty of supplies at home.  I did explain to her that I found out from [facility] that with insurance, a sleep study is only good for 1 year after completion in order to get a new machine.  I also explained to her that we are unable to get a download at her last visit and we would need to be able to do the download to ensure her machine is adequately controlling her sleep apnea.  The patient is welcome to call her insurance company to find out exactly how much they cover towards machine and supplies and she can also speak with [facility] to discuss in more detail what the charges are for supplies.  Perhaps she has a deductible or co-pay she is responsible for?  The patient verbalized appreciation.  She will call [facility] to discuss the next steps on getting a new machine and exactly which supplies she may or may not need. Once she has a new machine, she needs a 30-90 day follow-up appt here. She has an appt for 05/28/24. Pt thanked me for the call.

## 2024-02-27 DIAGNOSIS — G4733 Obstructive sleep apnea (adult) (pediatric): Secondary | ICD-10-CM | POA: Diagnosis not present

## 2024-02-28 ENCOUNTER — Ambulatory Visit (HOSPITAL_COMMUNITY)
Admission: RE | Admit: 2024-02-28 | Discharge: 2024-02-28 | Disposition: A | Discharge status: Home / Self Care | Source: Ambulatory Visit | Attending: Physician Assistant | Admitting: Physician Assistant

## 2024-02-28 ENCOUNTER — Encounter (HOSPITAL_COMMUNITY): Payer: Self-pay | Admitting: Physician Assistant

## 2024-02-28 VITALS — BP 140/70 | HR 75 | Ht 62.5 in | Wt 136.0 lb

## 2024-02-28 DIAGNOSIS — I480 Paroxysmal atrial fibrillation: Secondary | ICD-10-CM

## 2024-02-28 DIAGNOSIS — D6869 Other thrombophilia: Secondary | ICD-10-CM | POA: Diagnosis not present

## 2024-02-28 DIAGNOSIS — Z79899 Other long term (current) drug therapy: Secondary | ICD-10-CM

## 2024-02-28 DIAGNOSIS — Z5181 Encounter for therapeutic drug level monitoring: Secondary | ICD-10-CM

## 2024-02-28 NOTE — Progress Notes (Signed)
 Primary Care Physician: Aldo Hun, MD Primary Cardiologist: Dorothye Gathers, MD  Electrophysiologist: Jolly Needle, MD (Inactive)  Referring Physician: Dr Marnette Sinner is a 83 y.o. female with a history of CVA, HLD, HTN, OSA, polymyalgia rheumatica, atrial fibrillation who presents for follow up in the [facility]. Patient is s/p two ablations 01/19/16 and 02/16/17. She was started on Multaq  05/2023. Patient is on Eliquis for stroke prevention.  Patient returns for follow up for atrial fibrillation and Multaq  monitoring. She remains in SR today. She wore a cardiac monitor which showed no afib, frequent PACs 12% burden. No bleeding issues on anticoagulation.   Today, she  denies symptoms of chest pain, shortness of breath, orthopnea, PND, lower extremity edema, dizziness, presyncope, syncope, bleeding, or neurologic sequela. The patient is tolerating medications without difficulties and is otherwise without complaint today.    ROS- All systems are reviewed and negative except as per the HPI above.   Atrial Fibrillation Risk Factors:  she does have symptoms or diagnosis of sleep apnea. she does not have a history of rheumatic fever.   Atrial Fibrillation Management history:  Previous antiarrhythmic drugs: amiodarone  (hair loss), flecainide  (dizziness, syncope) Previous cardioversions: 2013 x3, 2017 Previous ablations: 01/19/16, 02/16/17 Anticoagulation history: Xarelto , Eliquis   Physical Exam: BP (!) 140/70   Pulse 75   Ht 5' 2.5" (1.588 m)   Wt 61.7 kg   BMI 24.48 kg/m   GEN: Well nourished, well developed in no acute distress CARDIAC: Regular rate and rhythm, no murmurs, rubs, gallops RESPIRATORY:  Clear to auscultation without rales, wheezing or rhonchi  ABDOMEN: Soft, non-tender, non-distended EXTREMITIES:  No edema; No deformity    Wt Readings from Last 3 Encounters:  02/28/24 61.7 kg  01/24/24 61.1 kg  07/27/23 58.9 kg      EKG today demonstrates  SR, 1st degree AV block Vent. rate 75 BPM PR interval 242 ms QRS duration 66 ms QT/QTcB 412/460 ms   Echo 08/04/22 demonstrated   1. Left ventricular ejection fraction, by estimation, is 60 to 65%. The  left ventricle has normal function. The left ventricle has no regional  wall motion abnormalities. Left ventricular diastolic parameters are  indeterminate.   2. Right ventricular systolic function is normal. The right ventricular  size is normal. There is mildly elevated pulmonary artery systolic  pressure. The estimated right ventricular systolic pressure is 44.0 mmHg.   3. Left atrial size was moderately dilated.   4. The mitral valve is normal in structure. Mild to moderate mitral valve  regurgitation. No evidence of mitral stenosis.   5. Tricuspid valve regurgitation is moderate.   6. The aortic valve is tricuspid. There is mild calcification of the  aortic valve. There is mild thickening of the aortic valve. Aortic valve  regurgitation is not visualized. Aortic valve sclerosis is present, with  no evidence of aortic valve stenosis.  Aortic valve mean gradient measures 3.0 mmHg. Aortic valve Vmax measures  1.05 m/s.   7. The inferior vena cava is normal in size with greater than 50%  respiratory variability, suggesting right atrial pressure of 3 mmHg.    CHA2DS2-VASc Score = 7  The patient's score is based upon: CHF History: 0 HTN History: 1 Diabetes History: 0 Stroke History: 2 Vascular Disease History: 1 (aortic atherosclerosis) Age Score: 2 Gender Score: 1       ASSESSMENT AND PLAN: Paroxysmal Atrial Fibrillation (ICD10:  I48.0) The patient's CHA2DS2-VASc score is 7,  indicating a 11.2% annual risk of stroke.   Patient in SR today Zio monitor showed frequent PACs but no afib. Continue Multaq  400 mg BID Continue Toprol  25 mg daily Continue Eliquis 5 mg BID. Will need to watch weight closely for dose reduction.  Continue diltiazem  30 mg  PRN q 4 hours for heart racing  Secondary Hypercoagulable State (ICD10:  D68.69) The patient is at significant risk for stroke/thromboembolism based upon her CHA2DS2-VASc Score of 7.  Continue Rivaroxaban  (Xarelto ). No bleeding issues.   High Risk Medication Monitoring (ICD 10: Z79.899) Intervals on ECG acceptable for dronedarone  monitoring.      OSA  Encouraged nightly CPAP Followed by [facility]  HTN Stable on current regimen   Follow up in the AF clinic in 6 months.    Myrtha Ates PA-C [facility] [address] [place], [place] [address] [phone]

## 2024-03-28 DIAGNOSIS — G4733 Obstructive sleep apnea (adult) (pediatric): Secondary | ICD-10-CM | POA: Diagnosis not present

## 2024-03-29 ENCOUNTER — Encounter: Payer: Self-pay | Admitting: Adult Health

## 2024-04-28 DIAGNOSIS — G4733 Obstructive sleep apnea (adult) (pediatric): Secondary | ICD-10-CM | POA: Diagnosis not present

## 2024-05-28 ENCOUNTER — Ambulatory Visit: Admitting: Adult Health

## 2024-05-28 ENCOUNTER — Encounter: Payer: Self-pay | Admitting: Adult Health

## 2024-05-28 VITALS — BP 145/69 | HR 72 | Ht 61.0 in | Wt 136.0 lb

## 2024-05-28 DIAGNOSIS — G4733 Obstructive sleep apnea (adult) (pediatric): Secondary | ICD-10-CM | POA: Diagnosis not present

## 2024-05-28 DIAGNOSIS — G4489 Other headache syndrome: Secondary | ICD-10-CM

## 2024-05-28 NOTE — Patient Instructions (Signed)
 Your Plan:  Continue gabapentin  Continues on CPAP If pain around the ear continues and you develop any associated symptoms please let us  know.     Thank you for coming to see us  at [facility]. I hope we have been able to provide you high quality care today.  You may receive a patient satisfaction survey over the next few weeks. We would appreciate your feedback and comments so that we may continue to improve ourselves and the health of our patients.

## 2024-05-28 NOTE — Progress Notes (Signed)
 PATIENT: Lorraine Little DOB: [birth year]  REASON FOR VISIT: follow up HISTORY FROM: patient  Chief Complaint  Patient presents with   RM 19    Patient is here alone for initial autopap f/u. She denies any issues with her new machine. ESS 6.     HISTORY OF PRESENT ILLNESS: Today 05/28/24:  Lorraine Little is a 83 y.o. female with a history of OSA on CPAP. Returns today for follow-up.  Reports that CPAP is working well for her.  She denies any new symptoms.  New machine is working well.  In regards to her headaches they have been under relatively good control.  She states that these are not her typical migraine headaches.  They are much milder.  Headache is global.  No photophobia, phonophobia, nausea, numbness, weakness.  She continues on gabapentin  3 times a day.  When she has a headache on occasion she will take an extra gabapentin  and the headache resolves quickly.  She does state that she has had a different type of discomfort around the left ear.  She states that it normally starts around the left ear and radiates to the temporal region.  She states it is very brief and usually resolves without her taking anything.  Has occurred 2-3 times in the last month but she has not had any recent episodes.  She does note during 1 episode she had trouble walking a straight line down the hall.  She has not had any other symptoms again.  She returns today for an evaluation.       07/05/23: Lorraine Little is a 83 y.o. female with a history of OSA on CPAP and Headache. Returns today for follow-up.  Overall she is doing doing well.  Denies any new symptoms.  In regards to her headaches she continues on gabapentin  100 mg TID. Feels that her headaches are under relatively good control.  Has not really had a migraine since she has been on gabapentin . she returns today for an evaluation.  Patient has an older machine.  We were unable to get a download today.  She states that she is using it  nightly.   06/23/2022: Lorraine Little is an 83 year old female with a history of obstructive sleep apnea on CPAP and headaches.  She returns today for follow-up.  She did not bring her download with her.  She reports that CPAP is working well.  She will try to drop off her SD card by her office so we can look at a download.  In regards to her headaches she states they are pretty good. Takes gabapentin  300 mg TID. Seeing a holistic MD in [place]. Reports in August she had two episodes started while she  was sitting, started feeling weird and had trouble walking. Felt like she was walking on clouds. Only last 2-3 minutes.  Patient does not wish to have any further work-up as of now.  She plans to monitor symptoms.  06/21/21: Lorraine Little is an 83 year old female with a history of obstructive sleep apnea on CPAP and headaches.  She returns today for follow-up.  She reports that the CPAP is working well for her.  She denies any new symptoms.  Her download is below.  Patient reports that her headaches seem to be controlled with gabapentin .  She has approximately 1 headache a week.  She typically can take an extra dose of gabapentin  and the headache resolves fairly quickly.  She still occasionally will get the sharp  shooting pains on the left side originating from the neck up to the temporal region.  At the last visit her sedimentation rate and CRP was in normal range.  The patient patient also notices at times she feels a little off balance.  Denies any falls.  She returns today for an evaluation.    07/13/20: Lorraine Little is an 83 year old female with a history of obstructive sleep apnea on CPAP.  Her download indicates that she use her machine nightly for compliance of 100%.  She use her machine greater than 4 hours each night.  On average she uses her machine 7 hours and 47 minutes.  Her residual AHI is 0.4 on 5 to 10 cm of water with EPR of 1.  Leak in the 95th percentile is 38.9 L/min.    She reports that  she continues to take gabapentin  for headaches.  She states that she most recently had sharp shooting pains in the left temporal region.  This is not typical for her headaches.  She reports that it was very brief but severe.  Denies any visual changes.  Reports that this has happened in the past but does not happen very often.  She does have a history of PMR and is on prednisone  daily.  Reports that this is not currently being managed by a rheumatologist but rather her PCP.  HISTORY 02/24/20:   Lorraine Little is an 83 year old female with a history of obstructive sleep apnea on CPAP.  Her download indicates that she use her machine nightly for compliance of 100%.  She used her machine greater than 4 hours 29 out of 30 days for compliance of 97%.  She used her machine on average 7 hours and 50 minutes.  Her residual AHI is 0.5 on 5 to 10 cm of water with EPR of 1.  Leak in the 95th percentile is 34.8 L/min.   She continues to take gabapentin  for headaches.  She typically takes 300 mg daily on occasion she may take 400 if she has a headache.  She reports that she has been having sharp shooting pains behind the left ear.  These only last for seconds.  Reports that they do not occur consistently.    REVIEW OF SYSTEMS: Out of a complete 14 system review of symptoms, the patient complains only of the following symptoms, and all other reviewed systems are negative.  FSS 40 ESS 14  ALLERGIES: Allergies  Allergen Reactions   Ace Inhibitors     Other reaction(s): tickle cough in throat.   Estrogens     Other reaction(s): Unknown   Flecainide      Other reaction(s): blacked out and hurt her shoulder 1/13.   Pantoprazole  Sodium     Other reaction(s): Unknown    HOME MEDICATIONS: Outpatient Medications Prior to Visit  Medication Sig Dispense Refill   Coenzyme Q10 (CO Q-10) 300 MG CAPS Take 300 mg by mouth at bedtime.     dronedarone  (MULTAQ ) 400 MG tablet Take 1 tablet (400 mg total) by mouth 2 (two)  times daily with a meal. 60 tablet 6   ELIQUIS 5 MG TABS tablet Take 5 mg by mouth 2 (two) times daily.     FIBER PO Take 2 tablets by mouth at bedtime. Chewable     gabapentin  (NEURONTIN ) 100 MG capsule Take 1 capsule (100 mg total) by mouth 3 (three) times daily. 270 capsule 3   losartan  (COZAAR ) 100 MG tablet TAKE 1 TABLET BY MOUTH EVERY DAY 90 tablet  2   MAGNESIUM OXIDE PO Taking 1 capsule in the pm- Mag7 prescription     metoprolol  succinate (TOPROL -XL) 25 MG 24 hr tablet TAKE 1 TABLET (25 MG) BY MOUTH IN THE MORNING AND AT BEDTIME 180 tablet 2   Nutritional Supplements (HOMOCYSTEINE SUPPORT PO) Take 1 capsule by mouth at bedtime.     predniSONE  (DELTASONE ) 1 MG tablet Take 3 mg by mouth daily with breakfast.     rosuvastatin (CRESTOR) 10 MG tablet Take 10 mg by mouth daily.     No facility-administered medications prior to visit.    PAST MEDICAL HISTORY: Past Medical History:  Diagnosis Date   Adrenal insufficiency (HCC)    takes prednisone  5mg  daily   Cervical disc disease    CVA (cerebral infarction)    a. 11/2011   Depression    Diverticulosis    GERD (gastroesophageal reflux disease)    pt denies symptoms with this   Glaucoma    Hyperlipidemia    a. Diagnosed 12/2011   Hypertension    Insomnia with sleep apnea    Internal hemorrhoid    Migraine headache    Osteoarthritis    Osteoporosis    Persistent atrial fibrillation (HCC)    a.  Diagnosed 07/2011;  b. Normal nuclear myoview  07/2011 and normal EF at that time;  c. Most recent echo 2/20 /13 - EF 55-60% wit mild-mod MR.;  d. s/p DCCV 01/12/2012   Polymyalgia rheumatica (HCC)    a. On chronic steroids   Tubular adenoma of colon 04/2007    PAST SURGICAL HISTORY: Past Surgical History:  Procedure Laterality Date   ATRIAL FIBRILLATION ABLATION N/A 02/16/2017   Procedure: Atrial Fibrillation Ablation;  Surgeon: Lynwood Rakers, MD;  Location: [facility] INVASIVE CV LAB;  Service: Cardiovascular;  Laterality: N/A;   BREAST  LUMPECTOMY  1980's   right  x2 (benign)   CARDIOVERSION  01/12/2012   Procedure: CARDIOVERSION;  Surgeon: Redell GORMAN Shallow, MD;  Location: [facility] OR;  Service: Cardiovascular;  Laterality: N/A;   CARDIOVERSION  03/28/2012   Procedure: CARDIOVERSION;  Surgeon: Ezra GORMAN Shuck, MD;  Location: [facility] OR;  Service: Cardiovascular;  Laterality: N/A;   CARDIOVERSION  06/29/2012   Procedure: CARDIOVERSION;  Surgeon: Debby Gallop, MD;  Location: [facility] ENDOSCOPY;  Service: Cardiovascular;  Laterality: N/A;   CARDIOVERSION N/A 12/07/2015   Procedure: CARDIOVERSION;  Surgeon: Maude JAYSON Emmer, MD;  Location: [facility] ENDOSCOPY;  Service: Cardiovascular;  Laterality: N/A;   ELECTROPHYSIOLOGIC STUDY N/A 01/19/2016   AFib ablation by Dr Rakers   GLAUCOMA SURGERY     TEE WITHOUT CARDIOVERSION N/A 01/19/2016   Procedure: TRANSESOPHAGEAL ECHOCARDIOGRAM (TEE);  Surgeon: Aleene JINNY Passe, MD;  Location: [facility] ENDOSCOPY;  Service: Cardiovascular;  Laterality: N/A;   TONSILLECTOMY  age 68    FAMILY HISTORY: Family History  Problem Relation Age of Onset   Stroke Mother        Deceased @ 13   Heart failure Mother        died from   Liver cancer Father        Deceased @ 80   Colon cancer Paternal Grandmother    Sleep apnea Neg Hx     SOCIAL HISTORY: Social History   Socioeconomic History   Marital status: Married    Spouse name: Vaughan   Number of children: 3   Years of education: college   Highest education level: Not on file  Occupational History   Occupation: retired     Comment: Runner, broadcasting/film/video , [place]  Tobacco Use   Smoking status: Never   Smokeless tobacco: Never   Tobacco comments:    Never smoked 07/27/23  Vaping Use   Vaping status: Never Used  Substance and Sexual Activity   Alcohol  use: Yes    Comment: rare   Drug use: No   Sexual activity: Not on file  Other Topics Concern   Not on file  Social History Narrative   Pt lives in [place] with spouse.  [place])  She was previously a Engineer, site.     Caffeine- None   Right handed   Patient has three children.   Patient has a college education.   Social Drivers of Corporate investment banker Strain: Not on file  Food Insecurity: Not on file  Transportation Needs: Not on file  Physical Activity: Not on file  Stress: Not on file  Social Connections: Not on file  Intimate Partner Violence: Not on file      PHYSICAL EXAM  Vitals:   05/28/24 1103  BP: (!) 145/69  Pulse: 72  Weight: 136 lb (61.7 kg)  Height: 5' 1 (1.549 m)      Body mass index is 25.7 kg/m.  Generalized: Well developed, in no acute distress    Neurological examination  Mentation: Alert oriented to time, place, history taking. Follows all commands speech and language fluent Cranial nerve II-XII: Extraocular movements were full, visual field were full on confrontational test Head turning and shoulder shrug  were normal and symmetric.  Facial symmetry noted  DIAGNOSTIC DATA (LABS, IMAGING, TESTING) - I reviewed patient records, labs, notes, testing and imaging myself where available.  Lab Results  Component Value Date   WBC 6.7 07/27/2023   HGB 12.3 07/27/2023   HCT 39.7 07/27/2023   MCV 96.6 07/27/2023   PLT 220 07/27/2023      Component Value Date/Time   NA 136 07/27/2023 1116   NA 142 03/16/2020 1032   K 4.3 07/27/2023 1116   CL 103 07/27/2023 1116   CO2 25 07/27/2023 1116   GLUCOSE 90 07/27/2023 1116   BUN 12 07/27/2023 1116   BUN 15 03/16/2020 1032   CREATININE 1.08 (H) 07/27/2023 1116   CREATININE 1.15 (H) 01/12/2016 1247   CALCIUM  8.5 (L) 07/27/2023 1116   PROT 6.3 (L) 08/04/2015 1415   ALBUMIN  3.9 08/04/2015 1415   AST 34 08/04/2015 1415   ALT 23 08/04/2015 1415   ALKPHOS 72 08/04/2015 1415   BILITOT 0.9 08/04/2015 1415   GFRNONAA 51 (L) 07/27/2023 1116   GFRAA 68 03/16/2020 1032   Lab Results  Component Value Date   CHOL 146 08/21/2018   HDL 86 08/21/2018   LDLCALC 47 08/21/2018   TRIG 65 08/21/2018   CHOLHDL 1.7  08/21/2018   Lab Results  Component Value Date   HGBA1C 5.8 (H) 12/21/2011   No results found for: [id number] Lab Results  Component Value Date   TSH 0.302 (L) 06/23/2016      ASSESSMENT AND PLAN 83 y.o. year old female  has a past medical history of Adrenal insufficiency (HCC), Cervical disc disease, CVA (cerebral infarction), Depression, Diverticulosis, GERD (gastroesophageal reflux disease), Glaucoma, Hyperlipidemia, Hypertension, Insomnia with sleep apnea, Internal hemorrhoid, Migraine headache, Osteoarthritis, Osteoporosis, Persistent atrial fibrillation (HCC), Polymyalgia rheumatica (HCC), and Tubular adenoma of colon (04/2007). here with:  OSA on CPAP  -CPAP compliance is excellent - Residual AHI in normal range  2. Headaches  - Continue gabapentin  100 mg 3 times daily. - Discussed potentially doing  imaging due to new symptoms regarding pain around the ear.  Patient deferred for now.  She states that if it continues to happen or she notices worsening symptoms she will let us  know -I did advise that if she continues to have this discomfort around the ear with any associated symptoms that is concerning she should let us  know.   - F/U in 1 year or sooner if needed    Duwaine Russell, MSN, NP-C 05/28/2024, 11:02 AM [facility] [address] [place], [place] [address] [phone]  The patient's condition requires frequent monitoring and adjustments in the treatment plan, reflecting the ongoing complexity of care.  This provider is the continuing focal point for all needed services for this condition.

## 2024-06-28 DIAGNOSIS — G4733 Obstructive sleep apnea (adult) (pediatric): Secondary | ICD-10-CM | POA: Diagnosis not present

## 2024-07-03 DIAGNOSIS — Z1231 Encounter for screening mammogram for malignant neoplasm of breast: Secondary | ICD-10-CM | POA: Diagnosis not present

## 2024-07-04 ENCOUNTER — Ambulatory Visit: Payer: PPO | Admitting: Adult Health

## 2024-07-04 VITALS — BP 130/60 | HR 70 | Ht 62.0 in | Wt 140.0 lb

## 2024-07-04 DIAGNOSIS — G4489 Other headache syndrome: Secondary | ICD-10-CM

## 2024-07-04 DIAGNOSIS — R202 Paresthesia of skin: Secondary | ICD-10-CM

## 2024-07-04 NOTE — Progress Notes (Signed)
 Thank you for the detailed note and work up plan for our mutual patient:  I suspect she may have gout, given that the big toe is the center of pain.  Can we add a uric acid to the battery of tests ?   If not, let Dr Shayne know that this should be done.  Agree fully with your plan of action.,   Dedra Gores, MD

## 2024-07-04 NOTE — Progress Notes (Signed)
 PATIENT: Lorraine Little DOB: 03-Aug-1941  REASON FOR VISIT: follow up HISTORY FROM: patient  Chief Complaint  Patient presents with   Follow-up    RM 19, alone. Autopap going well. Headaches are improved. C/o  sharp pain in feet most nights.     HISTORY OF PRESENT ILLNESS: Today 07/04/24:  Lorraine Little is a 83 y.o. female with a history of obstructive sleep apnea on CPAP and headaches.  Patient returns today to discuss pain in her feet.  This was a new issue.  She states that she has been having discomfort in her feet for quite some time now she reports that last night she barely slept because of pain in the right big toe.  She states it was sharp intense pain that would come and go all night.  She has it in the left foot as well but more so in the right.  Not diabetic.  She has mild discomfort that she describes as numbness and tingling in the feet during the day more intense at night.  She states that she has a hard time wearing closed shoes because it makes her toes hurt.  She reports that her headaches have been under good control with gabapentin .  She states that she would get sharp shooting pains around the left ear.  But with gabapentin  those have been controlled.  She returns today for an evaluation       05/28/24: Lorraine Little is a 83 y.o. female with a history of OSA on CPAP. Returns today for follow-up.  Reports that CPAP is working well for her.  She denies any new symptoms.  New machine is working well.  In regards to her headaches they have been under relatively good control.  She states that these are not her typical migraine headaches.  They are much milder.  Headache is global.  No photophobia, phonophobia, nausea, numbness, weakness.  She continues on gabapentin  3 times a day.  When she has a headache on occasion she will take an extra gabapentin  and the headache resolves quickly.  She does state that she has had a different type of discomfort around the left ear.   She states that it normally starts around the left ear and radiates to the temporal region.  She states it is very brief and usually resolves without her taking anything.  Has occurred 2-3 times in the last month but she has not had any recent episodes.  She does note during 1 episode she had trouble walking a straight line down the hall.  She has not had any other symptoms again.  She returns today for an evaluation.       07/05/23: Lorraine Little is a 83 y.o. female with a history of OSA on CPAP and Headache. Returns today for follow-up.  Overall she is doing doing well.  Denies any new symptoms.  In regards to her headaches she continues on gabapentin  100 mg TID. Feels that her headaches are under relatively good control.  Has not really had a migraine since she has been on gabapentin . she returns today for an evaluation.  Patient has an older machine.  We were unable to get a download today.  She states that she is using it nightly.   06/23/2022: Lorraine Little is an 83 year old female with a history of obstructive sleep apnea on CPAP and headaches.  She returns today for follow-up.  She did not bring her download with her.  She reports that  CPAP is working well.  She will try to drop off her SD card by her office so we can look at a download.  In regards to her headaches she states they are pretty good. Takes gabapentin  300 mg TID. Seeing a holistic MD in [place]. Reports in August she had two episodes started while she  was sitting, started feeling weird and had trouble walking. Felt like she was walking on clouds. Only last 2-3 minutes.  Patient does not wish to have any further work-up as of now.  She plans to monitor symptoms.  06/21/21: Lorraine Little is an 83 year old female with a history of obstructive sleep apnea on CPAP and headaches.  She returns today for follow-up.  She reports that the CPAP is working well for her.  She denies any new symptoms.  Her download is below.  Patient  reports that her headaches seem to be controlled with gabapentin .  She has approximately 1 headache a week.  She typically can take an extra dose of gabapentin  and the headache resolves fairly quickly.  She still occasionally will get the sharp shooting pains on the left side originating from the neck up to the temporal region.  At the last visit her sedimentation rate and CRP was in normal range.  The patient patient also notices at times she feels a little off balance.  Denies any falls.  She returns today for an evaluation.    07/13/20: Lorraine Little is an 83 year old female with a history of obstructive sleep apnea on CPAP.  Her download indicates that she use her machine nightly for compliance of 100%.  She use her machine greater than 4 hours each night.  On average she uses her machine 7 hours and 47 minutes.  Her residual AHI is 0.4 on 5 to 10 cm of water with EPR of 1.  Leak in the 95th percentile is 38.9 L/min.    She reports that she continues to take gabapentin  for headaches.  She states that she most recently had sharp shooting pains in the left temporal region.  This is not typical for her headaches.  She reports that it was very brief but severe.  Denies any visual changes.  Reports that this has happened in the past but does not happen very often.  She does have a history of PMR and is on prednisone  daily.  Reports that this is not currently being managed by a rheumatologist but rather her PCP.  HISTORY 02/24/20:   Lorraine Little is an 83 year old female with a history of obstructive sleep apnea on CPAP.  Her download indicates that she use her machine nightly for compliance of 100%.  She used her machine greater than 4 hours 29 out of 30 days for compliance of 97%.  She used her machine on average 7 hours and 50 minutes.  Her residual AHI is 0.5 on 5 to 10 cm of water with EPR of 1.  Leak in the 95th percentile is 34.8 L/min.   She continues to take gabapentin  for headaches.  She typically takes  300 mg daily on occasion she may take 400 if she has a headache.  She reports that she has been having sharp shooting pains behind the left ear.  These only last for seconds.  Reports that they do not occur consistently.    REVIEW OF SYSTEMS: Out of a complete 14 system review of symptoms, the patient complains only of the following symptoms, and all other reviewed systems are negative.  FSS 40 ESS 14  ALLERGIES: Allergies  Allergen Reactions   Ace Inhibitors     Other reaction(s): tickle cough in throat.   Estrogens     Other reaction(s): Unknown   Flecainide      Other reaction(s): blacked out and hurt her shoulder 1/13.   Pantoprazole  Sodium     Other reaction(s): Unknown    HOME MEDICATIONS: Outpatient Medications Prior to Visit  Medication Sig Dispense Refill   Coenzyme Q10 (CO Q-10) 300 MG CAPS Take 300 mg by mouth at bedtime.     dronedarone  (MULTAQ ) 400 MG tablet Take 1 tablet (400 mg total) by mouth 2 (two) times daily with a meal. 60 tablet 6   ELIQUIS 5 MG TABS tablet Take 5 mg by mouth 2 (two) times daily.     FIBER PO Take 2 tablets by mouth at bedtime. Chewable     gabapentin  (NEURONTIN ) 100 MG capsule Take 1 capsule (100 mg total) by mouth 3 (three) times daily. 270 capsule 3   losartan  (COZAAR ) 100 MG tablet TAKE 1 TABLET BY MOUTH EVERY DAY 90 tablet 2   metoprolol  succinate (TOPROL -XL) 25 MG 24 hr tablet TAKE 1 TABLET (25 MG) BY MOUTH IN THE MORNING AND AT BEDTIME 180 tablet 2   predniSONE  (DELTASONE ) 1 MG tablet Take 3 mg by mouth daily with breakfast.     rosuvastatin (CRESTOR) 10 MG tablet Take 10 mg by mouth daily.     flecainide  (TAMBOCOR ) 100 MG tablet Take 100mg  1 tablet prn onset of atrial fib. 30 tablet 6   MAGNESIUM OXIDE PO Taking 1 capsule in the pm- Mag7 prescription     Nutritional Supplements (HOMOCYSTEINE SUPPORT PO) Take 1 capsule by mouth at bedtime.     No facility-administered medications prior to visit.    PAST MEDICAL HISTORY: Past  Medical History:  Diagnosis Date   Adrenal insufficiency (HCC)    takes prednisone  5mg  daily   Cervical disc disease    CVA (cerebral infarction)    a. 11/2011   Depression    Diverticulosis    GERD (gastroesophageal reflux disease)    pt denies symptoms with this   Glaucoma    Hyperlipidemia    a. Diagnosed 12/2011   Hypertension    Insomnia with sleep apnea    Internal hemorrhoid    Migraine headache    Osteoarthritis    Osteoporosis    Persistent atrial fibrillation (HCC)    a.  Diagnosed 07/2011;  b. Normal nuclear myoview  07/2011 and normal EF at that time;  c. Most recent echo 2/20 /13 - EF 55-60% wit mild-mod MR.;  d. s/p DCCV 01/12/2012   Polymyalgia rheumatica (HCC)    a. On chronic steroids   Tubular adenoma of colon 04/2007    PAST SURGICAL HISTORY: Past Surgical History:  Procedure Laterality Date   ATRIAL FIBRILLATION ABLATION N/A 02/16/2017   Procedure: Atrial Fibrillation Ablation;  Surgeon: Lynwood Rakers, MD;  Location[facility] INVASIVE CV LAB;  Service: Cardiovascular;  Laterality: N/A;   BREAST LUMPECTOMY  1980's   right  x2 (benign)   CARDIOVERSION  01/12/2012   Procedure: CARDIOVERSION;  Surgeon: Redell GORMAN Shallow, MD;  Location: [facility] OR;  Service: Cardiovascular;  Laterality: N/A;   CARDIOVERSION  03/28/2012   Procedure: CARDIOVERSION;  Surgeon: Ezra GORMAN Shuck, MD;  Location: [facility] OR;  Service: Cardiovascular;  Laterality: N/A;   CARDIOVERSION  06/29/2012   Procedure: CARDIOVERSION;  Surgeon: Debby Gallop, MD;  Location: [facility] ENDOSCOPY;  Service: Cardiovascular;  Laterality: N/A;   CARDIOVERSION N/A 12/07/2015   Procedure: CARDIOVERSION;  Surgeon: Maude JAYSON Emmer, MD;  Location: [facility] ENDOSCOPY;  Service: Cardiovascular;  Laterality: N/A;   ELECTROPHYSIOLOGIC STUDY N/A 01/19/2016   AFib ablation by Dr Kelsie   GLAUCOMA SURGERY     TEE WITHOUT CARDIOVERSION N/A 01/19/2016   Procedure: TRANSESOPHAGEAL ECHOCARDIOGRAM (TEE);  Surgeon: Aleene JINNY Passe, MD;  Location: [facility] ENDOSCOPY;   Service: Cardiovascular;  Laterality: N/A;   TONSILLECTOMY  age 1    FAMILY HISTORY: Family History  Problem Relation Age of Onset   Stroke Mother        Deceased @ 84   Heart failure Mother        died from   Liver cancer Father        Deceased @ 25   Colon cancer Paternal Grandmother    Sleep apnea Neg Hx     SOCIAL HISTORY: Social History   Socioeconomic History   Marital status: Married    Spouse name: Dwight   Number of children: 3   Years of education: college   Highest education level: Not on file  Occupational History   Occupation: retired     Comment: Runner, broadcasting/film/video , [place]  Tobacco Use   Smoking status: Never   Smokeless tobacco: Never   Tobacco comments:    Never smoked 07/27/23  Vaping Use   Vaping status: Never Used  Substance and Sexual Activity   Alcohol  use: Yes    Comment: rare   Drug use: No   Sexual activity: Not on file  Other Topics Concern   Not on file  Social History Narrative   Pt lives in [place] with spouse.  [place])  She was previously a Engineer, site.    Caffeine- None   Right handed   Patient has three children.   Patient has a college education.   Social Drivers of Corporate investment banker Strain: Not on file  Food Insecurity: Not on file  Transportation Needs: Not on file  Physical Activity: Not on file  Stress: Not on file  Social Connections: Not on file  Intimate Partner Violence: Not on file      PHYSICAL EXAM  Vitals:   07/04/24 1006  BP: 130/60  Pulse: 70  Weight: 140 lb (63.5 kg)  Height: 5' 2 (1.575 m)      Body mass index is 25.61 kg/m.  Generalized: Well developed, in no acute distress   Neurological examination  Mentation: Alert oriented to time, place, history taking. Follows all commands speech and language fluent Cranial nerve II-XII: .Pupils were equal round reactive to light extraocular movements were full, visual field were full on confrontational test. Facial sensation and strength were  normal.   Head turning and shoulder shrug  were normal and symmetric.. Motor: The motor testing reveals 5 over 5 strength of all 4 extremities. Good symmetric motor tone is noted throughout.  Sensory: Sensory decrease pinprick and vibration sensation in the lower extremities in a stocking-like pattern right greater than left.  Coordination: Cerebellar testing reveals good finger-nose-finger and heel-to-shin bilaterally.  Gait and station: Gait is normal.   DIAGNOSTIC DATA (LABS, IMAGING, TESTING) - I reviewed patient records, labs, notes, testing and imaging myself where available.  Lab Results  Component Value Date   WBC 6.7 07/27/2023   HGB 12.3 07/27/2023   HCT 39.7 07/27/2023   MCV 96.6 07/27/2023   PLT 220 07/27/2023      Component Value Date/Time   NA  136 07/27/2023 1116   NA 142 03/16/2020 1032   K 4.3 07/27/2023 1116   CL 103 07/27/2023 1116   CO2 25 07/27/2023 1116   GLUCOSE 90 07/27/2023 1116   BUN 12 07/27/2023 1116   BUN 15 03/16/2020 1032   CREATININE 1.08 (H) 07/27/2023 1116   CREATININE 1.15 (H) 01/12/2016 1247   CALCIUM  8.5 (L) 07/27/2023 1116   PROT 6.3 (L) 08/04/2015 1415   ALBUMIN  3.9 08/04/2015 1415   AST 34 08/04/2015 1415   ALT 23 08/04/2015 1415   ALKPHOS 72 08/04/2015 1415   BILITOT 0.9 08/04/2015 1415   GFRNONAA 51 (L) 07/27/2023 1116   GFRAA 68 03/16/2020 1032   Lab Results  Component Value Date   CHOL 146 08/21/2018   HDL 86 08/21/2018   LDLCALC 47 08/21/2018   TRIG 65 08/21/2018   CHOLHDL 1.7 08/21/2018   Lab Results  Component Value Date   HGBA1C 5.8 (H) 12/21/2011   No results found for: [id number] Lab Results  Component Value Date   TSH 0.302 (L) 06/23/2016      ASSESSMENT AND PLAN 83 y.o. year old female  has a past medical history of Adrenal insufficiency (HCC), Cervical disc disease, CVA (cerebral infarction), Depression, Diverticulosis, GERD (gastroesophageal reflux disease), Glaucoma, Hyperlipidemia, Hypertension,  Insomnia with sleep apnea, Internal hemorrhoid, Migraine headache, Osteoarthritis, Osteoporosis, Persistent atrial fibrillation (HCC), Polymyalgia rheumatica (HCC), and Tubular adenoma of colon (04/2007). here with:  Paresthesias in the lower extremities  - Blood work today, neuropathy panel - Nerve conduction studies with EMG on both lower extremities - Advised that I would discuss workup with Dr. Chalice as this is a new issue  2. Headaches  - Continue gabapentin  100 mg 3 times daily. - Patient inquiring about further workup if needed -we did discussed imaging again for headaches but she deferred.   Follow-up in 3 to 4 months with Dr. Chalice Duwaine Russell, MSN, NP-C 07/04/2024, 10:26 AM [facility] [address] [place], [place] [address] [phone]  The patient's condition requires frequent monitoring and adjustments in the treatment plan, reflecting the ongoing complexity of care.  This provider is the continuing focal point for all needed services for this condition.

## 2024-07-04 NOTE — Progress Notes (Signed)
ESS=6

## 2024-07-04 NOTE — Patient Instructions (Signed)
 Your Plan:  Continue gabapentin  for headaches Blood work today Nerve conduction study with EMG ordered     Thank you for coming to see us  at [facility]. I hope we have been able to provide you high quality care today.  You may receive a patient satisfaction survey over the next few weeks. We would appreciate your feedback and comments so that we may continue to improve ourselves and the health of our patients.

## 2024-07-09 LAB — CBC WITH DIFFERENTIAL/PLATELET
Basophils Absolute: 0.1 x10E3/uL (ref 0.0–0.2)
Basos: 1 %
EOS (ABSOLUTE): 0.1 x10E3/uL (ref 0.0–0.4)
Eos: 2 %
Hematocrit: 39.3 % (ref 34.0–46.6)
Hemoglobin: 12.4 g/dL (ref 11.1–15.9)
Immature Grans (Abs): 0.1 x10E3/uL (ref 0.0–0.1)
Immature Granulocytes: 1 %
Lymphocytes Absolute: 0.9 x10E3/uL (ref 0.7–3.1)
Lymphs: 12 %
MCH: 30.4 pg (ref 26.6–33.0)
MCHC: 31.6 g/dL (ref 31.5–35.7)
MCV: 96 fL (ref 79–97)
Monocytes Absolute: 0.8 x10E3/uL (ref 0.1–0.9)
Monocytes: 11 %
Neutrophils Absolute: 5.5 x10E3/uL (ref 1.4–7.0)
Neutrophils: 73 %
Platelets: 230 x10E3/uL (ref 150–450)
RBC: 4.08 x10E6/uL (ref 3.77–5.28)
RDW: 13.2 % (ref 11.7–15.4)
WBC: 7.5 x10E3/uL (ref 3.4–10.8)

## 2024-07-09 LAB — ENA+DNA/DS+ANTICH+CENTRO+FA...
Anti JO-1: <0.2 AI (ref 0.0–0.9)
Antiribosomal P Antibodies: <0.2 AI (ref 0.0–0.9)
Centromere Ab Screen: <0.2 AI (ref 0.0–0.9)
Chromatin Ab SerPl-aCnc: <0.2 AI (ref 0.0–0.9)
ENA RNP Ab: <0.2 AI (ref 0.0–0.9)
ENA SM Ab Ser-aCnc: <0.2 AI (ref 0.0–0.9)
Homogeneous Pattern: 1:640 {titer} — ABNORMAL HIGH
Scleroderma (Scl-70) (ENA) Antibody, IgG: <0.2 AI (ref 0.0–0.9)
Smith/RNP Antibodies: <0.2 AI (ref 0.0–0.9)
dsDNA Ab: 2 [IU]/mL (ref 0–9)

## 2024-07-09 LAB — MULTIPLE MYELOMA PANEL, SERUM
Albumin SerPl Elph-Mcnc: 3.6 g/dL (ref 2.9–4.4)
Albumin/Glob SerPl: 1.3 (ref 0.7–1.7)
Alpha 1: 0.3 g/dL (ref 0.0–0.4)
Alpha2 Glob SerPl Elph-Mcnc: 0.9 g/dL (ref 0.4–1.0)
B-Globulin SerPl Elph-Mcnc: 1 g/dL (ref 0.7–1.3)
Gamma Glob SerPl Elph-Mcnc: 0.7 g/dL (ref 0.4–1.8)
Globulin, Total: 3 g/dL (ref 2.2–3.9)
IgA/Immunoglobulin A, Serum: 324 mg/dL (ref 64–422)
IgG (Immunoglobin G), Serum: 881 mg/dL (ref 586–1602)
IgM (Immunoglobulin M), Srm: 47 mg/dL (ref 26–217)

## 2024-07-09 LAB — COMPREHENSIVE METABOLIC PANEL WITH GFR
ALT: 9 IU/L (ref 0–32)
AST: 19 IU/L (ref 0–40)
Albumin: 4.2 g/dL (ref 3.7–4.7)
Alkaline Phosphatase: 64 IU/L (ref 44–121)
BUN/Creatinine Ratio: 12 (ref 12–28)
BUN: 12 mg/dL (ref 8–27)
Bilirubin Total: 0.5 mg/dL (ref 0.0–1.2)
CO2: 25 mmol/L (ref 20–29)
Calcium: 9.2 mg/dL (ref 8.7–10.3)
Chloride: 105 mmol/L (ref 96–106)
Creatinine, Ser: 0.98 mg/dL (ref 0.57–1.00)
Globulin, Total: 2.4 g/dL (ref 1.5–4.5)
Glucose: 73 mg/dL (ref 70–99)
Potassium: 4.3 mmol/L (ref 3.5–5.2)
Sodium: 144 mmol/L (ref 134–144)
Total Protein: 6.6 g/dL (ref 6.0–8.5)
eGFR: 57 mL/min/1.73 — ABNORMAL LOW (ref >59)

## 2024-07-09 LAB — TSH: TSH: 1.57 u[IU]/mL (ref 0.450–4.500)

## 2024-07-09 LAB — SJOGREN'S SYNDROME ANTIBODS(SSA + SSB)
ENA SSA (RO) Ab: <0.2 AI (ref 0.0–0.9)
ENA SSB (LA) Ab: <0.2 AI (ref 0.0–0.9)

## 2024-07-09 LAB — HOMOCYSTEINE: Homocysteine: 13.3 umol/L (ref 0.0–21.3)

## 2024-07-09 LAB — C-REACTIVE PROTEIN: CRP: <1 mg/L (ref 0–10)

## 2024-07-09 LAB — HEMOGLOBIN A1C
Est. average glucose Bld gHb Est-mCnc: 117 mg/dL
Hgb A1c MFr Bld: 5.7 % — ABNORMAL HIGH (ref 4.8–5.6)

## 2024-07-09 LAB — ANA W/REFLEX: ANA Titer 1: POSITIVE — AB

## 2024-07-09 LAB — VITAMIN B12: Vitamin B-12: 395 pg/mL (ref 232–1245)

## 2024-07-09 LAB — METHYLMALONIC ACID, SERUM: Methylmalonic Acid: 208 nmol/L (ref 0–378)

## 2024-07-09 LAB — SEDIMENTATION RATE: Sed Rate: 20 mm/h (ref 0–40)

## 2024-07-10 ENCOUNTER — Ambulatory Visit: Payer: Self-pay | Admitting: Adult Health

## 2024-07-10 NOTE — Telephone Encounter (Signed)
I called patient LVM 

## 2024-07-11 NOTE — Telephone Encounter (Signed)
 I reviewed blood work results with the patient.  Advised that ANA was positive and homogeneous pattern was positive.  I discussed results with Dr. Chalice as well.  She advised that the patient should discuss with Dr. Mathews and he can refer to rheumatology if he feels necessary.  I relayed this information to the patient and she voiced understanding.

## 2024-07-11 NOTE — Telephone Encounter (Signed)
 Pt returned call. Please call back when available.

## 2024-07-12 ENCOUNTER — Other Ambulatory Visit (HOSPITAL_COMMUNITY): Payer: Self-pay | Admitting: Physician Assistant

## 2024-07-12 DIAGNOSIS — I480 Paroxysmal atrial fibrillation: Secondary | ICD-10-CM | POA: Diagnosis not present

## 2024-07-12 DIAGNOSIS — G4733 Obstructive sleep apnea (adult) (pediatric): Secondary | ICD-10-CM | POA: Diagnosis not present

## 2024-07-12 DIAGNOSIS — I10 Essential (primary) hypertension: Secondary | ICD-10-CM | POA: Diagnosis not present

## 2024-07-12 DIAGNOSIS — M353 Polymyalgia rheumatica: Secondary | ICD-10-CM | POA: Diagnosis not present

## 2024-07-12 DIAGNOSIS — Z7952 Long term (current) use of systemic steroids: Secondary | ICD-10-CM | POA: Diagnosis not present

## 2024-07-12 DIAGNOSIS — E785 Hyperlipidemia, unspecified: Secondary | ICD-10-CM | POA: Diagnosis not present

## 2024-07-12 DIAGNOSIS — D126 Benign neoplasm of colon, unspecified: Secondary | ICD-10-CM | POA: Diagnosis not present

## 2024-07-12 DIAGNOSIS — R946 Abnormal results of thyroid function studies: Secondary | ICD-10-CM | POA: Diagnosis not present

## 2024-07-12 DIAGNOSIS — H409 Unspecified glaucoma: Secondary | ICD-10-CM | POA: Diagnosis not present

## 2024-07-12 DIAGNOSIS — Z23 Encounter for immunization: Secondary | ICD-10-CM | POA: Diagnosis not present

## 2024-07-12 DIAGNOSIS — D6869 Other thrombophilia: Secondary | ICD-10-CM | POA: Diagnosis not present

## 2024-07-12 DIAGNOSIS — G43909 Migraine, unspecified, not intractable, without status migrainosus: Secondary | ICD-10-CM | POA: Diagnosis not present

## 2024-07-12 DIAGNOSIS — K648 Other hemorrhoids: Secondary | ICD-10-CM | POA: Diagnosis not present

## 2024-07-17 ENCOUNTER — Other Ambulatory Visit (HOSPITAL_COMMUNITY): Payer: Self-pay | Admitting: Internal Medicine

## 2024-07-17 ENCOUNTER — Telehealth (HOSPITAL_COMMUNITY): Payer: Self-pay

## 2024-07-17 DIAGNOSIS — M810 Age-related osteoporosis without current pathological fracture: Secondary | ICD-10-CM | POA: Insufficient documentation

## 2024-07-17 NOTE — Telephone Encounter (Signed)
 Auth Submission: NO AUTH NEEDED Site of care: Site of care: [facility] Payer: [employer] Medication & CPT/J Code(s) submitted: Prolia  (Denosumab ) [id number] Diagnosis Code: M81.0 Route of submission (phone, fax, portal):  Phone # Fax # Auth type: Buy/Bill HB Units/visits requested: 60mg  q36months x 2 doses Reference number:  Approval from: 07/17/24 to 10/30/24

## 2024-07-23 ENCOUNTER — Ambulatory Visit (HOSPITAL_COMMUNITY)
Admission: RE | Admit: 2024-07-23 | Discharge: 2024-07-23 | Disposition: A | Discharge status: Home / Self Care | Source: Ambulatory Visit | Attending: Internal Medicine | Admitting: Internal Medicine

## 2024-07-23 VITALS — BP 144/68 | HR 78 | Temp 97.5°F | Resp 17

## 2024-07-23 DIAGNOSIS — M810 Age-related osteoporosis without current pathological fracture: Secondary | ICD-10-CM | POA: Diagnosis not present

## 2024-07-23 MED ORDER — DENOSUMAB 60 MG/ML ~~LOC~~ SOSY
PREFILLED_SYRINGE | SUBCUTANEOUS | Status: AC
  Filled 2024-07-23: qty 1

## 2024-07-23 MED ORDER — DENOSUMAB 60 MG/ML ~~LOC~~ SOSY
60.0000 mg | PREFILLED_SYRINGE | Freq: Once | SUBCUTANEOUS | Status: AC
  Administered 2024-07-23: 60 mg via SUBCUTANEOUS

## 2024-07-29 DIAGNOSIS — G4733 Obstructive sleep apnea (adult) (pediatric): Secondary | ICD-10-CM | POA: Diagnosis not present

## 2024-08-15 ENCOUNTER — Other Ambulatory Visit (HOSPITAL_COMMUNITY): Payer: Self-pay | Admitting: Physician Assistant

## 2024-08-23 ENCOUNTER — Other Ambulatory Visit (HOSPITAL_COMMUNITY): Payer: Self-pay

## 2024-08-23 MED ORDER — MULTAQ 400 MG PO TABS: 400.0000 mg | ORAL_TABLET | Freq: Two times a day (BID) | ORAL | Status: AC

## 2024-08-29 ENCOUNTER — Ambulatory Visit (HOSPITAL_COMMUNITY)
Admission: RE | Admit: 2024-08-29 | Discharge: 2024-08-29 | Disposition: A | Discharge status: Home / Self Care | Source: Ambulatory Visit | Attending: Physician Assistant | Admitting: Physician Assistant

## 2024-08-29 VITALS — BP 152/72 | HR 71 | Ht 62.0 in | Wt 136.2 lb

## 2024-08-29 DIAGNOSIS — Z79899 Other long term (current) drug therapy: Secondary | ICD-10-CM

## 2024-08-29 DIAGNOSIS — Z5181 Encounter for therapeutic drug level monitoring: Secondary | ICD-10-CM

## 2024-08-29 DIAGNOSIS — I480 Paroxysmal atrial fibrillation: Secondary | ICD-10-CM

## 2024-08-29 DIAGNOSIS — I4891 Unspecified atrial fibrillation: Secondary | ICD-10-CM | POA: Diagnosis not present

## 2024-08-29 DIAGNOSIS — D6869 Other thrombophilia: Secondary | ICD-10-CM

## 2024-08-29 NOTE — Progress Notes (Signed)
 Primary Care Physician: Shayne Anes, MD Primary Cardiologist: Anes Parchment, MD  Electrophysiologist: Lynwood Rakers, MD (Inactive)  Referring Physician: Dr Fernande Asberry Lorraine Little is a 83 y.o. female with a history of CVA, HLD, HTN, OSA, polymyalgia rheumatica, atrial fibrillation who presents for follow up in the [facility]. Patient is s/p two ablations 01/19/16 and 02/16/17. She was started on Multaq  05/2023. Patient is on Eliquis for stroke prevention.  Patient returns for follow up for atrial fibrillation and Multaq  monitoring. She reports that she has done well from an afib standpoint since her last visit. She has only had two brief episodes of afib (Kardia mobile strips personally reviewed). There were no specific triggers that she could identify. Of note, her husband of 64 years passed away 3 weeks ago.   Today, she  denies symptoms of palpitations, chest pain, shortness of breath, orthopnea, PND, lower extremity edema, dizziness, presyncope, syncope, snoring, daytime somnolence, bleeding, or neurologic sequela. The patient is tolerating medications without difficulties and is otherwise without complaint today.    ROS- All systems are reviewed and negative except as per the HPI above.   Atrial Fibrillation Risk Factors:  she does have symptoms or diagnosis of sleep apnea. she does not have a history of rheumatic fever.   Atrial Fibrillation Management history:  Previous antiarrhythmic drugs: amiodarone  (hair loss), flecainide  (dizziness, syncope) Previous cardioversions: 2013 x3, 2017 Previous ablations: 01/19/16, 02/16/17 Anticoagulation history: Xarelto , Eliquis   Physical Exam: BP (!) 152/72   Pulse 71   Ht 5' 2 (1.575 m)   Wt 61.8 kg   BMI 24.91 kg/m   GEN: Well nourished, well developed in no acute distress CARDIAC: Regular rate and rhythm, no murmurs, rubs, gallops RESPIRATORY:  Clear to auscultation without rales, wheezing or rhonchi   ABDOMEN: Soft, non-tender, non-distended EXTREMITIES:  No edema; No deformity    Wt Readings from Last 3 Encounters:  08/29/24 61.8 kg  07/04/24 63.5 kg  05/28/24 61.7 kg     EKG today demonstrates  SR, 1st degree AV block Vent. rate 71 BPM PR interval 234 ms QRS duration 66 ms QT/QTcB 426/462 ms   Echo 08/04/22 demonstrated   1. Left ventricular ejection fraction, by estimation, is 60 to 65%. The  left ventricle has normal function. The left ventricle has no regional  wall motion abnormalities. Left ventricular diastolic parameters are  indeterminate.   2. Right ventricular systolic function is normal. The right ventricular  size is normal. There is mildly elevated pulmonary artery systolic  pressure. The estimated right ventricular systolic pressure is 44.0 mmHg.   3. Left atrial size was moderately dilated.   4. The mitral valve is normal in structure. Mild to moderate mitral valve  regurgitation. No evidence of mitral stenosis.   5. Tricuspid valve regurgitation is moderate.   6. The aortic valve is tricuspid. There is mild calcification of the  aortic valve. There is mild thickening of the aortic valve. Aortic valve  regurgitation is not visualized. Aortic valve sclerosis is present, with  no evidence of aortic valve stenosis.  Aortic valve mean gradient measures 3.0 mmHg. Aortic valve Vmax measures  1.05 m/s.   7. The inferior vena cava is normal in size with greater than 50%  respiratory variability, suggesting right atrial pressure of 3 mmHg.    CHA2DS2-VASc Score = 7  The patient's score is based upon: CHF History: 0 HTN History: 1 Diabetes History: 0 Stroke History: 2 Vascular Disease  History: 1 Age Score: 2 Gender Score: 1       ASSESSMENT AND PLAN: Paroxysmal Atrial Fibrillation (ICD10:  I48.0) The patient's CHA2DS2-VASc score is 7, indicating a 11.2% annual risk of stroke.   Patient appears to be maintaining SR Continue Multaq  400 mg BID Continue  Eliquis 5 mg BID Continue Toprol  25 mg daily  Secondary Hypercoagulable State (ICD10:  D68.69) The patient is at significant risk for stroke/thromboembolism based upon her CHA2DS2-VASc Score of 7.  Continue Rivaroxaban  (Xarelto ). No bleeding issues.   High Risk Medication Monitoring (ICD 10: J342684) Patient requires ongoing monitoring for anti-arrhythmic medication which has the potential to cause life threatening arrhythmias. Intervals on ECG acceptable for dronedarone  monitoring.      OSA  Encouraged nightly CPAP  HTN Stable on current regimen   Follow up in the AF clinic in 6 months.    Daril Kicks PA-C [facility] [address] [place], [place] [address] [phone]

## 2024-09-06 ENCOUNTER — Other Ambulatory Visit: Payer: Self-pay | Admitting: Adult Health

## 2024-09-12 ENCOUNTER — Encounter: Admitting: Neurology

## 2024-12-16 ENCOUNTER — Ambulatory Visit: Admitting: Neurology

## 2024-12-19 ENCOUNTER — Ambulatory Visit: Admitting: Cardiology

## 2025-01-21 ENCOUNTER — Encounter (HOSPITAL_COMMUNITY)

## 2025-02-27 ENCOUNTER — Ambulatory Visit (HOSPITAL_COMMUNITY): Admitting: Physician Assistant

## 2025-05-29 ENCOUNTER — Ambulatory Visit: Admitting: Adult Health
